# Patient Record
Sex: Male | Born: 1960
Health system: Southern US, Community
[De-identification: ages and names within clinical notes are randomized; demographics above are authoritative.]

## PROBLEM LIST (undated history)

## (undated) ENCOUNTER — Emergency Department (HOSPITAL_COMMUNITY): Admission: EM | Payer: Medicare Other | Source: Home / Self Care

## (undated) DIAGNOSIS — Z95810 Presence of automatic (implantable) cardiac defibrillator: Secondary | ICD-10-CM

## (undated) DIAGNOSIS — E785 Hyperlipidemia, unspecified: Secondary | ICD-10-CM

## (undated) DIAGNOSIS — I509 Heart failure, unspecified: Secondary | ICD-10-CM

## (undated) DIAGNOSIS — I213 ST elevation (STEMI) myocardial infarction of unspecified site: Secondary | ICD-10-CM

## (undated) DIAGNOSIS — J449 Chronic obstructive pulmonary disease, unspecified: Secondary | ICD-10-CM

## (undated) DIAGNOSIS — R51 Headache: Secondary | ICD-10-CM

## (undated) DIAGNOSIS — D649 Anemia, unspecified: Secondary | ICD-10-CM

## (undated) DIAGNOSIS — J189 Pneumonia, unspecified organism: Secondary | ICD-10-CM

## (undated) DIAGNOSIS — I255 Ischemic cardiomyopathy: Secondary | ICD-10-CM

## (undated) DIAGNOSIS — Z9981 Dependence on supplemental oxygen: Secondary | ICD-10-CM

## (undated) DIAGNOSIS — C3490 Malignant neoplasm of unspecified part of unspecified bronchus or lung: Secondary | ICD-10-CM

## (undated) DIAGNOSIS — R0602 Shortness of breath: Secondary | ICD-10-CM

## (undated) DIAGNOSIS — I219 Acute myocardial infarction, unspecified: Secondary | ICD-10-CM

## (undated) DIAGNOSIS — C61 Malignant neoplasm of prostate: Secondary | ICD-10-CM

## (undated) DIAGNOSIS — I209 Angina pectoris, unspecified: Secondary | ICD-10-CM

## (undated) DIAGNOSIS — I2510 Atherosclerotic heart disease of native coronary artery without angina pectoris: Secondary | ICD-10-CM

## (undated) DIAGNOSIS — R911 Solitary pulmonary nodule: Secondary | ICD-10-CM

## (undated) DIAGNOSIS — Z923 Personal history of irradiation: Secondary | ICD-10-CM

## (undated) DIAGNOSIS — A159 Respiratory tuberculosis unspecified: Secondary | ICD-10-CM

## (undated) HISTORY — DX: Personal history of irradiation: Z92.3

## (undated) HISTORY — DX: Hyperlipidemia, unspecified: E78.5

## (undated) HISTORY — PX: FRACTURE SURGERY: SHX138

## (undated) HISTORY — DX: Atherosclerotic heart disease of native coronary artery without angina pectoris: I25.10

## (undated) HISTORY — DX: Ischemic cardiomyopathy: I25.5

## (undated) HISTORY — DX: Chronic obstructive pulmonary disease, unspecified: J44.9

---

## 1989-01-28 DIAGNOSIS — J189 Pneumonia, unspecified organism: Secondary | ICD-10-CM

## 1989-01-28 HISTORY — DX: Pneumonia, unspecified organism: J18.9

## 1995-05-31 HISTORY — PX: CORONARY ARTERY BYPASS GRAFT: SHX141

## 2006-05-30 HISTORY — PX: FINGER FRACTURE SURGERY: SHX638

## 2008-05-30 DIAGNOSIS — I219 Acute myocardial infarction, unspecified: Secondary | ICD-10-CM

## 2008-05-30 HISTORY — DX: Acute myocardial infarction, unspecified: I21.9

## 2008-09-27 HISTORY — PX: CORONARY ANGIOPLASTY WITH STENT PLACEMENT: SHX49

## 2008-10-07 ENCOUNTER — Encounter: Payer: Self-pay | Admitting: Cardiology

## 2008-10-08 ENCOUNTER — Encounter: Payer: Self-pay | Admitting: Cardiology

## 2008-10-09 ENCOUNTER — Encounter: Payer: Self-pay | Admitting: Cardiology

## 2010-02-27 DIAGNOSIS — I213 ST elevation (STEMI) myocardial infarction of unspecified site: Secondary | ICD-10-CM

## 2010-02-27 HISTORY — DX: ST elevation (STEMI) myocardial infarction of unspecified site: I21.3

## 2010-02-27 HISTORY — PX: CORONARY ANGIOPLASTY WITH STENT PLACEMENT: SHX49

## 2010-03-13 ENCOUNTER — Encounter: Payer: Self-pay | Admitting: Cardiology

## 2010-03-14 ENCOUNTER — Encounter: Payer: Self-pay | Admitting: Cardiology

## 2010-03-15 ENCOUNTER — Encounter: Payer: Self-pay | Admitting: Cardiology

## 2010-03-16 ENCOUNTER — Encounter: Payer: Self-pay | Admitting: Cardiology

## 2010-03-17 ENCOUNTER — Encounter: Payer: Self-pay | Admitting: Cardiology

## 2011-08-12 ENCOUNTER — Ambulatory Visit (INDEPENDENT_AMBULATORY_CARE_PROVIDER_SITE_OTHER): Payer: Self-pay | Admitting: Cardiology

## 2011-08-12 ENCOUNTER — Encounter: Payer: Self-pay | Admitting: Cardiology

## 2011-08-12 DIAGNOSIS — E7800 Pure hypercholesterolemia, unspecified: Secondary | ICD-10-CM

## 2011-08-12 DIAGNOSIS — I2510 Atherosclerotic heart disease of native coronary artery without angina pectoris: Secondary | ICD-10-CM | POA: Insufficient documentation

## 2011-08-12 DIAGNOSIS — E785 Hyperlipidemia, unspecified: Secondary | ICD-10-CM | POA: Insufficient documentation

## 2011-08-12 DIAGNOSIS — J449 Chronic obstructive pulmonary disease, unspecified: Secondary | ICD-10-CM | POA: Insufficient documentation

## 2011-08-12 DIAGNOSIS — R042 Hemoptysis: Secondary | ICD-10-CM | POA: Insufficient documentation

## 2011-08-12 DIAGNOSIS — K920 Hematemesis: Secondary | ICD-10-CM

## 2011-08-12 DIAGNOSIS — Z720 Tobacco use: Secondary | ICD-10-CM | POA: Insufficient documentation

## 2011-08-12 DIAGNOSIS — I255 Ischemic cardiomyopathy: Secondary | ICD-10-CM | POA: Insufficient documentation

## 2011-08-12 DIAGNOSIS — I428 Other cardiomyopathies: Secondary | ICD-10-CM

## 2011-08-12 DIAGNOSIS — R079 Chest pain, unspecified: Secondary | ICD-10-CM | POA: Insufficient documentation

## 2011-08-12 MED ORDER — CARVEDILOL 3.125 MG PO TABS: 3.1250 mg | ORAL_TABLET | Freq: Two times a day (BID) | ORAL | Status: DC

## 2011-08-12 MED ORDER — LISINOPRIL 2.5 MG PO TABS: 2.5000 mg | ORAL_TABLET | Freq: Every day | ORAL | Status: DC

## 2011-08-12 MED ORDER — PRAVASTATIN SODIUM 40 MG PO TABS: 40.0000 mg | ORAL_TABLET | Freq: Every evening | ORAL | Status: DC

## 2011-08-12 NOTE — Assessment & Plan Note (Signed)
Plan add carvedilol 3.125 mg by mouth twice a day and lisinopril 2.5 mg daily. Check potassium and renal function in one week. Titrate medications as an outpatient. Once fully titrated he will need repeat echocardiogram to reassess LV function. If ejection fraction less than or equal to 35% he would benefit from ICD.

## 2011-08-12 NOTE — Assessment & Plan Note (Signed)
Recent problems with hemoptysis in the morning. Also with COPD and long history of tobacco use. I will arrange for a pulmonary evaluation. Check CBC. Patient will also be referred to a primary care physician.

## 2011-08-12 NOTE — Assessment & Plan Note (Signed)
Add Pravachol and check lipids and liver in 6 weeks.

## 2011-08-12 NOTE — Assessment & Plan Note (Signed)
Patient counseled on discontinuing. 

## 2011-08-12 NOTE — Assessment & Plan Note (Signed)
Continue aspirin and Plavix. Add statin.
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
## 2011-08-12 NOTE — Assessment & Plan Note (Signed)
Will ask pulmonary to evaluate.

## 2011-08-12 NOTE — Assessment & Plan Note (Signed)
Symptoms atypical. Schedule Myoview. 

## 2011-08-12 NOTE — Patient Instructions (Signed)
Your physician recommends that you schedule a follow-up appointment in: 8 WEEKS  Your physician has requested that you have a lexiscan myoview. For further information please visit [email or web address] Please follow instruction sheet, as given.SCHEDULE AT THE HOSP  START CARVEDILOL 3.125 MG ONE TABLET TWICE DAILY  START LISINOPRIL 2.5 MG ONCE DAILY  START PRAVASTATIN 40 MG ONCE DAILY AT BEDTIME  Your physician recommends that you return for lab work in: CBC/BMP IN ONE WEEK  Your physician recommends that you return for lab work in: LIPID/LIVER IN 6 WEEKS  REFERRAL TO PULMONARY FOR COPD AND HEMOPTYSIS  REFERRAL TO PRIMARY CARE TO ESTABLISH

## 2011-08-18 ENCOUNTER — Ambulatory Visit (HOSPITAL_COMMUNITY)
Admission: RE | Admit: 2011-08-18 | Discharge: 2011-08-18 | Disposition: A | Discharge status: Home / Self Care | Payer: Self-pay | Source: Ambulatory Visit | Attending: Cardiology | Admitting: Cardiology

## 2011-08-18 ENCOUNTER — Encounter (HOSPITAL_COMMUNITY)
Admission: RE | Admit: 2011-08-18 | Discharge: 2011-08-18 | Disposition: A | Discharge status: Home / Self Care | Payer: Self-pay | Source: Ambulatory Visit | Attending: Cardiology | Admitting: Cardiology

## 2011-08-18 DIAGNOSIS — I252 Old myocardial infarction: Secondary | ICD-10-CM | POA: Insufficient documentation

## 2011-08-18 DIAGNOSIS — J449 Chronic obstructive pulmonary disease, unspecified: Secondary | ICD-10-CM | POA: Insufficient documentation

## 2011-08-18 DIAGNOSIS — I2510 Atherosclerotic heart disease of native coronary artery without angina pectoris: Secondary | ICD-10-CM | POA: Insufficient documentation

## 2011-08-18 DIAGNOSIS — R0989 Other specified symptoms and signs involving the circulatory and respiratory systems: Secondary | ICD-10-CM | POA: Insufficient documentation

## 2011-08-18 DIAGNOSIS — E785 Hyperlipidemia, unspecified: Secondary | ICD-10-CM | POA: Insufficient documentation

## 2011-08-18 DIAGNOSIS — R079 Chest pain, unspecified: Secondary | ICD-10-CM | POA: Insufficient documentation

## 2011-08-18 DIAGNOSIS — Z951 Presence of aortocoronary bypass graft: Secondary | ICD-10-CM | POA: Insufficient documentation

## 2011-08-18 DIAGNOSIS — Z8249 Family history of ischemic heart disease and other diseases of the circulatory system: Secondary | ICD-10-CM | POA: Insufficient documentation

## 2011-08-18 DIAGNOSIS — R0609 Other forms of dyspnea: Secondary | ICD-10-CM | POA: Insufficient documentation

## 2011-08-18 DIAGNOSIS — F17200 Nicotine dependence, unspecified, uncomplicated: Secondary | ICD-10-CM | POA: Insufficient documentation

## 2011-08-18 DIAGNOSIS — R0602 Shortness of breath: Secondary | ICD-10-CM | POA: Insufficient documentation

## 2011-08-18 DIAGNOSIS — Z87891 Personal history of nicotine dependence: Secondary | ICD-10-CM | POA: Insufficient documentation

## 2011-08-18 DIAGNOSIS — J4489 Other specified chronic obstructive pulmonary disease: Secondary | ICD-10-CM | POA: Insufficient documentation

## 2011-08-18 DIAGNOSIS — I2589 Other forms of chronic ischemic heart disease: Secondary | ICD-10-CM | POA: Insufficient documentation

## 2011-08-18 LAB — BASIC METABOLIC PANEL
BUN: 8 mg/dL (ref 6–23)
CO2: 28 mEq/L (ref 19–32)
Chloride: 100 mEq/L (ref 96–112)
Glucose, Bld: 155 mg/dL — ABNORMAL HIGH (ref 70–99)
Potassium: 3.6 mEq/L (ref 3.5–5.1)

## 2011-08-18 LAB — CBC
HCT: 29.7 % — ABNORMAL LOW (ref 39.0–52.0)
Hemoglobin: 10.1 g/dL — ABNORMAL LOW (ref 13.0–17.0)
MCH: 22.8 pg — ABNORMAL LOW (ref 26.0–34.0)
MCHC: 34 g/dL (ref 30.0–36.0)
RDW: 17.6 % — ABNORMAL HIGH (ref 11.5–15.5)

## 2011-08-18 LAB — DIFFERENTIAL
Basophils Absolute: 0 10*3/uL (ref 0.0–0.1)
Basophils Relative: 0 % (ref 0–1)
Eosinophils Absolute: 0.4 10*3/uL (ref 0.0–0.7)
Eosinophils Relative: 3 % (ref 0–5)
Monocytes Absolute: 0.7 10*3/uL (ref 0.1–1.0)
Monocytes Relative: 5 % (ref 3–12)

## 2011-08-18 MED ORDER — TECHNETIUM TC 99M TETROFOSMIN IV KIT
30.0000 | PACK | Freq: Once | INTRAVENOUS | Status: AC | PRN
  Administered 2011-08-18: 30 via INTRAVENOUS

## 2011-08-18 MED ORDER — TECHNETIUM TC 99M TETROFOSMIN IV KIT
10.0000 | PACK | Freq: Once | INTRAVENOUS | Status: AC | PRN
  Administered 2011-08-18: 10 via INTRAVENOUS

## 2011-08-18 MED ORDER — REGADENOSON 0.4 MG/5ML IV SOLN
INTRAVENOUS | Status: AC
  Filled 2011-08-18: qty 5

## 2011-08-18 MED ORDER — REGADENOSON 0.4 MG/5ML IV SOLN
0.4000 mg | Freq: Once | INTRAVENOUS | Status: AC
  Administered 2011-08-18: 0.4 mg via INTRAVENOUS

## 2011-10-03 ENCOUNTER — Encounter: Payer: Self-pay | Admitting: Cardiology
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
## 2011-10-06 ENCOUNTER — Encounter: Payer: Self-pay | Admitting: Cardiology

## 2011-10-27 ENCOUNTER — Other Ambulatory Visit: Payer: Self-pay | Admitting: Cardiothoracic Surgery

## 2011-10-27 ENCOUNTER — Ambulatory Visit
Admission: RE | Admit: 2011-10-27 | Discharge: 2011-10-27 | Disposition: A | Discharge status: Home / Self Care | Payer: No Typology Code available for payment source | Source: Ambulatory Visit | Attending: Cardiology | Admitting: Cardiology

## 2011-10-27 ENCOUNTER — Ambulatory Visit (INDEPENDENT_AMBULATORY_CARE_PROVIDER_SITE_OTHER): Payer: No Typology Code available for payment source | Admitting: Cardiology

## 2011-10-27 ENCOUNTER — Other Ambulatory Visit: Payer: Self-pay | Admitting: *Deleted

## 2011-10-27 ENCOUNTER — Ambulatory Visit (INDEPENDENT_AMBULATORY_CARE_PROVIDER_SITE_OTHER): Payer: Self-pay | Admitting: Cardiothoracic Surgery

## 2011-10-27 ENCOUNTER — Encounter: Payer: Self-pay | Admitting: Cardiology

## 2011-10-27 ENCOUNTER — Inpatient Hospital Stay (HOSPITAL_COMMUNITY)
Admission: AD | Admit: 2011-10-27 | Discharge: 2011-11-03 | DRG: 167 | Disposition: A | Discharge status: Home / Self Care | Payer: MEDICAID | Source: Ambulatory Visit | Attending: Critical Care Medicine | Admitting: Critical Care Medicine

## 2011-10-27 ENCOUNTER — Inpatient Hospital Stay (HOSPITAL_COMMUNITY): Payer: Self-pay

## 2011-10-27 ENCOUNTER — Encounter (HOSPITAL_COMMUNITY): Payer: Self-pay | Admitting: General Practice

## 2011-10-27 VITALS — BP 88/63 | HR 100 | Resp 18 | Ht 67.0 in | Wt 120.0 lb

## 2011-10-27 VITALS — BP 90/62 | HR 117 | Temp 98.8°F | Ht 67.0 in | Wt 120.0 lb

## 2011-10-27 DIAGNOSIS — J449 Chronic obstructive pulmonary disease, unspecified: Secondary | ICD-10-CM | POA: Diagnosis present

## 2011-10-27 DIAGNOSIS — L089 Local infection of the skin and subcutaneous tissue, unspecified: Secondary | ICD-10-CM | POA: Insufficient documentation

## 2011-10-27 DIAGNOSIS — J850 Gangrene and necrosis of lung: Secondary | ICD-10-CM

## 2011-10-27 DIAGNOSIS — S298XXA Other specified injuries of thorax, initial encounter: Secondary | ICD-10-CM

## 2011-10-27 DIAGNOSIS — I2589 Other forms of chronic ischemic heart disease: Secondary | ICD-10-CM | POA: Diagnosis present

## 2011-10-27 DIAGNOSIS — I2510 Atherosclerotic heart disease of native coronary artery without angina pectoris: Secondary | ICD-10-CM

## 2011-10-27 DIAGNOSIS — I255 Ischemic cardiomyopathy: Secondary | ICD-10-CM | POA: Diagnosis present

## 2011-10-27 DIAGNOSIS — IMO0002 Reserved for concepts with insufficient information to code with codable children: Secondary | ICD-10-CM

## 2011-10-27 DIAGNOSIS — J439 Emphysema, unspecified: Secondary | ICD-10-CM | POA: Diagnosis present

## 2011-10-27 DIAGNOSIS — L02219 Cutaneous abscess of trunk, unspecified: Secondary | ICD-10-CM | POA: Diagnosis present

## 2011-10-27 DIAGNOSIS — L03319 Cellulitis of trunk, unspecified: Secondary | ICD-10-CM

## 2011-10-27 DIAGNOSIS — S21101A Unspecified open wound of right front wall of thorax without penetration into thoracic cavity, initial encounter: Secondary | ICD-10-CM

## 2011-10-27 DIAGNOSIS — J852 Abscess of lung without pneumonia: Principal | ICD-10-CM | POA: Diagnosis present

## 2011-10-27 DIAGNOSIS — D539 Nutritional anemia, unspecified: Secondary | ICD-10-CM

## 2011-10-27 DIAGNOSIS — L0390 Cellulitis, unspecified: Secondary | ICD-10-CM | POA: Diagnosis present

## 2011-10-27 DIAGNOSIS — T07XXXA Unspecified multiple injuries, initial encounter: Secondary | ICD-10-CM

## 2011-10-27 DIAGNOSIS — I428 Other cardiomyopathies: Secondary | ICD-10-CM | POA: Diagnosis present

## 2011-10-27 DIAGNOSIS — Z951 Presence of aortocoronary bypass graft: Secondary | ICD-10-CM

## 2011-10-27 DIAGNOSIS — J4489 Other specified chronic obstructive pulmonary disease: Secondary | ICD-10-CM | POA: Diagnosis present

## 2011-10-27 DIAGNOSIS — R918 Other nonspecific abnormal finding of lung field: Secondary | ICD-10-CM | POA: Diagnosis present

## 2011-10-27 DIAGNOSIS — F17200 Nicotine dependence, unspecified, uncomplicated: Secondary | ICD-10-CM | POA: Diagnosis present

## 2011-10-27 DIAGNOSIS — Z8611 Personal history of tuberculosis: Secondary | ICD-10-CM | POA: Diagnosis present

## 2011-10-27 DIAGNOSIS — D509 Iron deficiency anemia, unspecified: Secondary | ICD-10-CM | POA: Insufficient documentation

## 2011-10-27 DIAGNOSIS — L98499 Non-pressure chronic ulcer of skin of other sites with unspecified severity: Secondary | ICD-10-CM | POA: Diagnosis present

## 2011-10-27 DIAGNOSIS — Z720 Tobacco use: Secondary | ICD-10-CM | POA: Diagnosis present

## 2011-10-27 DIAGNOSIS — R590 Localized enlarged lymph nodes: Secondary | ICD-10-CM | POA: Diagnosis present

## 2011-10-27 DIAGNOSIS — I429 Cardiomyopathy, unspecified: Secondary | ICD-10-CM

## 2011-10-27 DIAGNOSIS — R7611 Nonspecific reaction to tuberculin skin test without active tuberculosis: Secondary | ICD-10-CM

## 2011-10-27 HISTORY — DX: Acute myocardial infarction, unspecified: I21.9

## 2011-10-27 HISTORY — DX: Anemia, unspecified: D64.9

## 2011-10-27 HISTORY — DX: Respiratory tuberculosis unspecified: A15.9

## 2011-10-27 HISTORY — DX: Pneumonia, unspecified organism: J18.9

## 2011-10-27 HISTORY — DX: ST elevation (STEMI) myocardial infarction of unspecified site: I21.3

## 2011-10-27 HISTORY — DX: Angina pectoris, unspecified: I20.9

## 2011-10-27 LAB — BASIC METABOLIC PANEL
Calcium: 8.8 mg/dL (ref 8.4–10.5)
GFR calc Af Amer: >90 mL/min (ref >90)
GFR calc non Af Amer: >90 mL/min (ref >90)
Glucose, Bld: 148 mg/dL — ABNORMAL HIGH (ref 70–99)
Sodium: 135 mEq/L (ref 135–145)

## 2011-10-27 LAB — CBC
Hemoglobin: 8.4 g/dL — ABNORMAL LOW (ref 13.0–17.0)
MCH: 21.7 pg — ABNORMAL LOW (ref 26.0–34.0)
MCHC: 33.9 g/dL (ref 30.0–36.0)
Platelets: 660 10*3/uL — ABNORMAL HIGH (ref 150–400)
RDW: 18.5 % — ABNORMAL HIGH (ref 11.5–15.5)

## 2011-10-27 MED ORDER — CEFEPIME HCL 1 G IJ SOLR
1.0000 g | Freq: Two times a day (BID) | INTRAMUSCULAR | Status: DC
  Administered 2011-10-27 – 2011-10-30 (×7): 1 g via INTRAVENOUS
  Filled 2011-10-27 (×10): qty 1

## 2011-10-27 MED ORDER — PANTOPRAZOLE SODIUM 40 MG PO TBEC
40.0000 mg | DELAYED_RELEASE_TABLET | Freq: Every day | ORAL | Status: DC
  Administered 2011-10-27 – 2011-11-03 (×7): 40 mg via ORAL
  Filled 2011-10-27 (×7): qty 1

## 2011-10-27 MED ORDER — VANCOMYCIN HCL 500 MG IV SOLR
500.0000 mg | Freq: Three times a day (TID) | INTRAVENOUS | Status: DC
  Administered 2011-10-28 – 2011-10-29 (×4): 500 mg via INTRAVENOUS
  Filled 2011-10-27 (×7): qty 500

## 2011-10-27 MED ORDER — VANCOMYCIN HCL IN DEXTROSE 1-5 GM/200ML-% IV SOLN
1000.0000 mg | Freq: Once | INTRAVENOUS | Status: AC
  Administered 2011-10-27: 1000 mg via INTRAVENOUS
  Filled 2011-10-27: qty 200

## 2011-10-27 MED ORDER — CARVEDILOL 3.125 MG PO TABS: 3.1250 mg | ORAL_TABLET | Freq: Two times a day (BID) | ORAL | Status: DC

## 2011-10-27 MED ORDER — TRAMADOL HCL 50 MG PO TABS
50.0000 mg | ORAL_TABLET | Freq: Four times a day (QID) | ORAL | Status: DC | PRN
  Filled 2011-10-27: qty 1

## 2011-10-27 MED ORDER — SIMVASTATIN 40 MG PO TABS: 40.0000 mg | ORAL_TABLET | Freq: Every day | ORAL | Status: DC

## 2011-10-27 MED ORDER — HEPARIN SODIUM (PORCINE) 5000 UNIT/ML IJ SOLN
5000.0000 [IU] | Freq: Three times a day (TID) | INTRAMUSCULAR | Status: DC
  Administered 2011-10-27 – 2011-10-31 (×11): 5000 [IU] via SUBCUTANEOUS
  Filled 2011-10-27 (×15): qty 1

## 2011-10-27 MED ORDER — TUBERCULIN PPD 5 UNIT/0.1ML ID SOLN
5.0000 [IU] | Freq: Once | INTRADERMAL | Status: AC
  Administered 2011-10-27: 5 [IU] via INTRADERMAL
  Filled 2011-10-27: qty 0.1

## 2011-10-27 MED ORDER — POTASSIUM CHLORIDE CRYS ER 20 MEQ PO TBCR
20.0000 meq | EXTENDED_RELEASE_TABLET | Freq: Once | ORAL | Status: AC
  Administered 2011-10-27: 20 meq via ORAL
  Filled 2011-10-27: qty 1

## 2011-10-27 MED ORDER — NICOTINE 7 MG/24HR TD PT24
7.0000 mg | MEDICATED_PATCH | Freq: Every day | TRANSDERMAL | Status: DC
  Administered 2011-10-27 – 2011-11-02 (×6): 7 mg via TRANSDERMAL
  Filled 2011-10-27 (×7): qty 1

## 2011-10-27 MED ORDER — ACETAMINOPHEN 325 MG PO TABS: 650.0000 mg | ORAL_TABLET | Freq: Four times a day (QID) | ORAL | Status: DC | PRN

## 2011-10-27 MED ORDER — IOHEXOL 300 MG/ML  SOLN
80.0000 mL | Freq: Once | INTRAMUSCULAR | Status: AC | PRN
  Administered 2011-10-27: 80 mL via INTRAVENOUS

## 2011-10-27 NOTE — Patient Instructions (Addendum)
Your physician recommends that you schedule a follow-up appointment in: 4 WEEKS WITH DR CRENSHAW  A chest x-ray takes a picture of the organs and structures inside the chest, including the heart, lungs, and blood vessels. This test can show several things, including, whether the heart is enlarges; whether fluid is building up in the lungs; and whether pacemaker / defibrillator leads are still in place. [place] IMAGING=1ST FLOOR  Your physician recommends that you HAVE LAB WORK TODAY  REFERRAL TO GI FOR MACROCYTIC ANEMIA  RESTART SIMVASTATIN 40 MG ONCE DAILY  RESTART CARVEDILOL 3.125 MG TWICE DAILY  REFERRAL TO [facility] TO [facility]LISH  GO OVER TO [facility] TO SEE DR ZOXWRUEA AFTER HAVING CXR=4TH FLOOR   REFERRAL TO PULMONARY FOR HEMOPYSIS

## 2011-10-27 NOTE — Assessment & Plan Note (Signed)
Patient counseled on discontinuing. 

## 2011-10-27 NOTE — Assessment & Plan Note (Signed)
Patient was noted previously to have a microcytic anemia. He has not followed up with a primary care physician. I will arrange a GI evaluation. I will try and arrange a new patient evaluation with [facility] for [facility] primary care needs.
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
## 2011-10-27 NOTE — Assessment & Plan Note (Signed)
Patient appears to have a small abscess lateral to his sternotomy as well as drainage from the superior aspect of the sternotomy. He denies fevers or chills and is not febrile in the office. Check CBC. Discussed with Dr. Tyrone Sage who will see today. Will check chest x-ray for stability of sternotomy.

## 2011-10-27 NOTE — Assessment & Plan Note (Signed)
Patient occasionally has blood streaked sputum. Will arrange evaluation with pulmonary.
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
## 2011-10-27 NOTE — Assessment & Plan Note (Signed)
We will add low-dose Coreg. We'll try and add low-dose ACE inhibitor in the future if his blood pressure allows. He may need an ICD in the future depending on followup EF. We would need to obviously treat his infection first.

## 2011-10-27 NOTE — Assessment & Plan Note (Signed)
Add Zocor 40 mg daily. Lipids and liver in 6 weeks.

## 2011-10-27 NOTE — Assessment & Plan Note (Signed)
Patient denies chest pain. Recent Myoview showed infarct with mild peri-infarct ischemia. Plan medical therapy. Continue aspirin. He is not taking previous medications. Will add Zocor 40 mg daily. Add Coreg 3.125 mg by mouth twice a day. We may be limited in terms of medical therapy as his blood pressure is borderline. We will add an ACE inhibitor in the future if his blood pressure allows.
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
## 2011-10-28 DIAGNOSIS — I2589 Other forms of chronic ischemic heart disease: Secondary | ICD-10-CM

## 2011-10-28 DIAGNOSIS — I2510 Atherosclerotic heart disease of native coronary artery without angina pectoris: Secondary | ICD-10-CM

## 2011-10-28 DIAGNOSIS — R599 Enlarged lymph nodes, unspecified: Secondary | ICD-10-CM

## 2011-10-28 DIAGNOSIS — R590 Localized enlarged lymph nodes: Secondary | ICD-10-CM | POA: Diagnosis present

## 2011-10-28 DIAGNOSIS — R042 Hemoptysis: Secondary | ICD-10-CM

## 2011-10-28 DIAGNOSIS — R918 Other nonspecific abnormal finding of lung field: Secondary | ICD-10-CM | POA: Diagnosis present

## 2011-10-28 DIAGNOSIS — R222 Localized swelling, mass and lump, trunk: Secondary | ICD-10-CM

## 2011-10-28 DIAGNOSIS — D509 Iron deficiency anemia, unspecified: Secondary | ICD-10-CM

## 2011-10-28 DIAGNOSIS — F17200 Nicotine dependence, unspecified, uncomplicated: Secondary | ICD-10-CM | POA: Diagnosis present

## 2011-10-28 DIAGNOSIS — L0291 Cutaneous abscess, unspecified: Secondary | ICD-10-CM

## 2011-10-28 LAB — FOLATE: Folate: 5.4 ng/mL

## 2011-10-28 LAB — HIV ANTIBODY (ROUTINE TESTING W REFLEX): HIV: NONREACTIVE

## 2011-10-28 LAB — STREP PNEUMONIAE URINARY ANTIGEN: Strep Pneumo Urinary Antigen: NEGATIVE

## 2011-10-28 LAB — IRON AND TIBC: UIBC: 125 ug/dL (ref 125–400)

## 2011-10-28 LAB — RETICULOCYTES
RBC.: 3.9 MIL/uL — ABNORMAL LOW (ref 4.22–5.81)
Retic Count, Absolute: 58.5 10*3/uL (ref 19.0–186.0)

## 2011-10-28 LAB — EXPECTORATED SPUTUM ASSESSMENT W GRAM STAIN, RFLX TO RESP C

## 2011-10-28 LAB — FERRITIN: Ferritin: 189 ng/mL (ref 22–322)

## 2011-10-28 MED ORDER — CARVEDILOL 3.125 MG PO TABS
3.1250 mg | ORAL_TABLET | Freq: Two times a day (BID) | ORAL | Status: DC
  Administered 2011-10-28 – 2011-10-31 (×8): 3.125 mg via ORAL
  Filled 2011-10-28 (×10): qty 1

## 2011-10-28 MED ORDER — SIMVASTATIN 40 MG PO TABS
40.0000 mg | ORAL_TABLET | Freq: Every day | ORAL | Status: DC
  Administered 2011-10-28 – 2011-11-02 (×6): 40 mg via ORAL
  Filled 2011-10-28 (×7): qty 1

## 2011-10-28 MED ORDER — ASPIRIN 81 MG PO CHEW
81.0000 mg | CHEWABLE_TABLET | Freq: Every day | ORAL | Status: DC
  Administered 2011-10-28 – 2011-11-03 (×6): 81 mg via ORAL
  Filled 2011-10-28 (×6): qty 1
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
## 2011-10-28 NOTE — Progress Notes (Signed)
[address]            [place] [address]          [phone]       [name] [facility] Medical Record #[id number] Date of Birth: [birth year]  Garrett Bunting, MD No primary provider on file.  Chief Complaint:   PostOp Follow Up Visit   History of Present Illness:     S/p cabg seen at request of Dr Jens Som for trauma to old sternotomy site. Patient has no way to purchase meds , so was admitted for iv antibiotic is and ct scan         History  Smoking status  . Current Everyday Smoker -- 0.1 packs/day for 34 years  . Types: Cigarettes  Smokeless tobacco  . Never Used       No Known Allergies  No current facility-administered medications for this visit.   No current outpatient prescriptions on file.   Facility-Administered Medications Ordered in Other Visits  Medication Dose Route Frequency Provider Last Rate Last Dose  . acetaminophen (TYLENOL) tablet 650 mg  650 mg Oral Q6H PRN Erin R Barrett, PA      . aspirin chewable tablet 81 mg  81 mg Oral Daily Garrett Bunting, MD   81 mg at 10/28/11 1048  . carvedilol (COREG) tablet 3.125 mg  3.125 mg Oral BID WC Garrett Bunting, MD   3.125 mg at 10/28/11 0839  . ceFEPIme (MAXIPIME) 1 g in dextrose 5 % 50 mL IVPB  1 g Intravenous Q12H [name], PHARMD   1 g at 10/28/11 1048  . heparin injection 5,000 Units  5,000 Units Subcutaneous Q8H [name], MD   5,000 Units at 10/28/11 1426  . iohexol (OMNIPAQUE) 300 MG/ML solution 80 mL  80 mL Intravenous Once PRN Medication Radiologist, MD   80 mL at 10/27/11 2040  . nicotine (NICODERM CQ - dosed in mg/24 hr) patch 7 mg  7 mg Transdermal Daily Erin R Barrett, PA   7 mg at 10/28/11 1049  . pantoprazole (PROTONIX) EC tablet 40 mg  40 mg Oral Q1200 [name], MD   40 mg at 10/28/11 1123  . potassium chloride SA (K-DUR,KLOR-CON) CR tablet 20 mEq  20 mEq Oral Once [name], MD   20 mEq at 10/27/11  2059  . simvastatin (ZOCOR) tablet 40 mg  40 mg Oral q1800 Garrett Bunting, MD      . traMADol [name]) tablet 50 mg  50 mg Oral Q6H PRN Erin R Barrett, PA      . tuberculin injection 5 Units  5 Units Intradermal Once [name], MD   5 Units at 10/27/11 2109  . vancomycin (VANCOCIN) 500 mg in sodium chloride 0.9 % 100 mL IVPB  500 mg Intravenous Q8H [name], PHARMD   500 mg at 10/28/11 1244  . vancomycin (VANCOCIN) IVPB 1000 mg/200 mL premix  1,000 mg Intravenous Once [employer], PHARMD   1,000 mg at 10/27/11 2132       Physical Exam: BP 88/63  Pulse 100  Resp 18  Ht 5\' 7"  (1.702 m)  Wt 120 lb (54.432 kg)  BMI 18.79 kg/m2  SpO2 98%  General appearance: cachectic, distracted and fatigued Neurologic: intact Heart: regular rate and rhythm, S1, S2 normal, no murmur,  click, rub or gallop Lungs: diminished breath sounds bilaterally Abdomen: soft, non-tender; bowel sounds normal; no masses,  no organomegaly Extremities: extremities normal, atraumatic, no cyanosis or edema Wounds:area  Left upper chest tender swollen with superficial break down of upper sternal wound  Diagnostic Studies & Laboratory data:         Recent Radiology Findings: Dg Chest 2 View  10/27/2011  *RADIOLOGY REPORT*  Clinical Data: Chest infection  CHEST - 2 VIEW  Comparison: None.  Findings:  The left heart border is obscured secondary to left upper and mid lung heterogeneous / consolidative opacities. Post coronary artery stenting.  There is left-sided volume loss with elevation of the left hemidiaphragm.  Left apical pleuroparenchymal thickening.  Ill- defined heterogeneous opacities are also seen within the right upper lung.  No definite pleural effusion or pneumothorax. Mild scoliotic curvature of the thoracolumbar spine, possibly positional.  IMPRESSION: Extensive left mid and upper lung predominant heterogeneous / consolidative opacities and volume loss with  additional opacities within the right upper lung.  In the absence of priors, the stability of these findings is indeterminate and an acute infectious process is not excluded.  Chronic etiologies such as sarcoidosis may have a similar appearance, though an underlying mass is not excluded.  Comparison to prior examinations is recommended.  If no comparisons exist, further evaluation with chest CT may be performed as clinically indicated.  Original Report Authenticated By: Waynard Reeds, M.D.   Ct Chest W Contrast  10/27/2011  *RADIOLOGY REPORT*  Clinical Data: Opening of previous sternotomy after being hit with baseball 3 weeks ago.  Soreness over the sternal area.  History of TB as a child.  CT CHEST WITH CONTRAST  Technique:  Multidetector CT imaging of the chest was performed following the standard protocol during bolus administration of intravenous contrast.  Contrast: 80mL OMNIPAQUE IOHEXOL 300 MG/ML  SOLN  Comparison: Chest radiograph 10/27/2011  Findings: Normal heart size.  Normal caliber thoracic aorta with mild calcification.  There are postoperative changes of median sternotomy.  There is increased density along what appears to be a left internal mammary graft which might represent calcification of the graft or stent.  There is calcification focally along the left heart border which might represent pericardial calcification or pleural calcification.  There is moderate lymphadenopathy in the anterior mediastinum and left aortopulmonic window, with lymph nodes measuring up to 15 mm short axis dimension. Mild left axillary lymphadenopathy.  The esophagus is decompressed.  There is volume loss in the left lung with diffuse left pleural thickening and near complete collapse or consolidation of the left lung with multiple air bronchograms and cystic changes.  There is aeration of a portion of the lung base with scarring.  These changes might reflect sequelae of old TB but active inflammation is not excluded  and correlation with any old outside studies would be useful.  There is compensatory hyperinflation of the right lung with diffuse emphysema and bullous changes in the apices.  There is focal mass-like area of consolidation in the posterior aspect of the right upper lung measuring about 3.5 x 3.8 cm.  This could represent mass or inflammatory process.  No pleural effusions.  No pneumothorax.  The sternum appears intact without evidence of acute fracture or bone destruction.  No significant retrosternal fluid or thickening. Mild skin thickening superficial to the sternum which might represent a hematoma were edema.  IMPRESSION: Extensive consolidation and volume loss throughout the left lung with apparent associated pleural thickening.  Left mediastinal lymphadenopathy.  Focal area of mass-like infiltration in the right upper lung.  The emphysematous changes.  Changes may be related to old TB but acute inflammatory or neoplastic process should be excluded.  Correlation with any old outside studies would be useful.  Otherwise, reactivation TB should be excluded.  Follow up after resolution of acute symptoms would be useful to exclude underlying mass lesion.  No retrosternal abscess or sternal bone destruction.  Original Report Authenticated By: Marlon Pel, M.D.      Recent Labs: Lab Results  Component Value Date   WBC 13.3* 10/27/2011   HGB 8.4* 10/27/2011   HCT 24.8* 10/27/2011   PLT 660* 10/27/2011   GLUCOSE 148* 10/27/2011   NA 135 10/27/2011   K 3.4* 10/27/2011   CL 99 10/27/2011   CREATININE 0.54 10/27/2011   BUN 11 10/27/2011   CO2 26 10/27/2011      Assessment / Plan:     See h& P dictation       Delight Ovens MD 10/28/2011 3:44 PM

## 2011-10-28 NOTE — Progress Notes (Signed)
@   Subjective:  Denies CP or dyspnea; chest "sore" at drainage site.   Objective:  Filed Vitals:   10/27/11 2325 10/28/11 0153 10/28/11 0359 10/28/11 0624  BP: 122/82 94/62 90/56    Pulse: 122 107 97 87  Temp: 99.4 F (37.4 C) 100.4 F (38 C) 98.5 F (36.9 C)   TempSrc: Oral Oral Oral   Resp: 20 18 18 20   Height:      Weight:      SpO2: 96% 95% 94% 98%    Intake/Output from previous day:  Intake/Output Summary (Last 24 hours) at 10/28/11 0700 Last data filed at 10/28/11 0600  Gross per 24 hour  Intake   1310 ml  Output    875 ml  Net    435 ml    Physical Exam: Physical exam: Well-developed thin in no acute distress.  Skin is warm and dry.  HEENT is normal.  Neck is supple.  Chest is clear to auscultation with normal expansion. Sternum with cellulitis Cardiovascular exam is regular rate and rhythm.  Abdominal exam nontender or distended. No masses palpated. Extremities show no edema. neuro grossly intact    Lab Results: Basic Metabolic Panel:  Basename 10/27/11 1732  NA 135  K 3.4*  CL 99  CO2 26  GLUCOSE 148*  BUN 11  CREATININE 0.54  CALCIUM 8.8  MG --  PHOS --   CBC:  Basename 10/27/11 1732  WBC 13.3*  NEUTROABS --  HGB 8.4*  HCT 24.8*  MCV 64.1*  PLT 660*     Assessment/Plan:  1) ICM- add ASA, statin and low dose coreg; will add ACEI later if BP allows (could be done as outpt). Will consider ICD in the future once lung/ID issues addressed and treated. 2) Sternal cellulitis - continue antibiotics 3) Pulmonary infiltrates - etiology unclear; continue antibiotics; pulmonary following; may need ID consult. 4) Severe microcytic anemia - check Fe studies and stool guaiac; will most likely need GI eval. Continue protonix. Patient will need close FU with a primary care physician following DC  Olga Millers 10/28/2011, 7:00 AM

## 2011-10-28 NOTE — Progress Notes (Addendum)
  Subjective:  Mr. Petta has no new complaints this morning.  He was able to produce a sputum sample this morning, which was acceptable for culture.   Objective: Vital signs in last 24 hours: Temp:  [98.2 F (36.8 C)-100.4 F (38 C)] 98.5 F (36.9 C) (05/31 0359) Pulse Rate:  [87-122] 87  (05/31 0624) Cardiac Rhythm:  [-] Normal sinus rhythm (05/31 0736) Resp:  [18-20] 20  (05/31 0624) BP: (88-122)/(56-82) 90/56 mmHg (05/31 0359) SpO2:  [90 %-98 %] 98 % (05/31 0624) FiO2 (%):  [2 %] 2 % (05/31 0153) Weight:  [119 lb 14.9 oz (54.4 kg)-120 lb (54.432 kg)] 119 lb 14.9 oz (54.4 kg) (05/30 1618) Intake/Output from previous day: 05/30 0701 - 05/31 0700 In: 1310 [P.O.:960; IV Piggyback:350] Out: 1150 [Urine:1150]  General appearance: alert, cooperative and no distress Heart: regular rate and rhythm, S1, S2 normal, no murmur, click, rub or gallop Lungs: clear to auscultation bilaterally Abdomen: soft, non-tender; bowel sounds normal; no masses,  no organomegaly Wound: small 2cm opening mid sternotomy incision, swollen, drainage present  Lab Results:  Basename 10/27/11 1732  WBC 13.3*  HGB 8.4*  HCT 24.8*  PLT 660*   BMET:  Basename 10/27/11 1732  NA 135  K 3.4*  CL 99  CO2 26  GLUCOSE 148*  BUN 11  CREATININE 0.54  CALCIUM 8.8    PT/INR: No results found for this basename: LABPROT,INR in the last 72 hours ABG No results found for this basename: phart, pco2, po2, hco3, tco2, acidbasedef, o2sat   CBG (last 3)  No results found for this basename: GLUCAP:3 in the last 72 hours  Assessment/Plan:  1.Chest cellulitis- on Vanc and Cefepime 2. Volume Loss and Pulmonary Infiltrate per CT scan- no identifiable source- appreciate pulmonary recommendations 3. Microcytic Anemia- hgb 8.4 this morning, will monitor patient with hemoptysis, CCM ordered occult blood checks 4. Dispo- will continue workup per CCM.  Will discuss with Dr. Tyrone Sage about transferring patient to CCM  service   LOS: 1 day    Lowella Dandy 10/28/2011  Have contacted ID and GI to see today Ok for CCM to assume on there service I have seen and examined Dustin Flock and agree with the above assessment  and plan.  Delight Ovens MD Beeper [phone] Office [phone] 10/28/2011 9:33 AM

## 2011-10-28 NOTE — Care Management Note (Unsigned)
    Page 1 of 1   10/28/2011     3:04:45 PM   CARE MANAGEMENT NOTE 10/28/2011  Patient:  [name], [name]   Account Number:  [id number]  Date Initiated:  10/28/2011  Documentation initiated by:  Ryatt Corsino  Subjective/Objective Assessment:   PT ADM WITH STERNAL CELLULITIS, PULMONARY INFILTRATE ON DROPLET PRECAUTIONS (R/O TB).  PTA, PT INDEPENDENT, FROM HOME.     Action/Plan:   CM REFERRAL FOR FINANCIAL ISSUES/MEDICATION CONCERNS   Anticipated DC Date:  11/01/2011   Anticipated DC Plan:  HOME W HOME HEALTH SERVICES      DC Planning Services  CM consult  [facility] / [facility] (established/new)      Choice offered to / List presented to:             Status of service:   Medicare Important Message given?   (If response is "NO", the following Medicare IM given date fields will be blank) Date Medicare IM given:   Date Additional Medicare IM given:    Discharge Disposition:    Per UR Regulation:    If discussed at Long Length of Stay Meetings, dates discussed:    Comments:  10/28/11 Thalya Fouche,RN,BSN 1340 PT STATES HE HAS BEEN OUT OF WORK FOR ABOUT 3 OR 4 YEARS. HE HAS APPLIED FOR MEDICAID, BUT HAS BEEN TURNED DOWN.  HE HAS NOT REAPPLIED.  HE STATES HE DOES NOT HAVE A PCP, AND HAS TROUBLE GETTING HIS MEDS FILLED.    WILL CONTACT FINANCIAL COUNSELOR TO EVALUATE/ASSIST WITH DISABILITY APPLICATION.  LEFT MESSAGE FOR DOROTHY TIMMONS WITH [facility] COMMUNITY CASE MANAGEMENT TO SEE IF THIS AGENCY CAN ASSIST PT WITH PCP/OBTAINING ORANGE CARD FOR HELP WITH RX.  WILL FOLLOW.

## 2011-10-29 LAB — ANGIOTENSIN CONVERTING ENZYME: Angiotensin-Converting Enzyme: 42 U/L (ref 8–52)

## 2011-10-29 LAB — BASIC METABOLIC PANEL
CO2: 25 mEq/L (ref 19–32)
Calcium: 9.3 mg/dL (ref 8.4–10.5)
Creatinine, Ser: 0.61 mg/dL (ref 0.50–1.35)
GFR calc non Af Amer: >90 mL/min (ref >90)
Glucose, Bld: 112 mg/dL — ABNORMAL HIGH (ref 70–99)

## 2011-10-29 LAB — CBC
MCH: 20.5 pg — ABNORMAL LOW (ref 26.0–34.0)
MCHC: 31.6 g/dL (ref 30.0–36.0)
MCV: 64.8 fL — ABNORMAL LOW (ref 78.0–100.0)
Platelets: 690 10*3/uL — ABNORMAL HIGH (ref 150–400)
RBC: 3.95 MIL/uL — ABNORMAL LOW (ref 4.22–5.81)

## 2011-10-29 MED ORDER — VANCOMYCIN HCL 1000 MG IV SOLR
750.0000 mg | Freq: Three times a day (TID) | INTRAVENOUS | Status: DC
  Administered 2011-10-29 – 2011-11-02 (×11): 750 mg via INTRAVENOUS
  Filled 2011-10-29 (×15): qty 750

## 2011-10-29 NOTE — Progress Notes (Signed)
Procedure(s) (LRB): VIDEO BRONCHOSCOPY WITHOUT FLUORO (Bilateral) Subjective: No complaints  Objective: Vital signs in last 24 hours: Temp:  [97.6 F (36.4 C)-98.4 F (36.9 C)] 98.4 F (36.9 C) (06/01 0521) Pulse Rate:  [79-93] 87  (06/01 0521) Cardiac Rhythm:  [-] Normal sinus rhythm (06/01 0908) Resp:  [18-20] 18  (06/01 0521) BP: (91-103)/(59-69) 97/63 mmHg (06/01 0521) SpO2:  [95 %-98 %] 95 % (06/01 0521) Weight:  [53.2 kg (117 lb 4.6 oz)-53.4 kg (117 lb 11.6 oz)] 53.2 kg (117 lb 4.6 oz) (06/01 0521)  Hemodynamic parameters for last 24 hours:    Intake/Output from previous day: 05/31 0701 - 06/01 0700 In: 1500 [P.O.:1200; IV Piggyback:300] Out: 850 [Urine:850] Intake/Output this shift: Total I/O In: 240 [P.O.:240] Out: 300 [Urine:300]  Wound: chest wound looks stable, surrounding erythema, small amount of drainage on dressing  Lab Results:  [facility] 10/29/11 0614 10/27/11 1732  WBC 12.7* 13.3*  HGB 8.1* 8.4*  HCT 25.6* 24.8*  PLT 690* 660*   BMET:  Basename 10/29/11 0614 10/27/11 1732  NA 135 135  K 4.3 3.4*  CL 100 99  CO2 25 26  GLUCOSE 112* 148*  BUN 11 11  CREATININE 0.61 0.54  CALCIUM 9.3 8.8    PT/INR: No results found for this basename: LABPROT,INR in the last 72 hours ABG No results found for this basename: phart, pco2, po2, hco3, tco2, acidbasedef, o2sat   CBG (last 3)  No results found for this basename: GLUCAP:3 in the last 72 hours  Assessment/Plan:  LOS: 2 days  Continue wound dressings, antibiotics  Armari Fussell K 10/29/2011

## 2011-10-29 NOTE — Progress Notes (Addendum)
INFECTIOUS DISEASE PROGRESS NOTE  ID: [name] is a 51 y.o. male with   Active Problems:  Ischemic cardiomyopathy  Tobacco abuse  COPD (chronic obstructive pulmonary disease)  Sternal wound infection  Pulmonary infiltrate  Cellulitis  Lung mass  Mediastinal lymphadenopathy  Smoker  Subjective: Without complaints  Abtx:  Anti-infectives     Start     Dose/Rate Route Frequency Ordered Stop   10/29/11 2000   vancomycin (VANCOCIN) 750 mg in sodium chloride 0.9 % 150 mL IVPB        750 mg 150 mL/hr over 60 Minutes Intravenous Every 8 hours 10/29/11 1234     10/28/11 0400   vancomycin (VANCOCIN) 500 mg in sodium chloride 0.9 % 100 mL IVPB  Status:  Discontinued        500 mg 100 mL/hr over 60 Minutes Intravenous Every 8 hours 10/27/11 1909 10/29/11 1234   10/27/11 1915   ceFEPIme (MAXIPIME) 1 g in dextrose 5 % 50 mL IVPB        1 g 100 mL/hr over 30 Minutes Intravenous Every 12 hours 10/27/11 1909     10/27/11 1800   vancomycin (VANCOCIN) IVPB 1000 mg/200 mL premix        1,000 mg 200 mL/hr over 60 Minutes Intravenous  Once 10/27/11 1721 10/27/11 2232          Medications:  Scheduled:   . aspirin  81 mg Oral Daily  . carvedilol  3.125 mg Oral BID WC  . ceFEPime (MAXIPIME) IV  1 g Intravenous Q12H  . heparin subcutaneous  5,000 Units Subcutaneous Q8H  . nicotine  7 mg Transdermal Daily  . pantoprazole  40 mg Oral Q1200  . simvastatin  40 mg Oral q1800  . vancomycin  750 mg Intravenous Q8H  . DISCONTD: vancomycin  500 mg Intravenous Q8H    Objective: Vital signs in last 24 hours: Temp:  [97.6 F (36.4 C)-98.4 F (36.9 C)] 98.4 F (36.9 C) (06/01 0521) Pulse Rate:  [79-93] 87  (06/01 0521) Resp:  [18-20] 18  (06/01 0521) BP: (91-103)/(59-69) 97/63 mmHg (06/01 0521) SpO2:  [95 %-98 %] 95 % (06/01 0521) Weight:  [53.2 kg (117 lb 4.6 oz)-53.4 kg (117 lb 11.6 oz)] 53.2 kg (117 lb 4.6 oz) (06/01 0521)   General appearance: alert, cooperative and no  distress Resp: clear to auscultation bilaterally Chest wall: slight decrease in d/c in drainage from wound. no change in size. Cardio: regular rate and rhythm GI: normal findings: bowel sounds normal and soft, non-tender  Lab Results  [facility] 10/29/11 0614 10/27/11 1732  WBC 12.7* 13.3*  HGB 8.1* 8.4*  HCT 25.6* 24.8*  NA 135 135  K 4.3 3.4*  CL 100 99  CO2 25 26  BUN 11 11  CREATININE 0.61 0.54  GLU -- --   Liver Panel No results found for this basename: PROT:2,ALBUMIN:2,AST:2,ALT:2,ALKPHOS:2,BILITOT:2,BILIDIR:2,IBILI:2 in the last 72 hours Sedimentation Rate No results found for this basename: ESRSEDRATE in the last 72 hours C-Reactive Protein No results found for this basename: CRP:2 in the last 72 hours  Microbiology: Recent Results (from the past 240 hour(s))  CULTURE, ROUTINE-ABSCESS     Status: Normal (Preliminary result)   Collection Time   10/27/11  2:30 PM      Component Value Range Status Comment   GRAM STAIN Abundant   Preliminary    GRAM STAIN WBC present-predominately PMN   Preliminary    GRAM STAIN No Squamous Epithelial Cells Seen   Preliminary  GRAM STAIN Few GRAM POSITIVE COCCI IN PAIRS   Preliminary    GRAM STAIN Moderate Gram Negative Rods   Preliminary   CULTURE, EXPECTORATED SPUTUM-ASSESSMENT     Status: Normal   Collection Time   10/28/11  7:01 AM      Component Value Range Status Comment   Specimen Description SPUTUM   Final    Special Requests NONE   Final    Sputum evaluation     Final    Value: THIS SPECIMEN IS ACCEPTABLE. RESPIRATORY CULTURE REPORT TO FOLLOW.   Report Status 10/28/2011 FINAL   Final   CULTURE, RESPIRATORY     Status: Normal (Preliminary result)   Collection Time   10/28/11  7:01 AM      Component Value Range Status Comment   Specimen Description SPUTUM   Final    Special Requests NONE   Final    Gram Stain     Final    Value: NO WBC SEEN     RARE SQUAMOUS EPITHELIAL CELLS PRESENT     NO ORGANISMS SEEN   Culture  PENDING   Incomplete    Report Status PENDING   Incomplete   WOUND CULTURE     Status: Normal (Preliminary result)   Collection Time   10/28/11 12:22 PM      Component Value Range Status Comment   Specimen Description WOUND STERNUM   Final    Special Requests NONE   Final    Gram Stain     Final    Value: FEW WBC PRESENT,BOTH PMN AND MONONUCLEAR     RARE SQUAMOUS EPITHELIAL CELLS PRESENT     NO ORGANISMS SEEN   Culture NO GROWTH   Final    Report Status PENDING   Incomplete     Studies/Results: Dg Chest 2 View  10/27/2011  *RADIOLOGY REPORT*  Clinical Data: Chest infection  CHEST - 2 VIEW  Comparison: None.  Findings:  The left heart border is obscured secondary to left upper and mid lung heterogeneous / consolidative opacities. Post coronary artery stenting.  There is left-sided volume loss with elevation of the left hemidiaphragm.  Left apical pleuroparenchymal thickening.  Ill- defined heterogeneous opacities are also seen within the right upper lung.  No definite pleural effusion or pneumothorax. Mild scoliotic curvature of the thoracolumbar spine, possibly positional.  IMPRESSION: Extensive left mid and upper lung predominant heterogeneous / consolidative opacities and volume loss with additional opacities within the right upper lung.  In the absence of priors, the stability of these findings is indeterminate and an acute infectious process is not excluded.  Chronic etiologies such as sarcoidosis may have a similar appearance, though an underlying mass is not excluded.  Comparison to prior examinations is recommended.  If no comparisons exist, further evaluation with chest CT may be performed as clinically indicated.  Original Report Authenticated By: Waynard Reeds, M.D.   Ct Chest W Contrast  10/27/2011  *RADIOLOGY REPORT*  Clinical Data: Opening of previous sternotomy after being hit with baseball 3 weeks ago.  Soreness over the sternal area.  History of TB as a child.  CT CHEST WITH  CONTRAST  Technique:  Multidetector CT imaging of the chest was performed following the standard protocol during bolus administration of intravenous contrast.  Contrast: 80mL OMNIPAQUE IOHEXOL 300 MG/ML  SOLN  Comparison: Chest radiograph 10/27/2011  Findings: Normal heart size.  Normal caliber thoracic aorta with mild calcification.  There are postoperative changes of median sternotomy.  There  is increased density along what appears to be a left internal mammary graft which might represent calcification of the graft or stent.  There is calcification focally along the left heart border which might represent pericardial calcification or pleural calcification.  There is moderate lymphadenopathy in the anterior mediastinum and left aortopulmonic window, with lymph nodes measuring up to 15 mm short axis dimension. Mild left axillary lymphadenopathy.  The esophagus is decompressed.  There is volume loss in the left lung with diffuse left pleural thickening and near complete collapse or consolidation of the left lung with multiple air bronchograms and cystic changes.  There is aeration of a portion of the lung base with scarring.  These changes might reflect sequelae of old TB but active inflammation is not excluded and correlation with any old outside studies would be useful.  There is compensatory hyperinflation of the right lung with diffuse emphysema and bullous changes in the apices.  There is focal mass-like area of consolidation in the posterior aspect of the right upper lung measuring about 3.5 x 3.8 cm.  This could represent mass or inflammatory process.  No pleural effusions.  No pneumothorax.  The sternum appears intact without evidence of acute fracture or bone destruction.  No significant retrosternal fluid or thickening. Mild skin thickening superficial to the sternum which might represent a hematoma were edema.  IMPRESSION: Extensive consolidation and volume loss throughout the left lung with apparent  associated pleural thickening.  Left mediastinal lymphadenopathy.  Focal area of mass-like infiltration in the right upper lung.  The emphysematous changes.  Changes may be related to old TB but acute inflammatory or neoplastic process should be excluded.  Correlation with any old outside studies would be useful.  Otherwise, reactivation TB should be excluded.  Follow up after resolution of acute symptoms would be useful to exclude underlying mass lesion.  No retrosternal abscess or sternal bone destruction.  Original Report Authenticated By: Marlon Pel, M.D.     Assessment/Plan:  Skin Abscess  Hx of Tb, ? Treatment Day 3 anbx- vanco/cefepime 1st wound Cx GNR/GPC on g/s 2nd wound Cx- no org on g/s Await Cx's. Await AFBs. BAL on 6-3? Contacted health dept, given how long ago he was tx, need to know what county he was in (and all records are in paper storage).   Johny Sax Infectious Diseases [phone] 10/29/2011, 12:58 PM   LOS: 2 days
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
## 2011-10-29 NOTE — Progress Notes (Signed)
Pt had PPD placed left forearm on 5/30- area circled. Area is raised more than 1 cm and slightly reddened.  Positive PPD.

## 2011-10-30 LAB — CULTURE, RESPIRATORY W GRAM STAIN
Culture: NORMAL
Gram Stain: NONE SEEN

## 2011-10-30 MED ORDER — PHENYLEPHRINE HCL 0.25 % NA SOLN
1.0000 | Freq: Four times a day (QID) | NASAL | Status: DC | PRN
  Filled 2011-10-30: qty 15

## 2011-10-30 MED ORDER — ZOLPIDEM TARTRATE 5 MG PO TABS
10.0000 mg | ORAL_TABLET | Freq: Every evening | ORAL | Status: DC | PRN
  Administered 2011-10-30: 10 mg via ORAL
  Filled 2011-10-30: qty 2
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
## 2011-10-31 ENCOUNTER — Inpatient Hospital Stay (HOSPITAL_COMMUNITY): Payer: Self-pay

## 2011-10-31 ENCOUNTER — Encounter (HOSPITAL_COMMUNITY): Payer: Self-pay | Admitting: Internal Medicine

## 2011-10-31 ENCOUNTER — Encounter (HOSPITAL_COMMUNITY)
Admission: AD | Disposition: A | Discharge status: Home / Self Care | Payer: Self-pay | Source: Ambulatory Visit | Attending: Critical Care Medicine

## 2011-10-31 DIAGNOSIS — J189 Pneumonia, unspecified organism: Secondary | ICD-10-CM

## 2011-10-31 DIAGNOSIS — Z8611 Personal history of tuberculosis: Secondary | ICD-10-CM

## 2011-10-31 HISTORY — PX: VIDEO BRONCHOSCOPY: SHX5072

## 2011-10-31 LAB — CULTURE, ROUTINE-ABSCESS
Gram Stain: NONE SEEN
Organism ID, Bacteria: NO GROWTH

## 2011-10-31 LAB — HEPATITIS PANEL, ACUTE
Hep A IgM: NEGATIVE
Hep B C IgM: NEGATIVE

## 2011-10-31 LAB — WOUND CULTURE

## 2011-10-31 SURGERY — VIDEO BRONCHOSCOPY WITHOUT FLUORO
Anesthesia: Moderate Sedation | Laterality: Bilateral

## 2011-10-31 MED ORDER — ASPIRIN 81 MG PO TABS: 81.0000 mg | ORAL_TABLET | Freq: Every day | ORAL | Status: DC

## 2011-10-31 MED ORDER — PHENYLEPHRINE HCL 0.25 % NA SOLN
NASAL | Status: DC | PRN
  Administered 2011-10-31: 1 via NASAL

## 2011-10-31 MED ORDER — SIMVASTATIN 40 MG PO TABS: 40.0000 mg | ORAL_TABLET | Freq: Every day | ORAL | Status: DC

## 2011-10-31 MED ORDER — LIDOCAINE HCL (PF) 1 % IJ SOLN: INTRAMUSCULAR | Status: DC | PRN

## 2011-10-31 MED ORDER — CARVEDILOL 3.125 MG PO TABS
3.1250 mg | ORAL_TABLET | Freq: Two times a day (BID) | ORAL | Status: DC
  Administered 2011-11-01 – 2011-11-03 (×5): 3.125 mg via ORAL
  Filled 2011-10-31 (×7): qty 1

## 2011-10-31 MED ORDER — FENTANYL CITRATE 0.05 MG/ML IJ SOLN
INTRAMUSCULAR | Status: AC
  Filled 2011-10-31: qty 4

## 2011-10-31 MED ORDER — MIDAZOLAM HCL 10 MG/2ML IJ SOLN
INTRAMUSCULAR | Status: DC | PRN
  Administered 2011-10-31 (×2): 2 mg via INTRAVENOUS

## 2011-10-31 MED ORDER — EZETIMIBE 10 MG PO TABS
10.0000 mg | ORAL_TABLET | Freq: Every day | ORAL | Status: DC
  Administered 2011-11-01 – 2011-11-03 (×3): 10 mg via ORAL
  Filled 2011-10-31 (×3): qty 1

## 2011-10-31 MED ORDER — FENTANYL CITRATE 0.05 MG/ML IJ SOLN
INTRAMUSCULAR | Status: DC | PRN
  Administered 2011-10-31: 50 ug via INTRAVENOUS
  Administered 2011-10-31: 25 ug via INTRAVENOUS
  Administered 2011-10-31: 50 ug via INTRAVENOUS

## 2011-10-31 MED ORDER — LIDOCAINE HCL 1 % IJ SOLN
INTRAMUSCULAR | Status: DC | PRN
  Administered 2011-10-31 (×2): 6 mL via RESPIRATORY_TRACT

## 2011-10-31 MED ORDER — MIDAZOLAM HCL 10 MG/2ML IJ SOLN
INTRAMUSCULAR | Status: AC
  Filled 2011-10-31: qty 4

## 2011-10-31 MED ORDER — BUTAMBEN-TETRACAINE-BENZOCAINE 2-2-14 % EX AERO
1.0000 | INHALATION_SPRAY | Freq: Once | CUTANEOUS | Status: DC
  Filled 2011-10-31: qty 56

## 2011-10-31 MED ORDER — SODIUM CHLORIDE 0.9 % IV SOLN
INTRAVENOUS | Status: DC
  Administered 2011-10-31: 08:00:00 via INTRAVENOUS

## 2011-10-31 MED ORDER — LIDOCAINE HCL 2 % EX GEL
1.0000 "application " | Freq: Once | CUTANEOUS | Status: DC
  Filled 2011-10-31: qty 5

## 2011-10-31 MED ORDER — LIDOCAINE HCL 2 % EX GEL
CUTANEOUS | Status: DC | PRN
  Administered 2011-10-31: 1

## 2011-10-31 MED ORDER — METOPROLOL SUCCINATE ER 25 MG PO TB24: 25.0000 mg | ORAL_TABLET | Freq: Every day | ORAL | Status: DC

## 2011-10-31 MED ORDER — PHENYLEPHRINE HCL 0.5 % NA SOLN: NASAL | Status: DC | PRN

## 2011-10-31 NOTE — Progress Notes (Signed)
Chart and bronch reviewed.  Awaiting results of bronch and biopsies Case reviewed and d/w Dr Rhea Belton  Please contact GI to work up microcytic anemia, though hemoptysis may be the culprit for this, when pulmonary status sorted out and he is fit enough for procedures.   Thank you,  Jennye Moccasin PA-C

## 2011-10-31 NOTE — Op Note (Signed)
Name:  [name] MRN:  [id number] DOB:  November 27, 1960  PROCEDURE NOTE  Procedure(s): Flexible bronchoscopy [phone]) Brushing (84696) of the RIGHT UPPER LOBE NODULAR LESION Bronchial alveolar lavage (29528) of the RIGHT UPPER LOBE POSTERIOR SEGMENT AND LEFT UPPER LOBE Transbronchial lung biopsy, single lobe (41324) of the RIGHT UPPER LOBE POSTERIOR SEGMENT LESION   Indications:   1. Smoker 2. Old pulmonary tuberculosis 3. Mediastinal node 4. Hemoptysis 5. Right upper lobe nodular lesion/mass 6. Left upper lobe cavitary lesion, bronchiectasis and fibrosis  Consent:  Procedure, benefits, risks and alternatives discussed.  Questions answered.  Consent obtained.  Anesthesia:   Moderate sedation  Procedure summary:  Appropriate equipment was assembled.  The patient was brought to the endoscopy suite and identified as [name].  Safety timeout was performed. The patient was placed supine on the operating table, airway established and moderate sedation administered by Anesthesia team.   After the appropriate level of sedation was assured, flexible video bronchoscope was lubricated and inserted through the oral route using bite block.  Total of 10 mL of 1% Lidocaine were administered through the bronchoscope initially  to augment sedation.  Airway examination was performed bilaterally to subsegmental level.  Minimal clear secretions were noted, mucosa appeared normal and no endobronchial lesions were identified.  THE ONLY ABNORMALITY WAS ON THE LEFT SIDE THE AIRWAY SHOWED SIGNS OF EXTRINSIC COMPRESSION FROM THE FIBROTIC PROCESS OF THE LUNG  Bronchial alveolar lavage of the LEFT UPPER LOBE USING 30CC X 3 SALINE PERFORMED WITH RETURNS THAT WERE CLOUDY PINK TOTAL 40CC. FOLLOWING THIS RIGHT MIDDLE LOBE BAL WAS PERFORMED 30CC X 2  performed with 100 mL of normal saline and return of 40 mL of fluid,   NEXT, UNDER FLUOROSCOPY GUIDANCE TRANSBRONCHIAL BRUSH FOR CYTOLOGY WAS DONE OF THE RIGHT UPPER  LOBE POSTERIOR SEGMENT. TOTAL 3 PASSES MADE  NEXT, UNDER FLUOROSCOPY TRANSBRONCHIAL BIOPSY OF THE RIGHT UPPER LOBE POSTERIOR SEGMENT WAS PERFORMED. TOTAL 4 BIOPSY ATTEMPTS DONE WITH RETURN OF MATERIAL. IT MUST BE NOTED THAT THE RUL NODULE WAS HARD TO VISUALIZE ON FLUORO. MINIMAL BLEEDING NOTED BUT PROPHYLACTIC EPINEPHRINE (DILUTED 1:50 OF 1:10,000, AND 10CC USED)  After hemostasis was assure, the bronchoscope was withdrawn.   Post-procedure chest x-ray was ordered.  Specimens sent: Bronchial alveolar lavage specimen of the  LEFT AND RIGHT UPPER LOBE BRUSH FOR CYTOLOGY AND AFB OF THE RIGHT UPPER LOBE TRANSBRONCHIAL BIOPSIES OF THE RIGHT UPPER LOBE   Complications:  No immediate complications were noted.  Hemodynamic parameters and oxygenation remained stable throughout the procedure.  Estimated blood loss:  Less then 2 mL.  POST PROCEDURE PLAN  - AWAIT BIOPSY RESULTS  - CHECK CXR  - RESUME HEPARIN ONLY ON 11/01/11  Dr. Kalman Shan, M.D., [facility].C.P Pulmonary and Critical Care Medicine Staff Physician [facility] Pager: [phone], If no answer or between  15:00h - 7:00h: call [phone]  10/31/2011 9:44 AM

## 2011-10-31 NOTE — Progress Notes (Signed)
UR Completed. Calie Buttrey, RN, Nurse Case Manager [phone]     

## 2011-10-31 NOTE — Progress Notes (Signed)
INFECTIOUS DISEASE PROGRESS NOTE  ID: [name] is a 51 y.o. male with   Active Problems:  Ischemic cardiomyopathy  Tobacco abuse  COPD (chronic obstructive pulmonary disease)  Sternal wound infection  Pulmonary infiltrate  Cellulitis  Lung mass  Mediastinal lymphadenopathy  Smoker  Subjective: Cont dry cough.  Abtx:  Anti-infectives     Start     Dose/Rate Route Frequency Ordered Stop   10/29/11 2000   vancomycin (VANCOCIN) 750 mg in sodium chloride 0.9 % 150 mL IVPB        750 mg 150 mL/hr over 60 Minutes Intravenous Every 8 hours 10/29/11 1234     10/28/11 0400   vancomycin (VANCOCIN) 500 mg in sodium chloride 0.9 % 100 mL IVPB  Status:  Discontinued        500 mg 100 mL/hr over 60 Minutes Intravenous Every 8 hours 10/27/11 1909 10/29/11 1234   10/27/11 1915   ceFEPIme (MAXIPIME) 1 g in dextrose 5 % 50 mL IVPB        1 g 100 mL/hr over 30 Minutes Intravenous Every 12 hours 10/27/11 1909     10/27/11 1800   vancomycin (VANCOCIN) IVPB 1000 mg/200 mL premix        1,000 mg 200 mL/hr over 60 Minutes Intravenous  Once 10/27/11 1721 10/27/11 2232          Medications:  Scheduled:   . aspirin  81 mg Oral Daily  . butamben-tetracaine-benzocaine  1 spray Topical Once  . carvedilol  3.125 mg Oral BID WC  . ceFEPime (MAXIPIME) IV  1 g Intravenous Q12H  . lidocaine  1 application Topical Once  . nicotine  7 mg Transdermal Daily  . pantoprazole  40 mg Oral Q1200  . simvastatin  40 mg Oral q1800  . vancomycin  750 mg Intravenous Q8H  . DISCONTD: heparin subcutaneous  5,000 Units Subcutaneous Q8H    Objective: Vital signs in last 24 hours: Temp:  [98.1 F (36.7 C)-98.6 F (37 C)] 98.6 F (37 C) (06/03 1500) Pulse Rate:  [83-96] 87  (06/03 1500) Resp:  [9-35] 22  (06/03 1500) BP: (92-129)/(54-104) 92/54 mmHg (06/03 1500) SpO2:  [86 %-100 %] 96 % (06/03 1500) Weight:  [53.4 kg (117 lb 11.6 oz)] 53.4 kg (117 lb 11.6 oz) (06/03 0527)   General appearance:  alert, cooperative and no distress Resp: diminished breath sounds anterior - right Chest wall: wound with continued d/c. decreased from previous. Cardio: regular rate and rhythm GI: normal findings: bowel sounds normal and soft, non-tender  Lab Results  Basename 10/29/11 0614  WBC 12.7*  HGB 8.1*  HCT 25.6*  NA 135  K 4.3  CL 100  CO2 25  BUN 11  CREATININE 0.61  GLU --   Liver Panel No results found for this basename: PROT:2,ALBUMIN:2,AST:2,ALT:2,ALKPHOS:2,BILITOT:2,BILIDIR:2,IBILI:2 in the last 72 hours Sedimentation Rate No results found for this basename: ESRSEDRATE in the last 72 hours C-Reactive Protein No results found for this basename: CRP:2 in the last 72 hours  Microbiology: Recent Results (from the past 240 hour(s))  CULTURE, ROUTINE-ABSCESS     Status: Normal   Collection Time   10/27/11  2:30 PM      Component Value Range Status Comment   GRAM STAIN Abundant   Final    GRAM STAIN WBC present-predominately PMN   Final    GRAM STAIN No Squamous Epithelial Cells Seen   Final    GRAM STAIN Few GRAM POSITIVE COCCI IN PAIRS  Final    GRAM STAIN Moderate Gram Negative Rods   Final    Organism ID, Bacteria NO GROWTH 3 DAYS   Final   AFB CULTURE WITH SMEAR     Status: Normal (Preliminary result)   Collection Time   10/28/11  7:01 AM      Component Value Range Status Comment   Specimen Description SPUTUM   Final    Special Requests NONE   Final    ACID FAST SMEAR NO ACID FAST BACILLI SEEN   Final    Culture     Final    Value: CULTURE WILL BE EXAMINED FOR 6 WEEKS BEFORE ISSUING A FINAL REPORT   Report Status PENDING   Incomplete   CULTURE, EXPECTORATED SPUTUM-ASSESSMENT     Status: Normal   Collection Time   10/28/11  7:01 AM      Component Value Range Status Comment   Specimen Description SPUTUM   Final    Special Requests NONE   Final    Sputum evaluation     Final    Value: THIS SPECIMEN IS ACCEPTABLE. RESPIRATORY CULTURE REPORT TO FOLLOW.   Report Status  10/28/2011 FINAL   Final   CULTURE, RESPIRATORY     Status: Normal   Collection Time   10/28/11  7:01 AM      Component Value Range Status Comment   Specimen Description SPUTUM   Final    Special Requests NONE   Final    Gram Stain     Final    Value: NO WBC SEEN     RARE SQUAMOUS EPITHELIAL CELLS PRESENT     NO ORGANISMS SEEN   Culture NORMAL OROPHARYNGEAL FLORA   Final    Report Status 10/30/2011 FINAL   Final   WOUND CULTURE     Status: Normal   Collection Time   10/28/11 12:22 PM      Component Value Range Status Comment   Specimen Description WOUND STERNUM   Final    Special Requests NONE   Final    Gram Stain     Final    Value: FEW WBC PRESENT,BOTH PMN AND MONONUCLEAR     RARE SQUAMOUS EPITHELIAL CELLS PRESENT     NO ORGANISMS SEEN   Culture NO GROWTH 2 DAYS   Final    Report Status 10/31/2011 FINAL   Final   AFB CULTURE WITH SMEAR     Status: Normal (Preliminary result)   Collection Time   10/28/11 12:22 PM      Component Value Range Status Comment   Specimen Description WOUND STERNUM   Final    Special Requests NONE   Final    ACID FAST SMEAR NO ACID FAST BACILLI SEEN   Final    Culture     Final    Value: CULTURE WILL BE EXAMINED FOR 6 WEEKS BEFORE ISSUING A FINAL REPORT   Report Status PENDING   Incomplete     Studies/Results: Dg Chest Port 1 View  10/31/2011  *RADIOLOGY REPORT*  Clinical Data: Post bronchoscopy.  Rule out pneumothorax.  PORTABLE CHEST - 1 VIEW  Comparison: Plain film and chest CT of 10/27/2011.  Findings: Prior median sternotomy.  Midline trachea.  Volume loss left hemithorax with elevation of the left hemidiaphragm. No pleural effusion or pneumothorax.  Progression of relatively diffuse left sided airspace disease.  Patchy right upper lobe airspace disease is also slightly increased.  IMPRESSION:  1. No pneumothorax. 2.  Worsened left greater than  right airspace opacities.  This remains suspicious for infection, possibly mycobacterial.  Plain film or  CT follow-up suggested to confirm resolution.  Original Report Authenticated By: Consuello Bossier, M.D.     Assessment/Plan: Skin Abscess  Hx of Tb, ? Treatment  Day 6 anbx- vanco/cefepime  1st wound Cx GNR/GPC on g/s  2nd wound Cx- no org on g/s    Both final (-). Await Cx's. Await AFBs from BAL on 6-3  Contacted health dept, given how long ago he was tx, need to know [place] (and all records are in paper storage). Not sure we will get answer from this. Will have to rely on BAL for answers.... Will d/c cefpime. If cont to be afebrile, stop vanco and change to po.  Johny Sax Infectious Diseases [phone] 10/31/2011, 4:22 PM   LOS: 4 days

## 2011-10-31 NOTE — Progress Notes (Signed)
Chart reviewed.  Ongoing eval of bilateral lung cavitary lesion/mass.  Bronchoscopy performed today with all results pending. We were consulted initially for iron def anemia, which may relate to pulmonary issues, hemoptysis, etc.   However, GI evaluation is reasonable, but would wait for acute pulmonary/infectious issues to be sorted out 1st Perhaps GI eval could proceed as outpt. We can discuss this again at that time. Please contact GI consult team when fit for EGD/colonoscopy.

## 2011-10-31 NOTE — Progress Notes (Addendum)
Video bronchoscopy procedure performed. BAL intervention performed, Biopsy intervention performed, BAL intervention performed, brushings intervention performed.

## 2011-10-31 NOTE — Clinical Social Work Psychosocial (Signed)
     Clinical Social Work Department BRIEF PSYCHOSOCIAL ASSESSMENT 10/31/2011  Patient:  [name], [name]     Account Number:  [id number]     Admit date:  10/27/2011  Clinical Social Worker:  Hulan Fray  Date/Time:  10/31/2011 04:14 PM  Referred by:  Physician  Date Referred:  10/28/2011 Referred for  Other - See comment   Other Referral:   "no money"   Interview type:  Patient Other interview type:    PSYCHOSOCIAL DATA Living Status:  FAMILY Admitted from facility:   Level of care:   Primary support name:  Nannie and Metha Primary support relationship to patient:  FAMILY Degree of support available:   adequate    CURRENT CONCERNS Current Concerns  Other - See comment   Other Concerns:   "I need the number for IRS"    SOCIAL WORK ASSESSMENT / PLAN Clinical Social Worker saw referral for patient that was put in 10/28/11. CSW spoke with patient to verify if his concern of not having money was addressed. Patient stated that "a lady came to see me when I was on the other unit." CSW reviewd chart and saw that CM, Raynelle Fanning addressed patient's financial and medication concerns. Patient stated that he lives with cousins. CSW provided patient with "[employer]. CSW asked if there were anything else of concern for him and he requested the number for IRS. CSW will provide number for patient. CSW will sign off as social work intervention is no longer needed.   Assessment/plan status:  No Further Intervention Required Other assessment/ plan:   Information/referral to community resources:   [employer] in [employer]    PATIENTS/FAMILYS RESPONSE TO PLAN OF CARE: Patient was appreciative of CSW assistance and the resource provided.
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
## 2011-10-31 NOTE — Progress Notes (Signed)
S/p Bronchoscopy today  Says he feels about the same  BP 92/54  Pulse 87  Temp(Src) 98.6 F (37 C) (Oral)  Resp 22  Ht 5\' 7"  (1.702 m)  Wt 117 lb 11.6 oz (53.4 kg)  BMI 18.44 kg/m2  SpO2 96%  Wound right chest changed, decreased surrounding cellulitis  CXR shows some progression on left  Await results of bronch

## 2011-10-31 NOTE — Interval H&P Note (Signed)
Since my last evaluation 3 days ago - case was reviewed and also d/w ID: goal is to rule out TB in LUL and RUL lesion and also do brush and TBBX of the RUL lesion in case it is cancer. No change in patient status. H&P < 30 days . Noted that heparin proph dose was administered this morning. So, we will be careful with biopsy and stop at any sign of bleeding. Have cold saline and topical epi handy

## 2011-11-01 DIAGNOSIS — J850 Gangrene and necrosis of lung: Secondary | ICD-10-CM | POA: Diagnosis present

## 2011-11-01 DIAGNOSIS — J852 Abscess of lung without pneumonia: Principal | ICD-10-CM

## 2011-11-01 DIAGNOSIS — J439 Emphysema, unspecified: Secondary | ICD-10-CM | POA: Diagnosis present

## 2011-11-01 DIAGNOSIS — R7611 Nonspecific reaction to tuberculin skin test without active tuberculosis: Secondary | ICD-10-CM

## 2011-11-01 DIAGNOSIS — I379 Nonrheumatic pulmonary valve disorder, unspecified: Secondary | ICD-10-CM

## 2011-11-01 DIAGNOSIS — Z8611 Personal history of tuberculosis: Secondary | ICD-10-CM | POA: Diagnosis present

## 2011-11-01 DIAGNOSIS — J438 Other emphysema: Secondary | ICD-10-CM

## 2011-11-01 DIAGNOSIS — L98499 Non-pressure chronic ulcer of skin of other sites with unspecified severity: Secondary | ICD-10-CM | POA: Diagnosis present

## 2011-11-01 LAB — GRAM STAIN

## 2011-11-01 LAB — AFB STAIN
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
## 2011-11-01 NOTE — Progress Notes (Signed)
INFECTIOUS DISEASE PROGRESS NOTE  ID: Tray Klayman is a 51 y.o. male with   Principal Problem:  *Necrotizing pneumonia Active Problems:  Coronary artery disease  Microcytic anemia  Smoker  Emphysema  Hemoptysis  Skin ulcer  PPD positive  Subjective: Without complaint, no cough, no cp.   Abtx:  Anti-infectives     Start     Dose/Rate Route Frequency Ordered Stop   10/29/11 2000   vancomycin (VANCOCIN) 750 mg in sodium chloride 0.9 % 150 mL IVPB        750 mg 150 mL/hr over 60 Minutes Intravenous Every 8 hours 10/29/11 1234     10/28/11 0400   vancomycin (VANCOCIN) 500 mg in sodium chloride 0.9 % 100 mL IVPB  Status:  Discontinued        500 mg 100 mL/hr over 60 Minutes Intravenous Every 8 hours 10/27/11 1909 10/29/11 1234   10/27/11 1915   ceFEPIme (MAXIPIME) 1 g in dextrose 5 % 50 mL IVPB  Status:  Discontinued        1 g 100 mL/hr over 30 Minutes Intravenous Every 12 hours 10/27/11 1909 10/31/11 1627   10/27/11 1800   vancomycin (VANCOCIN) IVPB 1000 mg/200 mL premix        1,000 mg 200 mL/hr over 60 Minutes Intravenous  Once 10/27/11 1721 10/27/11 2232          Medications:  Scheduled:   . aspirin  81 mg Oral Daily  . carvedilol  3.125 mg Oral BID WC  . ezetimibe  10 mg Oral Daily  . nicotine  7 mg Transdermal Daily  . pantoprazole  40 mg Oral Q1200  . simvastatin  40 mg Oral q1800  . vancomycin  750 mg Intravenous Q8H  . DISCONTD: aspirin  81 mg Oral Daily  . DISCONTD: butamben-tetracaine-benzocaine  1 spray Topical Once  . DISCONTD: carvedilol  3.125 mg Oral BID WC  . DISCONTD: ceFEPime (MAXIPIME) IV  1 g Intravenous Q12H  . DISCONTD: lidocaine  1 application Topical Once  . DISCONTD: metoprolol succinate  25 mg Oral Daily  . DISCONTD: simvastatin  40 mg Oral QHS  . DISCONTD: simvastatin  40 mg Oral QHS    Objective: Vital signs in last 24 hours: Temp:  [98.2 F (36.8 C)-98.6 F (37 C)] 98.4 F (36.9 C) (06/04 0341) Pulse Rate:  [83-88] 83   (06/04 0341) Resp:  [18-22] 18  (06/04 0341) BP: (92-101)/(54-65) 101/65 mmHg (06/04 0341) SpO2:  [94 %-96 %] 96 % (06/04 0341) Weight:  [53.5 kg (117 lb 15.1 oz)] 53.5 kg (117 lb 15.1 oz) (06/04 0341)   General appearance: alert, cooperative and no distress Resp: clear to auscultation bilaterally Chest wall: wound has no d/c. has some tenderness lateral to sternum. no gross fluctuance.  Cardio: regular rate and rhythm GI: normal findings: bowel sounds normal and soft, non-tender Extremities: edema none  Lab Results No results found for this basename: WBC:2,HGB:2,HCT:2,PLATELETS:2,NA:2,K:2,CL:2,CO2:2,BUN:2,CREATININE:2,GLU:2 in the last 72 hours Liver Panel No results found for this basename: PROT:2,ALBUMIN:2,AST:2,ALT:2,ALKPHOS:2,BILITOT:2,BILIDIR:2,IBILI:2 in the last 72 hours Sedimentation Rate No results found for this basename: ESRSEDRATE in the last 72 hours C-Reactive Protein No results found for this basename: CRP:2 in the last 72 hours  Microbiology: Recent Results (from the past 240 hour(s))  CULTURE, ROUTINE-ABSCESS     Status: Normal   Collection Time   10/27/11  2:30 PM      Component Value Range Status Comment   GRAM STAIN Abundant   Final  GRAM STAIN WBC present-predominately PMN   Final    GRAM STAIN No Squamous Epithelial Cells Seen   Final    GRAM STAIN Few GRAM POSITIVE COCCI IN PAIRS   Final    GRAM STAIN Moderate Gram Negative Rods   Final    Organism ID, Bacteria NO GROWTH 3 DAYS   Final   AFB CULTURE WITH SMEAR     Status: Normal (Preliminary result)   Collection Time   10/28/11  7:01 AM      Component Value Range Status Comment   Specimen Description SPUTUM   Final    Special Requests NONE   Final    ACID FAST SMEAR NO ACID FAST BACILLI SEEN   Final    Culture     Final    Value: CULTURE WILL BE EXAMINED FOR 6 WEEKS BEFORE ISSUING A FINAL REPORT   Report Status PENDING   Incomplete   CULTURE, EXPECTORATED SPUTUM-ASSESSMENT     Status: Normal    Collection Time   10/28/11  7:01 AM      Component Value Range Status Comment   Specimen Description SPUTUM   Final    Special Requests NONE   Final    Sputum evaluation     Final    Value: THIS SPECIMEN IS ACCEPTABLE. RESPIRATORY CULTURE REPORT TO FOLLOW.   Report Status 10/28/2011 FINAL   Final   CULTURE, RESPIRATORY     Status: Normal   Collection Time   10/28/11  7:01 AM      Component Value Range Status Comment   Specimen Description SPUTUM   Final    Special Requests NONE   Final    Gram Stain     Final    Value: NO WBC SEEN     RARE SQUAMOUS EPITHELIAL CELLS PRESENT     NO ORGANISMS SEEN   Culture NORMAL OROPHARYNGEAL FLORA   Final    Report Status 10/30/2011 FINAL   Final   WOUND CULTURE     Status: Normal   Collection Time   10/28/11 12:22 PM      Component Value Range Status Comment   Specimen Description WOUND STERNUM   Final    Special Requests NONE   Final    Gram Stain     Final    Value: FEW WBC PRESENT,BOTH PMN AND MONONUCLEAR     RARE SQUAMOUS EPITHELIAL CELLS PRESENT     NO ORGANISMS SEEN   Culture NO GROWTH 2 DAYS   Final    Report Status 10/31/2011 FINAL   Final   AFB CULTURE WITH SMEAR     Status: Normal (Preliminary result)   Collection Time   10/28/11 12:22 PM      Component Value Range Status Comment   Specimen Description WOUND STERNUM   Final    Special Requests NONE   Final    ACID FAST SMEAR NO ACID FAST BACILLI SEEN   Final    Culture     Final    Value: CULTURE WILL BE EXAMINED FOR 6 WEEKS BEFORE ISSUING A FINAL REPORT   Report Status PENDING   Incomplete   CULTURE, BAL-QUANTITATIVE     Status: Normal (Preliminary result)   Collection Time   10/31/11  9:33 AM      Component Value Range Status Comment   Specimen Description BRONCHIAL ALVEOLAR LAVAGE   Final    Special Requests LEFT UPPER LOBE   Final    Gram Stain  Final    Value: FEW WBC PRESENT,BOTH PMN AND MONONUCLEAR     NO SQUAMOUS EPITHELIAL CELLS SEEN     NO ORGANISMS SEEN   Colony  Count PENDING   Incomplete    Culture PENDING   Incomplete    Report Status PENDING   Incomplete   FUNGUS CULTURE W SMEAR     Status: Normal (Preliminary result)   Collection Time   10/31/11  9:33 AM      Component Value Range Status Comment   Specimen Description BRONCHIAL ALVEOLAR LAVAGE   Final    Special Requests LEFT UPPER LOBE   Final    Fungal Smear NO YEAST OR FUNGAL ELEMENTS SEEN   Final    Culture CULTURE IN PROGRESS FOR FOUR WEEKS   Final    Report Status PENDING   Incomplete   CULTURE, BAL-QUANTITATIVE     Status: Normal (Preliminary result)   Collection Time   10/31/11  9:35 AM      Component Value Range Status Comment   Specimen Description BRONCHIAL ALVEOLAR LAVAGE   Final    Special Requests RUL   Final    Gram Stain     Final    Value: FEW WBC PRESENT,BOTH PMN AND MONONUCLEAR     FEW SQUAMOUS EPITHELIAL CELLS PRESENT     RARE GRAM POSITIVE COCCI IN PAIRS     RARE GRAM NEGATIVE RODS   Colony Count PENDING   Incomplete    Culture PENDING   Incomplete    Report Status PENDING   Incomplete   FUNGUS CULTURE W SMEAR     Status: Normal (Preliminary result)   Collection Time   10/31/11  9:35 AM      Component Value Range Status Comment   Specimen Description BRONCHIAL ALVEOLAR LAVAGE   Final    Special Requests RUL   Final    Fungal Smear NO YEAST OR FUNGAL ELEMENTS SEEN   Final    Culture CULTURE IN PROGRESS FOR FOUR WEEKS   Final    Report Status PENDING   Incomplete   GRAM STAIN     Status: Normal   Collection Time   10/31/11  9:35 AM      Component Value Range Status Comment   Specimen Description BRONCHIAL BRUSHING   Final    Special Requests RUL 1 SLIDE FOR GRAM   Final    Gram Stain     Final    Value: ABUNDANT WBC PRESENT,BOTH PMN AND MONONUCLEAR     NO SQUAMOUS EPITHELIAL CELLS SEEN     FEW GRAM POSITIVE COCCI IN PAIRS   Report Status 11/01/2011 FINAL   Final     Studies/Results: Dg Chest Port 1 View  10/31/2011  *RADIOLOGY REPORT*  Clinical Data: Post  bronchoscopy.  Rule out pneumothorax.  PORTABLE CHEST - 1 VIEW  Comparison: Plain film and chest CT of 10/27/2011.  Findings: Prior median sternotomy.  Midline trachea.  Volume loss left hemithorax with elevation of the left hemidiaphragm. No pleural effusion or pneumothorax.  Progression of relatively diffuse left sided airspace disease.  Patchy right upper lobe airspace disease is also slightly increased.  IMPRESSION:  1. No pneumothorax. 2.  Worsened left greater than right airspace opacities.  This remains suspicious for infection, possibly mycobacterial.  Plain film or CT follow-up suggested to confirm resolution.  Original Report Authenticated By: Consuello Bossier, M.D.   Dg C-arm Bronchoscopy  10/31/2011  CLINICAL DATA: bronchoscopy   C-ARM BRONCHOSCOPY  Fluoroscopy was  utilized by the requesting physician.  No radiographic  interpretation.       Assessment/Plan: Skin Abscess  Hx of Tb, ? Treatment  Day 7 anbx- vanco  1st wound Cx GNR/GPC on g/s  2nd wound Cx- no org on g/s  Both final (-).  Await Cx's. Await AFBs from BAL on 6-3  Contacted health dept, given how long ago he was tx, [place] to know [place] (and all records are in paper storage). Not sure we will get answer from this. Will have to rely on BAL for answers....   I contacted micro/afb lab and have asked them to look for actinomyces from BAL. They assured me this would trigger the routine alarm from broth cx's.  Will stop vanco, change to bactrim. If afb from BAL (-) would consider d/c home with health dept or ID f/u   Johny Sax Infectious Diseases [phone] 11/01/2011, 1:29 PM   LOS: 5 days

## 2011-11-01 NOTE — Progress Notes (Signed)
  Echocardiogram 2D Echocardiogram has been performed.  [name] 11/01/2011, 4:01 PM

## 2011-11-01 NOTE — Progress Notes (Signed)
Chart reviewed.  Appears stable from cardiac standpoint. Needs echo to re-evaluate LVEF.   Will follow at a distance.   Cordelle Dahmen,MD 12:25 PM

## 2011-11-01 NOTE — Progress Notes (Signed)
   CARE MANAGEMENT NOTE 11/01/2011  Patient:  [name], [name]   Account Number:  [id number]  Date Initiated:  10/28/2011  Documentation initiated by:  AMERSON,JULIE  Subjective/Objective Assessment:   PT ADM WITH STERNAL CELLULITIS, PULMONARY INFILTRATE ON DROPLET PRECAUTIONS (R/O TB).  PTA, PT INDEPENDENT, FROM HOME.     Action/Plan:   CM REFERRAL FOR FINANCIAL ISSUES/MEDICATION CONCERNS   Anticipated DC Date:  11/01/2011   Anticipated DC Plan:  HOME W HOME HEALTH SERVICES      DC Planning Services  CM consult  [facility] / [facility] (established/new)  Medication Assistance      Choice offered to / List presented to:             Status of service:  In process, will continue to follow Medicare Important Message given?  NO (If response is "NO", the following Medicare IM given date fields will be blank) Date Medicare IM given:   Date Additional Medicare IM given:    Discharge Disposition:    Per UR Regulation:  Reviewed for med. necessity/level of care/duration of stay  If discussed at Long Length of Stay Meetings, dates discussed:    Comments:  11/01/11 1000 Spoke with pt. about medication assistance.  TC to pharmacy and pt. is eligilbe for the [employer].  If pt. needs antibiotic RX at discharge, the fund will provide the entire RX.  Pt. awaiting results of Bronch. Tera Mater, RN, BSN NCM [phone]   10/28/11 JULIE AMERSON,RN,BSN [id number] PT STATES HE HAS BEEN OUT OF WORK FOR ABOUT 3 OR 4 YEARS. HE HAS APPLIED FOR MEDICAID, BUT HAS BEEN TURNED DOWN.  HE HAS NOT REAPPLIED.  HE STATES HE DOES NOT HAVE A PCP, AND HAS TROUBLE GETTING HIS MEDS FILLED.    WILL CONTACT FINANCIAL COUNSELOR TO EVALUATE/ASSIST WITH DISABILITY APPLICATION.  LEFT MESSAGE FOR DOROTHY TIMMONS WITH [facility] COMMUNITY CASE MANAGEMENT TO SEE IF THIS AGENCY CAN ASSIST PT WITH PCP/OBTAINING ORANGE CARD FOR HELP WITH RX.  WILL FOLLOW.

## 2011-11-01 NOTE — Progress Notes (Signed)
Name: [name] MRN: [id number] DOB: [birth year]    LOS: 5  Referring Provider:  Dr. Tyrone Sage Reason for Referral:  Abnormal chest x-ray  PULMONARY / CRITICAL CARE MEDICINE  Pt profile: 50 yobm smoker with history of + PPD and CAD admitted for evaluation severe necrotizing process of L lung - presumed infectious. Also ulcerated lesion overlying the superior aspect of his sternum with purulent drainage. Underwent FOB 6/3  SUBJ: no new complaints. No distress   Vital Signs: Temp:  [98.2 F (36.8 C)-98.6 F (37 C)] 98.4 F (36.9 C) (06/04 0341) Pulse Rate:  [83-88] 83  (06/04 0341) Resp:  [18-22] 18  (06/04 0341) BP: (92-101)/(54-65) 101/65 mmHg (06/04 0341) SpO2:  [94 %-96 %] 96 % (06/04 0341) Weight:  [53.5 kg (117 lb 15.1 oz)] 53.5 kg (117 lb 15.1 oz) (06/04 0341)  Physical Examination: General:  No acute distress. Looks chronically unwell Neuro:  Moves all fours HEENT:  Mucous membranes moist no lesions Neck:  Neck supple no thyromegaly no jugular venous distention Cardiovascular:  Regular rate and rhythm, normal S1-S2 no S3 or S4 Lungs:  Distant breath sounds on the left w/ rales, crackles; tender anterior chest wall and the upper lateral sternum with swollen draining lesion and surrounding erythema Abdomen:  Soft nontender bowel sounds active Musculoskeletal:  Full range of motion Skin:  Clear except for anterior chest wall ulceration   Patient Active Hospital Problem List: Necrotizing pneumonia (11/01/2011) with hemoptysis and hx of + PPD   Assessment: Likely infectious. Concern for TB vs actinomycosis, etc   Plan: Follow up FOB specimens. ID managing abx  Skin ulcer (11/01/2011)   Assessment: concern for infectious process. Wound GS and AFB smear negative   Plan: Cont current abx  Coronary artery disease (08/12/2011)   Assessment: No evidence of active ischemia.    Plan: Cont current Rx  Microcytic anemia (10/27/2011)   Assessment: Suspect due to chronic  hemoptysis   Plan: Monitor CBC intermittently  FeSO4  Consider GI eval at later date  Emphysema (11/01/2011), Smoker (10/28/2011)   Assessment: L lung is essentially totally nonfunctional and there are early emphysematous changes in R lung. Therefore, it is essential that he stop smoking now.    Plan: I have counseled to this effect. Smoking cessation consultation requested   Discussed with Dr Dolores Lory, MD;  PCCM service; Mobile [phone]

## 2011-11-02 ENCOUNTER — Inpatient Hospital Stay (HOSPITAL_COMMUNITY): Payer: Self-pay

## 2011-11-02 DIAGNOSIS — I255 Ischemic cardiomyopathy: Secondary | ICD-10-CM | POA: Diagnosis present

## 2011-11-02 LAB — CULTURE, BAL-QUANTITATIVE W GRAM STAIN: Colony Count: 35000

## 2011-11-02 MED ORDER — SULFAMETHOXAZOLE-TMP DS 800-160 MG PO TABS
1.0000 | ORAL_TABLET | Freq: Two times a day (BID) | ORAL | Status: DC
  Administered 2011-11-02 – 2011-11-03 (×2): 1 via ORAL
  Filled 2011-11-02 (×3): qty 1

## 2011-11-02 MED ORDER — FERROUS SULFATE 325 (65 FE) MG PO TABS
325.0000 mg | ORAL_TABLET | Freq: Two times a day (BID) | ORAL | Status: DC
  Administered 2011-11-02 – 2011-11-03 (×2): 325 mg via ORAL
  Filled 2011-11-02 (×4): qty 1

## 2011-11-02 NOTE — Progress Notes (Signed)
Dr. Kalman Shan, M.D., [facility]P Pulmonary and Critical Care Medicine Staff Physician  System [place] [facility] Pager: [phone], If no answer or between  15:00h - 7:00h: call [phone]  11/02/2011 10:57 AM

## 2011-11-02 NOTE — Progress Notes (Addendum)
Name: [name] MRN: [id number] DOB: 03/27/1961    LOS: 6  Referring Provider:  Dr. Tyrone Sage Reason for Referral:  Abnormal chest x-ray  PULMONARY / CRITICAL CARE MEDICINE  Pt profile: 50 yobm smoker with history of + PPD and CAD admitted for evaluation severe necrotizing process of L lung - presumed infectious. Also ulcerated lesion overlying the superior aspect of his sternum with purulent drainage.   FOB 6/3 >> 35k cfu nonpathogenic organisms  AFB smear  >> NEG on both BAL, brushings  Fungal smear >> NEG  AFB cx >>   Fungal cx >>   Echo 6/4: The cavity size was severely dilated. Wall thickness was increased in a pattern of mild LVH. Systolic function was severely reduced. The estimated ejection fraction was in the range of 25% to 30%. Diffuse hypokinesis.   SUBJ: no new complaints. No distress  Vital Signs: Temp:  [98.4 F (36.9 C)-98.9 F (37.2 C)] 98.9 F (37.2 C) (06/05 4098) Pulse Rate:  [81-92] 81  (06/05 0638) Resp:  [18] 18  (06/04 2100) BP: (100-104)/(64-65) 100/65 mmHg (06/05 0638) SpO2:  [96 %-99 %] 96 % (06/05 0638) Weight:  [52.753 kg (116 lb 4.8 oz)] 52.753 kg (116 lb 4.8 oz) (06/05 1191)  Physical Examination: General:  No acute distress. Looks chronically unwell Neuro:  Moves all fours HEENT:  Mucous membranes moist no lesions Neck:  Neck supple no thyromegaly no jugular venous distention Cardiovascular:  Regular rate and rhythm, normal S1-S2 no S3 or S4 Lungs:  Distant breath sounds on the left w/ rales, crackles; tender anterior chest wall and the upper lateral sternum with swollen draining lesion and surrounding erythema Abdomen:  Soft nontender bowel sounds active Musculoskeletal:  Full range of motion Skin:  Clear except for anterior chest wall ulceration   Patient Active Hospital Problem List: Necrotizing pneumonia (11/01/2011) with hemoptysis and hx of + PPD   Assessment: Likely infectious. Concern for TB vs actinomyces, etc   Plan: Follow  up FOB specimens. ID managing abx  D/C resp isolation as all AFB smears are negative  Recheck PA/lat CXR  Skin ulcer (11/01/2011)   Assessment: concern for infectious process. Wound GS and AFB smear negative   Plan: Cont current abx  Coronary artery disease (08/12/2011) Severe dilated CM with EF 25-30%   Assessment: Compensated. No evidence of active ischemia.    Plan: Cont current Rx. Cards following  Microcytic anemia (10/27/2011)   Assessment: Suspect due to chronic hemoptysis   Plan: Monitor CBC intermittently  FeSO4  CBC AM 6/6  Consider GI eval at later date  Emphysema (11/01/2011), Smoker (10/28/2011)   Assessment: L lung is essentially totally nonfunctional and there are early emphysematous changes in R lung.    Plan: I have counseled. Smoking cessation consultation requested      Billy Fischer, MD;  PCCM service; Mobile [phone]  Possible DC home 6/6 if OK with ID. Will need F/U with Dr Marchelle Gearing after discharge

## 2011-11-02 NOTE — Progress Notes (Signed)
INFECTIOUS DISEASE PROGRESS NOTE  ID: Osborne Serio is a 51 y.o. male with   Principal Problem:  *Necrotizing pneumonia Active Problems:  Coronary artery disease  Microcytic anemia  Smoker  Emphysema  Hemoptysis  Skin ulcer  PPD positive  Cardiomyopathy  Subjective: Without complaints  Abtx:  Anti-infectives     Start     Dose/Rate Route Frequency Ordered Stop   10/29/11 2000   vancomycin (VANCOCIN) 750 mg in sodium chloride 0.9 % 150 mL IVPB        750 mg 150 mL/hr over 60 Minutes Intravenous Every 8 hours 10/29/11 1234     10/28/11 0400   vancomycin (VANCOCIN) 500 mg in sodium chloride 0.9 % 100 mL IVPB  Status:  Discontinued        500 mg 100 mL/hr over 60 Minutes Intravenous Every 8 hours 10/27/11 1909 10/29/11 1234   10/27/11 1915   ceFEPIme (MAXIPIME) 1 g in dextrose 5 % 50 mL IVPB  Status:  Discontinued        1 g 100 mL/hr over 30 Minutes Intravenous Every 12 hours 10/27/11 1909 10/31/11 1627   10/27/11 1800   vancomycin (VANCOCIN) IVPB 1000 mg/200 mL premix        1,000 mg 200 mL/hr over 60 Minutes Intravenous  Once 10/27/11 1721 10/27/11 2232          Medications:  Scheduled:   . aspirin  81 mg Oral Daily  . carvedilol  3.125 mg Oral BID WC  . ezetimibe  10 mg Oral Daily  . ferrous sulfate  325 mg Oral BID WC  . pantoprazole  40 mg Oral Q1200  . simvastatin  40 mg Oral q1800  . vancomycin  750 mg Intravenous Q8H  . DISCONTD: nicotine  7 mg Transdermal Daily    Objective: Vital signs in last 24 hours: Temp:  [98.5 F (36.9 C)-98.9 F (37.2 C)] 98.5 F (36.9 C) (06/05 1400) Pulse Rate:  [81-92] 89  (06/05 1400) Resp:  [18-20] 20  (06/05 1400) BP: (96-104)/(62-65) 96/62 mmHg (06/05 1400) SpO2:  [96 %-99 %] 99 % (06/05 1400) Weight:  [52.753 kg (116 lb 4.8 oz)] 52.753 kg (116 lb 4.8 oz) (06/05 4782)   Resp: clear to auscultation bilaterally Chest wall: wound is ~ 1 cm. no d/c expressable. tender.  Cardio: regular rate and rhythm GI: normal  findings: bowel sounds normal and soft, non-tender  Lab Results No results found for this basename: WBC:2,HGB:2,HCT:2,PLATELETS:2,NA:2,K:2,CL:2,CO2:2,BUN:2,CREATININE:2,GLU:2 in the last 72 hours Liver Panel No results found for this basename: PROT:2,ALBUMIN:2,AST:2,ALT:2,ALKPHOS:2,BILITOT:2,BILIDIR:2,IBILI:2 in the last 72 hours Sedimentation Rate No results found for this basename: ESRSEDRATE in the last 72 hours C-Reactive Protein No results found for this basename: CRP:2 in the last 72 hours  Microbiology: Recent Results (from the past 240 hour(s))  CULTURE, ROUTINE-ABSCESS     Status: Normal   Collection Time   10/27/11  2:30 PM      Component Value Range Status Comment   GRAM STAIN Abundant   Final    GRAM STAIN WBC present-predominately PMN   Final    GRAM STAIN No Squamous Epithelial Cells Seen   Final    GRAM STAIN Few GRAM POSITIVE COCCI IN PAIRS   Final    GRAM STAIN Moderate Gram Negative Rods   Final    Organism ID, Bacteria NO GROWTH 3 DAYS   Final   AFB CULTURE WITH SMEAR     Status: Normal (Preliminary result)   Collection Time   10/28/11  7:01 AM      Component Value Range Status Comment   Specimen Description SPUTUM   Final    Special Requests NONE   Final    ACID FAST SMEAR NO ACID FAST BACILLI SEEN   Final    Culture     Final    Value: CULTURE WILL BE EXAMINED FOR 6 WEEKS BEFORE ISSUING A FINAL REPORT   Report Status PENDING   Incomplete   CULTURE, EXPECTORATED SPUTUM-ASSESSMENT     Status: Normal   Collection Time   10/28/11  7:01 AM      Component Value Range Status Comment   Specimen Description SPUTUM   Final    Special Requests NONE   Final    Sputum evaluation     Final    Value: THIS SPECIMEN IS ACCEPTABLE. RESPIRATORY CULTURE REPORT TO FOLLOW.   Report Status 10/28/2011 FINAL   Final   CULTURE, RESPIRATORY     Status: Normal   Collection Time   10/28/11  7:01 AM      Component Value Range Status Comment   Specimen Description SPUTUM   Final     Special Requests NONE   Final    Gram Stain     Final    Value: NO WBC SEEN     RARE SQUAMOUS EPITHELIAL CELLS PRESENT     NO ORGANISMS SEEN   Culture NORMAL OROPHARYNGEAL FLORA   Final    Report Status 10/30/2011 FINAL   Final   WOUND CULTURE     Status: Normal   Collection Time   10/28/11 12:22 PM      Component Value Range Status Comment   Specimen Description WOUND STERNUM   Final    Special Requests NONE   Final    Gram Stain     Final    Value: FEW WBC PRESENT,BOTH PMN AND MONONUCLEAR     RARE SQUAMOUS EPITHELIAL CELLS PRESENT     NO ORGANISMS SEEN   Culture NO GROWTH 2 DAYS   Final    Report Status 10/31/2011 FINAL   Final   AFB CULTURE WITH SMEAR     Status: Normal (Preliminary result)   Collection Time   10/28/11 12:22 PM      Component Value Range Status Comment   Specimen Description WOUND STERNUM   Final    Special Requests NONE   Final    ACID FAST SMEAR NO ACID FAST BACILLI SEEN   Final    Culture     Final    Value: CULTURE WILL BE EXAMINED FOR 6 WEEKS BEFORE ISSUING A FINAL REPORT   Report Status PENDING   Incomplete   AFB CULTURE WITH SMEAR     Status: Normal (Preliminary result)   Collection Time   10/31/11  9:33 AM      Component Value Range Status Comment   Specimen Description BRONCHIAL ALVEOLAR LAVAGE   Final    Special Requests LEFT UPPER LOBE   Final    ACID FAST SMEAR NO ACID FAST BACILLI SEEN   Final    Culture     Final    Value: CULTURE WILL BE EXAMINED FOR 6 WEEKS BEFORE ISSUING A FINAL REPORT   Report Status PENDING   Incomplete   CULTURE, BAL-QUANTITATIVE     Status: Normal   Collection Time   10/31/11  9:33 AM      Component Value Range Status Comment   Specimen Description BRONCHIAL ALVEOLAR LAVAGE   Final  Special Requests LEFT UPPER LOBE   Final    Gram Stain     Final    Value: FEW WBC PRESENT,BOTH PMN AND MONONUCLEAR     NO SQUAMOUS EPITHELIAL CELLS SEEN     NO ORGANISMS SEEN   Colony Count 15,000 COLONIES/ML   Final    Culture  Non-Pathogenic Oropharyngeal-type Flora Isolated.   Final    Report Status 11/02/2011 FINAL   Final   FUNGUS CULTURE W SMEAR     Status: Normal (Preliminary result)   Collection Time   10/31/11  9:33 AM      Component Value Range Status Comment   Specimen Description BRONCHIAL ALVEOLAR LAVAGE   Final    Special Requests LEFT UPPER LOBE   Final    Fungal Smear NO YEAST OR FUNGAL ELEMENTS SEEN   Final    Culture CULTURE IN PROGRESS FOR FOUR WEEKS   Final    Report Status PENDING   Incomplete   AFB CULTURE WITH SMEAR     Status: Normal (Preliminary result)   Collection Time   10/31/11  9:35 AM      Component Value Range Status Comment   Specimen Description BRONCHIAL ALVEOLAR LAVAGE   Final    Special Requests RUL   Final    ACID FAST SMEAR NO ACID FAST BACILLI SEEN   Final    Culture     Final    Value: CULTURE WILL BE EXAMINED FOR 6 WEEKS BEFORE ISSUING A FINAL REPORT   Report Status PENDING   Incomplete   CULTURE, BAL-QUANTITATIVE     Status: Normal   Collection Time   10/31/11  9:35 AM      Component Value Range Status Comment   Specimen Description BRONCHIAL ALVEOLAR LAVAGE   Final    Special Requests RUL   Final    Gram Stain     Final    Value: FEW WBC PRESENT,BOTH PMN AND MONONUCLEAR     FEW SQUAMOUS EPITHELIAL CELLS PRESENT     RARE GRAM POSITIVE COCCI IN PAIRS     RARE GRAM NEGATIVE RODS   Colony Count 35,000 COLONIES/ML   Final    Culture Non-Pathogenic Oropharyngeal-type Flora Isolated.   Final    Report Status 11/02/2011 FINAL   Final   FUNGUS CULTURE W SMEAR     Status: Normal (Preliminary result)   Collection Time   10/31/11  9:35 AM      Component Value Range Status Comment   Specimen Description BRONCHIAL ALVEOLAR LAVAGE   Final    Special Requests RUL   Final    Fungal Smear NO YEAST OR FUNGAL ELEMENTS SEEN   Final    Culture CULTURE IN PROGRESS FOR FOUR WEEKS   Final    Report Status PENDING   Incomplete   GRAM STAIN     Status: Normal   Collection Time   10/31/11   9:35 AM      Component Value Range Status Comment   Specimen Description BRONCHIAL BRUSHING   Final    Special Requests RUL 1 SLIDE FOR GRAM   Final    Gram Stain     Final    Value: ABUNDANT WBC PRESENT,BOTH PMN AND MONONUCLEAR     NO SQUAMOUS EPITHELIAL CELLS SEEN     FEW GRAM POSITIVE COCCI IN PAIRS   Report Status 11/01/2011 FINAL   Final   AFB STAIN     Status: Normal   Collection Time   10/31/11  9:37 AM      Component Value Range Status Comment   Specimen Description BRONCHIAL BRUSHING   Final    Special Requests RUL 2 SLIDES   Final    ACID FAST SMEAR NO ACID FAST BACILLI SEEN   Final    Report Status 11/01/2011 FINAL   Final     Studies/Results: Dg Chest 2 View  11/02/2011  *RADIOLOGY REPORT*  Clinical Data: Shortness of breath.  Generalized weakness.  CHEST - 2 VIEW  Comparison: Portable chest x-ray 10/31/2011.  Two-view chest x-ray and CT chest 10/27/2011.  Findings: Interval slight improvement in the airspace consolidation in the right upper lobe.  Interval improved aeration throughout the left lung, though extensive consolidation, pleural thickening, and volume loss persist.  Hyperinflation involving the right lung and tenting of the right hemidiaphragm, unchanged.  No new pulmonary parenchymal abnormalities.  Prior sternotomy.  Cardiac silhouette normal in size, unchanged.  IMPRESSION: Slight improvement in the right upper lobe pneumonia and the in the consolidative process throughout the left lung since the examinations 10/27/2011.  Extensive consolidation associated with volume loss and pleural thickening persists on the left and focal airspace opacities remain in the right upper lobe.  No new abnormalities.  Original Report Authenticated By: Arnell Sieving, M.D.     Assessment/Plan: Skin Abscess  Hx of Tb, ? Treatment  Day  anbx- vanco  1st wound Cx GNR/GPC on g/s  2nd wound Cx- no org on g/s  Both final (-).  Await Cx's. AFBs from BAL on 6-3 all (-)   Contacted  health dept, his records from previous treatment are not available. Will have to rely on BAL for answers....  I contacted micro/afb lab and have asked them to look for actinomyces from BAL. They assured me this would trigger the routine alarm from broth cx's.  Will stop vanco, change to bactrim. If afb from BAL (-) would consider d/c home with health dept or ID f/u   Johny Sax Infectious Diseases [phone] 11/02/2011, 3:50 PM   LOS: 6 days

## 2011-11-02 NOTE — Progress Notes (Signed)
Clinical Social Worker spoke with [name] in [employer] [facility] regarding the consult received for disability. [name] was already aware of this patient and will follow up with patient this afternoon.   Rozetta Nunnery MSW, [employer] [phone]

## 2011-11-03 LAB — BASIC METABOLIC PANEL
BUN: 12 mg/dL (ref 6–23)
CO2: 27 mEq/L (ref 19–32)
Chloride: 99 mEq/L (ref 96–112)
GFR calc non Af Amer: >90 mL/min (ref >90)
Glucose, Bld: 130 mg/dL — ABNORMAL HIGH (ref 70–99)
Potassium: 4.3 mEq/L (ref 3.5–5.1)
Sodium: 135 mEq/L (ref 135–145)

## 2011-11-03 LAB — CBC
HCT: 26.7 % — ABNORMAL LOW (ref 39.0–52.0)
Hemoglobin: 8.7 g/dL — ABNORMAL LOW (ref 13.0–17.0)
MCHC: 32.6 g/dL (ref 30.0–36.0)
RBC: 4.01 MIL/uL — ABNORMAL LOW (ref 4.22–5.81)

## 2011-11-03 MED ORDER — FERROUS SULFATE 325 (65 FE) MG PO TABS: 325.0000 mg | ORAL_TABLET | Freq: Two times a day (BID) | ORAL | Status: DC

## 2011-11-03 MED ORDER — TRAMADOL HCL 50 MG PO TABS: 50.0000 mg | ORAL_TABLET | Freq: Four times a day (QID) | ORAL | Status: AC | PRN

## 2011-11-03 MED ORDER — SULFAMETHOXAZOLE-TMP DS 800-160 MG PO TABS: 1.0000 | ORAL_TABLET | Freq: Two times a day (BID) | ORAL | Status: AC

## 2011-11-03 MED ORDER — ALBUTEROL SULFATE HFA 108 (90 BASE) MCG/ACT IN AERS: 2.0000 | INHALATION_SPRAY | RESPIRATORY_TRACT | Status: DC | PRN

## 2011-11-03 NOTE — Discharge Instructions (Signed)
Continue Bactrim (antibiotic) every 12 hours until seen by Dr. Orvan Falconer in Infectious disease clinic for further instructions.

## 2011-11-03 NOTE — Progress Notes (Signed)
No active cardiac problems. Will sign off. Call for questions.  Vesta Mixer, Montez Hageman., MD, [name] 11/03/2011, 7:42 AM Office - [phone] Pager [phone]

## 2011-11-03 NOTE — Progress Notes (Signed)
Name: [name] MRN: [id number] DOB: [birth year]    LOS: 7  Referring Provider:  Dr. Tyrone Sage Reason for Referral:  Abnormal chest x-ray  PULMONARY / CRITICAL CARE MEDICINE  Pt profile: 50 yobm smoker with history of + PPD and CAD admitted for evaluation severe necrotizing process of L lung - presumed infectious. Also ulcerated lesion overlying the superior aspect of his sternum with purulent drainage.   FOB 6/3 >> 35k cfu nonpathogenic organisms  AFB smear  >> NEG on both BAL, brushings  Fungal smear >> NEG  AFB cx >>   Fungal cx >>   Echo 6/4: The cavity size was severely dilated. Wall thickness was increased in a pattern of mild LVH. Systolic function was severely reduced. The estimated ejection fraction was in the range of 25% to 30%. Diffuse hypokinesis.   SUBJ: No distress.  C/o gen malaise.  Denies SOB, chest pain.   Vital Signs: Temp:  [98.1 F (36.7 C)-98.6 F (37 C)] 98.1 F (36.7 C) (06/06 0545) Pulse Rate:  [85-90] 85  (06/06 0545) Resp:  [18-20] 18  (06/06 0545) BP: (92-96)/(56-62) 96/56 mmHg (06/06 0545) SpO2:  [99 %-100 %] 100 % (06/06 0545) Weight:  [115 lb (52.164 kg)] 115 lb (52.164 kg) (06/06 0545)  Physical Examination: General:  No acute distress. Looks chronically unwell Neuro:  Awake, alert, appropriate, MAE  HEENT:  Mucous membranes moist no lesions Neck:  Neck supple no thyromegaly no JVD Cardiovascular:  Regular rate and rhythm, normal S1-S2 no S3 or S4 Lungs:  Distant breath sounds on the left w/ rales, crackles; tender anterior chest wall and the upper lateral sternum with swollen draining lesion and surrounding erythema Abdomen:  Soft nontender bowel sounds active Musculoskeletal:  Full range of motion Skin:  Clear except for anterior chest wall ulceration  CXR -  Dg Chest 2 View  11/02/2011  *RADIOLOGY REPORT*  Clinical Data: Shortness of breath.  Generalized weakness.  CHEST - 2 VIEW  Comparison: Portable chest x-ray 10/31/2011.   Two-view chest x-ray and CT chest 10/27/2011.  Findings: Interval slight improvement in the airspace consolidation in the right upper lobe.  Interval improved aeration throughout the left lung, though extensive consolidation, pleural thickening, and volume loss persist.  Hyperinflation involving the right lung and tenting of the right hemidiaphragm, unchanged.  No new pulmonary parenchymal abnormalities.  Prior sternotomy.  Cardiac silhouette normal in size, unchanged.  IMPRESSION: Slight improvement in the right upper lobe pneumonia and the in the consolidative process throughout the left lung since the examinations 10/27/2011.  Extensive consolidation associated with volume loss and pleural thickening persists on the left and focal airspace opacities remain in the right upper lobe.  No new abnormalities.  Original Report Authenticated By: Arnell Sieving, M.D.    Patient Active Hospital Problem List:  Necrotizing pneumonia-- with hemoptysis and hx of + PPD   Assessment: Likely infectious. Concern for TB vs actinomyces, etc   Plan:  Follow up FOB specimens. ID managing abx -- will d/c on bactrim per recs with ID f/u as oupt  D/C resp isolation as all AFB smears are negative outpt f/u with CXR   Skin ulcer - chest wall     Assessment: concern for infectious process. Wound GS and AFB smear negative   Plan: Cont current abx -- bactrim per ID - cont until seen in ID clinic   Coronary artery disease  Severe dilated CM with EF 25-30%   Assessment: Compensated. No evidence of active ischemia.  Plan: Cont current Rx. Cards outpt f/u   Microcytic anemia   Lab 11/03/11 0318 10/29/11 0614 10/27/11 1732  HGB 8.7* 8.1* 8.4*     Assessment: Suspect due to chronic hemoptysis.  No active bleeding.    Plan:  Monitor CBC intermittently FeSO4 Consider GI eval at later date  Emphysema (11/01/2011), Smoker (10/28/2011)   Assessment: L lung is essentially totally nonfunctional and there are early  emphysematous changes in R lung.    Plan: I have counseled. Smoking cessation consultation requested  Will double check with ID and likely d/c home today with ID, pulmonary and cardiology f/u as outpt.   Danford Bad, NP 11/03/2011  10:36 AM Pager: [phone]   Billy Fischer, MD;  PCCM service; Mobile [phone]

## 2011-11-03 NOTE — Discharge Summary (Signed)
Physician Discharge Summary  Patient ID: [name] MRN: [id number] DOB/AGE: Feb 26, 1961 51 y.o.  Admit date: 10/27/2011 Discharge date: 11/03/2011    Discharge Diagnoses:  Principal Problem:  *Necrotizing pneumonia Active Problems:  Coronary artery disease  Microcytic anemia  Smoker  Emphysema  Hemoptysis  Skin ulcer  PPD positive  Cardiomyopathy    Brief Summary: [name] is a 77 yobm smoker with history of + PPD, CAD and medical noncompliance admitted 5/30 for evaluation severe necrotizing process of L lung - presumed infectious. He was sent from cariology office where he was seen 5/30 for routine f/u.  During outpt evaluation he c/o chest soreness after being hit with baseball on previous sternotomy site.  The wound was draining purulent fluid.  CXR revealed extensive L>R  mid and upper lobe predominant consolidation and was admitted for further CVTS and PCCM eval.      FOB 6/3 : 35k cfu nonpathogenic organisms  AFB smear >> NEG on both BAL, brushings  Fungal smear >> NEG  AFB cx >>  Fungal cx >>   Echo 6/4: The cavity size was severely dilated. Wall thickness was increased in a pattern of mild LVH. Systolic function was severely reduced. The estimated ejection fraction was in the range of 25% to 30%. Diffuse hypokinesis.  Consults: Cardiology ID  Lines/tubes: none  Microbiology/Sepsis markers: Sputum 5/31>>> normal flora  AFB smear 6/3 >> neg on both BAL and brushings Fungal smear 6/3 >>> neg  AFB culture 6/3 >> Fungal culture 6/3 >>>    Necrotizing pneumonia-- with hemoptysis and hx of + PPD Assessment: Likely infectious. Concern for TB vs actinomyces, etc.  All AFB smears neg. Followed closely by ID Plan:  Follow up FOB specimens. ID managing abx -- will d/c on bactrim per recs with ID f/u as oupt  D/C resp isolation as all AFB smears are negative  outpt pulmonary f/u with CXR   Skin ulcer - chest wall.   Assessment: concern for infectious  process. Wound GS and AFB smear negative Plan: Cont current abx -- bactrim per ID - cont until seen in ID clinic   Coronary artery disease  Severe dilated CM with EF 25-30%.  Followed closely by cards as inpt. See Echo report above.  Assessment: Compensated. No evidence of active ischemia.  Plan: Cont current Rx. Cards outpt f/u   Microcytic anemia   Lab  11/03/11 0318  10/29/11 0614  10/27/11 1732   HGB  8.7*  8.1*  8.4*    Assessment: Suspect due to chronic hemoptysis. No active bleeding.  Plan:  Monitor CBC intermittently  FeSO4  Consider GI eval at later date   Emphysema (11/01/2011), Smoker (10/28/2011) Assessment: L lung is essentially totally nonfunctional and there are early emphysematous changes in R lung.  Plan: Has had extensive smoking cessation counseling PRN albuterol    EXAM -  General: No acute distress. Looks chronically unwell  Neuro: Awake, alert, appropriate, MAE  HEENT: Mucous membranes moist no lesions  Neck: Neck supple no thyromegaly no JVD  Cardiovascular: Regular rate and rhythm, normal S1-S2 no S3 or S4  Lungs: Distant breath sounds on the left w/ rales, crackles; tender anterior chest wall and the upper lateral sternum with swollen draining lesion and surrounding erythema  Abdomen: Soft nontender bowel sounds active  Musculoskeletal: Full range of motion  Skin: Clear except for anterior chest wall ulceration   Discharge Labs  BMET  Lab 11/03/11 0318 10/29/11 0614 10/27/11 1732  NA 135 135 135  K 4.3 4.3 --  CL 99 100 99  CO2 27 25 26   GLUCOSE 130* 112* 148*  BUN 12 11 11   CREATININE 0.67 0.61 0.54  CALCIUM 9.4 9.3 8.8  MG -- -- --  PHOS -- -- --     CBC   Lab 11/03/11 0318 10/29/11 0614 10/27/11 1732  HGB 8.7* 8.1* 8.4*  HCT 26.7* 25.6* 24.8*  WBC 11.3* 12.7* 13.3*  PLT 726* 690* 660*     Follow-up Information    Follow up with [facility], [facility] on 11/17/2011. (2:45pm  with chest xray )    Contact information:   [address] [place] [address] [phone]       Follow up with Cliffton Asters, MD on 11/11/2011. (10:00am - Infectious Disease )    Contact information:   [address] [place] [address] [phone]       Follow up with Olga Millers, MD on 11/24/2011. (11:15am )    Contact information:   [address] [place], [address] [place] [address] [phone]           Medication List  As of 11/03/2011 11:09 AM   START taking these medications         albuterol 108 (90 BASE) MCG/ACT inhaler   Commonly known as: PROVENTIL HFA;VENTOLIN HFA   Inhale 2 puffs into the lungs every 4 (four) hours as needed for wheezing or shortness of breath.      ferrous sulfate 325 (65 FE) MG tablet   Take 1 tablet (325 mg total) by mouth 2 (two) times daily with a meal.      sulfamethoxazole-trimethoprim 800-160 MG per tablet   Commonly known as: BACTRIM DS   Take 1 tablet by mouth every 12 (twelve) hours.      traMADol 50 MG tablet   Commonly known as: ULTRAM   Take 1 tablet (50 mg total) by mouth every 6 (six) hours as needed.         CONTINUE taking these medications         aspirin 81 MG tablet      carvedilol 3.125 MG tablet   Commonly known as: COREG      ezetimibe 10 MG tablet   Commonly known as: ZETIA      simvastatin 40 MG tablet   Commonly known as: ZOCOR         STOP taking these medications         clopidogrel 75 MG tablet          Where to get your medications    These are the prescriptions that you need to pick up. We sent them to a specific pharmacy, so you will need to go there to get them.   [id number] - [place], [place] - [address] AT [facility] OF [address]    [address] [place] [address]    Phone: [phone]    Hours: 24-hours        albuterol 108 (90 BASE) MCG/ACT inhaler   ferrous sulfate 325 (65 FE) MG tablet    sulfamethoxazole-trimethoprim 800-160 MG per tablet   traMADol 50 MG tablet             Disposition: Home   Discharged Condition: [name] has met maximum benefit of inpatient care and is medically stable and cleared for discharge.  Patient is pending follow up  as above.      Time spent on disposition:  Greater than 35 minutes.   SignedDanford Bad, NP 11/03/2011  11:09 AM Pager: [phone]    Billy Fischer, MD;  PCCM service; Mobile [phone]

## 2011-11-04 ENCOUNTER — Encounter (HOSPITAL_COMMUNITY): Payer: Self-pay | Admitting: Internal Medicine

## 2011-11-09 ENCOUNTER — Telehealth: Payer: Self-pay | Admitting: *Deleted

## 2011-11-09 NOTE — Telephone Encounter (Signed)
Reminded of appt.  Gave address and directions to [facility].

## 2011-11-11 ENCOUNTER — Encounter: Payer: Self-pay | Admitting: Internal Medicine

## 2011-11-11 ENCOUNTER — Ambulatory Visit (INDEPENDENT_AMBULATORY_CARE_PROVIDER_SITE_OTHER): Payer: Self-pay | Admitting: Internal Medicine

## 2011-11-11 VITALS — BP 111/73 | HR 103 | Temp 98.7°F | Ht 67.0 in | Wt 119.2 lb

## 2011-11-11 DIAGNOSIS — Z8611 Personal history of tuberculosis: Secondary | ICD-10-CM
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
## 2011-11-17 ENCOUNTER — Ambulatory Visit (INDEPENDENT_AMBULATORY_CARE_PROVIDER_SITE_OTHER)
Admission: RE | Admit: 2011-11-17 | Discharge: 2011-11-17 | Disposition: A | Discharge status: Home / Self Care | Payer: Self-pay | Source: Ambulatory Visit | Attending: Internal Medicine | Admitting: Internal Medicine

## 2011-11-17 ENCOUNTER — Ambulatory Visit (INDEPENDENT_AMBULATORY_CARE_PROVIDER_SITE_OTHER): Payer: Self-pay | Admitting: Internal Medicine

## 2011-11-17 ENCOUNTER — Encounter: Payer: Self-pay | Admitting: Internal Medicine

## 2011-11-17 VITALS — BP 108/72 | HR 96 | Temp 98.0°F | Ht 67.0 in | Wt 124.6 lb

## 2011-11-17 DIAGNOSIS — I429 Cardiomyopathy, unspecified: Secondary | ICD-10-CM

## 2011-11-17 DIAGNOSIS — J438 Other emphysema: Secondary | ICD-10-CM

## 2011-11-17 DIAGNOSIS — I428 Other cardiomyopathies: Secondary | ICD-10-CM

## 2011-11-17 DIAGNOSIS — R911 Solitary pulmonary nodule: Secondary | ICD-10-CM

## 2011-11-17 DIAGNOSIS — F17200 Nicotine dependence, unspecified, uncomplicated: Secondary | ICD-10-CM

## 2011-11-17 DIAGNOSIS — J8410 Pulmonary fibrosis, unspecified: Secondary | ICD-10-CM

## 2011-11-17 DIAGNOSIS — J439 Emphysema, unspecified: Secondary | ICD-10-CM

## 2011-11-17 DIAGNOSIS — D509 Iron deficiency anemia, unspecified: Secondary | ICD-10-CM

## 2011-11-17 NOTE — Patient Instructions (Addendum)
#  Smoking  - please work on quitting smoking  #Lung fibrosis Left Lung  -  This is due to old TB    #Right lung copd    I think on the right lung you might have some damage from smoking; this might be a disease called COPD   Start dulera inhaler (samples) - take a few and use it one puff twice daily   do PFT end august 2013  at followup will discuss with cardiology about stopping coreg in presence of COPD (has systolic heart failure)  #Right lung spot  - there is a spot in the right lung called "nodule" on the upper part seen on CT scan 10/27/11  - please do another CT chest end of august 2013  #Anemia  - check cbc with diff at [place] end august 2013  #Followup  - end august 2013 after CT chest and with breathing test called PFT
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
## 2011-11-18 DIAGNOSIS — C3490 Malignant neoplasm of unspecified part of unspecified bronchus or lung: Secondary | ICD-10-CM | POA: Insufficient documentation

## 2011-11-18 DIAGNOSIS — J8410 Pulmonary fibrosis, unspecified: Secondary | ICD-10-CM | POA: Insufficient documentation

## 2011-11-18 NOTE — Assessment & Plan Note (Signed)
Check cbc at followup end aguust 2013 If no improvement, cnsider GI referral

## 2011-11-18 NOTE — Assessment & Plan Note (Signed)
#  Right lung spot  - there is a spot in the right lung called "nodule" on the upper part seen on CT scan 10/27/11.  - It is likely better on CXR 11/17/11  - please do another CT chest end of august 2013 to ensure resolution  #Followup  - end august 2013 after CT chest and with breathing test called PFT

## 2011-11-18 NOTE — Assessment & Plan Note (Signed)
#  Lung fibrosis Left Lung with bronchiectasis  -  This is due to old TB  this will be prone to recurrent infection and hemoptysis  On cxr 11/17/11  - infilrates improved

## 2011-11-18 NOTE — Assessment & Plan Note (Signed)
Per cards At some point he needs to come off coreg due to copd in right lung

## 2011-11-18 NOTE — Assessment & Plan Note (Signed)
#  Smoking   - please work on quitting smoking   

## 2011-11-18 NOTE — Assessment & Plan Note (Signed)
#  Right lung copd    I think on the right lung you might have some damage from smoking; this might be a disease called COPD   Start dulera inhaler (samples) - take a few and use it one puff twice daily   do PFT end august 2013  at followup will discuss with cardiology about stopping coreg in presence of COPD (has systolic heart failure)

## 2011-11-24 ENCOUNTER — Ambulatory Visit (INDEPENDENT_AMBULATORY_CARE_PROVIDER_SITE_OTHER): Payer: Self-pay | Admitting: Cardiology

## 2011-11-24 ENCOUNTER — Encounter: Payer: Self-pay | Admitting: Cardiology

## 2011-11-24 ENCOUNTER — Encounter: Payer: Self-pay | Admitting: Internal Medicine

## 2011-11-24 VITALS — BP 100/70 | HR 90 | Ht 67.0 in | Wt 120.0 lb

## 2011-11-24 DIAGNOSIS — I2510 Atherosclerotic heart disease of native coronary artery without angina pectoris: Secondary | ICD-10-CM

## 2011-11-24 DIAGNOSIS — F17200 Nicotine dependence, unspecified, uncomplicated: Secondary | ICD-10-CM

## 2011-11-24 MED ORDER — SIMVASTATIN 40 MG PO TABS: 40.0000 mg | ORAL_TABLET | Freq: Every day | ORAL | Status: DC

## 2011-11-24 MED ORDER — ALBUTEROL SULFATE HFA 108 (90 BASE) MCG/ACT IN AERS: 2.0000 | INHALATION_SPRAY | RESPIRATORY_TRACT | Status: DC | PRN

## 2011-11-24 MED ORDER — LISINOPRIL 2.5 MG PO TABS: 2.5000 mg | ORAL_TABLET | Freq: Every day | ORAL | Status: DC

## 2011-11-24 MED ORDER — CARVEDILOL 3.125 MG PO TABS: 3.1250 mg | ORAL_TABLET | Freq: Two times a day (BID) | ORAL | Status: DC

## 2011-11-24 MED ORDER — EZETIMIBE 10 MG PO TABS: 10.0000 mg | ORAL_TABLET | Freq: Every day | ORAL | Status: DC

## 2011-11-24 NOTE — Assessment & Plan Note (Signed)
Patient counseled on discontinuing. 
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
## 2011-11-24 NOTE — Assessment & Plan Note (Signed)
Arrange followup with gastroenterology. This may be related to history of lung disease and hemoptysis but most likely needs EGD and colonoscopy.

## 2011-11-24 NOTE — Assessment & Plan Note (Signed)
Continue aspirin and statin. 

## 2011-11-24 NOTE — Assessment & Plan Note (Signed)
Continue beta blocker. Add lisinopril 2.5 mg by mouth daily. Check potassium and renal function in 6 weeks. Plan repeat evaluation of LV function once medications titrated. If ejection fraction less than 35% he may require ICD.

## 2011-11-24 NOTE — Assessment & Plan Note (Signed)
Evaluation has been performed and he will followup with pulmonary and infectious disease.

## 2011-11-24 NOTE — Patient Instructions (Addendum)
Your physician recommends that you schedule a follow-up appointment in: 6-8 WEEKS WITH DR CRENSHAW  START LISINOPRIL 2.5 MG ONCE DAILY  Your physician recommends that you return for lab work in: 6 WEEKS = FASTING   NEEDS FOLLOW UP APPT WITH DR PYRTLE=[facility] PT IN THE HOSP

## 2011-11-24 NOTE — Assessment & Plan Note (Signed)
Continue statin. Check lipids and liver in 6 weeks. 

## 2011-11-25 LAB — FUNGUS CULTURE W SMEAR: Fungal Smear: NONE SEEN

## 2011-12-05 LAB — FUNGUS CULTURE W SMEAR

## 2011-12-10 LAB — AFB CULTURE WITH SMEAR (NOT AT ARMC)
Acid Fast Smear: NONE SEEN
Acid Fast Smear: NONE SEEN

## 2011-12-13 ENCOUNTER — Ambulatory Visit: Payer: Self-pay | Admitting: Internal Medicine

## 2011-12-13 LAB — AFB CULTURE WITH SMEAR (NOT AT ARMC): Acid Fast Smear: NONE SEEN

## 2011-12-15 ENCOUNTER — Encounter: Payer: Self-pay | Admitting: Internal Medicine

## 2011-12-21 ENCOUNTER — Ambulatory Visit: Payer: Self-pay | Admitting: Internal Medicine

## 2012-01-18 ENCOUNTER — Ambulatory Visit: Payer: Self-pay | Admitting: Internal Medicine

## 2012-01-19 ENCOUNTER — Ambulatory Visit: Payer: Self-pay | Admitting: Cardiology

## 2012-01-19 ENCOUNTER — Other Ambulatory Visit: Payer: Self-pay

## 2012-01-23 ENCOUNTER — Encounter: Payer: Self-pay | Admitting: Internal Medicine

## 2012-01-24 ENCOUNTER — Inpatient Hospital Stay: Admission: RE | Admit: 2012-01-24 | Payer: Self-pay | Source: Ambulatory Visit

## 2012-01-24 ENCOUNTER — Ambulatory Visit: Payer: Self-pay | Admitting: Internal Medicine

## 2012-06-15 ENCOUNTER — Encounter: Payer: Self-pay | Admitting: Cardiology

## 2012-06-15 ENCOUNTER — Ambulatory Visit (INDEPENDENT_AMBULATORY_CARE_PROVIDER_SITE_OTHER): Payer: Self-pay | Admitting: Cardiology

## 2012-06-15 ENCOUNTER — Encounter: Payer: Self-pay | Admitting: Internal Medicine

## 2012-06-15 VITALS — BP 92/50 | HR 71 | Resp 18 | Ht 67.0 in | Wt 138.0 lb

## 2012-06-15 DIAGNOSIS — I429 Cardiomyopathy, unspecified: Secondary | ICD-10-CM

## 2012-06-15 DIAGNOSIS — F17200 Nicotine dependence, unspecified, uncomplicated: Secondary | ICD-10-CM

## 2012-06-15 DIAGNOSIS — D509 Iron deficiency anemia, unspecified: Secondary | ICD-10-CM

## 2012-06-15 DIAGNOSIS — J850 Gangrene and necrosis of lung: Secondary | ICD-10-CM

## 2012-06-15 DIAGNOSIS — J852 Abscess of lung without pneumonia: Secondary | ICD-10-CM

## 2012-06-15 DIAGNOSIS — I428 Other cardiomyopathies: Secondary | ICD-10-CM

## 2012-06-15 DIAGNOSIS — I2510 Atherosclerotic heart disease of native coronary artery without angina pectoris: Secondary | ICD-10-CM

## 2012-06-15 MED ORDER — LISINOPRIL 2.5 MG PO TABS: 2.5000 mg | ORAL_TABLET | Freq: Every day | ORAL | Status: DC
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
## 2012-06-15 NOTE — Assessment & Plan Note (Signed)
Patient counseled on discontinuing. 

## 2012-06-15 NOTE — Assessment & Plan Note (Signed)
Previous necrotizing pneumonia. He was to followup with pulmonary but was unable to because he was incarcerated. We will reschedule as it appears they wanted repeat imaging.

## 2012-06-15 NOTE — Assessment & Plan Note (Signed)
Continue statin. 

## 2012-06-15 NOTE — Assessment & Plan Note (Signed)
Continue beta blocker. Add lisinopril 2.5 mg daily. Check potassium and renal function in one week. Repeat echocardiogram in 3 months on medications.  If ejection fraction less than or equal to 35% consider ICD.

## 2012-06-15 NOTE — Patient Instructions (Addendum)
Your physician recommends that you schedule a follow-up appointment in: 3 MONTHS WITH DR CRENSHAW  APPT FOR FOLLOW UP WITH DR RAMASWAMY  APPT FOR FOLLOW UP WITH DR PYRTLE  RESTART LISINOPRIL 2.5 MG ONCE DAILY  STOP PLAVIX  Your physician recommends that you return for lab work in: ONE WEEK  Your physician has requested that you have an echocardiogram. Echocardiography is a painless test that uses sound waves to create images of your heart. It provides your doctor with information about the size and shape of your heart and how well your heart's chambers and valves are working. This procedure takes approximately one hour. There are no restrictions for this procedure.SCHEDULE PRIOR TO APPT IN 3 MONTHS

## 2012-06-15 NOTE — Assessment & Plan Note (Signed)
Patient should followup with gastroenterology as he may require endoscopy.

## 2012-06-15 NOTE — Assessment & Plan Note (Signed)
Continue aspirin but discontinue Plavix. Continue statin. 

## 2012-06-22 ENCOUNTER — Other Ambulatory Visit (INDEPENDENT_AMBULATORY_CARE_PROVIDER_SITE_OTHER): Payer: Self-pay

## 2012-06-22 DIAGNOSIS — I2510 Atherosclerotic heart disease of native coronary artery without angina pectoris: Secondary | ICD-10-CM

## 2012-06-22 DIAGNOSIS — I428 Other cardiomyopathies: Secondary | ICD-10-CM

## 2012-06-22 DIAGNOSIS — I429 Cardiomyopathy, unspecified: Secondary | ICD-10-CM

## 2012-06-22 LAB — BASIC METABOLIC PANEL
BUN: 18 mg/dL (ref 6–23)
CO2: 25 mEq/L (ref 19–32)
Chloride: 106 mEq/L (ref 96–112)
Creatinine, Ser: 1 mg/dL (ref 0.4–1.5)
Glucose, Bld: 136 mg/dL — ABNORMAL HIGH (ref 70–99)
Potassium: 3.8 mEq/L (ref 3.5–5.1)

## 2012-07-03 ENCOUNTER — Ambulatory Visit: Payer: Self-pay | Admitting: Internal Medicine

## 2012-07-05 ENCOUNTER — Encounter: Payer: Self-pay | Admitting: Internal Medicine

## 2012-07-10 ENCOUNTER — Ambulatory Visit: Payer: Self-pay | Admitting: Internal Medicine

## 2012-07-19 ENCOUNTER — Ambulatory Visit (INDEPENDENT_AMBULATORY_CARE_PROVIDER_SITE_OTHER): Payer: Self-pay | Admitting: Internal Medicine

## 2012-07-19 ENCOUNTER — Encounter: Payer: Self-pay | Admitting: Internal Medicine

## 2012-07-19 VITALS — BP 128/82 | HR 76 | Temp 98.1°F | Ht 68.0 in | Wt 140.2 lb

## 2012-07-19 DIAGNOSIS — R911 Solitary pulmonary nodule: Secondary | ICD-10-CM

## 2012-07-19 DIAGNOSIS — F17200 Nicotine dependence, unspecified, uncomplicated: Secondary | ICD-10-CM

## 2012-07-19 DIAGNOSIS — A150 Tuberculosis of lung: Secondary | ICD-10-CM

## 2012-07-19 DIAGNOSIS — D509 Iron deficiency anemia, unspecified: Secondary | ICD-10-CM

## 2012-07-19 DIAGNOSIS — D649 Anemia, unspecified: Secondary | ICD-10-CM

## 2012-07-19 DIAGNOSIS — J438 Other emphysema: Secondary | ICD-10-CM

## 2012-07-19 DIAGNOSIS — J439 Emphysema, unspecified: Secondary | ICD-10-CM

## 2012-07-19 NOTE — Assessment & Plan Note (Signed)
Get pulmonary function test

## 2012-07-19 NOTE — Assessment & Plan Note (Signed)
His been advised to quit. I will build into this in greater detail at future visits
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
## 2012-07-19 NOTE — Assessment & Plan Note (Signed)
Get CT chest.

## 2012-07-19 NOTE — Assessment & Plan Note (Signed)
Repeat CBC

## 2012-07-19 NOTE — Patient Instructions (Addendum)
Please her breathing test called full pulmonary function  please have CT scan of the chest Please have cbc blood test for anemia Return to see me after above

## 2012-07-24 ENCOUNTER — Ambulatory Visit (INDEPENDENT_AMBULATORY_CARE_PROVIDER_SITE_OTHER)
Admission: RE | Admit: 2012-07-24 | Discharge: 2012-07-24 | Disposition: A | Discharge status: Home / Self Care | Payer: Self-pay | Source: Ambulatory Visit | Attending: Internal Medicine | Admitting: Internal Medicine

## 2012-07-24 DIAGNOSIS — R911 Solitary pulmonary nodule: Secondary | ICD-10-CM

## 2012-07-24 DIAGNOSIS — F17200 Nicotine dependence, unspecified, uncomplicated: Secondary | ICD-10-CM

## 2012-07-24 DIAGNOSIS — A150 Tuberculosis of lung: Secondary | ICD-10-CM

## 2012-08-10 ENCOUNTER — Other Ambulatory Visit (HOSPITAL_COMMUNITY): Payer: Self-pay

## 2012-08-10 DIAGNOSIS — J441 Chronic obstructive pulmonary disease with (acute) exacerbation: Secondary | ICD-10-CM

## 2012-08-17 ENCOUNTER — Inpatient Hospital Stay (HOSPITAL_COMMUNITY): Admission: RE | Admit: 2012-08-17 | Payer: Self-pay | Source: Ambulatory Visit

## 2012-08-21 ENCOUNTER — Ambulatory Visit: Payer: Self-pay | Admitting: Internal Medicine

## 2012-08-23 ENCOUNTER — Ambulatory Visit (HOSPITAL_COMMUNITY)
Admission: RE | Admit: 2012-08-23 | Discharge: 2012-08-23 | Disposition: A | Discharge status: Home / Self Care | Payer: Self-pay | Source: Ambulatory Visit | Attending: Cardiovascular Disease | Admitting: Cardiovascular Disease

## 2012-08-23 DIAGNOSIS — J449 Chronic obstructive pulmonary disease, unspecified: Secondary | ICD-10-CM | POA: Insufficient documentation

## 2012-08-23 DIAGNOSIS — E785 Hyperlipidemia, unspecified: Secondary | ICD-10-CM | POA: Insufficient documentation

## 2012-08-23 DIAGNOSIS — I079 Rheumatic tricuspid valve disease, unspecified: Secondary | ICD-10-CM | POA: Insufficient documentation

## 2012-08-23 DIAGNOSIS — J4489 Other specified chronic obstructive pulmonary disease: Secondary | ICD-10-CM | POA: Insufficient documentation

## 2012-08-23 DIAGNOSIS — I255 Ischemic cardiomyopathy: Secondary | ICD-10-CM

## 2012-08-23 DIAGNOSIS — I519 Heart disease, unspecified: Secondary | ICD-10-CM | POA: Insufficient documentation

## 2012-08-23 DIAGNOSIS — R0609 Other forms of dyspnea: Secondary | ICD-10-CM | POA: Insufficient documentation

## 2012-08-23 DIAGNOSIS — R0989 Other specified symptoms and signs involving the circulatory and respiratory systems: Secondary | ICD-10-CM | POA: Insufficient documentation

## 2012-08-23 DIAGNOSIS — J441 Chronic obstructive pulmonary disease with (acute) exacerbation: Secondary | ICD-10-CM

## 2012-08-23 DIAGNOSIS — I252 Old myocardial infarction: Secondary | ICD-10-CM | POA: Insufficient documentation

## 2012-08-23 DIAGNOSIS — I2589 Other forms of chronic ischemic heart disease: Secondary | ICD-10-CM | POA: Insufficient documentation

## 2012-08-23 NOTE — Progress Notes (Signed)
2D Echo Performed 08/23/2012    Clearence Ped, RCS

## 2012-09-13 ENCOUNTER — Other Ambulatory Visit (HOSPITAL_COMMUNITY): Payer: Self-pay

## 2012-09-18 ENCOUNTER — Ambulatory Visit (INDEPENDENT_AMBULATORY_CARE_PROVIDER_SITE_OTHER): Payer: Self-pay | Admitting: Cardiology

## 2012-09-18 ENCOUNTER — Encounter: Payer: Self-pay | Admitting: Cardiology

## 2012-09-18 VITALS — BP 114/86 | HR 94 | Ht 67.0 in | Wt 132.0 lb

## 2012-09-18 DIAGNOSIS — R911 Solitary pulmonary nodule: Secondary | ICD-10-CM

## 2012-09-18 DIAGNOSIS — I428 Other cardiomyopathies: Secondary | ICD-10-CM

## 2012-09-18 DIAGNOSIS — E785 Hyperlipidemia, unspecified: Secondary | ICD-10-CM

## 2012-09-18 DIAGNOSIS — F17200 Nicotine dependence, unspecified, uncomplicated: Secondary | ICD-10-CM

## 2012-09-18 DIAGNOSIS — I2510 Atherosclerotic heart disease of native coronary artery without angina pectoris: Secondary | ICD-10-CM

## 2012-09-18 DIAGNOSIS — D509 Iron deficiency anemia, unspecified: Secondary | ICD-10-CM

## 2012-09-18 DIAGNOSIS — I429 Cardiomyopathy, unspecified: Secondary | ICD-10-CM

## 2012-09-18 MED ORDER — LISINOPRIL 2.5 MG PO TABS: 2.5000 mg | ORAL_TABLET | Freq: Every day | ORAL | Status: DC

## 2012-09-18 MED ORDER — CARVEDILOL 3.125 MG PO TABS: 3.1250 mg | ORAL_TABLET | Freq: Two times a day (BID) | ORAL | Status: DC

## 2012-09-18 MED ORDER — SIMVASTATIN 40 MG PO TABS: 40.0000 mg | ORAL_TABLET | Freq: Every day | ORAL | Status: DC
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
## 2012-09-18 NOTE — Patient Instructions (Addendum)
Your physician recommends that you schedule a follow-up appointment in: 4 MONTHS WITH DR CRENSHAW  RESTART ALL MEDS

## 2012-09-18 NOTE — Assessment & Plan Note (Signed)
Patient's LV function continues to be reduced. He is not taking his medications routinely because of financial constraints. We will attempt to have case management review to see if there is assistance available. We will resume previous dose of carvedilol and lisinopril increase as tolerated. He would be a candidate for ICD in the future. However he would need to demonstrate compliance with medications first. There is also a question of a lung nodule and further evaluation is indicated. He will see pulmonary back in approximately one month for further assessment. He has also been noted to have a microcytic anemia and GI evaluation recommended but this has not occurred yet. We will need to have all of these issues reviewed prior to considering ICD. This was discussed with the patient in detail.

## 2012-09-18 NOTE — Assessment & Plan Note (Signed)
Continue aspirin and statin. 

## 2012-09-18 NOTE — Assessment & Plan Note (Signed)
Again discussed need for GI evaluation.

## 2012-09-18 NOTE — Assessment & Plan Note (Signed)
Patient counseled on discontinuing. 

## 2012-09-18 NOTE — Assessment & Plan Note (Signed)
Resume Zocor 40 mg daily.

## 2012-09-18 NOTE — Assessment & Plan Note (Signed)
Patient is scheduled to see pulmonary back in may need a PET scan.

## 2012-09-24 ENCOUNTER — Ambulatory Visit: Payer: Self-pay | Admitting: Internal Medicine

## 2012-10-24 ENCOUNTER — Ambulatory Visit (INDEPENDENT_AMBULATORY_CARE_PROVIDER_SITE_OTHER): Payer: Self-pay | Admitting: Internal Medicine

## 2012-10-24 ENCOUNTER — Other Ambulatory Visit (INDEPENDENT_AMBULATORY_CARE_PROVIDER_SITE_OTHER): Payer: Self-pay

## 2012-10-24 ENCOUNTER — Encounter: Payer: Self-pay | Admitting: Internal Medicine

## 2012-10-24 VITALS — BP 108/60 | HR 79 | Temp 98.1°F | Ht 67.5 in | Wt 132.0 lb

## 2012-10-24 DIAGNOSIS — R911 Solitary pulmonary nodule: Secondary | ICD-10-CM

## 2012-10-24 DIAGNOSIS — J852 Abscess of lung without pneumonia: Secondary | ICD-10-CM

## 2012-10-24 DIAGNOSIS — D638 Anemia in other chronic diseases classified elsewhere: Secondary | ICD-10-CM

## 2012-10-24 DIAGNOSIS — J850 Gangrene and necrosis of lung: Secondary | ICD-10-CM

## 2012-10-24 DIAGNOSIS — D509 Iron deficiency anemia, unspecified: Secondary | ICD-10-CM

## 2012-10-24 DIAGNOSIS — J439 Emphysema, unspecified: Secondary | ICD-10-CM

## 2012-10-24 DIAGNOSIS — J438 Other emphysema: Secondary | ICD-10-CM

## 2012-10-24 DIAGNOSIS — A150 Tuberculosis of lung: Secondary | ICD-10-CM

## 2012-10-24 DIAGNOSIS — F17200 Nicotine dependence, unspecified, uncomplicated: Secondary | ICD-10-CM

## 2012-10-24 LAB — CBC WITH DIFFERENTIAL/PLATELET
Basophils Absolute: 0.1 10*3/uL (ref 0.0–0.1)
Eosinophils Relative: 7.9 % — ABNORMAL HIGH (ref 0.0–5.0)
HCT: 48.7 % (ref 39.0–52.0)
Hemoglobin: 16.7 g/dL (ref 13.0–17.0)
Lymphocytes Relative: 33.8 % (ref 12.0–46.0)
Lymphs Abs: 3.1 10*3/uL (ref 0.7–4.0)
Monocytes Relative: 4.5 % (ref 3.0–12.0)
Neutro Abs: 4.9 10*3/uL (ref 1.4–7.7)
RDW: 15.4 % — ABNORMAL HIGH (ref 11.5–14.6)
WBC: 9.2 10*3/uL (ref 4.5–10.5)

## 2012-10-24 LAB — PULMONARY FUNCTION TEST

## 2012-10-24 NOTE — Assessment & Plan Note (Addendum)
Repeat PET scan in July 2014 which would be 4-5 months since February 2014

## 2012-10-24 NOTE — Assessment & Plan Note (Signed)
Gold stage II COPD on PFTs May 2014. Will start Spiriva. We'll give him samples do to his lack of money

## 2012-10-24 NOTE — Assessment & Plan Note (Signed)
Repeat CBC. If he still anemic will refer to GI. He has not seen GI due to insurance issues or lack there of

## 2012-10-24 NOTE — Assessment & Plan Note (Signed)
Adverse strongly to quit.

## 2012-10-24 NOTE — Progress Notes (Signed)
PFT done today. 
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
## 2012-10-24 NOTE — Patient Instructions (Addendum)
#  Anemia  - do blood work now  #COPD  - start spiriva 1 puff daily; learn technique   #smoking tobacco and marijuana  - please quit ASAP  #Lung nodule  - do PET scan in 2 months  #Social  - get medicaid  #Followup  2 months with PET scan and CAT score

## 2012-10-24 NOTE — Assessment & Plan Note (Signed)
Resolve on CT scan of the chest February 2014

## 2012-10-31 ENCOUNTER — Telehealth: Payer: Self-pay | Admitting: Internal Medicine

## 2012-10-31 NOTE — Telephone Encounter (Signed)
I spoke with patient about results and he verbalized understanding and had no questions 

## 2012-12-24 ENCOUNTER — Encounter (HOSPITAL_COMMUNITY): Payer: Self-pay

## 2012-12-24 ENCOUNTER — Encounter (HOSPITAL_COMMUNITY)
Admission: RE | Admit: 2012-12-24 | Discharge: 2012-12-24 | Disposition: A | Discharge status: Home / Self Care | Payer: Self-pay | Source: Ambulatory Visit | Attending: Internal Medicine | Admitting: Internal Medicine

## 2012-12-24 DIAGNOSIS — R599 Enlarged lymph nodes, unspecified: Secondary | ICD-10-CM | POA: Insufficient documentation

## 2012-12-24 DIAGNOSIS — R911 Solitary pulmonary nodule: Secondary | ICD-10-CM

## 2012-12-24 DIAGNOSIS — Z8611 Personal history of tuberculosis: Secondary | ICD-10-CM | POA: Insufficient documentation

## 2012-12-24 DIAGNOSIS — J479 Bronchiectasis, uncomplicated: Secondary | ICD-10-CM | POA: Insufficient documentation

## 2012-12-24 DIAGNOSIS — D638 Anemia in other chronic diseases classified elsewhere: Secondary | ICD-10-CM

## 2012-12-24 DIAGNOSIS — F17200 Nicotine dependence, unspecified, uncomplicated: Secondary | ICD-10-CM | POA: Insufficient documentation

## 2012-12-24 HISTORY — DX: Solitary pulmonary nodule: R91.1

## 2012-12-24 LAB — GLUCOSE, CAPILLARY: Glucose-Capillary: 93 mg/dL (ref 70–99)

## 2012-12-24 MED ORDER — FLUDEOXYGLUCOSE F - 18 (FDG) INJECTION
20.1000 | Freq: Once | INTRAVENOUS | Status: AC | PRN
  Administered 2012-12-24: 20.1 via INTRAVENOUS

## 2012-12-27 ENCOUNTER — Ambulatory Visit (INDEPENDENT_AMBULATORY_CARE_PROVIDER_SITE_OTHER): Payer: Self-pay | Admitting: Internal Medicine

## 2012-12-27 ENCOUNTER — Encounter: Payer: Self-pay | Admitting: Internal Medicine

## 2012-12-27 VITALS — BP 138/90 | HR 50 | Temp 98.2°F | Ht 68.0 in | Wt 134.6 lb

## 2012-12-27 DIAGNOSIS — J439 Emphysema, unspecified: Secondary | ICD-10-CM

## 2012-12-27 DIAGNOSIS — R911 Solitary pulmonary nodule: Secondary | ICD-10-CM

## 2012-12-27 DIAGNOSIS — F17200 Nicotine dependence, unspecified, uncomplicated: Secondary | ICD-10-CM

## 2012-12-27 DIAGNOSIS — J438 Other emphysema: Secondary | ICD-10-CM
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
## 2012-12-27 NOTE — Patient Instructions (Addendum)
#  COPD - stable disease  - continue spiriva 1 puff daily   #smoking tobacco and marijuana  - congratulations on quitting  #Lung nodule  - PET scan suggests it is not cancer - I will have a company called [employer] contact you to do blood test on cancer probability  - Otherwise, nxt CT chest in a year  #Social  - get medicaid - no consturction work but ok for low exertion work that involves standing or sitting  - agree with disability application  #Followup  6 months with CAT score

## 2012-12-27 NOTE — Assessment & Plan Note (Signed)
congraulated on quitting. He is holding up with will power. REfused medication help

## 2012-12-27 NOTE — Assessment & Plan Note (Addendum)
#  Lung nodule  - PET scan suggests it is not cancer - I will have a company called [employer] contact you to do blood test on cancer probability; test determines negative predictive value - IF this test is normal,  nxt CT chest in a year   Emailed Lyndle Herrlich 12/27/2012 to get this organized

## 2012-12-27 NOTE — Assessment & Plan Note (Addendum)
STable copd.   Advised to continue spiriva  - get medicaid - no consturction work but ok for low exertion work that involves standing or sitting  - agree with disability application  #Followup  6 months with CAT score

## 2012-12-28 ENCOUNTER — Encounter: Payer: Self-pay | Admitting: *Deleted

## 2012-12-28 NOTE — Progress Notes (Signed)
Faxed testing request to [employer] per Dr. Marchelle Gearing

## 2013-01-24 ENCOUNTER — Ambulatory Visit: Payer: Self-pay | Admitting: Cardiology

## 2013-01-25 ENCOUNTER — Encounter: Payer: Self-pay | Admitting: Cardiology

## 2013-01-25 ENCOUNTER — Ambulatory Visit (INDEPENDENT_AMBULATORY_CARE_PROVIDER_SITE_OTHER): Payer: Self-pay | Admitting: Cardiology

## 2013-01-25 VITALS — BP 106/65 | HR 66 | Ht 68.0 in | Wt 131.8 lb

## 2013-01-25 DIAGNOSIS — I2510 Atherosclerotic heart disease of native coronary artery without angina pectoris: Secondary | ICD-10-CM

## 2013-01-25 DIAGNOSIS — I429 Cardiomyopathy, unspecified: Secondary | ICD-10-CM

## 2013-01-25 DIAGNOSIS — I428 Other cardiomyopathies: Secondary | ICD-10-CM

## 2013-01-25 DIAGNOSIS — F17200 Nicotine dependence, unspecified, uncomplicated: Secondary | ICD-10-CM

## 2013-01-25 DIAGNOSIS — R911 Solitary pulmonary nodule: Secondary | ICD-10-CM

## 2013-01-25 DIAGNOSIS — E785 Hyperlipidemia, unspecified: Secondary | ICD-10-CM

## 2013-01-25 DIAGNOSIS — D509 Iron deficiency anemia, unspecified: Secondary | ICD-10-CM

## 2013-01-25 MED ORDER — CARVEDILOL 6.25 MG PO TABS: 6.2500 mg | ORAL_TABLET | Freq: Two times a day (BID) | ORAL | Status: DC

## 2013-01-25 NOTE — Patient Instructions (Addendum)
Your physician wants you to follow-up in: 6 MONTHS WITH DR Jens Som You will receive a reminder letter in the mail two months in advance. If you don't receive a letter, please call our office to schedule the follow-up appointment.   INCREASE CARVEDILOL TO 6.25 MG TWICE DAILY  REFERRAL TO ELECTROPHYSIOLOGY TO DISCUSS ICD
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
## 2013-01-25 NOTE — Assessment & Plan Note (Signed)
Patient has now found financial assistance for medications. He is compliant. Continue ACE inhibitor and increase carvedilol to 6.25 mg by mouth twice a day. I will refer him to electrophysiology for consideration of ICD given reduced LV function. We previously delayed referral because of multiple medical issues. These appear to have improved including his previous necrotizing pneumonia. He does have a small nodule that has been evaluated by pulmonary. His PET scan suggested possible inflammatory or infectious etiology but low-grade malignancy cannot be excluded. However, Dr. Marchelle Gearing has ordered blood work that potentially predicts risk of cancer with results pending. It appears that this is most likely not malignant. He also had previous microcytic anemia which has now resolved. Most likely from previous hemoptysis related to his pneumonia.

## 2013-01-25 NOTE — Assessment & Plan Note (Signed)
Continue statin. 

## 2013-01-25 NOTE — Assessment & Plan Note (Signed)
Continue aspirin and statin. 

## 2013-01-25 NOTE — Assessment & Plan Note (Signed)
Patient counseled on discontinuing. 

## 2013-01-28 ENCOUNTER — Telehealth: Payer: Self-pay | Admitting: Internal Medicine

## 2013-01-28 DIAGNOSIS — R911 Solitary pulmonary nodule: Secondary | ICD-10-CM

## 2013-01-28 NOTE — Telephone Encounter (Signed)
xpresys lng test which is good for NPV for lung lesion/nodule is indterminate. TEst unhelpful.   SO, please set for CT chest wo contrast 6 months from [id number] and have him come for fu   Dr. Kalman Shan, M.D., [facility].C.P Pulmonary and Critical Care Medicine Staff Physician [facility]monary [facility] Pager: [phone], If no answer or between  15:00h - 7:00h: call [phone]  01/28/2013 2:55 PM

## 2013-01-30 NOTE — Telephone Encounter (Signed)
[id number] x 1 

## 2013-02-01 NOTE — Telephone Encounter (Signed)
[id number]. On home number, tried cell number, NA, no voicemail.  Carron Curie, CMA

## 2013-02-01 NOTE — Telephone Encounter (Signed)
Pt advised and order placed. [name] Castillo, CMA  

## 2013-02-04 ENCOUNTER — Encounter: Payer: Self-pay | Admitting: *Deleted

## 2013-02-04 ENCOUNTER — Encounter: Payer: Self-pay | Admitting: Internal Medicine

## 2013-02-04 ENCOUNTER — Ambulatory Visit (INDEPENDENT_AMBULATORY_CARE_PROVIDER_SITE_OTHER): Payer: Medicaid Other | Admitting: Internal Medicine

## 2013-02-04 VITALS — BP 107/77 | HR 77 | Ht 68.0 in | Wt 133.0 lb

## 2013-02-04 DIAGNOSIS — I2589 Other forms of chronic ischemic heart disease: Secondary | ICD-10-CM

## 2013-02-04 DIAGNOSIS — I255 Ischemic cardiomyopathy: Secondary | ICD-10-CM

## 2013-02-04 DIAGNOSIS — I5022 Chronic systolic (congestive) heart failure: Secondary | ICD-10-CM

## 2013-02-04 NOTE — Assessment & Plan Note (Addendum)
The patient has ischemic cardiomyopathy with persistent left ventricular dysfunction despite guidelines medical therapy up titration of which has been somewhat limited by hypotension. His ejection fraction remains and 25-30% range. He has class 2-3 heart failure.  Have reviewed the potential benefits and risks of ICD implantation including but not limited to death, perforation of heart or lung, lead dislodgement, infection,  device malfunction and inappropriate shocks.  The patient express understanding  and are willing to proceed.    He is an ongoing process; however, I don't think this is excluding given his young age and the fact that it seems indolent enough that his prognosis is not measured in months
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
## 2013-02-04 NOTE — Patient Instructions (Addendum)
Your physician has recommended that you have a defibrillator inserted. An implantable cardioverter defibrillator (ICD) is a small device that is placed in your chest or, in rare cases, your abdomen. This device uses electrical pulses or shocks to help control life-threatening, irregular heartbeats that could lead the heart to suddenly stop beating (sudden cardiac arrest). Leads are attached to the ICD that goes into your heart. This is done in the hospital and usually requires an overnight stay. Please see the instruction sheet given to you today for more information.  Your physician recommends that you return for lab work on 02/18/13: BMET & CBCD  Your physician recommends that you continue on your current medications as directed. Please refer to the Current Medication list given to you today.

## 2013-02-05 NOTE — Assessment & Plan Note (Signed)
euvolemic  Continue current meds 

## 2013-02-18 ENCOUNTER — Other Ambulatory Visit (INDEPENDENT_AMBULATORY_CARE_PROVIDER_SITE_OTHER): Payer: Medicaid Other

## 2013-02-18 ENCOUNTER — Encounter (HOSPITAL_COMMUNITY): Payer: Self-pay | Admitting: Pharmacy Technician

## 2013-02-18 DIAGNOSIS — I2589 Other forms of chronic ischemic heart disease: Secondary | ICD-10-CM

## 2013-02-18 DIAGNOSIS — I255 Ischemic cardiomyopathy: Secondary | ICD-10-CM

## 2013-02-18 LAB — CBC WITH DIFFERENTIAL/PLATELET
Basophils Absolute: 0 10*3/uL (ref 0.0–0.1)
Eosinophils Absolute: 0.6 10*3/uL (ref 0.0–0.7)
Eosinophils Relative: 6.1 % — ABNORMAL HIGH (ref 0.0–5.0)
HCT: 41.2 % (ref 39.0–52.0)
Lymphs Abs: 3 10*3/uL (ref 0.7–4.0)
MCHC: 34 g/dL (ref 30.0–36.0)
MCV: 86.1 fl (ref 78.0–100.0)
Monocytes Absolute: 0.5 10*3/uL (ref 0.1–1.0)
Platelets: 340 10*3/uL (ref 150.0–400.0)
RDW: 15.7 % — ABNORMAL HIGH (ref 11.5–14.6)

## 2013-02-18 LAB — BASIC METABOLIC PANEL
BUN: 9 mg/dL (ref 6–23)
CO2: 24 mEq/L (ref 19–32)
Chloride: 107 mEq/L (ref 96–112)
Creatinine, Ser: 0.9 mg/dL (ref 0.4–1.5)
Glucose, Bld: 76 mg/dL (ref 70–99)
Potassium: 4.2 mEq/L (ref 3.5–5.1)

## 2013-02-18 LAB — PROTIME-INR: Prothrombin Time: 11.5 s (ref 10.2–12.4)

## 2013-02-24 MED ORDER — SODIUM CHLORIDE 0.9 % IR SOLN
80.0000 mg | Status: DC
  Filled 2013-02-24: qty 2

## 2013-02-24 MED ORDER — CEFAZOLIN SODIUM-DEXTROSE 2-3 GM-% IV SOLR
2.0000 g | INTRAVENOUS | Status: DC
  Filled 2013-02-24 (×2): qty 50

## 2013-02-25 ENCOUNTER — Ambulatory Visit (HOSPITAL_COMMUNITY)
Admission: RE | Admit: 2013-02-25 | Discharge: 2013-02-26 | Disposition: A | Discharge status: Home / Self Care | Payer: Medicaid Other | Source: Ambulatory Visit | Attending: Internal Medicine | Admitting: Internal Medicine

## 2013-02-25 ENCOUNTER — Encounter (HOSPITAL_COMMUNITY): Payer: Self-pay | Admitting: General Practice

## 2013-02-25 ENCOUNTER — Encounter (HOSPITAL_COMMUNITY)
Admission: RE | Disposition: A | Discharge status: Home / Self Care | Payer: Medicaid Other | Source: Ambulatory Visit | Attending: Internal Medicine

## 2013-02-25 DIAGNOSIS — I255 Ischemic cardiomyopathy: Secondary | ICD-10-CM

## 2013-02-25 DIAGNOSIS — I252 Old myocardial infarction: Secondary | ICD-10-CM | POA: Insufficient documentation

## 2013-02-25 DIAGNOSIS — I2510 Atherosclerotic heart disease of native coronary artery without angina pectoris: Secondary | ICD-10-CM | POA: Insufficient documentation

## 2013-02-25 DIAGNOSIS — I2589 Other forms of chronic ischemic heart disease: Secondary | ICD-10-CM | POA: Insufficient documentation

## 2013-02-25 DIAGNOSIS — I509 Heart failure, unspecified: Secondary | ICD-10-CM | POA: Insufficient documentation

## 2013-02-25 DIAGNOSIS — J4489 Other specified chronic obstructive pulmonary disease: Secondary | ICD-10-CM | POA: Insufficient documentation

## 2013-02-25 DIAGNOSIS — J449 Chronic obstructive pulmonary disease, unspecified: Secondary | ICD-10-CM | POA: Insufficient documentation

## 2013-02-25 DIAGNOSIS — I428 Other cardiomyopathies: Secondary | ICD-10-CM | POA: Insufficient documentation

## 2013-02-25 HISTORY — DX: Presence of automatic (implantable) cardiac defibrillator: Z95.810

## 2013-02-25 HISTORY — PX: IMPLANTABLE CARDIOVERTER DEFIBRILLATOR IMPLANT: SHX5473

## 2013-02-25 HISTORY — DX: Shortness of breath: R06.02

## 2013-02-25 LAB — SURGICAL PCR SCREEN: MRSA, PCR: NEGATIVE

## 2013-02-25 SURGERY — IMPLANTABLE CARDIOVERTER DEFIBRILLATOR IMPLANT
Anesthesia: LOCAL

## 2013-02-25 MED ORDER — ATROPINE SULFATE 0.1 MG/ML IJ SOLN
INTRAMUSCULAR | Status: AC
  Filled 2013-02-25: qty 10

## 2013-02-25 MED ORDER — BUPROPION HCL ER (XL) 150 MG PO TB24
150.0000 mg | ORAL_TABLET | Freq: Every day | ORAL | Status: DC
  Administered 2013-02-25: 150 mg via ORAL
  Filled 2013-02-25 (×2): qty 1

## 2013-02-25 MED ORDER — LIDOCAINE HCL (PF) 1 % IJ SOLN
INTRAMUSCULAR | Status: AC
  Filled 2013-02-25: qty 60

## 2013-02-25 MED ORDER — EZETIMIBE 10 MG PO TABS
10.0000 mg | ORAL_TABLET | Freq: Every day | ORAL | Status: DC
  Administered 2013-02-25: 10 mg via ORAL
  Filled 2013-02-25 (×2): qty 1

## 2013-02-25 MED ORDER — SODIUM CHLORIDE 0.9 % IV SOLN
INTRAVENOUS | Status: DC
  Administered 2013-02-25: 1000 mL via INTRAVENOUS

## 2013-02-25 MED ORDER — FENTANYL CITRATE 0.05 MG/ML IJ SOLN
INTRAMUSCULAR | Status: AC
  Filled 2013-02-25: qty 2

## 2013-02-25 MED ORDER — CARVEDILOL 6.25 MG PO TABS
6.2500 mg | ORAL_TABLET | Freq: Two times a day (BID) | ORAL | Status: DC
  Administered 2013-02-26: 6.25 mg via ORAL
  Filled 2013-02-25 (×4): qty 1

## 2013-02-25 MED ORDER — ACETAMINOPHEN 325 MG PO TABS
325.0000 mg | ORAL_TABLET | ORAL | Status: DC | PRN
  Administered 2013-02-25: 650 mg via ORAL
  Filled 2013-02-25: qty 2

## 2013-02-25 MED ORDER — MUPIROCIN 2 % EX OINT
TOPICAL_OINTMENT | Freq: Two times a day (BID) | CUTANEOUS | Status: DC
  Administered 2013-02-25: 07:00:00 via NASAL
  Filled 2013-02-25 (×2): qty 22

## 2013-02-25 MED ORDER — ASPIRIN EC 81 MG PO TBEC
81.0000 mg | DELAYED_RELEASE_TABLET | Freq: Every day | ORAL | Status: DC
  Filled 2013-02-25 (×2): qty 1

## 2013-02-25 MED ORDER — SODIUM CHLORIDE 0.9 % IV SOLN
INTRAVENOUS | Status: AC
  Administered 2013-02-25: 09:00:00 via INTRAVENOUS

## 2013-02-25 MED ORDER — HEPARIN (PORCINE) IN NACL 2-0.9 UNIT/ML-% IJ SOLN
INTRAMUSCULAR | Status: AC
  Filled 2013-02-25: qty 500

## 2013-02-25 MED ORDER — CEFAZOLIN SODIUM 1-5 GM-% IV SOLN
1.0000 g | Freq: Four times a day (QID) | INTRAVENOUS | Status: AC
  Administered 2013-02-25 – 2013-02-26 (×3): 1 g via INTRAVENOUS
  Filled 2013-02-25 (×3): qty 50

## 2013-02-25 MED ORDER — LISINOPRIL 2.5 MG PO TABS
2.5000 mg | ORAL_TABLET | Freq: Every day | ORAL | Status: DC
  Filled 2013-02-25 (×2): qty 1

## 2013-02-25 MED ORDER — ONDANSETRON HCL 4 MG/2ML IJ SOLN: 4.0000 mg | Freq: Four times a day (QID) | INTRAMUSCULAR | Status: DC | PRN

## 2013-02-25 MED ORDER — SIMVASTATIN 40 MG PO TABS
40.0000 mg | ORAL_TABLET | Freq: Every day | ORAL | Status: DC
  Administered 2013-02-25: 40 mg via ORAL
  Filled 2013-02-25 (×2): qty 1

## 2013-02-25 MED ORDER — MIDAZOLAM HCL 5 MG/5ML IJ SOLN
INTRAMUSCULAR | Status: AC
  Filled 2013-02-25: qty 5

## 2013-02-25 MED ORDER — HYDROCODONE-ACETAMINOPHEN 5-325 MG PO TABS
1.0000 | ORAL_TABLET | ORAL | Status: DC | PRN
  Administered 2013-02-25: 1 via ORAL
  Filled 2013-02-25: qty 1

## 2013-02-25 MED ORDER — ASPIRIN 81 MG PO TABS: 81.0000 mg | ORAL_TABLET | Freq: Every day | ORAL | Status: DC
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
## 2013-02-25 NOTE — CV Procedure (Signed)
[name] [id number]  [id number]  Preop Dx: ischemic and nonischemic cardiomyopathy Postop Dx same/   Procedure: single chamber ICD implantation with out intraoperative defibrillation threshold testing  Following the obtaining of informed consent the patient was brought to the electrophysiology laboratory in place of the fluoroscopic table in the supine position.  After routine prep and drape, lidocaine was infiltrated in the prepectoral subclavicular region and an incision was made and carried down to the layer of the prepectoral fascia using electrocautery and sharp dissection. A pocket was formed similarly.  Thereafter an attention was turned to gain access to the extrathoracic left subclavian vein which was accomplished  with difficulty and without the aspiration of air or puncture of the artery. A 9.5 French sheath was placed for which was then passed a  singlecoil  active fixation defibrillator lead, model Environmental manager   serial number [id number]. It was  passed under fluoroscopic guidance to the right ventricular apex. In its location the bipolar R wave was 6.6 millivolts, impedance was 853 ohms, the pacing threshold was 1.0 volts at 0.5 msec. Current at threshold was 1.2 mA.  There was no diaphragmatic pacing at 10 V. The current of injury was modest.  The lead was secured to the prepectoral fascia and then attached to a St Jude  ICD, serial number  [id number].  Through the device, the bipolar R wave was 11.3 millivolts, impedance was 730 ohms, the pacing threshold was 0.75 volts at 0.5 msec. High-voltage impedance of 68 ohms.  The pocket was copiously irrigated with antibiotic containing saline solution. Hemostasis was assured, and the device and the lead was placed in the pocket and secured to the prepectoral fascia.  The wound is closed in 3 layers in normal fashion. The wound is washed dried and a benzoin Steri-Strips dressing was then applied. Needle counts sponge counts and instrument  counts were correct at the end of the procedure according to the staff.       Cx: None      Sherryl Manges, MD 02/25/2013 9:03 AM

## 2013-02-25 NOTE — Progress Notes (Signed)
UR Completed Goldia Ligman Graves-Bigelow, RN,BSN [phone]  

## 2013-02-25 NOTE — Progress Notes (Signed)
CTSP for hypotension 65-70, diaphoresis and found to be bradycardic  He denies chest pain, neck veins are flat Given IV fluids and 1 mg atropine BP>>85-95  HR 50>>85  Denies CP SOB c/o being hungry Still sleepy Echo pending

## 2013-02-25 NOTE — Interval H&P Note (Signed)
ICD Criteria  Current LVEF (within 6 months):25%  NYHA Functional Classification: Class III  Heart Failure History:  Yes, Duration of heart failure since onset is > 9 months  Non-Ischemic Dilated Cardiomyopathy History:  No.  Atrial Fibrillation/Atrial Flutter:  No.  Ventricular Tachycardia History:  No.  Cardiac Arrest History:  No  History of Syndromes with Risk of Sudden Death:  No.  Previous ICD:  No.  Electrophysiology Study: No.  Anticoagulation Therapy:  Patient is NOT on anticoagulation therapy.   Beta Blocker Therapy:  Yes.   Ace Inhibitor/ARB Therapy:  Yes.History and Physical Interval Note:  02/25/2013 7:23 AM  [name]  has presented today for surgery, with the diagnosis of Heart faliure  The various methods of treatment have been discussed with the patient and family. After consideration of risks, benefits and other options for treatment, the patient has consented to  Procedure(s): IMPLANTABLE CARDIOVERTER DEFIBRILLATOR IMPLANT (N/A) as a surgical intervention .  The patient's history has been reviewed, patient examined, no change in status, stable for surgery.  I have reviewed the patient's chart and labs.  Questions were answered to the patient's satisfaction.     Sherryl Manges

## 2013-02-26 ENCOUNTER — Ambulatory Visit (HOSPITAL_COMMUNITY): Payer: Medicaid Other

## 2013-02-26 DIAGNOSIS — I2589 Other forms of chronic ischemic heart disease: Secondary | ICD-10-CM

## 2013-02-26 NOTE — Discharge Summary (Signed)
ELECTROPHYSIOLOGY PROCEDURE DISCHARGE SUMMARY    Patient ID: [name],  MRN: [id number], DOB/AGE: 07/18/1960 52 y.o.  Admit date: 02/25/2013 Discharge date: 02/27/2013  Primary Cardiologist: Olga Millers, MD  Primary Discharge Diagnosis:  Ischemic cardiomyopathy status post ICD implantation this admission  Secondary Discharge Diagnosis:  1.  Coronary artery disease s/p CABG in 1997; PCI in 2010 2.   Hyperlipidemia 3.  COPD 4.  Lung nodule - pending repeat evaluation January 2015  Procedures This Admission:  1.  Implantation of a single chamber defibrillator on 02-25-2013 by Dr Graciela Husbands.  The patient received a STJ ICD with model number 0181 right ventricular lead.  There were no early apparent complications.  DFT testing was deferred.  2.  CXR on 02-26-2013 demonstrated no pneumothorax status post device implant  Brief HPI: [name] is a 52 year old male with a history of ischemic cardiomyopathy despite optimal medical therapy.  He was referred for evaluation of ICD implantation.  Risks, benefits, and alternatives were reviewed with the patient who wished to proceed.   Hospital Course:  The patient was admitted and underwent implantation of a STJ single chamber ICD with details as outlined above.   He was monitored on telemetry overnight which demonstrated sinus rhythm with occasional PVC's.  Left chest was without hematoma or ecchymosis.  The device was interrogated and found to be functioning normally.  CXR was obtained and demonstrated no pneumothorax status post device implantation.  Wound care, arm mobility, and restrictions were reviewed with the patient.  Dr Graciela Husbands examined the patient and considered them stable for discharge to home.    Discharge Vitals: Blood pressure 106/55, pulse 73, temperature 97.8 F (36.6 C), temperature source Oral, resp. rate 18, height 5\' 8"  (1.727 m), weight 135 lb (61.236 kg), SpO2 96.00%.  Well developed and nourished in no acute  distress HENT normal Neck supple with JVP-flat Clear pocklet without hematoma Regular rate and rhythm, no murmurs or gallops Abd-soft with active BS No Clubbing cyanosis edema Skin-warm and dry A & Oriented  Grossly normal sensory and motor function   Labs:   Lab Results  Component Value Date   WBC 9.3 02/18/2013   HGB 14.0 02/18/2013   HCT 41.2 02/18/2013   MCV 86.1 02/18/2013   PLT 340.0 02/18/2013     Discharge Medications:    Medication List    ASK your doctor about these medications       aspirin 81 MG tablet  Take 81 mg by mouth daily.     buPROPion 150 MG 24 hr tablet  Commonly known as:  WELLBUTRIN XL  Take 150 mg by mouth daily.     carvedilol 6.25 MG tablet  Commonly known as:  COREG  Take 6.25 mg by mouth 2 (two) times daily with a meal.     ezetimibe 10 MG tablet  Commonly known as:  ZETIA  Take 10 mg by mouth daily.     lisinopril 2.5 MG tablet  Commonly known as:  PRINIVIL,ZESTRIL  Take 2.5 mg by mouth daily.     simvastatin 40 MG tablet  Commonly known as:  ZOCOR  Take 40 mg by mouth at bedtime.        Disposition:   Future Appointments Provider Department Dept Phone   03/11/2013 4:00 PM [facility] Office [phone]     Follow-up Information   Follow up with [employer] On 03/11/2013. (At 4:00 PM for wound check)  Specialty:  Electrophysiology   Contact information:   [address] [place] [address] [phone]      Follow up with Sherryl Manges, MD In 3 months. (Our office will call you with your appointment date and time)    Specialty:  Cardiology   Contact information:   [address] [place] [address] [phone]       Duration of Discharge Encounter: Greater than 30 minutes including physician time.  Signed,

## 2013-02-26 NOTE — Care Management Note (Signed)
    Page 1 of 1   02/26/2013     10:47:51 AM   CARE MANAGEMENT NOTE 02/26/2013  Patient:  [name], [name]   Account Number:  [id number]  Date Initiated:  02/26/2013  Documentation initiated by:  Donn Pierini  Subjective/Objective Assessment:   Pt admitted s/p single chamber ICD implantation     Action/Plan:   PTA pt lived at home   Anticipated DC Date:  02/26/2013   Anticipated DC Plan:  HOME/SELF CARE      DC Planning Services  CM consult      Choice offered to / List presented to:             Status of service:  Completed, signed off Medicare Important Message given?   (If response is "NO", the following Medicare IM given date fields will be blank) Date Medicare IM given:   Date Additional Medicare IM given:    Discharge Disposition:  HOME/SELF CARE  Per UR Regulation:  Reviewed for med. necessity/level of care/duration of stay  If discussed at Long Length of Stay Meetings, dates discussed:    Comments:  02/26/13- 0900- Donn Pierini RN, BSN [phone] Referral received for medication needs- pt was discharged home prior to this CM getting to unit- per unit RN- Belenda Cruise pt did not voice any needs with medications and was discharged home on meds that he was on prior to admission with no new meds added. No further CM f/u needed.

## 2013-03-11 ENCOUNTER — Ambulatory Visit (INDEPENDENT_AMBULATORY_CARE_PROVIDER_SITE_OTHER): Payer: Medicaid Other | Admitting: *Deleted

## 2013-03-11 DIAGNOSIS — I5022 Chronic systolic (congestive) heart failure: Secondary | ICD-10-CM

## 2013-03-11 DIAGNOSIS — I428 Other cardiomyopathies: Secondary | ICD-10-CM

## 2013-03-11 LAB — ICD DEVICE OBSERVATION
DEV-0020ICD: NEGATIVE
DEVICE MODEL ICD: 7132440
FVT: 0
PACEART VT: 0
RV LEAD AMPLITUDE: 11.3 mv
RV LEAD THRESHOLD: 0.5 V
TOT-0009: 0
TZAT-0001SLOWVT: 1
TZAT-0004SLOWVT: 8
TZAT-0019SLOWVT: 7.5 V
TZAT-0020SLOWVT: 1 ms
TZON-0004SLOWVT: 30
TZON-0010SLOWVT: 40 ms
TZST-0001SLOWVT: 2
TZST-0001SLOWVT: 3
TZST-0001SLOWVT: 4
TZST-0003SLOWVT: 800 V
TZST-0003SLOWVT: 875 V

## 2013-03-11 NOTE — Progress Notes (Signed)

## 2013-03-26 ENCOUNTER — Encounter: Payer: Self-pay | Admitting: Internal Medicine

## 2013-06-06 ENCOUNTER — Ambulatory Visit (INDEPENDENT_AMBULATORY_CARE_PROVIDER_SITE_OTHER)
Admission: RE | Admit: 2013-06-06 | Discharge: 2013-06-06 | Disposition: A | Discharge status: Home / Self Care | Payer: Medicaid Other | Source: Ambulatory Visit | Attending: Internal Medicine | Admitting: Internal Medicine

## 2013-06-06 DIAGNOSIS — R911 Solitary pulmonary nodule: Secondary | ICD-10-CM

## 2013-06-10 ENCOUNTER — Encounter: Payer: Self-pay | Admitting: Internal Medicine

## 2013-06-11 ENCOUNTER — Other Ambulatory Visit: Payer: Self-pay | Admitting: Internal Medicine

## 2013-06-11 DIAGNOSIS — R911 Solitary pulmonary nodule: Secondary | ICD-10-CM

## 2013-06-19 ENCOUNTER — Ambulatory Visit (HOSPITAL_COMMUNITY)
Admission: RE | Admit: 2013-06-19 | Discharge: 2013-06-19 | Disposition: A | Discharge status: Home / Self Care | Payer: Medicaid Other | Source: Ambulatory Visit | Attending: Internal Medicine | Admitting: Internal Medicine

## 2013-06-19 ENCOUNTER — Encounter (HOSPITAL_COMMUNITY): Payer: Self-pay

## 2013-06-19 DIAGNOSIS — N289 Disorder of kidney and ureter, unspecified: Secondary | ICD-10-CM | POA: Insufficient documentation

## 2013-06-19 DIAGNOSIS — I7090 Unspecified atherosclerosis: Secondary | ICD-10-CM | POA: Insufficient documentation

## 2013-06-19 DIAGNOSIS — J984 Other disorders of lung: Secondary | ICD-10-CM | POA: Insufficient documentation

## 2013-06-19 DIAGNOSIS — R911 Solitary pulmonary nodule: Secondary | ICD-10-CM

## 2013-06-19 DIAGNOSIS — J438 Other emphysema: Secondary | ICD-10-CM | POA: Insufficient documentation

## 2013-06-19 DIAGNOSIS — M5137 Other intervertebral disc degeneration, lumbosacral region: Secondary | ICD-10-CM | POA: Insufficient documentation

## 2013-06-19 DIAGNOSIS — M4310 Spondylolisthesis, site unspecified: Secondary | ICD-10-CM | POA: Insufficient documentation

## 2013-06-19 DIAGNOSIS — M51379 Other intervertebral disc degeneration, lumbosacral region without mention of lumbar back pain or lower extremity pain: Secondary | ICD-10-CM | POA: Insufficient documentation

## 2013-06-19 DIAGNOSIS — R599 Enlarged lymph nodes, unspecified: Secondary | ICD-10-CM | POA: Insufficient documentation

## 2013-06-19 DIAGNOSIS — N3289 Other specified disorders of bladder: Secondary | ICD-10-CM | POA: Insufficient documentation

## 2013-06-19 DIAGNOSIS — N400 Enlarged prostate without lower urinary tract symptoms: Secondary | ICD-10-CM | POA: Insufficient documentation

## 2013-06-19 LAB — GLUCOSE, CAPILLARY: Glucose-Capillary: 97 mg/dL (ref 70–99)

## 2013-06-19 MED ORDER — FLUDEOXYGLUCOSE F - 18 (FDG) INJECTION
18.4000 | Freq: Once | INTRAVENOUS | Status: AC | PRN
  Administered 2013-06-19: 18.4 via INTRAVENOUS

## 2013-06-25 ENCOUNTER — Telehealth: Payer: Self-pay | Admitting: Cardiology

## 2013-06-25 NOTE — Telephone Encounter (Signed)
New problem   Pt need letter stating how important it is for him to take his medicine so he can get a quicker hearing for his disability. Please call pt concerning this matter.

## 2013-06-25 NOTE — Telephone Encounter (Signed)
Spoke with pt, he has medicaid and has a co-pay for his medicine. He is trying to get disability and would like a letter stating how important it is for him to take his medicine so he can get a sooner appointment for the disability because he has no income to afford his meds. He would like this letter mailed to [facility] [employer], [address] [id number]. Will forward for dr Jacalyn Lefevre approval to write.

## 2013-06-26 NOTE — Telephone Encounter (Signed)
Patient needs to take meds; ok for letter Kirk Ruths

## 2013-07-02 ENCOUNTER — Telehealth: Payer: Self-pay | Admitting: Cardiology

## 2013-07-02 NOTE — Telephone Encounter (Signed)
New message         Pt returning nurses call

## 2013-07-02 NOTE — Telephone Encounter (Signed)
Pt requests a call back with the status of paperwork that is to go to his lawyer. Pt aware dr/nurse are out today and will be in tomorrow.

## 2013-07-03 ENCOUNTER — Encounter: Payer: Self-pay | Admitting: *Deleted

## 2013-07-03 NOTE — Telephone Encounter (Signed)
Letter complete. Left message for pt to call, questions if fax number I can send letter to.

## 2013-07-03 NOTE — Telephone Encounter (Signed)
See other telephone note.  

## 2013-07-03 NOTE — Telephone Encounter (Signed)
Spoke with pt, letter faxed to [phone]. Attn jackie wagoner.

## 2013-07-09 ENCOUNTER — Ambulatory Visit (INDEPENDENT_AMBULATORY_CARE_PROVIDER_SITE_OTHER): Payer: Medicaid Other | Admitting: Internal Medicine

## 2013-07-09 ENCOUNTER — Encounter: Payer: Self-pay | Admitting: Internal Medicine

## 2013-07-09 VITALS — BP 140/80 | HR 94 | Ht 68.0 in | Wt 133.6 lb

## 2013-07-09 DIAGNOSIS — R911 Solitary pulmonary nodule: Secondary | ICD-10-CM
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
## 2013-07-09 NOTE — Patient Instructions (Signed)
#  Smoking  - try to quit asap  #Chest pain  - d/w Cardiology  #COPD - continue spiriva as before  #lung Nodule Right Lower Lobe Paraspinal  - Arrange for IR guided biopsy by Dr [name] asap  #Followup  - after biopsy

## 2013-07-11 ENCOUNTER — Encounter: Payer: Self-pay | Admitting: Internal Medicine

## 2013-07-11 ENCOUNTER — Ambulatory Visit (INDEPENDENT_AMBULATORY_CARE_PROVIDER_SITE_OTHER): Payer: Medicaid Other | Admitting: Internal Medicine

## 2013-07-11 ENCOUNTER — Other Ambulatory Visit: Payer: Self-pay | Admitting: Radiology

## 2013-07-11 VITALS — BP 101/70 | HR 91 | Ht 67.0 in | Wt 133.0 lb

## 2013-07-11 DIAGNOSIS — Z95810 Presence of automatic (implantable) cardiac defibrillator: Secondary | ICD-10-CM

## 2013-07-11 DIAGNOSIS — I255 Ischemic cardiomyopathy: Secondary | ICD-10-CM

## 2013-07-11 DIAGNOSIS — I2589 Other forms of chronic ischemic heart disease: Secondary | ICD-10-CM

## 2013-07-11 DIAGNOSIS — I5022 Chronic systolic (congestive) heart failure: Secondary | ICD-10-CM

## 2013-07-11 DIAGNOSIS — Z4502 Encounter for adjustment and management of automatic implantable cardiac defibrillator: Secondary | ICD-10-CM | POA: Insufficient documentation

## 2013-07-11 LAB — MDC_IDC_ENUM_SESS_TYPE_INCLINIC
Battery Remaining Longevity: 98.4 mo
Brady Statistic RV Percent Paced: 0 %
Date Time Interrogation Session: 20150212120205
HighPow Impedance: 60.75 Ohm
Lead Channel Impedance Value: 412.5 Ohm
Lead Channel Pacing Threshold Amplitude: 0.5 V
Lead Channel Sensing Intrinsic Amplitude: 11.3 mV
Lead Channel Setting Sensing Sensitivity: 0.5 mV
MDC IDC MSMT LEADCHNL RV PACING THRESHOLD PULSEWIDTH: 0.5 ms
MDC IDC PG SERIAL: 7132440
MDC IDC SET LEADCHNL RV PACING AMPLITUDE: 2.5 V
MDC IDC SET LEADCHNL RV PACING PULSEWIDTH: 0.5 ms
MDC IDC SET ZONE DETECTION INTERVAL: 300 ms
Zone Setting Detection Interval: 250 ms

## 2013-07-11 NOTE — Patient Instructions (Signed)
Your physician recommends that you continue on your current medications as directed. Please refer to the Current Medication list given to you today.  Remote monitoring is used to monitor your Pacemaker of ICD from home. This monitoring reduces the number of office visits required to check your device to one time per year. It allows [place] to keep an eye on the functioning of your device to ensure it is working properly. You are scheduled for a device check from home on 10/14/13. You may send your transmission at any time that day. If you have a wireless device, the transmission will be sent automatically. After your physician reviews your transmission, you will receive a postcard with your next transmission date.  Your physician wants you to follow-up in: September with Ileene Hutchinson, PA-C.  You will receive a reminder letter in the mail two months in advance. If you don't receive a letter, please call our office to schedule the follow-up appointment.

## 2013-07-11 NOTE — Assessment & Plan Note (Signed)
The patient's device was interrogated.  The information was reviewed. No changes were made in the programming.    

## 2013-07-11 NOTE — Assessment & Plan Note (Signed)
Stable continue current meds  Difficult to add bidil 2/2 low blood pressure
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
## 2013-07-11 NOTE — Assessment & Plan Note (Signed)
euvolemic 

## 2013-07-15 ENCOUNTER — Encounter (HOSPITAL_COMMUNITY): Payer: Self-pay | Admitting: Pharmacy Technician

## 2013-07-18 ENCOUNTER — Encounter (HOSPITAL_COMMUNITY): Payer: Self-pay

## 2013-07-18 ENCOUNTER — Ambulatory Visit (HOSPITAL_COMMUNITY)
Admission: RE | Admit: 2013-07-18 | Discharge: 2013-07-18 | Disposition: A | Discharge status: Home / Self Care | Payer: Medicaid Other | Source: Ambulatory Visit | Attending: Internal Medicine | Admitting: Internal Medicine

## 2013-07-18 DIAGNOSIS — F17200 Nicotine dependence, unspecified, uncomplicated: Secondary | ICD-10-CM | POA: Insufficient documentation

## 2013-07-18 DIAGNOSIS — J449 Chronic obstructive pulmonary disease, unspecified: Secondary | ICD-10-CM | POA: Insufficient documentation

## 2013-07-18 DIAGNOSIS — Z8611 Personal history of tuberculosis: Secondary | ICD-10-CM | POA: Insufficient documentation

## 2013-07-18 DIAGNOSIS — C3430 Malignant neoplasm of lower lobe, unspecified bronchus or lung: Secondary | ICD-10-CM | POA: Insufficient documentation

## 2013-07-18 DIAGNOSIS — R911 Solitary pulmonary nodule: Secondary | ICD-10-CM

## 2013-07-18 DIAGNOSIS — J4489 Other specified chronic obstructive pulmonary disease: Secondary | ICD-10-CM | POA: Insufficient documentation

## 2013-07-18 LAB — CBC
HCT: 45.2 % (ref 39.0–52.0)
Hemoglobin: 15.9 g/dL (ref 13.0–17.0)
MCH: 28.6 pg (ref 26.0–34.0)
MCHC: 35.2 g/dL (ref 30.0–36.0)
MCV: 81.3 fL (ref 78.0–100.0)
PLATELETS: 293 10*3/uL (ref 150–400)
RBC: 5.56 MIL/uL (ref 4.22–5.81)
RDW: 15.6 % — AB (ref 11.5–15.5)
WBC: 7.1 10*3/uL (ref 4.0–10.5)

## 2013-07-18 LAB — PROTIME-INR
INR: 1.03 (ref 0.00–1.49)
PROTHROMBIN TIME: 13.3 s (ref 11.6–15.2)

## 2013-07-18 LAB — BASIC METABOLIC PANEL
BUN: 11 mg/dL (ref 6–23)
CALCIUM: 9.5 mg/dL (ref 8.4–10.5)
CO2: 22 mEq/L (ref 19–32)
Chloride: 101 mEq/L (ref 96–112)
Creatinine, Ser: 0.84 mg/dL (ref 0.50–1.35)
GFR calc non Af Amer: >90 mL/min (ref >90)
Glucose, Bld: 99 mg/dL (ref 70–99)
POTASSIUM: 4 meq/L (ref 3.7–5.3)
Sodium: 137 mEq/L (ref 137–147)

## 2013-07-18 LAB — APTT: APTT: 42 s — AB (ref 24–37)

## 2013-07-18 MED ORDER — MIDAZOLAM HCL 2 MG/2ML IJ SOLN
INTRAMUSCULAR | Status: AC | PRN
  Administered 2013-07-18 (×2): 1 mg via INTRAVENOUS

## 2013-07-18 MED ORDER — MIDAZOLAM HCL 2 MG/2ML IJ SOLN
INTRAMUSCULAR | Status: AC
  Filled 2013-07-18: qty 6

## 2013-07-18 MED ORDER — SODIUM CHLORIDE 0.9 % IV BOLUS (SEPSIS)
INTRAVENOUS | Status: AC | PRN
  Administered 2013-07-18: 250 mL via INTRAVENOUS

## 2013-07-18 MED ORDER — FENTANYL CITRATE 0.05 MG/ML IJ SOLN
INTRAMUSCULAR | Status: AC
  Filled 2013-07-18: qty 4

## 2013-07-18 MED ORDER — SODIUM CHLORIDE 0.9 % IV SOLN
Freq: Once | INTRAVENOUS | Status: AC
  Administered 2013-07-18: 10:00:00 via INTRAVENOUS

## 2013-07-18 MED ORDER — FENTANYL CITRATE 0.05 MG/ML IJ SOLN
INTRAMUSCULAR | Status: AC | PRN
  Administered 2013-07-18: 50 ug via INTRAVENOUS
  Administered 2013-07-18: 25 ug via INTRAVENOUS
  Administered 2013-07-18: 50 ug via INTRAVENOUS

## 2013-07-18 MED ORDER — LIDOCAINE HCL 1 % IJ SOLN
INTRAMUSCULAR | Status: AC
  Filled 2013-07-18: qty 10

## 2013-07-18 NOTE — Discharge Instructions (Signed)
Needle Biopsy of Lung, Care After Refer to this sheet in the next few weeks. These instructions provide you with information on caring for yourself after your procedure. Your health care provider may also give you more specific instructions. Your treatment has been planned according to current medical practices, but problems sometimes occur. Call your health care provider if you have any problems or questions after your procedure. WHAT TO EXPECT AFTER THE PROCEDURE A bandage will be applied over the areas where the needle was inserted. You may be asked to apply pressure to the bandage for several minutes to ensure there is minimal bleeding. In most cases, you can leave when your needle biopsy procedure is completed. Do not drive yourself home. Someone else should take you home. If you received an IV sedative or general anesthetic, you will be taken to a comfortable place to relax while the medication wears off. If you have upcoming travel scheduled, talk to your doctor about when it is safe to travel by air after the procedure. HOME CARE INSTRUCTIONS Expect to take it easy for the rest of the day. Protect the area where you received the needle biopsy by keeping the bandage in place for as long as instructed. You may feel some mild pain or discomfort in the area, but this should stop in a day or two. Only take over-the-counter or prescription medicines for pain, discomfort, or fever as directed by your caregiver. SEEK MEDICAL CARE IF:   You have pain at the biopsy site that worsens or is not helped by medication.  You have swelling or drainage at the needle biopsy site.  You have a fever. SEEK IMMEDIATE MEDICAL CARE IF:   You have new or worsening shortness of breath.  You have chest pain.  You are coughing up blood.  You have bleeding that does not stop with pressure or a bandage.  You develop light-headedness or fainting. Document Released: 03/13/2007 Document Revised: 01/16/2013 Document  Reviewed: 10/08/2012 [facility] Patient Information 2014 [place].
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
## 2013-07-18 NOTE — Procedures (Signed)
Interventional Radiology Procedure Note  Procedure: CT guided biopsy of RLL pulmonary nodule Complications: No immediate Recommendations: - Bedrest until CXR cleared.  Minimize talking, coughing or otherwise straining.  - Follow up 2 hr CXR pending   Signed,  Criselda Peaches, MD [facility]

## 2013-07-19 ENCOUNTER — Telehealth: Payer: Self-pay | Admitting: Internal Medicine

## 2013-07-19 ENCOUNTER — Encounter: Payer: Self-pay | Admitting: Internal Medicine

## 2013-07-19 NOTE — Telephone Encounter (Signed)
Just got call from pathology, biopsy shows adneoca well diferentiated. Need to give info to him ASAP. Please get him next few days. I openeed up clinic in PM. Face to face only. Do not give dx over phone  Sending to both Triage and JC due to urgency of matter

## 2013-07-19 NOTE — Telephone Encounter (Signed)
I spoke with the pt and set appt for Monday at 1:30pm. [place], [place]

## 2013-07-20 ENCOUNTER — Encounter: Payer: Self-pay | Admitting: Internal Medicine

## 2013-07-22 ENCOUNTER — Encounter: Payer: Self-pay | Admitting: Internal Medicine

## 2013-07-22 ENCOUNTER — Ambulatory Visit (INDEPENDENT_AMBULATORY_CARE_PROVIDER_SITE_OTHER): Payer: Medicaid Other | Admitting: Internal Medicine

## 2013-07-22 VITALS — BP 130/82 | HR 76 | Ht 68.0 in | Wt 136.0 lb

## 2013-07-22 DIAGNOSIS — C3490 Malignant neoplasm of unspecified part of unspecified bronchus or lung: Secondary | ICD-10-CM

## 2013-07-22 NOTE — Patient Instructions (Signed)
Yopu seem to have early lung cancer REfer [facility] [facility] where surgeon and radiation oncologist can see you  - referred through [facility] lung cancer navigator  Followup  3 months
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
## 2013-07-23 ENCOUNTER — Telehealth: Payer: Self-pay | Admitting: Internal Medicine

## 2013-07-23 DIAGNOSIS — C7931 Secondary malignant neoplasm of brain: Secondary | ICD-10-CM

## 2013-07-23 DIAGNOSIS — C3490 Malignant neoplasm of unspecified part of unspecified bronchus or lung: Secondary | ICD-10-CM

## 2013-07-23 NOTE — Telephone Encounter (Signed)
[name]  Got word from [facility]. They want the following as opd ASAP  1. CT brain with contrast - rule out brain mets 2. CPST test per Samule Dry - No need for EIB challenge - for lung cancer resection eligibility 3. Full PFT asap  Dr. Brand Males, M.D., [facility].C.P Pulmonary and Critical Care Medicine Staff Physician [facility] Pager: [phone], If no answer or between  15:00h - 7:00h: call [phone]  07/23/2013 10:55 AM

## 2013-07-23 NOTE — Telephone Encounter (Signed)
Orders have been placed. Pt is aware that we will contact him about the times and places of the tests once they are scheduled.

## 2013-07-23 NOTE — Telephone Encounter (Signed)
Sure can wait till 07/24/13

## 2013-07-23 NOTE — Telephone Encounter (Signed)
None of the PCC's are here today. Can this wait until tomorrow?

## 2013-07-23 NOTE — Telephone Encounter (Signed)
Sending to triage because I do no think jennifer is in office

## 2013-07-23 NOTE — Assessment & Plan Note (Signed)
He has 64mm RLL paraspinal mass. S/p IR bx shows AdenoCA lung. I am not sure about resectability; he has good fev1 but poor DLCO and has contralateral upper lobe fibrosis on LUL due to prior TB. He might or might need CPST. Rx is either resection or local radiation. XRT might carry risks due to location.  Refer him to [facility] so they can make a multidisciplinary decision  Explained above to him  (> 50% of this 15 min visit spent in face to face counseling)

## 2013-07-29 ENCOUNTER — Ambulatory Visit (HOSPITAL_COMMUNITY): Payer: Medicaid Other

## 2013-07-29 ENCOUNTER — Telehealth: Payer: Self-pay | Admitting: *Deleted

## 2013-07-29 ENCOUNTER — Ambulatory Visit (INDEPENDENT_AMBULATORY_CARE_PROVIDER_SITE_OTHER): Payer: Medicaid Other | Admitting: Cardiology

## 2013-07-29 ENCOUNTER — Encounter: Payer: Self-pay | Admitting: Cardiology

## 2013-07-29 ENCOUNTER — Ambulatory Visit: Payer: Self-pay | Admitting: Cardiology

## 2013-07-29 VITALS — BP 122/70 | HR 49 | Ht 68.0 in | Wt 133.0 lb

## 2013-07-29 DIAGNOSIS — C3490 Malignant neoplasm of unspecified part of unspecified bronchus or lung: Secondary | ICD-10-CM

## 2013-07-29 DIAGNOSIS — Z01810 Encounter for preprocedural cardiovascular examination: Secondary | ICD-10-CM | POA: Insufficient documentation

## 2013-07-29 DIAGNOSIS — I2510 Atherosclerotic heart disease of native coronary artery without angina pectoris: Secondary | ICD-10-CM

## 2013-07-29 DIAGNOSIS — F17200 Nicotine dependence, unspecified, uncomplicated: Secondary | ICD-10-CM

## 2013-07-29 MED ORDER — LISINOPRIL 5 MG PO TABS: 5.0000 mg | ORAL_TABLET | Freq: Two times a day (BID) | ORAL | Status: DC

## 2013-07-29 NOTE — Assessment & Plan Note (Signed)
Patient may proceed with resection of his lung cancer. I do not think he requires further ischemia evaluation preoperatively. He can walk a quarter of a mile with no dyspnea or chest pain.

## 2013-07-29 NOTE — Assessment & Plan Note (Signed)
Continue Coreg. Increase lisinopril to 5 mg by mouth twice a day. Check potassium and renal function in one week.

## 2013-07-29 NOTE — Telephone Encounter (Signed)
Called left vm message to call with [place] appt time and date.

## 2013-07-29 NOTE — Assessment & Plan Note (Signed)
Patient counseled on discontinuing. 

## 2013-07-29 NOTE — Assessment & Plan Note (Signed)
Continue statin. 
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
## 2013-07-29 NOTE — Assessment & Plan Note (Signed)
Followed by electrophysiology. 

## 2013-07-29 NOTE — Patient Instructions (Signed)
Your physician wants you to follow-up in: [place] will receive a reminder letter in the mail two months in advance. If you don't receive a letter, please call our office to schedule the follow-up appointment.   INCREASE LISINOPRIL TO 5 MG TWICE DAILY  Your physician recommends that you return for lab work in: [place]

## 2013-07-29 NOTE — Assessment & Plan Note (Signed)
Continue aspirin and statin. 

## 2013-07-30 ENCOUNTER — Ambulatory Visit (HOSPITAL_COMMUNITY)
Admission: RE | Admit: 2013-07-30 | Discharge: 2013-07-30 | Disposition: A | Discharge status: Home / Self Care | Payer: Medicaid Other | Source: Ambulatory Visit | Attending: Internal Medicine | Admitting: Internal Medicine

## 2013-07-30 ENCOUNTER — Telehealth: Payer: Self-pay | Admitting: *Deleted

## 2013-07-30 ENCOUNTER — Encounter (HOSPITAL_COMMUNITY): Payer: Self-pay

## 2013-07-30 DIAGNOSIS — C7931 Secondary malignant neoplasm of brain: Secondary | ICD-10-CM

## 2013-07-30 DIAGNOSIS — C3490 Malignant neoplasm of unspecified part of unspecified bronchus or lung: Secondary | ICD-10-CM

## 2013-07-30 LAB — PULMONARY FUNCTION TEST
DL/VA % pred: 47 %
DL/VA: 2.13 ml/min/mmHg/L
DLCO UNC % PRED: 33 %
DLCO unc: 9.58 ml/min/mmHg
FEF 25-75 PRE: 1.26 L/s
FEF 25-75 Post: 1.28 L/sec
FEF2575-%Change-Post: 1 %
FEF2575-%Pred-Post: 41 %
FEF2575-%Pred-Pre: 41 %
FEV1-%CHANGE-POST: 0 %
FEV1-%Pred-Post: 78 %
FEV1-%Pred-Pre: 78 %
FEV1-PRE: 2.38 L
FEV1-Post: 2.39 L
FEV1FVC-%Change-Post: 2 %
FEV1FVC-%Pred-Pre: 80 %
FEV6-%Change-Post: -1 %
FEV6-%Pred-Post: 97 %
FEV6-%Pred-Pre: 99 %
FEV6-POST: 3.6 L
FEV6-PRE: 3.66 L
FEV6FVC-%CHANGE-POST: 0 %
FEV6FVC-%PRED-POST: 102 %
FEV6FVC-%PRED-PRE: 101 %
FVC-%Change-Post: -2 %
FVC-%PRED-PRE: 97 %
FVC-%Pred-Post: 95 %
FVC-PRE: 3.71 L
FVC-Post: 3.63 L
PRE FEV6/FVC RATIO: 99 %
Post FEV1/FVC ratio: 66 %
Post FEV6/FVC ratio: 99 %
Pre FEV1/FVC ratio: 64 %
RV % PRED: 86 %
RV: 1.65 L
TLC % pred: 81 %
TLC: 5.28 L

## 2013-07-30 MED ORDER — IOHEXOL 300 MG/ML  SOLN
100.0000 mL | Freq: Once | INTRAMUSCULAR | Status: AC | PRN
  Administered 2013-07-30: 100 mL via INTRAVENOUS

## 2013-07-30 MED ORDER — ALBUTEROL SULFATE (2.5 MG/3ML) 0.083% IN NEBU
2.5000 mg | INHALATION_SOLUTION | Freq: Once | RESPIRATORY_TRACT | Status: AC
  Administered 2013-07-30: 2.5 mg via RESPIRATORY_TRACT

## 2013-07-30 NOTE — Telephone Encounter (Signed)
Called left vm message regarding appt for [place] 08/01/13 at 3:00 arrival at 2:45.  I left my name and phone number to call with any questions

## 2013-08-01 ENCOUNTER — Ambulatory Visit: Payer: Medicaid Other | Attending: Radiation Oncology | Admitting: Physical Therapy

## 2013-08-01 ENCOUNTER — Encounter: Payer: Medicaid Other | Admitting: Thoracic Surgery (Cardiothoracic Vascular Surgery)

## 2013-08-01 ENCOUNTER — Ambulatory Visit
Admission: RE | Admit: 2013-08-01 | Discharge: 2013-08-01 | Disposition: A | Discharge status: Home / Self Care | Payer: Medicaid Other | Source: Ambulatory Visit | Attending: Radiation Oncology | Admitting: Radiation Oncology

## 2013-08-01 ENCOUNTER — Encounter: Payer: Self-pay | Admitting: *Deleted

## 2013-08-01 ENCOUNTER — Encounter: Payer: Self-pay | Admitting: Radiation Oncology

## 2013-08-01 ENCOUNTER — Telehealth: Payer: Self-pay | Admitting: *Deleted

## 2013-08-01 DIAGNOSIS — IMO0001 Reserved for inherently not codable concepts without codable children: Secondary | ICD-10-CM | POA: Insufficient documentation

## 2013-08-01 DIAGNOSIS — Z01811 Encounter for preprocedural respiratory examination: Secondary | ICD-10-CM

## 2013-08-01 DIAGNOSIS — R293 Abnormal posture: Secondary | ICD-10-CM | POA: Insufficient documentation

## 2013-08-01 DIAGNOSIS — C3490 Malignant neoplasm of unspecified part of unspecified bronchus or lung: Secondary | ICD-10-CM

## 2013-08-01 NOTE — Telephone Encounter (Signed)
Called pt.  Scheduled for Rad Onc at 3:00 and Thoracic Surgery at 4:00.  He verbalized understanding of appt time and place.

## 2013-08-01 NOTE — [facility] Oncology Navigator Note (Unsigned)
Spoke with patient at [facility] today.  We discussed concerns about lung cancer diagnosis.  I listened as he stated this is serious. I gave him NCI booklet on lung cancer.  We also spoke about smoking cessation.  He stated he was going to quit.  I told him I am here to help if he has difficulty with it.
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
## 2013-08-02 ENCOUNTER — Encounter: Payer: Self-pay | Admitting: Thoracic Surgery (Cardiothoracic Vascular Surgery)

## 2013-08-02 ENCOUNTER — Institutional Professional Consult (permissible substitution) (INDEPENDENT_AMBULATORY_CARE_PROVIDER_SITE_OTHER): Payer: Medicaid Other | Admitting: Thoracic Surgery (Cardiothoracic Vascular Surgery)

## 2013-08-02 ENCOUNTER — Other Ambulatory Visit: Payer: Self-pay | Admitting: *Deleted

## 2013-08-02 ENCOUNTER — Encounter: Payer: Self-pay | Admitting: *Deleted

## 2013-08-02 VITALS — BP 117/72 | HR 61 | Resp 16 | Ht 68.0 in | Wt 135.0 lb

## 2013-08-02 DIAGNOSIS — C3410 Malignant neoplasm of upper lobe, unspecified bronchus or lung: Secondary | ICD-10-CM

## 2013-08-02 DIAGNOSIS — J449 Chronic obstructive pulmonary disease, unspecified: Secondary | ICD-10-CM

## 2013-08-02 DIAGNOSIS — I2510 Atherosclerotic heart disease of native coronary artery without angina pectoris: Secondary | ICD-10-CM

## 2013-08-02 DIAGNOSIS — C801 Malignant (primary) neoplasm, unspecified: Secondary | ICD-10-CM
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
## 2013-08-05 ENCOUNTER — Encounter (HOSPITAL_COMMUNITY): Payer: Self-pay | Admitting: Pharmacy Technician

## 2013-08-06 ENCOUNTER — Ambulatory Visit (HOSPITAL_COMMUNITY)
Admission: RE | Admit: 2013-08-06 | Discharge: 2013-08-06 | Disposition: A | Discharge status: Home / Self Care | Payer: Medicaid Other | Source: Ambulatory Visit | Attending: Thoracic Surgery (Cardiothoracic Vascular Surgery) | Admitting: Thoracic Surgery (Cardiothoracic Vascular Surgery)

## 2013-08-06 ENCOUNTER — Encounter (HOSPITAL_COMMUNITY)
Admission: RE | Admit: 2013-08-06 | Discharge: 2013-08-06 | Disposition: A | Discharge status: Home / Self Care | Payer: Medicaid Other | Source: Ambulatory Visit | Attending: Thoracic Surgery (Cardiothoracic Vascular Surgery) | Admitting: Thoracic Surgery (Cardiothoracic Vascular Surgery)

## 2013-08-06 ENCOUNTER — Encounter (HOSPITAL_COMMUNITY): Payer: Self-pay

## 2013-08-06 ENCOUNTER — Other Ambulatory Visit (HOSPITAL_COMMUNITY): Payer: Self-pay | Admitting: *Deleted

## 2013-08-06 VITALS — BP 105/72 | HR 75 | Temp 97.6°F | Resp 18 | Ht 67.0 in | Wt 130.6 lb

## 2013-08-06 DIAGNOSIS — J9819 Other pulmonary collapse: Secondary | ICD-10-CM

## 2013-08-06 DIAGNOSIS — Z01818 Encounter for other preprocedural examination: Secondary | ICD-10-CM

## 2013-08-06 DIAGNOSIS — R911 Solitary pulmonary nodule: Secondary | ICD-10-CM | POA: Insufficient documentation

## 2013-08-06 DIAGNOSIS — Z87891 Personal history of nicotine dependence: Secondary | ICD-10-CM

## 2013-08-06 DIAGNOSIS — J449 Chronic obstructive pulmonary disease, unspecified: Secondary | ICD-10-CM | POA: Insufficient documentation

## 2013-08-06 DIAGNOSIS — J4489 Other specified chronic obstructive pulmonary disease: Secondary | ICD-10-CM | POA: Insufficient documentation

## 2013-08-06 DIAGNOSIS — C801 Malignant (primary) neoplasm, unspecified: Secondary | ICD-10-CM

## 2013-08-06 DIAGNOSIS — Z01812 Encounter for preprocedural laboratory examination: Secondary | ICD-10-CM | POA: Insufficient documentation

## 2013-08-06 HISTORY — DX: Headache: R51

## 2013-08-06 LAB — PROTIME-INR
INR: 1.04 (ref 0.00–1.49)
PROTHROMBIN TIME: 13.4 s (ref 11.6–15.2)

## 2013-08-06 LAB — TYPE AND SCREEN
ABO/RH(D): O POS
ANTIBODY SCREEN: NEGATIVE

## 2013-08-06 LAB — SURGICAL PCR SCREEN
MRSA, PCR: NEGATIVE
Staphylococcus aureus: NEGATIVE

## 2013-08-06 LAB — BLOOD GAS, ARTERIAL
Acid-Base Excess: 0.9 mmol/L (ref 0.0–2.0)
BICARBONATE: 24.5 meq/L — AB (ref 20.0–24.0)
FIO2: 0.21 %
O2 SAT: 92.6 %
Patient temperature: 98.6
TCO2: 25.6 mmol/L (ref 0–100)
pCO2 arterial: 36.2 mmHg (ref 35.0–45.0)
pH, Arterial: 7.446 (ref 7.350–7.450)
pO2, Arterial: 65.1 mmHg — ABNORMAL LOW (ref 80.0–100.0)

## 2013-08-06 LAB — URINALYSIS, ROUTINE W REFLEX MICROSCOPIC
BILIRUBIN URINE: NEGATIVE
GLUCOSE, UA: NEGATIVE mg/dL
HGB URINE DIPSTICK: NEGATIVE
Ketones, ur: NEGATIVE mg/dL
Leukocytes, UA: NEGATIVE
Nitrite: NEGATIVE
Protein, ur: NEGATIVE mg/dL
Specific Gravity, Urine: 1.014 (ref 1.005–1.030)
UROBILINOGEN UA: 0.2 mg/dL (ref 0.0–1.0)
pH: 7 (ref 5.0–8.0)

## 2013-08-06 LAB — ABO/RH: ABO/RH(D): O POS

## 2013-08-06 LAB — COMPREHENSIVE METABOLIC PANEL
ALT: 33 U/L (ref 0–53)
AST: 26 U/L (ref 0–37)
Albumin: 4 g/dL (ref 3.5–5.2)
Alkaline Phosphatase: 73 U/L (ref 39–117)
BUN: 12 mg/dL (ref 6–23)
CALCIUM: 9.6 mg/dL (ref 8.4–10.5)
CO2: 22 meq/L (ref 19–32)
Chloride: 103 mEq/L (ref 96–112)
Creatinine, Ser: 0.88 mg/dL (ref 0.50–1.35)
GLUCOSE: 102 mg/dL — AB (ref 70–99)
Potassium: 5.5 mEq/L — ABNORMAL HIGH (ref 3.7–5.3)
Sodium: 137 mEq/L (ref 137–147)
Total Bilirubin: 0.5 mg/dL (ref 0.3–1.2)
Total Protein: 8.4 g/dL — ABNORMAL HIGH (ref 6.0–8.3)

## 2013-08-06 LAB — APTT: APTT: 45 s — AB (ref 24–37)

## 2013-08-06 LAB — CBC
HEMATOCRIT: 43.5 % (ref 39.0–52.0)
HEMOGLOBIN: 15.8 g/dL (ref 13.0–17.0)
MCH: 29.5 pg (ref 26.0–34.0)
MCHC: 36.3 g/dL — AB (ref 30.0–36.0)
MCV: 81.2 fL (ref 78.0–100.0)
Platelets: 302 10*3/uL (ref 150–400)
RBC: 5.36 MIL/uL (ref 4.22–5.81)
RDW: 14.4 % (ref 11.5–15.5)
WBC: 8.6 10*3/uL (ref 4.0–10.5)

## 2013-08-06 MED ORDER — DEXTROSE 5 % IV SOLN
1.5000 g | INTRAVENOUS | Status: AC
  Administered 2013-08-07: 1.5 g via INTRAVENOUS
  Filled 2013-08-06: qty 1.5

## 2013-08-06 NOTE — Progress Notes (Signed)
Abnormal lab results called to Dr. Leonarda Salon office. Spoke with Henrine Screws, RN. States that she will give the results to Dr. Roxan Hockey.

## 2013-08-06 NOTE — Progress Notes (Signed)
Spoke with Jolene from office. Dr. Roxan Hockey has seen labs and is not going to take any action.  They will proceed with surgery as scheduled.  DA

## 2013-08-06 NOTE — Progress Notes (Signed)
[employer] rep., Darlina Guys notified that pt's ICD will likely interfere with procedure. She was given pt's name and time of surgery. She states he is on their schedule now and someone will be here from [place] to disable the ICD.

## 2013-08-06 NOTE — Pre-Procedure Instructions (Signed)
[name]  08/06/2013   Your procedure is scheduled on:  Wednesday, August 07, 2013 at 1:30 PM.   Report to [facility] Entrance "A" Admitting Office at 8:30 AM.   Call this number if you have problems the morning of surgery: [phone]   Remember:   Do not eat food or drink liquids after midnight tonight.   Take these medicines the morning of surgery with A SIP OF WATER: buPROPion (WELLBUTRIN XL), carvedilol (COREG), tiotropium (SPIRIVA)    Do not wear jewelry.  Do not wear lotions, powders, or cologne. You may NOT wear deodorant.  Men may shave face and neck.  Do not bring valuables to the hospital.  [facility] is not responsible                  for any belongings or valuables.               Contacts, dentures or bridgework may not be worn into surgery.  Leave suitcase in the car. After surgery it may be brought to your room.  For patients admitted to the hospital, discharge time is determined by your                treatment team.           Special Instructions: [place] - Preparing for Surgery  Before surgery, you can play an important role.  Because skin is not sterile, your skin needs to be as free of germs as possible.  You can reduce the number of germs on you skin by washing with CHG (chlorahexidine gluconate) soap before surgery.  CHG is an antiseptic cleaner which kills germs and bonds with the skin to continue killing germs even after washing.  Please DO NOT use if you have an allergy to CHG or antibacterial soaps.  If your skin becomes reddened/irritated stop using the CHG and inform your nurse when you arrive at Short Stay.  Do not shave (including legs and underarms) for at least 48 hours prior to the first CHG shower.  You may shave your face.  Please follow these instructions carefully:   1.  Shower with CHG Soap the night before surgery and the                                morning of Surgery.  2.  If you choose to wash your hair, wash your  hair first as usual with your       normal shampoo.  3.  After you shampoo, rinse your hair and body thoroughly to remove the                      Shampoo.  4.  Use CHG as you would any other liquid soap.  You can apply chg directly       to the skin and wash gently with scrungie or a clean washcloth.  5.  Apply the CHG Soap to your body ONLY FROM THE NECK DOWN.        Do not use on open wounds or open sores.  Avoid contact with your eyes, ears, mouth and genitals (private parts).  Wash genitals (private parts) with your normal soap.  6.  Wash thoroughly, paying special attention to the area where your surgery        will be performed.  7.  Thoroughly rinse your body with warm water  from the neck down.  8.  DO NOT shower/wash with your normal soap after using and rinsing off       the CHG Soap.  9.  Pat yourself dry with a clean towel.            10.  Wear clean pajamas.            11.  Place clean sheets on your bed the night of your first shower and do not        sleep with pets.  Day of Surgery  Do not apply any lotions/deodorants the morning of surgery.  Please wear clean clothes to the hospital/surgery center.     Please read over the following fact sheets that you were given: Pain Booklet, Coughing and Deep Breathing, Blood Transfusion Information, MRSA Information and Surgical Site Infection Prevention
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
## 2013-08-07 ENCOUNTER — Encounter (HOSPITAL_COMMUNITY)
Admission: RE | Disposition: A | Discharge status: Home Health | Payer: Self-pay | Source: Ambulatory Visit | Attending: Thoracic Surgery (Cardiothoracic Vascular Surgery)

## 2013-08-07 ENCOUNTER — Encounter (HOSPITAL_COMMUNITY): Payer: Self-pay | Admitting: Certified Registered Nurse Anesthetist

## 2013-08-07 ENCOUNTER — Encounter (HOSPITAL_COMMUNITY): Payer: Medicaid Other | Admitting: Vascular Surgery

## 2013-08-07 ENCOUNTER — Other Ambulatory Visit: Payer: Medicaid Other

## 2013-08-07 ENCOUNTER — Inpatient Hospital Stay (HOSPITAL_COMMUNITY)
Admission: RE | Admit: 2013-08-07 | Discharge: 2013-08-16 | DRG: 164 | Disposition: A | Discharge status: Home Health | Payer: Medicaid Other | Source: Ambulatory Visit | Attending: Thoracic Surgery (Cardiothoracic Vascular Surgery) | Admitting: Thoracic Surgery (Cardiothoracic Vascular Surgery)

## 2013-08-07 ENCOUNTER — Inpatient Hospital Stay (HOSPITAL_COMMUNITY): Payer: Medicaid Other

## 2013-08-07 ENCOUNTER — Inpatient Hospital Stay (HOSPITAL_COMMUNITY): Payer: Medicaid Other | Admitting: Certified Registered Nurse Anesthetist

## 2013-08-07 ENCOUNTER — Other Ambulatory Visit (HOSPITAL_COMMUNITY): Payer: Medicaid Other

## 2013-08-07 DIAGNOSIS — Z79899 Other long term (current) drug therapy: Secondary | ICD-10-CM

## 2013-08-07 DIAGNOSIS — I252 Old myocardial infarction: Secondary | ICD-10-CM

## 2013-08-07 DIAGNOSIS — Z951 Presence of aortocoronary bypass graft: Secondary | ICD-10-CM

## 2013-08-07 DIAGNOSIS — C3430 Malignant neoplasm of lower lobe, unspecified bronchus or lung: Principal | ICD-10-CM | POA: Diagnosis present

## 2013-08-07 DIAGNOSIS — I10 Essential (primary) hypertension: Secondary | ICD-10-CM | POA: Diagnosis present

## 2013-08-07 DIAGNOSIS — R918 Other nonspecific abnormal finding of lung field: Secondary | ICD-10-CM | POA: Diagnosis present

## 2013-08-07 DIAGNOSIS — Z8611 Personal history of tuberculosis: Secondary | ICD-10-CM

## 2013-08-07 DIAGNOSIS — I498 Other specified cardiac arrhythmias: Secondary | ICD-10-CM | POA: Diagnosis not present

## 2013-08-07 DIAGNOSIS — Z7982 Long term (current) use of aspirin: Secondary | ICD-10-CM

## 2013-08-07 DIAGNOSIS — D62 Acute posthemorrhagic anemia: Secondary | ICD-10-CM | POA: Diagnosis not present

## 2013-08-07 DIAGNOSIS — Z95810 Presence of automatic (implantable) cardiac defibrillator: Secondary | ICD-10-CM

## 2013-08-07 DIAGNOSIS — F329 Major depressive disorder, single episode, unspecified: Secondary | ICD-10-CM | POA: Diagnosis present

## 2013-08-07 DIAGNOSIS — F3289 Other specified depressive episodes: Secondary | ICD-10-CM | POA: Diagnosis present

## 2013-08-07 DIAGNOSIS — I2589 Other forms of chronic ischemic heart disease: Secondary | ICD-10-CM | POA: Diagnosis present

## 2013-08-07 DIAGNOSIS — F17200 Nicotine dependence, unspecified, uncomplicated: Secondary | ICD-10-CM | POA: Diagnosis present

## 2013-08-07 DIAGNOSIS — J9383 Other pneumothorax: Secondary | ICD-10-CM | POA: Diagnosis not present

## 2013-08-07 DIAGNOSIS — E785 Hyperlipidemia, unspecified: Secondary | ICD-10-CM | POA: Diagnosis present

## 2013-08-07 DIAGNOSIS — J9382 Other air leak: Secondary | ICD-10-CM | POA: Diagnosis not present

## 2013-08-07 DIAGNOSIS — I509 Heart failure, unspecified: Secondary | ICD-10-CM | POA: Diagnosis present

## 2013-08-07 DIAGNOSIS — I2510 Atherosclerotic heart disease of native coronary artery without angina pectoris: Secondary | ICD-10-CM | POA: Diagnosis present

## 2013-08-07 DIAGNOSIS — C801 Malignant (primary) neoplasm, unspecified: Secondary | ICD-10-CM

## 2013-08-07 DIAGNOSIS — Z8249 Family history of ischemic heart disease and other diseases of the circulatory system: Secondary | ICD-10-CM

## 2013-08-07 HISTORY — PX: LYMPH NODE DISSECTION: SHX5087

## 2013-08-07 HISTORY — PX: VIDEO ASSISTED THORACOSCOPY (VATS)/WEDGE RESECTION: SHX6174

## 2013-08-07 LAB — GLUCOSE, CAPILLARY
GLUCOSE-CAPILLARY: 118 mg/dL — AB (ref 70–99)
Glucose-Capillary: 141 mg/dL — ABNORMAL HIGH (ref 70–99)

## 2013-08-07 SURGERY — VIDEO ASSISTED THORACOSCOPY (VATS)/WEDGE RESECTION
Anesthesia: General | Site: Chest | Laterality: Right

## 2013-08-07 MED ORDER — DIPHENHYDRAMINE HCL 50 MG/ML IJ SOLN
12.5000 mg | Freq: Four times a day (QID) | INTRAMUSCULAR | Status: DC | PRN
  Filled 2013-08-07: qty 0.25

## 2013-08-07 MED ORDER — FENTANYL CITRATE 0.05 MG/ML IJ SOLN
INTRAMUSCULAR | Status: DC | PRN
  Administered 2013-08-07 (×3): 25 ug via INTRAVENOUS
  Administered 2013-08-07 (×4): 50 ug via INTRAVENOUS
  Administered 2013-08-07: 25 ug via INTRAVENOUS

## 2013-08-07 MED ORDER — GLYCOPYRROLATE 0.2 MG/ML IJ SOLN
INTRAMUSCULAR | Status: AC
  Filled 2013-08-07: qty 4

## 2013-08-07 MED ORDER — PHENYLEPHRINE HCL 10 MG/ML IJ SOLN
INTRAMUSCULAR | Status: DC | PRN
  Administered 2013-08-07 (×4): 40 ug via INTRAVENOUS

## 2013-08-07 MED ORDER — OXYCODONE HCL 5 MG PO TABS: 5.0000 mg | ORAL_TABLET | ORAL | Status: AC | PRN

## 2013-08-07 MED ORDER — SIMVASTATIN 40 MG PO TABS
40.0000 mg | ORAL_TABLET | Freq: Every day | ORAL | Status: DC
  Administered 2013-08-07 – 2013-08-15 (×9): 40 mg via ORAL
  Filled 2013-08-07 (×11): qty 1

## 2013-08-07 MED ORDER — INSULIN ASPART 100 UNIT/ML ~~LOC~~ SOLN
0.0000 [IU] | Freq: Four times a day (QID) | SUBCUTANEOUS | Status: DC
  Administered 2013-08-08: 2 [IU] via SUBCUTANEOUS

## 2013-08-07 MED ORDER — FENTANYL CITRATE 0.05 MG/ML IJ SOLN
25.0000 ug | INTRAMUSCULAR | Status: DC | PRN
  Administered 2013-08-07 (×2): 50 ug via INTRAVENOUS

## 2013-08-07 MED ORDER — BUPIVACAINE 0.25 % ON-Q PUMP SINGLE CATH 300ML
300.0000 mL | INJECTION | Status: DC
  Filled 2013-08-07: qty 300

## 2013-08-07 MED ORDER — PROPOFOL 10 MG/ML IV BOLUS
INTRAVENOUS | Status: DC | PRN
  Administered 2013-08-07: 30 mg via INTRAVENOUS
  Administered 2013-08-07: 100 mg via INTRAVENOUS

## 2013-08-07 MED ORDER — ROCURONIUM BROMIDE 100 MG/10ML IV SOLN
INTRAVENOUS | Status: DC | PRN
  Administered 2013-08-07: 50 mg via INTRAVENOUS
  Administered 2013-08-07: 30 mg via INTRAVENOUS
  Administered 2013-08-07: 20 mg via INTRAVENOUS

## 2013-08-07 MED ORDER — LIDOCAINE HCL (CARDIAC) 20 MG/ML IV SOLN
INTRAVENOUS | Status: AC
  Filled 2013-08-07: qty 5

## 2013-08-07 MED ORDER — FENTANYL CITRATE 0.05 MG/ML IJ SOLN
50.0000 ug | INTRAMUSCULAR | Status: DC | PRN
  Filled 2013-08-07: qty 2

## 2013-08-07 MED ORDER — MIDAZOLAM HCL 2 MG/2ML IJ SOLN
1.0000 mg | INTRAMUSCULAR | Status: DC | PRN
  Filled 2013-08-07: qty 2

## 2013-08-07 MED ORDER — PHENYLEPHRINE HCL 10 MG/ML IJ SOLN
10.0000 mg | INTRAVENOUS | Status: DC | PRN
  Administered 2013-08-07: 20 ug/min via INTRAVENOUS

## 2013-08-07 MED ORDER — ROCURONIUM BROMIDE 50 MG/5ML IV SOLN
INTRAVENOUS | Status: AC
  Filled 2013-08-07: qty 1

## 2013-08-07 MED ORDER — ASPIRIN EC 81 MG PO TBEC
81.0000 mg | DELAYED_RELEASE_TABLET | Freq: Every day | ORAL | Status: DC
  Administered 2013-08-08 – 2013-08-16 (×9): 81 mg via ORAL
  Filled 2013-08-07 (×9): qty 1

## 2013-08-07 MED ORDER — DEXTROSE-NACL 5-0.9 % IV SOLN
INTRAVENOUS | Status: DC
  Administered 2013-08-07 – 2013-08-08 (×2): via INTRAVENOUS

## 2013-08-07 MED ORDER — MIDAZOLAM HCL 5 MG/5ML IJ SOLN
INTRAMUSCULAR | Status: DC | PRN
  Administered 2013-08-07 (×2): 1 mg via INTRAVENOUS

## 2013-08-07 MED ORDER — ARTIFICIAL TEARS OP OINT
TOPICAL_OINTMENT | OPHTHALMIC | Status: DC | PRN
  Administered 2013-08-07: 1 via OPHTHALMIC

## 2013-08-07 MED ORDER — ACETAMINOPHEN 500 MG PO TABS
1000.0000 mg | ORAL_TABLET | Freq: Four times a day (QID) | ORAL | Status: AC
  Administered 2013-08-07 – 2013-08-08 (×3): 1000 mg via ORAL
  Filled 2013-08-07 (×3): qty 2

## 2013-08-07 MED ORDER — BISACODYL 5 MG PO TBEC
10.0000 mg | DELAYED_RELEASE_TABLET | Freq: Every day | ORAL | Status: DC
  Administered 2013-08-08 – 2013-08-09 (×2): 10 mg via ORAL
  Filled 2013-08-07 (×2): qty 2

## 2013-08-07 MED ORDER — PANTOPRAZOLE SODIUM 40 MG PO TBEC
40.0000 mg | DELAYED_RELEASE_TABLET | Freq: Every day | ORAL | Status: DC
  Administered 2013-08-08 – 2013-08-16 (×9): 40 mg via ORAL
  Filled 2013-08-07 (×8): qty 1

## 2013-08-07 MED ORDER — MIDAZOLAM HCL 2 MG/2ML IJ SOLN
INTRAMUSCULAR | Status: AC
  Filled 2013-08-07: qty 2

## 2013-08-07 MED ORDER — 0.9 % SODIUM CHLORIDE (POUR BTL) OPTIME
TOPICAL | Status: DC | PRN
  Administered 2013-08-07: 2000 mL

## 2013-08-07 MED ORDER — ONDANSETRON HCL 4 MG/2ML IJ SOLN
4.0000 mg | Freq: Four times a day (QID) | INTRAMUSCULAR | Status: DC | PRN
  Filled 2013-08-07: qty 2

## 2013-08-07 MED ORDER — FENTANYL 10 MCG/ML IV SOLN
INTRAVENOUS | Status: DC
  Administered 2013-08-07: 60 ug via INTRAVENOUS
  Administered 2013-08-07: 19:00:00 via INTRAVENOUS
  Administered 2013-08-08: 53.99 ug via INTRAVENOUS
  Administered 2013-08-08: 30 ug via INTRAVENOUS
  Administered 2013-08-08: 90 ug via INTRAVENOUS
  Administered 2013-08-08: 45 ug via INTRAVENOUS
  Administered 2013-08-08: 30 ug via INTRAVENOUS
  Administered 2013-08-08: 120 ug via INTRAVENOUS
  Administered 2013-08-09: 75 ug via INTRAVENOUS
  Administered 2013-08-09: 15 ug via INTRAVENOUS
  Filled 2013-08-07 (×2): qty 50

## 2013-08-07 MED ORDER — DIPHENHYDRAMINE HCL 12.5 MG/5ML PO ELIX
12.5000 mg | ORAL_SOLUTION | Freq: Four times a day (QID) | ORAL | Status: DC | PRN
  Filled 2013-08-07: qty 5

## 2013-08-07 MED ORDER — ACETAMINOPHEN 160 MG/5ML PO SOLN: 1000.0000 mg | Freq: Four times a day (QID) | ORAL | Status: AC

## 2013-08-07 MED ORDER — OXYCODONE-ACETAMINOPHEN 5-325 MG PO TABS
1.0000 | ORAL_TABLET | ORAL | Status: DC | PRN
  Administered 2013-08-08 – 2013-08-09 (×2): 1 via ORAL
  Administered 2013-08-10 – 2013-08-16 (×20): 2 via ORAL
  Filled 2013-08-07 (×3): qty 2
  Filled 2013-08-07: qty 1
  Filled 2013-08-07 (×12): qty 2
  Filled 2013-08-07: qty 1
  Filled 2013-08-07 (×5): qty 2

## 2013-08-07 MED ORDER — LIDOCAINE HCL (CARDIAC) 20 MG/ML IV SOLN
INTRAVENOUS | Status: DC | PRN
  Administered 2013-08-07: 60 mg via INTRAVENOUS

## 2013-08-07 MED ORDER — TRAMADOL HCL 50 MG PO TABS
50.0000 mg | ORAL_TABLET | Freq: Four times a day (QID) | ORAL | Status: DC | PRN
  Administered 2013-08-09 – 2013-08-12 (×3): 100 mg via ORAL
  Filled 2013-08-07 (×3): qty 2

## 2013-08-07 MED ORDER — FENTANYL CITRATE 0.05 MG/ML IJ SOLN
INTRAMUSCULAR | Status: AC
  Filled 2013-08-07: qty 5

## 2013-08-07 MED ORDER — TIOTROPIUM BROMIDE MONOHYDRATE 18 MCG IN CAPS
18.0000 ug | ORAL_CAPSULE | Freq: Every day | RESPIRATORY_TRACT | Status: DC
  Administered 2013-08-08 – 2013-08-16 (×8): 18 ug via RESPIRATORY_TRACT
  Filled 2013-08-07 (×3): qty 5

## 2013-08-07 MED ORDER — POTASSIUM CHLORIDE 10 MEQ/50ML IV SOLN
10.0000 meq | Freq: Every day | INTRAVENOUS | Status: DC | PRN
  Filled 2013-08-07: qty 50

## 2013-08-07 MED ORDER — KETOROLAC TROMETHAMINE 30 MG/ML IJ SOLN
30.0000 mg | Freq: Four times a day (QID) | INTRAMUSCULAR | Status: AC
  Administered 2013-08-08 – 2013-08-09 (×7): 30 mg via INTRAVENOUS
  Filled 2013-08-07 (×7): qty 1

## 2013-08-07 MED ORDER — LACTATED RINGERS IV SOLN
INTRAVENOUS | Status: DC | PRN
  Administered 2013-08-07 (×2): via INTRAVENOUS

## 2013-08-07 MED ORDER — FENTANYL CITRATE 0.05 MG/ML IJ SOLN
INTRAMUSCULAR | Status: AC
  Filled 2013-08-07: qty 2

## 2013-08-07 MED ORDER — NEOSTIGMINE METHYLSULFATE 1 MG/ML IJ SOLN
INTRAMUSCULAR | Status: DC | PRN
  Administered 2013-08-07: 5 mg via INTRAVENOUS

## 2013-08-07 MED ORDER — BUPIVACAINE ON-Q PAIN PUMP (FOR ORDER SET NO CHG)
INJECTION | Status: AC
  Filled 2013-08-07: qty 1

## 2013-08-07 MED ORDER — NEOSTIGMINE METHYLSULFATE 1 MG/ML IJ SOLN
INTRAMUSCULAR | Status: AC
  Filled 2013-08-07: qty 10

## 2013-08-07 MED ORDER — SODIUM CHLORIDE 0.9 % IJ SOLN: 9.0000 mL | INTRAMUSCULAR | Status: DC | PRN

## 2013-08-07 MED ORDER — LISINOPRIL 5 MG PO TABS
5.0000 mg | ORAL_TABLET | Freq: Two times a day (BID) | ORAL | Status: DC
  Administered 2013-08-08 – 2013-08-12 (×7): 5 mg via ORAL
  Filled 2013-08-07 (×12): qty 1

## 2013-08-07 MED ORDER — BUPROPION HCL ER (XL) 150 MG PO TB24
150.0000 mg | ORAL_TABLET | Freq: Every day | ORAL | Status: DC
  Administered 2013-08-08 – 2013-08-16 (×9): 150 mg via ORAL
  Filled 2013-08-07 (×9): qty 1

## 2013-08-07 MED ORDER — ONDANSETRON HCL 4 MG/2ML IJ SOLN: 4.0000 mg | Freq: Once | INTRAMUSCULAR | Status: DC | PRN

## 2013-08-07 MED ORDER — ONDANSETRON HCL 4 MG/2ML IJ SOLN: 4.0000 mg | Freq: Four times a day (QID) | INTRAMUSCULAR | Status: DC | PRN

## 2013-08-07 MED ORDER — EZETIMIBE 10 MG PO TABS
10.0000 mg | ORAL_TABLET | Freq: Every day | ORAL | Status: DC
  Administered 2013-08-08 – 2013-08-16 (×9): 10 mg via ORAL
  Filled 2013-08-07 (×9): qty 1

## 2013-08-07 MED ORDER — HEMOSTATIC AGENTS (NO CHARGE) OPTIME
TOPICAL | Status: DC | PRN
  Administered 2013-08-07: 1 via TOPICAL

## 2013-08-07 MED ORDER — GLYCOPYRROLATE 0.2 MG/ML IJ SOLN
INTRAMUSCULAR | Status: DC | PRN
  Administered 2013-08-07: .8 mg via INTRAVENOUS

## 2013-08-07 MED ORDER — ONDANSETRON HCL 4 MG/2ML IJ SOLN
INTRAMUSCULAR | Status: DC | PRN
  Administered 2013-08-07: 4 mg via INTRAVENOUS

## 2013-08-07 MED ORDER — BUPIVACAINE 0.5 % ON-Q PUMP SINGLE CATH 400 ML
400.0000 mL | INJECTION | Status: DC
  Filled 2013-08-07: qty 400

## 2013-08-07 MED ORDER — ONDANSETRON HCL 4 MG/2ML IJ SOLN
INTRAMUSCULAR | Status: AC
  Filled 2013-08-07: qty 2

## 2013-08-07 MED ORDER — CARVEDILOL 3.125 MG PO TABS
3.1250 mg | ORAL_TABLET | Freq: Two times a day (BID) | ORAL | Status: DC
  Administered 2013-08-08 – 2013-08-16 (×11): 3.125 mg via ORAL
  Filled 2013-08-07 (×20): qty 1

## 2013-08-07 MED ORDER — LACTATED RINGERS IV SOLN
INTRAVENOUS | Status: DC
  Administered 2013-08-07 (×2): via INTRAVENOUS

## 2013-08-07 MED ORDER — PROPOFOL 10 MG/ML IV BOLUS
INTRAVENOUS | Status: AC
  Filled 2013-08-07: qty 20

## 2013-08-07 MED ORDER — SENNOSIDES-DOCUSATE SODIUM 8.6-50 MG PO TABS
1.0000 | ORAL_TABLET | Freq: Every evening | ORAL | Status: DC | PRN
  Filled 2013-08-07: qty 1

## 2013-08-07 MED ORDER — NALOXONE HCL 0.4 MG/ML IJ SOLN
0.4000 mg | INTRAMUSCULAR | Status: DC | PRN
  Filled 2013-08-07: qty 1

## 2013-08-07 MED ORDER — DEXTROSE 5 % IV SOLN
1.5000 g | Freq: Two times a day (BID) | INTRAVENOUS | Status: AC
  Administered 2013-08-07 – 2013-08-08 (×2): 1.5 g via INTRAVENOUS
  Filled 2013-08-07 (×2): qty 1.5

## 2013-08-07 SURGICAL SUPPLY — 78 items
BENZOIN TINCTURE PRP APPL 2/3 (GAUZE/BANDAGES/DRESSINGS) IMPLANT
CANISTER SUCTION 2500CC (MISCELLANEOUS) ×8 IMPLANT
CATH KIT ON Q 5IN SLV (PAIN MANAGEMENT) IMPLANT
CATH THORACIC 28FR (CATHETERS) IMPLANT
CATH THORACIC 36FR (CATHETERS) IMPLANT
CATH THORACIC 36FR RT ANG (CATHETERS) IMPLANT
CLIP TI MEDIUM 6 (CLIP) ×4 IMPLANT
CONT SPEC 4OZ CLIKSEAL STRL BL (MISCELLANEOUS) ×8 IMPLANT
COVER SURGICAL LIGHT HANDLE (MISCELLANEOUS) ×4 IMPLANT
DERMABOND ADVANCED (GAUZE/BANDAGES/DRESSINGS) ×2
DERMABOND ADVANCED .7 DNX12 (GAUZE/BANDAGES/DRESSINGS) ×2 IMPLANT
DRAIN CHANNEL 28F RND 3/8 FF (WOUND CARE) IMPLANT
DRAIN CHANNEL 32F RND 10.7 FF (WOUND CARE) IMPLANT
DRAPE LAPAROSCOPIC ABDOMINAL (DRAPES) ×4 IMPLANT
DRAPE WARM FLUID 44X44 (DRAPE) ×4 IMPLANT
ELECT REM PT RETURN 9FT ADLT (ELECTROSURGICAL) ×4
ELECTRODE REM PT RTRN 9FT ADLT (ELECTROSURGICAL) ×2 IMPLANT
GLOVE BIOGEL M 6.5 STRL (GLOVE) ×4 IMPLANT
GLOVE BIOGEL PI IND STRL 6.5 (GLOVE) ×4 IMPLANT
GLOVE BIOGEL PI INDICATOR 6.5 (GLOVE) ×4
GLOVE SURG SIGNA 7.5 PF LTX (GLOVE) ×8 IMPLANT
GLOVE SURG SS PI 7.0 STRL IVOR (GLOVE) ×8 IMPLANT
GOWN STRL REUS W/ TWL LRG LVL3 (GOWN DISPOSABLE) ×4 IMPLANT
GOWN STRL REUS W/ TWL XL LVL3 (GOWN DISPOSABLE) ×8 IMPLANT
GOWN STRL REUS W/TWL LRG LVL3 (GOWN DISPOSABLE) ×4
GOWN STRL REUS W/TWL XL LVL3 (GOWN DISPOSABLE) ×8
HANDLE STAPLE ENDO GIA SHORT (STAPLE) ×2
HEMOSTAT SURGICEL 2X14 (HEMOSTASIS) ×4 IMPLANT
KIT BASIN OR (CUSTOM PROCEDURE TRAY) ×4 IMPLANT
KIT ROOM TURNOVER OR (KITS) ×4 IMPLANT
KIT SUCTION CATH 14FR (SUCTIONS) ×4 IMPLANT
NS IRRIG 1000ML POUR BTL (IV SOLUTION) ×8 IMPLANT
PACK CHEST (CUSTOM PROCEDURE TRAY) ×4 IMPLANT
PAD ARMBOARD 7.5X6 YLW CONV (MISCELLANEOUS) ×8 IMPLANT
PAD DEFIB R2 (MISCELLANEOUS) ×4 IMPLANT
POUCH ENDO CATCH II 15MM (MISCELLANEOUS) IMPLANT
POUCH SPECIMEN RETRIEVAL 10MM (ENDOMECHANICALS) ×4 IMPLANT
RELOAD EGIA 45 MED/THCK PURPLE (STAPLE) ×4 IMPLANT
RELOAD EGIA 60 MED/THCK PURPLE (STAPLE) ×16 IMPLANT
SCISSORS ENDO CVD 5DCS (MISCELLANEOUS) ×4 IMPLANT
SEALANT PROGEL (MISCELLANEOUS) IMPLANT
SEALANT SURG COSEAL 4ML (VASCULAR PRODUCTS) IMPLANT
SEALANT SURG COSEAL 8ML (VASCULAR PRODUCTS) IMPLANT
SOLUTION ANTI FOG 6CC (MISCELLANEOUS) ×4 IMPLANT
SPECIMEN JAR MEDIUM (MISCELLANEOUS) ×12 IMPLANT
SPONGE GAUZE 4X4 12PLY (GAUZE/BANDAGES/DRESSINGS) ×4 IMPLANT
SPONGE INTESTINAL PEANUT (DISPOSABLE) ×4 IMPLANT
STAPLER ENDO GIA 12MM SHORT (STAPLE) ×2 IMPLANT
SUT PROLENE 4 0 RB 1 (SUTURE)
SUT PROLENE 4-0 RB1 .5 CRCL 36 (SUTURE) IMPLANT
SUT SILK  1 MH (SUTURE) ×4
SUT SILK 1 MH (SUTURE) ×4 IMPLANT
SUT SILK 2 0SH CR/8 30 (SUTURE) IMPLANT
SUT SILK 3 0SH CR/8 30 (SUTURE) ×4 IMPLANT
SUT VIC AB 0 CTX 27 (SUTURE) IMPLANT
SUT VIC AB 1 CTX 27 (SUTURE) IMPLANT
SUT VIC AB 2-0 CT1 27 (SUTURE) ×2
SUT VIC AB 2-0 CT1 TAPERPNT 27 (SUTURE) ×2 IMPLANT
SUT VIC AB 2-0 CTX 36 (SUTURE) IMPLANT
SUT VIC AB 3-0 MH 27 (SUTURE) IMPLANT
SUT VIC AB 3-0 SH 27 (SUTURE)
SUT VIC AB 3-0 SH 27X BRD (SUTURE) IMPLANT
SUT VIC AB 3-0 X1 27 (SUTURE) ×8 IMPLANT
SUT VICRYL 0 UR6 27IN ABS (SUTURE) ×4 IMPLANT
SUT VICRYL 2 TP 1 (SUTURE) IMPLANT
SWAB COLLECTION DEVICE MRSA (MISCELLANEOUS) IMPLANT
SYSTEM SAHARA CHEST DRAIN ATS (WOUND CARE) ×4 IMPLANT
TAPE CLOTH SURG 4X10 WHT LF (GAUZE/BANDAGES/DRESSINGS) ×4 IMPLANT
TIP APPLICATOR SPRAY EXTEND 16 (VASCULAR PRODUCTS) IMPLANT
TOWEL OR 17X24 6PK STRL BLUE (TOWEL DISPOSABLE) ×4 IMPLANT
TOWEL OR 17X26 10 PK STRL BLUE (TOWEL DISPOSABLE) ×8 IMPLANT
TRAP SPECIMEN MUCOUS 40CC (MISCELLANEOUS) IMPLANT
TRAY FOLEY CATH 16FRSI W/METER (SET/KITS/TRAYS/PACK) ×4 IMPLANT
TROCAR XCEL BLADELESS 5X75MML (TROCAR) ×4 IMPLANT
TROCAR XCEL NON-BLD 5MMX100MML (ENDOMECHANICALS) IMPLANT
TUBE ANAEROBIC SPECIMEN COL (MISCELLANEOUS) IMPLANT
TUNNELER SHEATH ON-Q 11GX8 DSP (PAIN MANAGEMENT) IMPLANT
WATER STERILE IRR 1000ML POUR (IV SOLUTION) ×8 IMPLANT

## 2013-08-07 NOTE — Anesthesia Procedure Notes (Signed)
Procedure Name: Intubation Performed by: Ollen Bowl Pre-anesthesia Checklist: Patient identified, Emergency Drugs available, Suction available, Patient being monitored and Timeout performed Patient Re-evaluated:Patient Re-evaluated prior to inductionOxygen Delivery Method: Circle system utilized Preoxygenation: Pre-oxygenation with 100% oxygen Intubation Type: IV induction Ventilation: Mask ventilation without difficulty Laryngoscope Size: Mac and 3 Grade View: Grade I Endobronchial tube: Left and Double lumen EBT and 39 Fr Number of attempts: 1 Airway Equipment and Method: Fiberoptic brochoscope Placement Confirmation: ETT inserted through vocal cords under direct vision,  positive ETCO2 and breath sounds checked- equal and bilateral Tube secured with: Tape Dental Injury: Teeth and Oropharynx as per pre-operative assessment

## 2013-08-07 NOTE — Interval H&P Note (Signed)
History and Physical Interval Note:  08/07/2013 11:25 AM  [name]  has presented today for surgery, with the diagnosis of ADENOCARCINIOMA - RLL  The various methods of treatment have been discussed with the patient and family. After consideration of risks, benefits and other options for treatment, the patient has consented to  Procedure(s): VIDEO ASSISTED THORACOSCOPY (VATS)/WEDGE RESECTION (Right) LYMPH NODE DISSECTION (Right) as a surgical intervention .  The patient's history has been reviewed, patient examined, no change in status, stable for surgery.  I have reviewed the patient's chart and labs.  Questions were answered to the patient's satisfaction.     HENDRICKSON,STEVEN C

## 2013-08-07 NOTE — Transfer of Care (Signed)
Immediate Anesthesia Transfer of Care Note  Patient: [name]  Procedure(s) Performed: Procedure(s): VIDEO ASSISTED THORACOSCOPY (VATS)/WEDGE RESECTION (Right) LYMPH NODE DISSECTION (Right)  Patient Location: PACU  Anesthesia Type:General  Level of Consciousness: awake and alert   Airway & Oxygen Therapy: Patient Spontanous Breathing and Patient connected to face mask oxygen  Post-op Assessment: Report given to PACU RN and Post -op Vital signs reviewed and stable  Post vital signs: Reviewed and stable  Complications: No apparent anesthesia complications

## 2013-08-07 NOTE — Anesthesia Preprocedure Evaluation (Signed)
Anesthesia Evaluation  Patient identified by MRN, date of birth, ID band Patient awake    Reviewed: Allergy & Precautions, H&P , NPO status , Patient's Chart, lab work & pertinent test results, reviewed documented beta blocker date and time   History of Anesthesia Complications Negative for: history of anesthetic complications  Airway Mallampati: I TM Distance: >3 FB Neck ROM: Full    Dental  (+) Teeth Intact   Pulmonary shortness of breath and with exertion, COPD COPD inhaler, former smoker,  breath sounds clear to auscultation        Cardiovascular Exercise Tolerance: Poor hypertension, Pt. on medications + CAD, + Past MI, + Cardiac Stents, +CHF and + DOE + Cardiac Defibrillator - Valvular Problems/MurmursRhythm:Regular Rate:Normal     Neuro/Psych  Headaches, Depression    GI/Hepatic negative GI ROS, Neg liver ROS,   Endo/Other  negative endocrine ROS  Renal/GU negative Renal ROS     Musculoskeletal   Abdominal   Peds  Hematology  (+) anemia ,   Anesthesia Other Findings   Reproductive/Obstetrics                           Anesthesia Physical Anesthesia Plan  ASA: III  Anesthesia Plan: General   Post-op Pain Management:    Induction: Intravenous  Airway Management Planned: Double Lumen EBT  Additional Equipment: Arterial line and CVP  Intra-op Plan:   Post-operative Plan: Extubation in OR  Informed Consent: I have reviewed the patients History and Physical, chart, labs and discussed the procedure including the risks, benefits and alternatives for the proposed anesthesia with the patient or authorized representative who has indicated his/her understanding and acceptance.   Dental advisory given  Plan Discussed with: CRNA and Surgeon  Anesthesia Plan Comments:         Anesthesia Quick Evaluation

## 2013-08-07 NOTE — Brief Op Note (Addendum)
08/07/2013  4:57 PM  PATIENT:  [name]  53 y.o. male  PRE-OPERATIVE DIAGNOSIS:  ADENOCARCINIOMA of the RLL, Clinical Stage IA  POST-OPERATIVE DIAGNOSIS:  ADENOCARCINIOMA of the RLL, Clinical Stage IA  PROCEDURE:  RIGHT VIDEO ASSISTED THORACOSCOPY (VATS),   RLL WEDGE RESECTION  MEDIASTINAL LYMPH NODE DISSECTION  PLEURAL BIOPSIES  APICAL BLEBECTOMY  ON Q PLACEMENT CATHETER PLACEMENT  SURGEON:  Surgeon(s) and Role:    * Melrose Nakayama, MD - Primary  PHYSICIAN ASSISTANT: Lars Pinks PA-C  ANESTHESIA:   general  EBL:  Total I/O In: 1100 [I.V.:1100] Out: 790 [Urine:295; Blood:200]  BLOOD ADMINISTERED:none  DRAINS: One 27 French straight and one 36 right angle Chest Tube(s) in the right pleural space   LOCAL MEDICATIONS USED:  MARCAINE     SPECIMEN:  Source of Specimen:  RLL wedge, RUL blebectomy, pleural biopsies, and lymph nodes  DISPOSITION OF SPECIMEN:  PATHOLOGY. Frozen section of RLL wedge positive for NSCC.  COUNTS CORRECT :  YES   PLAN OF CARE: Admit to inpatient   PATIENT DISPOSITION:  PACU - hemodynamically stable.   Delay start of Pharmacological VTE agent (>24hrs) due to surgical blood loss or risk of bleeding: yes  Findings: ~1 cm mass posterior RLL, frozen- adenocarcinoma, margins clear Two large apical blebs
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
## 2013-08-07 NOTE — Preoperative (Signed)
Beta Blockers   Reason not to administer Beta Blockers:coreg 3-11

## 2013-08-08 ENCOUNTER — Encounter (HOSPITAL_COMMUNITY): Payer: Self-pay | Admitting: *Deleted

## 2013-08-08 ENCOUNTER — Encounter: Payer: Self-pay | Admitting: *Deleted

## 2013-08-08 ENCOUNTER — Inpatient Hospital Stay (HOSPITAL_COMMUNITY): Payer: Medicaid Other

## 2013-08-08 LAB — POCT I-STAT 3, ART BLOOD GAS (G3+)
ACID-BASE EXCESS: 1 mmol/L (ref 0.0–2.0)
Bicarbonate: 26.8 mEq/L — ABNORMAL HIGH (ref 20.0–24.0)
O2 SAT: 93 %
PO2 ART: 68 mmHg — AB (ref 80.0–100.0)
Patient temperature: 98.4
TCO2: 28 mmol/L (ref 0–100)
pCO2 arterial: 44 mmHg (ref 35.0–45.0)
pH, Arterial: 7.392 (ref 7.350–7.450)

## 2013-08-08 LAB — BASIC METABOLIC PANEL
BUN: 9 mg/dL (ref 6–23)
CO2: 25 meq/L (ref 19–32)
Calcium: 8.4 mg/dL (ref 8.4–10.5)
Chloride: 106 mEq/L (ref 96–112)
Creatinine, Ser: 0.99 mg/dL (ref 0.50–1.35)
GFR calc Af Amer: >90 mL/min (ref >90)
GFR calc non Af Amer: >90 mL/min (ref >90)
Glucose, Bld: 72 mg/dL (ref 70–99)
Potassium: 4.3 mEq/L (ref 3.7–5.3)
SODIUM: 141 meq/L (ref 137–147)

## 2013-08-08 LAB — CBC
HCT: 36.5 % — ABNORMAL LOW (ref 39.0–52.0)
Hemoglobin: 12.8 g/dL — ABNORMAL LOW (ref 13.0–17.0)
MCH: 29 pg (ref 26.0–34.0)
MCHC: 35.1 g/dL (ref 30.0–36.0)
MCV: 82.6 fL (ref 78.0–100.0)
Platelets: 232 10*3/uL (ref 150–400)
RBC: 4.42 MIL/uL (ref 4.22–5.81)
RDW: 14.9 % (ref 11.5–15.5)
WBC: 11.5 10*3/uL — AB (ref 4.0–10.5)

## 2013-08-08 LAB — GLUCOSE, CAPILLARY: Glucose-Capillary: 110 mg/dL — ABNORMAL HIGH (ref 70–99)

## 2013-08-08 MED ORDER — ALBUTEROL SULFATE (2.5 MG/3ML) 0.083% IN NEBU: 2.5000 mg | INHALATION_SOLUTION | Freq: Four times a day (QID) | RESPIRATORY_TRACT | Status: DC | PRN

## 2013-08-08 MED ORDER — ENOXAPARIN SODIUM 40 MG/0.4ML ~~LOC~~ SOLN
40.0000 mg | SUBCUTANEOUS | Status: DC
  Administered 2013-08-08 – 2013-08-15 (×8): 40 mg via SUBCUTANEOUS
  Filled 2013-08-08 (×9): qty 0.4

## 2013-08-08 MED ORDER — PNEUMOCOCCAL VAC POLYVALENT 25 MCG/0.5ML IJ INJ: 0.5000 mL | INJECTION | INTRAMUSCULAR | Status: DC | PRN

## 2013-08-08 NOTE — Progress Notes (Signed)
Patient ID: [name], male   DOB: [birth year], 53 y.o.   MRN: [id number]  SICU Evening Rounds:  Hemodynamically stable, sats 100%  Urine output good  CT output moderate. Air leak present.  Stable day.

## 2013-08-08 NOTE — [facility] Oncology Navigator Note (Unsigned)
Called [phone] to see how patient was recovering from surgery.  Anderson Malta, RN stated he was doing well and waiting to be transferred to step-down unit.

## 2013-08-08 NOTE — Progress Notes (Signed)
Utilization Review Completed.  
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
## 2013-08-08 NOTE — Anesthesia Postprocedure Evaluation (Signed)
  Anesthesia Post-op Note  Patient: [name]  Procedure(s) Performed: Procedure(s): VIDEO ASSISTED THORACOSCOPY (VATS)/WEDGE RESECTION (Right) LYMPH NODE DISSECTION (Right)  Patient Location: PACU  Anesthesia Type:General  Level of Consciousness: awake, alert  and oriented  Airway and Oxygen Therapy: Patient Spontanous Breathing and Patient connected to nasal cannula oxygen  Post-op Pain: mild  Post-op Assessment: Post-op Vital signs reviewed, Patient's Cardiovascular Status Stable, Respiratory Function Stable, Patent Airway, No signs of Nausea or vomiting, Adequate PO intake and Pain level controlled  Post-op Vital Signs: Reviewed and stable  Complications: No apparent anesthesia complications

## 2013-08-08 NOTE — Progress Notes (Signed)
1 Day Post-Op Procedure(s) (LRB): VIDEO ASSISTED THORACOSCOPY (VATS)/WEDGE RESECTION (Right) LYMPH NODE DISSECTION (Right) Subjective: No complaints this morning  Objective: Vital signs in last 24 hours: Temp:  [97.2 F (36.2 C)-99.1 F (37.3 C)] 99.1 F (37.3 C) (03/12 0732) Pulse Rate:  [58-86] 62 (03/12 0800) Cardiac Rhythm:  [-] Normal sinus rhythm (03/11 2000) Resp:  [9-41] 20 (03/12 0800) BP: (97-125)/(60-98) 121/68 mmHg (03/12 0800) SpO2:  [94 %-100 %] 100 % (03/12 0800) Arterial Line BP: (103-140)/(48-85) 126/54 mmHg (03/12 0800) Weight:  [130 lb (58.968 kg)-130 lb 4.7 oz (59.1 kg)] 130 lb 4.7 oz (59.1 kg) (03/12 0600)  Hemodynamic parameters for last 24 hours:    Intake/Output from previous day: 03/11 0701 - 03/12 0700 In: 3376.7 [I.V.:3326.7; IV Piggyback:50] Out: 7092 [Urine:820; Blood:200; Chest Tube:465] Intake/Output this shift: Total I/O In: 128 [I.V.:78; IV Piggyback:50] Out: 110 [Urine:50; Chest Tube:60]  General appearance: alert and no distress Neurologic: intact Heart: regular rate and rhythm Lungs: wheezes mild bilateral Abdomen: normal findings: soft, non-tender + air leak  Lab Results:  Recent Labs  08/06/13 1436 08/08/13 0410  WBC 8.6 11.5*  HGB 15.8 12.8*  HCT 43.5 36.5*  PLT 302 232   BMET:  Recent Labs  08/06/13 1436 08/08/13 0410  NA 137 141  K 5.5* 4.3  CL 103 106  CO2 22 25  GLUCOSE 102* 72  BUN 12 9  CREATININE 0.88 0.99  CALCIUM 9.6 8.4    PT/INR:  Recent Labs  08/06/13 1436  LABPROT 13.4  INR 1.04   ABG    Component Value Date/Time   PHART 7.392 08/08/2013 0421   HCO3 26.8* 08/08/2013 0421   TCO2 28 08/08/2013 0421   O2SAT 93.0 08/08/2013 0421   CBG (last 3)   Recent Labs  08/07/13 2124 08/07/13 2343 08/08/13 0608  GLUCAP 118* 141* 110*    Assessment/Plan: S/P Procedure(s) (LRB): VIDEO ASSISTED THORACOSCOPY (VATS)/WEDGE RESECTION (Right) LYMPH NODE DISSECTION (Right) Plan for transfer to  step-down: see transfer orders  POD # 1 RLL wedge resection + air leak not surprising given degree of adhesions, keep CT to suction for now Advance diet IV to KVO SCD + enoxaparin for DVT prophylaxis Ambulate DC a line and foley   LOS: 1 day    Caya Soberanis C 08/08/2013

## 2013-08-08 NOTE — Progress Notes (Signed)
Fentanyl PCA syringe changed, 21mL waste in sink with syringe change, Marcelyn Ditty RN witness waste. Reather Laurence

## 2013-08-09 ENCOUNTER — Inpatient Hospital Stay (HOSPITAL_COMMUNITY): Payer: Medicaid Other

## 2013-08-09 LAB — CBC
HEMATOCRIT: 33.4 % — AB (ref 39.0–52.0)
HEMOGLOBIN: 11.5 g/dL — AB (ref 13.0–17.0)
MCH: 28.7 pg (ref 26.0–34.0)
MCHC: 34.4 g/dL (ref 30.0–36.0)
MCV: 83.3 fL (ref 78.0–100.0)
Platelets: 195 10*3/uL (ref 150–400)
RBC: 4.01 MIL/uL — AB (ref 4.22–5.81)
RDW: 14.5 % (ref 11.5–15.5)
WBC: 8.5 10*3/uL (ref 4.0–10.5)

## 2013-08-09 LAB — COMPREHENSIVE METABOLIC PANEL
ALK PHOS: 54 U/L (ref 39–117)
ALT: 13 U/L (ref 0–53)
AST: 22 U/L (ref 0–37)
Albumin: 2.5 g/dL — ABNORMAL LOW (ref 3.5–5.2)
BUN: 8 mg/dL (ref 6–23)
CHLORIDE: 102 meq/L (ref 96–112)
CO2: 29 meq/L (ref 19–32)
Calcium: 8.5 mg/dL (ref 8.4–10.5)
Creatinine, Ser: 0.92 mg/dL (ref 0.50–1.35)
GLUCOSE: 128 mg/dL — AB (ref 70–99)
POTASSIUM: 4.3 meq/L (ref 3.7–5.3)
Sodium: 139 mEq/L (ref 137–147)
Total Bilirubin: 0.4 mg/dL (ref 0.3–1.2)
Total Protein: 5.7 g/dL — ABNORMAL LOW (ref 6.0–8.3)

## 2013-08-09 MED ORDER — SODIUM CHLORIDE 0.9 % IJ SOLN
10.0000 mL | Freq: Two times a day (BID) | INTRAMUSCULAR | Status: DC
  Administered 2013-08-09: 20 mL via INTRAVENOUS
  Administered 2013-08-10 – 2013-08-12 (×2): 10 mL via INTRAVENOUS

## 2013-08-09 NOTE — Progress Notes (Signed)
Patient examined and record reviewed.Hemodynamics stable,labs satisfactory.Patient had stable day.Continue current care. VAN TRIGT III,Donn Zanetti 08/09/2013

## 2013-08-09 NOTE — Progress Notes (Addendum)
3ml fentanyl PCA medication wasted per protocol.  Witnessed Revonda Humphrey.  This RN witnessed waste. Archie Endo, RN

## 2013-08-09 NOTE — Progress Notes (Signed)
2 Days Post-Op Procedure(s) (LRB): VIDEO ASSISTED THORACOSCOPY (VATS)/WEDGE RESECTION (Right) LYMPH NODE DISSECTION (Right) Subjective: No complaints  Objective: Vital signs in last 24 hours: Temp:  [97.6 F (36.4 C)-98.1 F (36.7 C)] 97.9 F (36.6 C) (03/13 0718) Pulse Rate:  [63-94] 65 (03/13 0700) Cardiac Rhythm:  [-] Normal sinus rhythm (03/13 0700) Resp:  [9-40] 15 (03/13 0700) BP: (90-130)/(52-103) 114/66 mmHg (03/13 0700) SpO2:  [89 %-100 %] 98 % (03/13 0700) Arterial Line BP: (110-129)/(43-60) 110/43 mmHg (03/12 1000)  Hemodynamic parameters for last 24 hours:    Intake/Output from previous day: 03/12 0701 - 03/13 0700 In: 1494.4 [P.O.:880; I.V.:564.4; IV Piggyback:50] Out: 1685 [Urine:1115; Chest Tube:570] Intake/Output this shift:    General appearance: alert and no distress Neurologic: intact Heart: regular rate and rhythm Lungs: diminished breath sounds left apex Abdomen: normal findings: soft, non-tender samll air leak decreased- serosanguinous drainage from tube  Lab Results:  Recent Labs  08/08/13 0410 08/09/13 0345  WBC 11.5* 8.5  HGB 12.8* 11.5*  HCT 36.5* 33.4*  PLT 232 195   BMET:  Recent Labs  08/08/13 0410 08/09/13 0345  NA 141 139  K 4.3 4.3  CL 106 102  CO2 25 29  GLUCOSE 72 128*  BUN 9 8  CREATININE 0.99 0.92  CALCIUM 8.4 8.5    PT/INR:  Recent Labs  08/06/13 1436  LABPROT 13.4  INR 1.04   ABG    Component Value Date/Time   PHART 7.392 08/08/2013 0421   HCO3 26.8* 08/08/2013 0421   TCO2 28 08/08/2013 0421   O2SAT 93.0 08/08/2013 0421   CBG (last 3)   Recent Labs  08/07/13 2124 08/07/13 2343 08/08/13 0608  GLUCAP 118* 141* 110*    Assessment/Plan: S/P Procedure(s) (LRB): VIDEO ASSISTED THORACOSCOPY (VATS)/WEDGE RESECTION (Right) LYMPH NODE DISSECTION (Right) Plan for transfer to step-down: see transfer orders Awaiting bed on 3300 Air leak is decreased- will put on water seal  Keep both CT today- drained  over 500 ml yesterday Not using PCA- dc, use oral pain meds Enoxaparin + SCD Ambulate Anemia secondary to ABL- mild, follow   LOS: 2 days    HENDRICKSON,STEVEN C 08/09/2013

## 2013-08-10 ENCOUNTER — Inpatient Hospital Stay (HOSPITAL_COMMUNITY): Payer: Medicaid Other

## 2013-08-10 LAB — GLUCOSE, CAPILLARY: GLUCOSE-CAPILLARY: 145 mg/dL — AB (ref 70–99)

## 2013-08-10 NOTE — Progress Notes (Signed)
Patient transferred to 2W33 via wheelchair.  At time of transfer patient was alert, oriented and VSS.  Patient given pain medicine at 1313 and patient comfortable at time of transfer.  All patient belongings, patient chart and meds moved with patient and patient left in the care of Jeneen Rinks, [place].   Bobette Mo

## 2013-08-10 NOTE — Progress Notes (Signed)
3 Days Post-Op Procedure(s) (LRB): VIDEO ASSISTED THORACOSCOPY (VATS)/WEDGE RESECTION (Right) LYMPH NODE DISSECTION (Right) Subjective: Minimal complaints patient walking in the hallway breathing comfortably Fairly significant airleak with mild cough and chest x-ray today shows apical space on right Will reinstitute suction 2 chest tubes 10 cm H2O, leave both tubes in Patient will be transferred to unit 2 W. Objective: Vital signs in last 24 hours: Temp:  [97.5 F (36.4 C)-98.4 F (36.9 C)] 97.9 F (36.6 C) (03/14 0723) Pulse Rate:  [69-113] 83 (03/14 0815) Cardiac Rhythm:  [-] Normal sinus rhythm (03/14 0815) Resp:  [14-34] 22 (03/14 0815) BP: (97-115)/(54-70) 106/67 mmHg (03/14 0959) SpO2:  [82 %-99 %] 93 % (03/14 0900) Weight:  [130 lb 1.1 oz (59 kg)] 130 lb 1.1 oz (59 kg) (03/14 0600)  Hemodynamic parameters for last 24 hours:   afebrile normal sinus rhythm  Intake/Output from previous day: 03/13 0701 - 03/14 0700 In: 840 [P.O.:720; I.V.:120] Out: 660 [Urine:450; Chest Tube:210] Intake/Output this shift: Total I/O In: 120 [P.O.:120] Out: -   Lungs slightly diminished on the right 3 column airleak with cough  Lab Results:  Recent Labs  08/08/13 0410 08/09/13 0345  WBC 11.5* 8.5  HGB 12.8* 11.5*  HCT 36.5* 33.4*  PLT 232 195   BMET:  Recent Labs  08/08/13 0410 08/09/13 0345  NA 141 139  K 4.3 4.3  CL 106 102  CO2 25 29  GLUCOSE 72 128*  BUN 9 8  CREATININE 0.99 0.92  CALCIUM 8.4 8.5    PT/INR: No results found for this basename: LABPROT, INR,  in the last 72 hours ABG    Component Value Date/Time   PHART 7.392 08/08/2013 0421   HCO3 26.8* 08/08/2013 0421   TCO2 28 08/08/2013 0421   O2SAT 93.0 08/08/2013 0421   CBG (last 3)   Recent Labs  08/07/13 2343 08/08/13 0608 08/10/13 0720  GLUCAP 141* 110* 145*    Assessment/Plan: S/P Procedure(s) (LRB): VIDEO ASSISTED THORACOSCOPY (VATS)/WEDGE RESECTION (Right) LYMPH NODE DISSECTION  (Right) We'll transfer to unit to last Reinstitute low-level suction due to accumulation of right apical pneumo   LOS: 3 days    VAN TRIGT III,[name] 08/10/2013

## 2013-08-11 ENCOUNTER — Inpatient Hospital Stay (HOSPITAL_COMMUNITY): Payer: Medicaid Other

## 2013-08-11 NOTE — Progress Notes (Addendum)
[address]       , [id number]             [phone]      4 Days Post-Op  Procedure(s) (LRB): VIDEO ASSISTED THORACOSCOPY (VATS)/WEDGE RESECTION (Right) LYMPH NODE DISSECTION (Right) Subjective: Overall he is feeling pretty well  Objective  Telemetry sinus rhythm  Temp:  [97.8 F (36.6 C)-98.1 F (36.7 C)] 97.9 F (36.6 C) (03/15 0517) Pulse Rate:  [72-76] 72 (03/15 0517) Resp:  [18-20] 18 (03/15 0517) BP: (104-116)/(63-70) 116/70 mmHg (03/15 0517) SpO2:  [98 %-100 %] 98 % (03/15 0517)   Intake/Output Summary (Last 24 hours) at 08/11/13 1021 Last data filed at 08/11/13 0700  Gross per 24 hour  Intake    120 ml  Output   1290 ml  Net  -1170 ml       General appearance: alert, cooperative and no distress Heart: regular rate and rhythm Lungs: dim in basees Abdomen: benign Extremities: no edema Wound: dressings CDI  Lab Results:  Recent Labs  08/09/13 0345  NA 139  K 4.3  CL 102  CO2 29  GLUCOSE 128*  BUN 8  CREATININE 0.92  CALCIUM 8.5    Recent Labs  08/09/13 0345  AST 22  ALT 13  ALKPHOS 54  BILITOT 0.4  PROT 5.7*  ALBUMIN 2.5*   No results found for this basename: LIPASE, AMYLASE,  in the last 72 hours  Recent Labs  08/09/13 0345  WBC 8.5  HGB 11.5*  HCT 33.4*  MCV 83.3  PLT 195   No results found for this basename: CKTOTAL, CKMB, TROPONINI,  in the last 72 hours No components found with this basename: POCBNP,  No results found for this basename: DDIMER,  in the last 72 hours No results found for this basename: HGBA1C,  in the last 72 hours No results found for this basename: CHOL, HDL, LDLCALC, TRIG, CHOLHDL,  in the last 72 hours No results found for this basename: TSH, T4TOTAL, FREET3, T3FREE, THYROIDAB,  in the last 72 hours No results found for this basename: VITAMINB12, FOLATE, FERRITIN, TIBC, IRON, RETICCTPCT,  in the last 72 hours  Medications: Scheduled . aspirin EC  81 mg Oral Daily    . bisacodyl  10 mg Oral Daily  . buPROPion  150 mg Oral Daily  . carvedilol  3.125 mg Oral BID WC  . enoxaparin (LOVENOX) injection  40 mg Subcutaneous Q24H  . ezetimibe  10 mg Oral Daily  . lisinopril  5 mg Oral BID  . pantoprazole  40 mg Oral Daily  . simvastatin  40 mg Oral QHS  . sodium chloride  10-20 mL Intravenous Q12H  . tiotropium  18 mcg Inhalation Daily     Radiology/Studies:  Dg Chest Port 1 View  08/10/2013   CLINICAL DATA:  Status post wedge resection  EXAM: PORTABLE CHEST - 1 VIEW  COMPARISON:  08/09/2013  FINDINGS: Two right-sided chest tubes are in similar position when accounting for differences in positioning. A right-sided pneumothorax is larger, estimated 10 to 20%. Opacity in the peripheral right lung has increased, likely atelectasis given the timing. Unchanged right IJ catheter and left subclavian pacer line. No cardiomegaly. Small volume, scarred left lung.  IMPRESSION: Larger right pneumothorax, 10-20%. Two right apical chest tubes are unchanged.   Electronically Signed   By: Jorje Guild M.D.   On: 08/10/2013 07:03   Chest tube :+ 1-2/7 air leak  INR: Will add last result for INR, ABG once components are confirmed Will add last 4 CBG results once components are confirmed  Assessment/Plan: S/P Procedure(s) (LRB): VIDEO ASSISTED THORACOSCOPY (VATS)/WEDGE RESECTION (Right) LYMPH NODE DISSECTION (Right) 1 conts to progress  2 keep chest tube- chest xray for today just getting done 3 cont pulm toilet/rehab 4 no new labs   LOS: 4 days    GOLD,WAYNE E 3/15/201510:21 AM  Patient seen and examined, agree with above Chest x ray shows a right pneumo with apical and basilar components despite CT being in good position. Unclear why taht is the case- may have been laying on the tube overnight. Will keep to suction for now  PATH- T1a,N0- Stage IA d/w patient

## 2013-08-12 ENCOUNTER — Encounter: Payer: Self-pay | Admitting: *Deleted

## 2013-08-12 ENCOUNTER — Inpatient Hospital Stay (HOSPITAL_COMMUNITY): Payer: Medicaid Other

## 2013-08-12 NOTE — [facility] Oncology Navigator Note (Signed)
I called patient to f/u [name].  Patient remains in the hospital following surgery.  He reports that he had one chest tube removed but continues to have a chest tube in place.  He reports that surgery went well.  His pain is being managed well.  He denies any questions at this time.  He verified that he has my contact information.  I encouraged him to call me for any questions or concerns.

## 2013-08-12 NOTE — Progress Notes (Signed)
      [address]       [place] [address]             [phone]       5 Days Post-Op Procedure(s) (LRB): VIDEO ASSISTED THORACOSCOPY (VATS)/WEDGE RESECTION (Right) LYMPH NODE DISSECTION (Right)  Subjective: Patient just finished lunch. No specific complaints.  Objective: Vital signs in last 24 hours: Temp:  [97.5 F (36.4 C)-98.6 F (37 C)] 97.5 F (36.4 C) (03/16 0434) Pulse Rate:  [75-86] 75 (03/16 0434) Cardiac Rhythm:  [-] Normal sinus rhythm (03/15 1940) Resp:  [19] 19 (03/16 0434) BP: (90-103)/(54-62) 103/56 mmHg (03/16 0434) SpO2:  [90 %-97 %] 97 % (03/16 0922)    Intake/Output from previous day: 03/15 0701 - 03/16 0700 In: -  Out: 470 [Urine:400; Chest Tube:70]   Physical Exam:  Cardiovascular: RRR Pulmonary: Clear to auscultation bilaterally; no rales, wheezes, or rhonchi. Abdomen: Soft, non tender, bowel sounds present. Extremities: No lower extremity edema. Wounds: Clean and dry.  No erythema or signs of infection. Chest Tube: to suction, small air leak  Lab Results: CBC:No results found for this basename: WBC, HGB, HCT, PLT,  in the last 72 hours BMET: No results found for this basename: NA, K, CL, CO2, GLUCOSE, BUN, CREATININE, CALCIUM,  in the last 72 hours  PT/INR: No results found for this basename: LABPROT, INR,  in the last 72 hours ABG:  INR: Will add last result for INR, ABG once components are confirmed Will add last 4 CBG results once components are confirmed  Assessment/Plan:  1. CV - History of CAD.SR. On Lisinopril 5 bid and Coreg 3.125 bid. Monitor SBP as may need to decrease Lisinopril. 2.  Pulmonary - Chest tube with 195 cc of output. There is a small air leak.CXR shows right pneumothorax (probably increased). Chest tube to remain to suction for now. Check CXR in am. Dr. Roxan Hockey discussed pathology with patient.   Tylisa Alcivar MPA-C 08/12/2013,12:47 PM

## 2013-08-13 ENCOUNTER — Inpatient Hospital Stay (HOSPITAL_COMMUNITY): Payer: Medicaid Other

## 2013-08-13 MED ORDER — LISINOPRIL 5 MG PO TABS
5.0000 mg | ORAL_TABLET | Freq: Every day | ORAL | Status: DC
  Administered 2013-08-13 – 2013-08-15 (×3): 5 mg via ORAL
  Filled 2013-08-13 (×4): qty 1
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
## 2013-08-13 NOTE — Discharge Instructions (Signed)
Video-Assisted Thoracic Surgery Video-assisted thoracic surgery (VATS) is a procedure that allows your caregiver to look inside your chest and do some minor operations. VATS is commonly done to:  Study or diagnose problems in the chest.  Take a tissue sample (biopsy).   Put medications directly into the chest.   Remove collections of fluid, pus, or blood.  Remove tumors. VATS is done using thoracoscopy. Thoracoscopy is a procedure in which a thin, lighted tube (thoracoscope) is put through a small surgical cut (incision)inyour chest wall. The thoracoscope used for VATS has a video camera on it. It allows your caregiver to see the inside of your chest on a television screen. LET YOUR CAREGIVER KNOW ABOUT:   Any allergies you have.  All medications you are taking, including vitamins, herbs, eyedrops, and over-the-counter medications and creams.  Use of steroids (by mouth or creams).   Previous problems you or members of your family have had with the use of anesthetics.  Any blood disorders you have had.  Any blood thinners, like warfarin, that you take.  Previous surgery.   Any history of heart problems.  Other health problems you RISKS AND COMPLICATIONS  Severe bleeding (hemorrhage).  Injury to nerves or other structures in the chest.   Lung infection.  The chest may need to be opened with a large incision (thoracotomy) if the procedure cannot be completed safely with the thoracoscope. BEFORE THE PROCEDURE   Tests such as blood tests, urine tests, chest X-rays, and electrocardiography may be done.  Follow your caregiver's instructions if you are taking dietary supplements or medications. Your caregiver may tell you to stop taking them or to reduce your dosage.  Do not take new dietary supplements or medications within 1 week of your procedure unless your caregiver approves them.  Do not eat or drink within 8 hours of your procedure or as directed by your  caregiver.  Do not smoke for as long as possible before your procedure. If possible, stop smoking 3 6 weeks before the procedure. PROCEDURE   You will be given a medication that makes you sleep (general anesthetic) through a mask, through an intravenous (IV) access tube, or through both. This medication will prevent you from being alert and feeling pain during the procedure.Once you are asleep, abreathing tube or mask may be used to help you breathe.  A video thoracoscope will be placed in a very small incision (less than an inch wide), so that your caregiver can see into your chest. The picture from inside the chest will be displayed on a television screen.  Other surgical tools will be inserted through the remaining incisions.  One of your lungs will be deflated as the surgery is performed. Deflating a lung makes it easier for your caregiver to see the area. The lung will be inflated at the end of the procedure.  The tools will be removed when your caregiver has finished performing the examination or procedure.  A chest tube will be inserted through an incision to drain the wound. The remaining incisions will be closed with stitches or staples. AFTER THE PROCEDURE  Chest tubes are watched closely for signs of fluid or air buildup in the lungs. Your caregiver will most likely remove them prior to discharge.  You will need to stay in the hospital for 1 5 days. The average time in the hospital with VATS surgery is usually several days less than with standard open surgery.  You may receive medications to help with pain.  It is normal to feel sore for about 2 weeks after the procedure. Document Released: 09/10/2012 Document Reviewed: 09/10/2012 [facility] Patient Information 2014 [place], [place].

## 2013-08-13 NOTE — Progress Notes (Addendum)
      [address]       [place], [address]             [phone]       6 Days Post-Op Procedure(s) (LRB): VIDEO ASSISTED THORACOSCOPY (VATS)/WEDGE RESECTION (Right) LYMPH NODE DISSECTION (Right)  Subjective: Patient without complaints.  Objective: Vital signs in last 24 hours: Temp:  [98 F (36.7 C)-98.4 F (36.9 C)] 98.4 F (36.9 C) (03/17 0543) Pulse Rate:  [70-88] 88 (03/17 0543) Cardiac Rhythm:  [-] Normal sinus rhythm (03/16 1950) Resp:  [18-19] 18 (03/17 0543) BP: (101-108)/(62-67) 101/66 mmHg (03/17 0543) SpO2:  [94 %-98 %] 94 % (03/17 0543)    Intake/Output from previous day: 03/16 0701 - 03/17 0700 In: 130 [P.O.:120; I.V.:10] Out: 68 [Chest Tube:50]   Physical Exam:  Cardiovascular: RRR Pulmonary: Clear to auscultation bilaterally; no rales, wheezes, or rhonchi. Abdomen: Soft, non tender, bowel sounds present. Extremities: No lower extremity edema. Wounds: Clean and dry.  No erythema or signs of infection. Chest Tube: to suction, small air leak  Lab Results: CBC:No results found for this basename: WBC, HGB, HCT, PLT,  in the last 72 hours BMET: No results found for this basename: NA, K, CL, CO2, GLUCOSE, BUN, CREATININE, CALCIUM,  in the last 72 hours  PT/INR: No results found for this basename: LABPROT, INR,  in the last 72 hours ABG:  INR: Will add last result for INR, ABG once components are confirmed Will add last 4 CBG results once components are confirmed  Assessment/Plan:  1. CV - History of CAD.SR. On Lisinopril 5 bid and Coreg 3.125 bid. SBP labile so will decrease Lisinopril to once daily. 2.  Pulmonary - Chest tube with 110 cc of output. There is a small air leak.CXR appears to show decreased right pneumothorax . Chest tube to remain to suction for now. Check CXR in am.  3. Remove central line  ZIMMERMAN,DONIELLE MPA-C 08/13/2013,8:07 AM  Patient seen and examined, agree with above His air leak has improved  significantly and CXR shows near complete resolution of the pneumo CT to water seal

## 2013-08-14 ENCOUNTER — Inpatient Hospital Stay (HOSPITAL_COMMUNITY): Payer: Medicaid Other

## 2013-08-14 NOTE — Progress Notes (Signed)
7 Days Post-Op Procedure(s) (LRB): VIDEO ASSISTED THORACOSCOPY (VATS)/WEDGE RESECTION (Right) LYMPH NODE DISSECTION (Right) Subjective: C/o being "sore" at chest tube site  Objective: Vital signs in last 24 hours: Temp:  [97.7 F (36.5 C)-98.5 F (36.9 C)] 97.9 F (36.6 C) (03/18 0452) Pulse Rate:  [82-96] 82 (03/18 0452) Cardiac Rhythm:  [-] Sinus tachycardia (03/17 2000) Resp:  [18-20] 18 (03/18 0452) BP: (87-104)/(54-64) 88/63 mmHg (03/18 0452) SpO2:  [87 %-100 %] 100 % (03/18 0452)  Hemodynamic parameters for last 24 hours:    Intake/Output from previous day: 03/17 0701 - 03/18 0700 In: 480 [P.O.:480] Out: 850 [Urine:850] Intake/Output this shift:    General appearance: alert and no distress Heart: regular rate and rhythm Lungs: diminished breath sounds bilaterally Wound: clean and dry + air leak  Lab Results: No results found for this basename: WBC, HGB, HCT, PLT,  in the last 72 hours BMET: No results found for this basename: NA, K, CL, CO2, GLUCOSE, BUN, CREATININE, CALCIUM,  in the last 72 hours  PT/INR: No results found for this basename: LABPROT, INR,  in the last 72 hours ABG    Component Value Date/Time   PHART 7.392 08/08/2013 0421   HCO3 26.8* 08/08/2013 0421   TCO2 28 08/08/2013 0421   O2SAT 93.0 08/08/2013 0421   CBG (last 3)  No results found for this basename: GLUCAP,  in the last 72 hours  Assessment/Plan: S/P Procedure(s) (LRB): VIDEO ASSISTED THORACOSCOPY (VATS)/WEDGE RESECTION (Right) LYMPH NODE DISSECTION (Right) Overall doing well but has persistent air leak- will leave on water seal and recheck CXR in AM   LOS: 7 days    [name] 08/14/2013

## 2013-08-15 ENCOUNTER — Inpatient Hospital Stay (HOSPITAL_COMMUNITY): Payer: Medicaid Other

## 2013-08-15 MED ORDER — CARVEDILOL 3.125 MG PO TABS: 3.1250 mg | ORAL_TABLET | Freq: Two times a day (BID) | ORAL | Status: DC

## 2013-08-15 MED ORDER — OXYCODONE-ACETAMINOPHEN 5-325 MG PO TABS: 1.0000 | ORAL_TABLET | ORAL | Status: DC | PRN

## 2013-08-15 MED ORDER — LISINOPRIL 5 MG PO TABS: 5.0000 mg | ORAL_TABLET | Freq: Every day | ORAL | Status: DC

## 2013-08-15 NOTE — Progress Notes (Signed)
[place] changed to mini express per order.  Two RN's present for brief clamping of tube.  Connections retaped.  No drainage to document since beginning of shift.  Will cont plan of care.

## 2013-08-15 NOTE — Progress Notes (Addendum)
      [address]       [place] [address]             [phone]       8 Days Post-Op Procedure(s) (LRB): VIDEO ASSISTED THORACOSCOPY (VATS)/WEDGE RESECTION (Right) LYMPH NODE DISSECTION (Right)  Subjective: Patient without complaints.  Objective: Vital signs in last 24 hours: Temp:  [97.7 F (36.5 C)-98.6 F (37 C)] 98.6 F (37 C) (03/19 0634) Pulse Rate:  [89-101] 89 (03/19 0634) Cardiac Rhythm:  [-] Sinus tachycardia (03/18 0820) Resp:  [18] 18 (03/19 0634) BP: (93-96)/(65-76) 94/65 mmHg (03/19 0634) SpO2:  [93 %-96 %] 96 % (03/19 0634)    Intake/Output from previous day: 03/18 0701 - 03/19 0700 In: 480 [P.O.:480] Out: 20 [Chest Tube:20]   Physical Exam:  Cardiovascular: RRR Pulmonary: Clear to auscultation bilaterally; no rales, wheezes, or rhonchi. Abdomen: Soft, non tender, bowel sounds present. Extremities: No lower extremity edema. Wounds: Clean and dry.  No erythema or signs of infection. Chest Tube: to water seal, air leak with cough  Lab Results: CBC:No results found for this basename: WBC, HGB, HCT, PLT,  in the last 72 hours BMET: No results found for this basename: NA, K, CL, CO2, GLUCOSE, BUN, CREATININE, CALCIUM,  in the last 72 hours  PT/INR: No results found for this basename: LABPROT, INR,  in the last 72 hours ABG:  INR: Will add last result for INR, ABG once components are confirmed Will add last 4 CBG results once components are confirmed  Assessment/Plan:  1. CV - History of CAD.SR. On Lisinopril 5 daily and Coreg 3.125 bid. SBP continues to be labile. May need to stop Lisinopril. 2.  Pulmonary - Chest tube with 20 cc of output. CXR shows stable right pneumothorax . Chest tube to remain to water seal for now. May need a mini express.  ZIMMERMAN,DONIELLE MPA-C 08/15/2013,8:15 AM   Patient seen and examined, agree with above Will change to mini-x press, possibly home tomorrow

## 2013-08-16 ENCOUNTER — Inpatient Hospital Stay (HOSPITAL_COMMUNITY): Payer: Medicaid Other

## 2013-08-16 MED ORDER — LISINOPRIL 5 MG PO TABS: 5.0000 mg | ORAL_TABLET | Freq: Every day | ORAL | Status: DC

## 2013-08-16 NOTE — Progress Notes (Addendum)
      [address]       [place] [address]             [phone]       9 Days Post-Op Procedure(s) (LRB): VIDEO ASSISTED THORACOSCOPY (VATS)/WEDGE RESECTION (Right) LYMPH NODE DISSECTION (Right)  Subjective: Patient without complaints.  Objective: Vital signs in last 24 hours: Temp:  [97.6 F (36.4 C)-98.8 F (37.1 C)] 98.3 F (36.8 C) (03/20 0500) Pulse Rate:  [89-108] 89 (03/20 0500) Cardiac Rhythm:  [-] Normal sinus rhythm (03/19 2115) Resp:  [18] 18 (03/20 0500) BP: (70-94)/(50-62) 90/57 mmHg (03/20 0500) SpO2:  [91 %-96 %] 96 % (03/20 0500)    Intake/Output from previous day: 03/19 0701 - 03/20 0700 In: 300 [P.O.:300] Out: 20 [Chest Tube:20]   Physical Exam:  Cardiovascular: RRR Pulmonary: Clear to auscultation bilaterally; no rales, wheezes, or rhonchi. Abdomen: Soft, non tender, bowel sounds present. Extremities: No lower extremity edema. Wounds: Clean and dry.  No erythema or signs of infection. Chest Tube: to mini express, air leak  Lab Results: CBC:No results found for this basename: WBC, HGB, HCT, PLT,  in the last 72 hours BMET: No results found for this basename: NA, K, CL, CO2, GLUCOSE, BUN, CREATININE, CALCIUM,  in the last 72 hours  PT/INR: No results found for this basename: LABPROT, INR,  in the last 72 hours ABG:  INR: Will add last result for INR, ABG once components are confirmed Will add last 4 CBG results once components are confirmed  Assessment/Plan:  1. CV - History of CAD.SR. On Lisinopril 5 daily and Coreg 3.125 bid. SBP continues to be labile. Will hold Lisinopril this am. He will need to follow up with Dr. Stanford Breed regarding BP medications. 2.  Pulmonary - Chest tube with 20 cc of output. CXR shows stable right pneumothorax . Chest tube to mini express. There is an air leak. 3. Likely discharge with [place] MPA-C 08/16/2013,7:28 AM   Patient seen and examined, agree with above Will plan to  dc home with Ct to miniexpress and HHRN if HR issues resolve

## 2013-08-16 NOTE — Progress Notes (Signed)
Pt up at sink bathing heart rate up to 130, assisted back to bed, bp 105/68 heart rate 123, Donille Zimmerman Pa made aware, ok to give coreg for this am. Will continue to monitor. Joylene Draft A

## 2013-08-16 NOTE — Care Management Note (Signed)
    Page 1 of 2   08/16/2013     2:47:20 PM   CARE MANAGEMENT NOTE 08/16/2013  Patient:  [name], [name]   Account Number:  [id number]  Date Initiated:  08/08/2013  Documentation initiated by:  [name]  Subjective/Objective Assessment:   dx adenocarcinoma (R)LL    PCP  [name]     Action/Plan:   CHEST TUBE CHANGED TO MINI EXPRESS ON 3/19.  WILL NEED HHRN TO MANAGE CT.   Anticipated DC Date:  08/16/2013   Anticipated DC Plan:  [place]  CM consult      [facility] Choice  HOME HEALTH   Choice offered to / List presented to:  C-1 Patient        [place] arranged  HH-1 RN      [place].   Status of service:  Completed, signed off Medicare Important Message given?   (If response is "NO", the following Medicare IM given date fields will be blank) Date Medicare IM given:   Date Additional Medicare IM given:    Discharge Disposition:  [place]  Per UR Regulation:  Reviewed for med. necessity/level of care/duration of stay  If discussed at [place] of Stay Meetings, dates discussed:   08/13/2013  08/15/2013    Comments:  08/16/13 Garrett Eberwein,RN,BSN [phone] PT [place] WITH MOM; WILL NEED HHRN FOLLOW UP FOR MINI EXPRESS.  REFERRAL TO [facility], PER PT CHOICE.  START OF CARE 24-48H POST DC DATE.

## 2013-08-16 NOTE — Plan of Care (Signed)
Problem: Discharge Progression Outcomes Goal: Pneumonia & Flu vaccines given if indicated Outcome: Completed/Met Date Met:  08/16/13 Pt declines

## 2013-08-16 NOTE — Progress Notes (Signed)
DC home with dc instructions, prescriptions, reviewed instructions questions answered. Chest Tube intact to miniexpress Joylene Draft A

## 2013-08-17 NOTE — Progress Notes (Signed)
   CARE MANAGEMENT NOTE 08/17/2013  Patient:  [name], [name]   Account Number:  [id number]  Date Initiated:  08/08/2013  Documentation initiated by:  MAYO,HENRIETTA  Subjective/Objective Assessment:   dx adenocarcinoma (R)LL    PCP  Dr Kevan Ny     Action/Plan:   CHEST TUBE CHANGED TO MINI EXPRESS ON 3/19.  WILL NEED HHRN TO MANAGE CT.   Anticipated DC Date:  08/16/2013   Anticipated DC Plan:  [place]  CM consult      [facility] Choice  HOME HEALTH   Choice offered to / List presented to:  C-1 Patient        [place] arranged  HH-1 RN      [place].   Status of service:  Completed, signed off Medicare Important Message given?   (If response is "NO", the following Medicare IM given date fields will be blank) Date Medicare IM given:   Date Additional Medicare IM given:    Discharge Disposition:  [place]  Per UR Regulation:  Reviewed for med. necessity/level of care/duration of stay  If discussed at [place] of Stay Meetings, dates discussed:   08/13/2013  08/15/2013    Comments:  08/17/13 15:25 CM notified [facility] liason, [place] via text of pt discharge.  No other CM needs communicated.  Mariane Masters, BSN, [place].  08/16/13 JULIE AMERSON,RN,BSN [phone] PT FOR DC HOME TODAY WITH MOM; WILL NEED HHRN FOLLOW UP FOR MINI EXPRESS.  REFERRAL TO [facility], PER PT CHOICE.  START OF CARE 24-48H POST DC DATE.

## 2013-08-19 ENCOUNTER — Other Ambulatory Visit: Payer: Self-pay | Admitting: *Deleted

## 2013-08-19 DIAGNOSIS — R918 Other nonspecific abnormal finding of lung field: Secondary | ICD-10-CM

## 2013-08-20 ENCOUNTER — Encounter: Payer: Self-pay | Admitting: Thoracic Surgery (Cardiothoracic Vascular Surgery)

## 2013-08-20 ENCOUNTER — Ambulatory Visit (INDEPENDENT_AMBULATORY_CARE_PROVIDER_SITE_OTHER): Payer: Self-pay | Admitting: Thoracic Surgery (Cardiothoracic Vascular Surgery)

## 2013-08-20 ENCOUNTER — Ambulatory Visit
Admission: RE | Admit: 2013-08-20 | Discharge: 2013-08-20 | Disposition: A | Discharge status: Home / Self Care | Payer: Medicaid Other | Source: Ambulatory Visit | Attending: Thoracic Surgery (Cardiothoracic Vascular Surgery) | Admitting: Thoracic Surgery (Cardiothoracic Vascular Surgery)

## 2013-08-20 ENCOUNTER — Other Ambulatory Visit: Payer: Self-pay | Admitting: *Deleted

## 2013-08-20 VITALS — BP 89/56 | HR 114 | Resp 20 | Ht 67.0 in | Wt 130.0 lb

## 2013-08-20 DIAGNOSIS — Z09 Encounter for follow-up examination after completed treatment for conditions other than malignant neoplasm: Secondary | ICD-10-CM

## 2013-08-20 DIAGNOSIS — C3410 Malignant neoplasm of upper lobe, unspecified bronchus or lung: Secondary | ICD-10-CM

## 2013-08-20 DIAGNOSIS — R918 Other nonspecific abnormal finding of lung field: Secondary | ICD-10-CM

## 2013-08-20 DIAGNOSIS — R222 Localized swelling, mass and lump, trunk: Secondary | ICD-10-CM
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
## 2013-08-21 ENCOUNTER — Ambulatory Visit (HOSPITAL_COMMUNITY)
Admission: RE | Admit: 2013-08-21 | Discharge: 2013-08-21 | Disposition: A | Discharge status: Home / Self Care | Payer: Medicaid Other | Source: Ambulatory Visit | Attending: Thoracic Surgery (Cardiothoracic Vascular Surgery) | Admitting: Thoracic Surgery (Cardiothoracic Vascular Surgery)

## 2013-08-21 ENCOUNTER — Encounter (HOSPITAL_COMMUNITY)
Admission: RE | Disposition: A | Discharge status: Home / Self Care | Payer: Self-pay | Source: Ambulatory Visit | Attending: Thoracic Surgery (Cardiothoracic Vascular Surgery)

## 2013-08-21 ENCOUNTER — Other Ambulatory Visit: Payer: Self-pay

## 2013-08-21 ENCOUNTER — Ambulatory Visit (HOSPITAL_COMMUNITY): Payer: Medicaid Other

## 2013-08-21 DIAGNOSIS — Z7982 Long term (current) use of aspirin: Secondary | ICD-10-CM | POA: Insufficient documentation

## 2013-08-21 DIAGNOSIS — I2510 Atherosclerotic heart disease of native coronary artery without angina pectoris: Secondary | ICD-10-CM | POA: Insufficient documentation

## 2013-08-21 DIAGNOSIS — Z95810 Presence of automatic (implantable) cardiac defibrillator: Secondary | ICD-10-CM | POA: Insufficient documentation

## 2013-08-21 DIAGNOSIS — I252 Old myocardial infarction: Secondary | ICD-10-CM | POA: Insufficient documentation

## 2013-08-21 DIAGNOSIS — J930 Spontaneous tension pneumothorax: Secondary | ICD-10-CM

## 2013-08-21 DIAGNOSIS — F3289 Other specified depressive episodes: Secondary | ICD-10-CM | POA: Insufficient documentation

## 2013-08-21 DIAGNOSIS — E785 Hyperlipidemia, unspecified: Secondary | ICD-10-CM | POA: Insufficient documentation

## 2013-08-21 DIAGNOSIS — F329 Major depressive disorder, single episode, unspecified: Secondary | ICD-10-CM | POA: Insufficient documentation

## 2013-08-21 DIAGNOSIS — J9382 Other air leak: Secondary | ICD-10-CM

## 2013-08-21 DIAGNOSIS — J4489 Other specified chronic obstructive pulmonary disease: Secondary | ICD-10-CM | POA: Insufficient documentation

## 2013-08-21 DIAGNOSIS — C3490 Malignant neoplasm of unspecified part of unspecified bronchus or lung: Secondary | ICD-10-CM | POA: Insufficient documentation

## 2013-08-21 DIAGNOSIS — R51 Headache: Secondary | ICD-10-CM | POA: Insufficient documentation

## 2013-08-21 DIAGNOSIS — D649 Anemia, unspecified: Secondary | ICD-10-CM | POA: Insufficient documentation

## 2013-08-21 DIAGNOSIS — Z4682 Encounter for fitting and adjustment of non-vascular catheter: Secondary | ICD-10-CM | POA: Insufficient documentation

## 2013-08-21 DIAGNOSIS — I2589 Other forms of chronic ischemic heart disease: Secondary | ICD-10-CM | POA: Insufficient documentation

## 2013-08-21 DIAGNOSIS — J449 Chronic obstructive pulmonary disease, unspecified: Secondary | ICD-10-CM | POA: Insufficient documentation

## 2013-08-21 HISTORY — PX: CHEST TUBE INSERTION: SHX231

## 2013-08-21 SURGERY — CHEST TUBE INSERTION
Anesthesia: Choice | Laterality: Right

## 2013-08-21 NOTE — Procedures (Signed)
Procedure Note:   Chest tube removal  Pre Procedure Diagnosis: Non- Small Cell Lung Cancer Post Procedure Diagnosis: Non- Small Cell Lung Cancer  Procedure:  Surgical time out was performed.  Chest tube dressing was removed without difficulty.  The chest tube in place was clamped off.  The suture around the chest tube was cut.  Patient was instructed to take deep breath and hold.  Chest tube was easily removed.  Skin suture was secured and a clean and dry dressing was placed.  Post Procedure CXR:  Showed no evidence of pneumothorax, pacemaker on left  Follow up:  Patient tolerated procedure without difficulty.  He will follow up with Dr. Roxan Hockey on 08/27/2013 with a CXR prior to his appointment

## 2013-08-21 NOTE — Discharge Instructions (Signed)
Given to Patient  Verbally by Lissa Morales. Patient given follow up appointment card  And  Appoint for follow up cxr for next week.

## 2013-08-21 NOTE — Progress Notes (Signed)
Dressing to right anterior chest cdi.

## 2013-08-22 ENCOUNTER — Other Ambulatory Visit: Payer: Self-pay | Admitting: *Deleted

## 2013-08-22 DIAGNOSIS — R918 Other nonspecific abnormal finding of lung field: Secondary | ICD-10-CM

## 2013-08-22 NOTE — Interval H&P Note (Signed)
History and Physical Interval Note:  08/22/2013 11:53 AM  [name]  has presented today for surgery, with the diagnosis of pneumothorax  The various methods of treatment have been discussed with the patient and family. After consideration of risks, benefits and other options for treatment, the patient has consented to  Procedure(s): RIGHT CHEST TUBE REMOVAL   (MINOR PROCEDURE) (CASE WILL START AT 12:00)  (Right) as a surgical intervention .  The patient's history has been reviewed, patient examined, no change in status, stable for surgery.  I have reviewed the patient's chart and labs.  Questions were answered to the patient's satisfaction.     HENDRICKSON,STEVEN C
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
## 2013-08-23 ENCOUNTER — Encounter (HOSPITAL_COMMUNITY): Payer: Self-pay | Admitting: Thoracic Surgery (Cardiothoracic Vascular Surgery)

## 2013-08-26 ENCOUNTER — Encounter: Payer: Self-pay | Admitting: *Deleted

## 2013-08-26 NOTE — [facility] Oncology Navigator Note (Signed)
I called patient to check on his recovery and to remind him of his appointment tomorrow with [facility].  I left voice mail message with appointment.

## 2013-08-27 ENCOUNTER — Encounter: Payer: Self-pay | Admitting: *Deleted

## 2013-08-27 ENCOUNTER — Ambulatory Visit (INDEPENDENT_AMBULATORY_CARE_PROVIDER_SITE_OTHER): Payer: Medicaid Other | Admitting: Thoracic Surgery (Cardiothoracic Vascular Surgery)

## 2013-08-27 ENCOUNTER — Encounter: Payer: Self-pay | Admitting: Thoracic Surgery (Cardiothoracic Vascular Surgery)

## 2013-08-27 ENCOUNTER — Ambulatory Visit: Payer: Medicaid Other | Admitting: Thoracic Surgery (Cardiothoracic Vascular Surgery)

## 2013-08-27 ENCOUNTER — Ambulatory Visit
Admission: RE | Admit: 2013-08-27 | Discharge: 2013-08-27 | Disposition: A | Discharge status: Home / Self Care | Payer: Medicaid Other | Source: Ambulatory Visit | Attending: Thoracic Surgery (Cardiothoracic Vascular Surgery) | Admitting: Thoracic Surgery (Cardiothoracic Vascular Surgery)

## 2013-08-27 VITALS — BP 96/67 | HR 100 | Resp 20 | Ht 67.0 in | Wt 130.0 lb

## 2013-08-27 DIAGNOSIS — C3410 Malignant neoplasm of upper lobe, unspecified bronchus or lung: Secondary | ICD-10-CM

## 2013-08-27 DIAGNOSIS — R918 Other nonspecific abnormal finding of lung field: Secondary | ICD-10-CM

## 2013-08-27 DIAGNOSIS — Z09 Encounter for follow-up examination after completed treatment for conditions other than malignant neoplasm: Secondary | ICD-10-CM

## 2013-08-27 DIAGNOSIS — Z48812 Encounter for surgical aftercare following surgery on the circulatory system: Secondary | ICD-10-CM

## 2013-08-27 DIAGNOSIS — I2510 Atherosclerotic heart disease of native coronary artery without angina pectoris: Secondary | ICD-10-CM

## 2013-08-27 NOTE — [facility] Oncology Navigator Note (Unsigned)
Spoke with pt at [facility] today regarding smoking cessation.  He stated he has quit.  He states he does not have any urges to smoke and the smell of smoke makes him sick.  I listened as he talked about his smoking cessation.  I gave him my number to call if needing any more help with smoking cessation.  I encouraged his abstaining.
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
## 2013-08-29 ENCOUNTER — Encounter: Payer: Self-pay | Admitting: *Deleted

## 2013-08-29 NOTE — [facility] Oncology Navigator Note (Signed)
Called to check on patient and left a voice mail message with contact information.

## 2013-09-02 ENCOUNTER — Ambulatory Visit: Payer: Medicaid Other | Admitting: Physician Assistant

## 2013-09-04 ENCOUNTER — Encounter: Payer: Self-pay | Admitting: Physician Assistant

## 2013-09-04 ENCOUNTER — Ambulatory Visit (INDEPENDENT_AMBULATORY_CARE_PROVIDER_SITE_OTHER): Payer: Medicaid Other | Admitting: Physician Assistant

## 2013-09-04 VITALS — BP 90/68 | HR 86 | Ht 67.0 in | Wt 127.1 lb

## 2013-09-04 DIAGNOSIS — I2589 Other forms of chronic ischemic heart disease: Secondary | ICD-10-CM

## 2013-09-04 DIAGNOSIS — E785 Hyperlipidemia, unspecified: Secondary | ICD-10-CM

## 2013-09-04 DIAGNOSIS — I2510 Atherosclerotic heart disease of native coronary artery without angina pectoris: Secondary | ICD-10-CM

## 2013-09-04 DIAGNOSIS — C801 Malignant (primary) neoplasm, unspecified: Secondary | ICD-10-CM

## 2013-09-04 DIAGNOSIS — Z95810 Presence of automatic (implantable) cardiac defibrillator: Secondary | ICD-10-CM

## 2013-09-04 DIAGNOSIS — I255 Ischemic cardiomyopathy: Secondary | ICD-10-CM

## 2013-09-04 MED ORDER — LISINOPRIL 5 MG PO TABS: 2.5000 mg | ORAL_TABLET | Freq: Every day | ORAL | Status: DC
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
## 2013-09-04 NOTE — Patient Instructions (Signed)
DECREASE LISINOPRIL TO 2.5 MG DAILY (THIS WILL BE 1/2 TAB DAILY)  YOU HAVE A FOLLOW UP WITH [place], [facility] 10/31/13 @ 2 PM

## 2013-09-11 ENCOUNTER — Telehealth: Payer: Self-pay | Admitting: Internal Medicine

## 2013-09-11 DIAGNOSIS — J439 Emphysema, unspecified: Secondary | ICD-10-CM

## 2013-09-11 NOTE — Telephone Encounter (Signed)
ono 09/01/13 shows lowest pulse ox 78%, mean pulse ox 85.67%and time < 88% at 2h 23 minutes  Please start him on 2L [place] at night  Dr. Brand Males, M.D., [facility].C.P Pulmonary and Critical Care Medicine Staff Physician [facility] Pager: [phone], If no answer or between  15:00h - 7:00h: call [phone]  09/11/2013 8:53 PM

## 2013-09-12 NOTE — Telephone Encounter (Signed)
lmomtcb x1 for pt 

## 2013-09-13 NOTE — Telephone Encounter (Signed)
I spoke with patient about results and he verbalized understanding and had no questions Order placed

## 2013-09-18 ENCOUNTER — Encounter: Payer: Self-pay | Admitting: *Deleted

## 2013-09-18 NOTE — [facility] On[facility] Navigator Note (Signed)
I called patient to check in.  He reports that he is doing pretty well.  He has recently started using oxygen at night and he says that helps his breathing a little.  He denies smoking.  His pain is resolved with the exception of a little continued soreness.  He denied any questions or concerns at this time.  We reviewed his next appointment with Dr. Roxan Hockey and I encouraged him to call me for any needs.

## 2013-09-23 ENCOUNTER — Telehealth: Payer: Self-pay | Admitting: *Deleted

## 2013-09-23 NOTE — Telephone Encounter (Signed)
Called pt to follow up regarding smoking cessation.  Left vm message to call if he needed any assistance with smoking cessation

## 2013-10-14 ENCOUNTER — Telehealth: Payer: Self-pay | Admitting: Cardiology

## 2013-10-14 ENCOUNTER — Encounter: Payer: Medicaid Other | Admitting: *Deleted

## 2013-10-14 NOTE — Telephone Encounter (Signed)
Spoke with pt and reminded pt of remote transmission that is due today. Pt stated that he was unsure of how to send transmissions. I verbalized to pt how to start transmission and that when the last light (the stars) lite up that's when the transmission was completed.

## 2013-10-15 ENCOUNTER — Encounter: Payer: Self-pay | Admitting: Cardiology

## 2013-10-16 ENCOUNTER — Encounter: Payer: Self-pay | Admitting: Internal Medicine

## 2013-10-21 ENCOUNTER — Encounter: Payer: Self-pay | Admitting: Internal Medicine

## 2013-10-21 DIAGNOSIS — I2589 Other forms of chronic ischemic heart disease: Secondary | ICD-10-CM

## 2013-10-22 ENCOUNTER — Encounter: Payer: Self-pay | Admitting: Internal Medicine

## 2013-10-22 ENCOUNTER — Ambulatory Visit (INDEPENDENT_AMBULATORY_CARE_PROVIDER_SITE_OTHER): Payer: Medicaid Other | Admitting: *Deleted

## 2013-10-22 ENCOUNTER — Ambulatory Visit: Payer: Medicaid Other | Admitting: Internal Medicine

## 2013-10-22 VITALS — BP 120/76 | HR 85 | Ht 68.0 in | Wt 128.2 lb

## 2013-10-22 DIAGNOSIS — I2510 Atherosclerotic heart disease of native coronary artery without angina pectoris: Secondary | ICD-10-CM

## 2013-10-22 DIAGNOSIS — C3490 Malignant neoplasm of unspecified part of unspecified bronchus or lung: Secondary | ICD-10-CM

## 2013-10-22 DIAGNOSIS — J438 Other emphysema: Secondary | ICD-10-CM

## 2013-10-22 DIAGNOSIS — J439 Emphysema, unspecified: Secondary | ICD-10-CM

## 2013-10-22 DIAGNOSIS — I255 Ischemic cardiomyopathy: Secondary | ICD-10-CM

## 2013-10-22 DIAGNOSIS — I5022 Chronic systolic (congestive) heart failure: Secondary | ICD-10-CM

## 2013-10-22 DIAGNOSIS — J479 Bronchiectasis, uncomplicated: Secondary | ICD-10-CM

## 2013-10-22 DIAGNOSIS — F17200 Nicotine dependence, unspecified, uncomplicated: Secondary | ICD-10-CM

## 2013-10-22 LAB — MDC_IDC_ENUM_SESS_TYPE_REMOTE
Battery Remaining Longevity: 94 mo
Battery Remaining Percentage: 93 %
Battery Voltage: 3.19 V
Brady Statistic RV Percent Paced: 1 %
Date Time Interrogation Session: 20150526004213
HighPow Impedance: 60 Ohm
HighPow Impedance: 60 Ohm
Implantable Pulse Generator Serial Number: 7132440
Lead Channel Impedance Value: 360 Ohm
Lead Channel Sensing Intrinsic Amplitude: 11.3 mV
Lead Channel Setting Sensing Sensitivity: 0.5 mV
MDC IDC MSMT LEADCHNL RV PACING THRESHOLD AMPLITUDE: 0.75 V
MDC IDC MSMT LEADCHNL RV PACING THRESHOLD PULSEWIDTH: 0.5 ms
MDC IDC SET LEADCHNL RV PACING AMPLITUDE: 2.5 V
MDC IDC SET LEADCHNL RV PACING PULSEWIDTH: 0.5 ms
MDC IDC SET ZONE DETECTION INTERVAL: 250 ms
Zone Setting Detection Interval: 300 ms

## 2013-10-22 MED ORDER — FLUTICASONE-SALMETEROL 100-50 MCG/DOSE IN AEPB: 1.0000 | INHALATION_SPRAY | Freq: Two times a day (BID) | RESPIRATORY_TRACT | Status: DC

## 2013-10-22 NOTE — Patient Instructions (Signed)
#  Smoking  - glad you quit; stay quit   #COPD - continue spiriva as before - add advair 100/50, 1 puff twice daily - refer pulmonary rehab  #cancer surveillance  - initially through Dr Roxan Hockey  #Followup  - do PFT in 3 months   - return to see me in 3 months
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
## 2013-10-22 NOTE — Assessment & Plan Note (Signed)
Fu with Dr Roxan Hockey

## 2013-10-22 NOTE — Assessment & Plan Note (Signed)
Congratulated on quitting

## 2013-10-22 NOTE — Assessment & Plan Note (Signed)
Emphysema lung - continue spiriva as before - add advair 100/50, 1 puff twice daily - refer pulmonary rehab   #Followup  - do PFT in 3 months   - return to see me in 3 months

## 2013-10-23 NOTE — Progress Notes (Signed)
Remote ICD transmission.   

## 2013-10-28 ENCOUNTER — Telehealth (HOSPITAL_COMMUNITY): Payer: Self-pay

## 2013-10-28 ENCOUNTER — Other Ambulatory Visit: Payer: Self-pay | Admitting: Thoracic Surgery (Cardiothoracic Vascular Surgery)

## 2013-10-28 DIAGNOSIS — C3490 Malignant neoplasm of unspecified part of unspecified bronchus or lung: Secondary | ICD-10-CM

## 2013-10-28 NOTE — Telephone Encounter (Signed)
Patient states that they will not be able to attend Pulmonary Rehab because they have no income.  The patient has medicaid and medicaid does not pay anything for Pulmonary Rehab.  The patient would be responsible for the entirety of the cost.

## 2013-10-29 ENCOUNTER — Ambulatory Visit
Admission: RE | Admit: 2013-10-29 | Discharge: 2013-10-29 | Disposition: A | Discharge status: Home / Self Care | Payer: Medicaid Other | Source: Ambulatory Visit | Attending: Thoracic Surgery (Cardiothoracic Vascular Surgery) | Admitting: Thoracic Surgery (Cardiothoracic Vascular Surgery)

## 2013-10-29 ENCOUNTER — Ambulatory Visit (INDEPENDENT_AMBULATORY_CARE_PROVIDER_SITE_OTHER): Payer: Self-pay | Admitting: Thoracic Surgery (Cardiothoracic Vascular Surgery)

## 2013-10-29 ENCOUNTER — Encounter: Payer: Self-pay | Admitting: Thoracic Surgery (Cardiothoracic Vascular Surgery)

## 2013-10-29 VITALS — BP 124/84 | HR 79 | Resp 20 | Ht 68.0 in | Wt 128.0 lb

## 2013-10-29 DIAGNOSIS — C3410 Malignant neoplasm of upper lobe, unspecified bronchus or lung: Secondary | ICD-10-CM

## 2013-10-29 DIAGNOSIS — C3490 Malignant neoplasm of unspecified part of unspecified bronchus or lung: Secondary | ICD-10-CM
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
## 2013-10-31 ENCOUNTER — Ambulatory Visit: Payer: Medicaid Other | Admitting: Physician Assistant

## 2013-11-13 ENCOUNTER — Encounter: Payer: Self-pay | Admitting: Cardiology

## 2013-11-26 ENCOUNTER — Encounter: Payer: Self-pay | Admitting: Physician Assistant

## 2013-11-26 ENCOUNTER — Ambulatory Visit (INDEPENDENT_AMBULATORY_CARE_PROVIDER_SITE_OTHER): Payer: Medicaid Other | Admitting: Physician Assistant

## 2013-11-26 VITALS — BP 111/80 | HR 56 | Ht 68.0 in | Wt 124.8 lb

## 2013-11-26 DIAGNOSIS — I255 Ischemic cardiomyopathy: Secondary | ICD-10-CM

## 2013-11-26 DIAGNOSIS — I2510 Atherosclerotic heart disease of native coronary artery without angina pectoris: Secondary | ICD-10-CM

## 2013-11-26 DIAGNOSIS — C801 Malignant (primary) neoplasm, unspecified: Secondary | ICD-10-CM

## 2013-11-26 DIAGNOSIS — Z95810 Presence of automatic (implantable) cardiac defibrillator: Secondary | ICD-10-CM

## 2013-11-26 DIAGNOSIS — I2589 Other forms of chronic ischemic heart disease: Secondary | ICD-10-CM
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
## 2013-11-26 NOTE — Patient Instructions (Signed)
NO CHANGES WERE MADE TODAY  FOLLOW UP WITH DR. CRENSHAW IN SEPT

## 2013-11-27 ENCOUNTER — Encounter: Payer: Self-pay | Admitting: Cardiology

## 2013-12-19 ENCOUNTER — Encounter (HOSPITAL_COMMUNITY)
Admission: RE | Admit: 2013-12-19 | Discharge: 2013-12-19 | Disposition: A | Discharge status: Home / Self Care | Payer: Medicaid Other | Source: Ambulatory Visit | Attending: Cardiology | Admitting: Cardiology

## 2013-12-19 ENCOUNTER — Telehealth (HOSPITAL_COMMUNITY): Payer: Self-pay | Admitting: *Deleted

## 2013-12-19 DIAGNOSIS — I2589 Other forms of chronic ischemic heart disease: Secondary | ICD-10-CM | POA: Insufficient documentation

## 2013-12-19 DIAGNOSIS — Z5189 Encounter for other specified aftercare: Secondary | ICD-10-CM | POA: Insufficient documentation

## 2013-12-19 DIAGNOSIS — I5022 Chronic systolic (congestive) heart failure: Secondary | ICD-10-CM | POA: Insufficient documentation

## 2013-12-19 DIAGNOSIS — I2510 Atherosclerotic heart disease of native coronary artery without angina pectoris: Secondary | ICD-10-CM | POA: Insufficient documentation

## 2013-12-19 DIAGNOSIS — E785 Hyperlipidemia, unspecified: Secondary | ICD-10-CM | POA: Insufficient documentation

## 2013-12-19 NOTE — Progress Notes (Signed)
Cardiac Rehab Medication Review by a Pharmacist  Does the patient  feel that his/her medications are working for him/her?  yes  Has the patient been experiencing any side effects to the medications prescribed?  no  Does the patient measure his/her own blood pressure or blood glucose at home?  no   Does the patient have any problems obtaining medications due to transportation or finances?   no  Understanding of regimen: good Understanding of indications: good Potential of compliance: good    Pharmacist comments: Patient is a 53 year old male who presents to cardiac rehab today for review of his medications. He brought a medication list with him and did not seem to express any concerns at this time. He reports no side effects or issues with his medications and reports adherence.  Raylen Tangonan L. Nicole Kindred, PharmD Clinical Pharmacy Resident Pager: [phone] 12/19/2013 8:23 AM

## 2013-12-19 NOTE — Telephone Encounter (Signed)
Message copied by Rowe Pavy on Thu Dec 19, 2013 10:58 AM ------      Message from: Modesto Charon MD C      Created: Thu Dec 19, 2013  9:51 AM      Regarding: RE: Restrictions to exercise       No there are no restrictions            [facility]      ----- Message -----         From: Rowe Pavy, RN         Sent: 12/18/2013   4:19 PM           To: Melrose Nakayama, MD      Subject: Restrictions to exercise                                       Pt referred to cardiac rehab with diagnosis of CHF.  Pt is also s/p RLL wedge resection by you on 07/2013.            Any restrictions of activity he should avoid during exercise?            If so please indicate.            Thanks so much for your input and we are looking forward to working with him.            Maurice Small RN       ------

## 2013-12-19 NOTE — Telephone Encounter (Signed)
Message copied by Rowe Pavy on Thu Dec 19, 2013  8:54 AM ------      Message from: Lelon Perla      Created: Wed Dec 18, 2013  4:57 PM      Regarding: RE: Madaline Brilliant to use O2 if needed during exercise       Yes      Kirk Ruths            ----- Message -----         From: Rowe Pavy, RN         Sent: 12/18/2013   4:24 PM           To: Lelon Perla, MD      Subject: Ok to use O2 if needed during exercise                         Pt will begin participating with [place] in cardiac rehab.  Pt uses O2 therapy at night averages about 2-3 times a week due to SOB.  We will monitor o2 sats during exercise.  May we place O2 therapy for sats less than 90?            Thanks for your input and we are looking forward to working with this pt.            Maurice Small RN       ------

## 2013-12-23 ENCOUNTER — Encounter (HOSPITAL_COMMUNITY)
Admission: RE | Admit: 2013-12-23 | Discharge: 2013-12-23 | Disposition: A | Discharge status: Home / Self Care | Payer: Medicaid Other | Source: Ambulatory Visit | Attending: Cardiology | Admitting: Cardiology

## 2013-12-23 DIAGNOSIS — I5022 Chronic systolic (congestive) heart failure: Secondary | ICD-10-CM | POA: Diagnosis not present

## 2013-12-23 DIAGNOSIS — I2589 Other forms of chronic ischemic heart disease: Secondary | ICD-10-CM | POA: Diagnosis not present

## 2013-12-23 DIAGNOSIS — I2510 Atherosclerotic heart disease of native coronary artery without angina pectoris: Secondary | ICD-10-CM | POA: Diagnosis present

## 2013-12-23 DIAGNOSIS — Z5189 Encounter for other specified aftercare: Secondary | ICD-10-CM | POA: Diagnosis present

## 2013-12-23 DIAGNOSIS — E785 Hyperlipidemia, unspecified: Secondary | ICD-10-CM | POA: Diagnosis not present

## 2013-12-23 NOTE — Progress Notes (Signed)
Pt in today for his first day of exercise at 1115 exercise class.  Pt tolerated exercise with no complaints.  Monitor showed Sr with no noted ectopy.  Pt with low QOL survey responses.  Will review PHQ2 score on next session.  Pt has limited support system.  Will provide emotional support for pt.  Medication list reconciled.  Pt short term increase stamina and long term goal back to normal.  Will monitor his MET levels and check back with him on activities he finds pleasurable. Cherre Huger, BSN

## 2013-12-25 ENCOUNTER — Encounter (HOSPITAL_COMMUNITY)
Admission: RE | Admit: 2013-12-25 | Discharge: 2013-12-25 | Disposition: A | Discharge status: Home / Self Care | Payer: Medicaid Other | Source: Ambulatory Visit | Attending: Cardiology | Admitting: Cardiology

## 2013-12-25 DIAGNOSIS — Z5189 Encounter for other specified aftercare: Secondary | ICD-10-CM | POA: Diagnosis not present

## 2013-12-27 ENCOUNTER — Encounter (HOSPITAL_COMMUNITY)
Admission: RE | Admit: 2013-12-27 | Discharge: 2013-12-27 | Disposition: A | Discharge status: Home / Self Care | Payer: Medicaid Other | Source: Ambulatory Visit | Attending: Cardiology | Admitting: Cardiology

## 2013-12-27 DIAGNOSIS — Z5189 Encounter for other specified aftercare: Secondary | ICD-10-CM | POA: Diagnosis not present

## 2013-12-30 ENCOUNTER — Encounter (HOSPITAL_COMMUNITY)
Admission: RE | Admit: 2013-12-30 | Discharge: 2013-12-30 | Disposition: A | Discharge status: Home / Self Care | Payer: Medicaid Other | Source: Ambulatory Visit | Attending: Cardiology | Admitting: Cardiology

## 2013-12-30 DIAGNOSIS — I509 Heart failure, unspecified: Secondary | ICD-10-CM | POA: Diagnosis present

## 2014-01-01 ENCOUNTER — Encounter (HOSPITAL_COMMUNITY)
Admission: RE | Admit: 2014-01-01 | Discharge: 2014-01-01 | Disposition: A | Discharge status: Home / Self Care | Payer: Medicaid Other | Source: Ambulatory Visit | Attending: Cardiology | Admitting: Cardiology

## 2014-01-01 DIAGNOSIS — I509 Heart failure, unspecified: Secondary | ICD-10-CM | POA: Diagnosis not present

## 2014-01-03 ENCOUNTER — Encounter (HOSPITAL_COMMUNITY)
Admission: RE | Admit: 2014-01-03 | Discharge: 2014-01-03 | Disposition: A | Discharge status: Home / Self Care | Payer: Medicaid Other | Source: Ambulatory Visit | Attending: Cardiology | Admitting: Cardiology

## 2014-01-03 DIAGNOSIS — I509 Heart failure, unspecified: Secondary | ICD-10-CM | POA: Diagnosis not present

## 2014-01-03 NOTE — Progress Notes (Signed)
Reviewed home exercise with pt today.  Pt plans to continue walking at home for exercise.  Reviewed THR, pulse, RPE, sign and symptoms, and when to call 911 or MD.  Since pt also has pulmonary issues, we reviewed pursed lip and diaphragmatic breathing techniques. Pt voiced understanding. Alberteen Sam, MA, ACSM RCEP

## 2014-01-06 ENCOUNTER — Encounter (HOSPITAL_COMMUNITY)
Admission: RE | Admit: 2014-01-06 | Discharge: 2014-01-06 | Disposition: A | Discharge status: Home / Self Care | Payer: Medicaid Other | Source: Ambulatory Visit | Attending: Cardiology | Admitting: Cardiology

## 2014-01-06 DIAGNOSIS — I509 Heart failure, unspecified: Secondary | ICD-10-CM | POA: Diagnosis not present

## 2014-01-08 ENCOUNTER — Encounter (HOSPITAL_COMMUNITY)
Admission: RE | Admit: 2014-01-08 | Discharge: 2014-01-08 | Disposition: A | Discharge status: Home / Self Care | Payer: Medicaid Other | Source: Ambulatory Visit | Attending: Cardiology | Admitting: Cardiology

## 2014-01-08 DIAGNOSIS — I509 Heart failure, unspecified: Secondary | ICD-10-CM | POA: Diagnosis not present

## 2014-01-10 ENCOUNTER — Other Ambulatory Visit: Payer: Self-pay | Admitting: *Deleted

## 2014-01-10 ENCOUNTER — Encounter (HOSPITAL_COMMUNITY)
Admission: RE | Admit: 2014-01-10 | Discharge: 2014-01-10 | Disposition: A | Discharge status: Home / Self Care | Payer: Medicaid Other | Source: Ambulatory Visit | Attending: Cardiology | Admitting: Cardiology

## 2014-01-10 DIAGNOSIS — R222 Localized swelling, mass and lump, trunk: Secondary | ICD-10-CM

## 2014-01-10 DIAGNOSIS — I509 Heart failure, unspecified: Secondary | ICD-10-CM | POA: Diagnosis not present

## 2014-01-13 ENCOUNTER — Encounter (HOSPITAL_COMMUNITY)
Admission: RE | Admit: 2014-01-13 | Discharge: 2014-01-13 | Disposition: A | Discharge status: Home / Self Care | Payer: Medicaid Other | Source: Ambulatory Visit | Attending: Cardiology | Admitting: Cardiology

## 2014-01-13 DIAGNOSIS — I509 Heart failure, unspecified: Secondary | ICD-10-CM | POA: Diagnosis not present

## 2014-01-15 ENCOUNTER — Encounter (HOSPITAL_COMMUNITY)
Admission: RE | Admit: 2014-01-15 | Discharge: 2014-01-15 | Disposition: A | Discharge status: Home / Self Care | Payer: Medicaid Other | Source: Ambulatory Visit | Attending: Cardiology | Admitting: Cardiology

## 2014-01-15 DIAGNOSIS — I509 Heart failure, unspecified: Secondary | ICD-10-CM | POA: Diagnosis not present

## 2014-01-17 ENCOUNTER — Encounter (HOSPITAL_COMMUNITY)
Admission: RE | Admit: 2014-01-17 | Discharge: 2014-01-17 | Disposition: A | Discharge status: Home / Self Care | Payer: Medicaid Other | Source: Ambulatory Visit | Attending: Cardiology | Admitting: Cardiology

## 2014-01-17 DIAGNOSIS — I509 Heart failure, unspecified: Secondary | ICD-10-CM | POA: Diagnosis not present

## 2014-01-20 ENCOUNTER — Encounter (HOSPITAL_COMMUNITY)
Admission: RE | Admit: 2014-01-20 | Discharge: 2014-01-20 | Disposition: A | Discharge status: Home / Self Care | Payer: Medicaid Other | Source: Ambulatory Visit | Attending: Cardiology | Admitting: Cardiology

## 2014-01-20 DIAGNOSIS — I509 Heart failure, unspecified: Secondary | ICD-10-CM | POA: Diagnosis not present

## 2014-01-22 ENCOUNTER — Encounter (HOSPITAL_COMMUNITY)
Admission: RE | Admit: 2014-01-22 | Discharge: 2014-01-22 | Disposition: A | Discharge status: Home / Self Care | Payer: Medicaid Other | Source: Ambulatory Visit | Attending: Cardiology | Admitting: Cardiology

## 2014-01-22 DIAGNOSIS — I509 Heart failure, unspecified: Secondary | ICD-10-CM | POA: Diagnosis not present

## 2014-01-24 ENCOUNTER — Encounter (HOSPITAL_COMMUNITY)
Admission: RE | Admit: 2014-01-24 | Discharge: 2014-01-24 | Disposition: A | Discharge status: Home / Self Care | Payer: Medicaid Other | Source: Ambulatory Visit | Attending: Cardiology | Admitting: Cardiology

## 2014-01-24 DIAGNOSIS — I509 Heart failure, unspecified: Secondary | ICD-10-CM | POA: Diagnosis not present

## 2014-01-27 ENCOUNTER — Encounter (HOSPITAL_COMMUNITY)
Admission: RE | Admit: 2014-01-27 | Discharge: 2014-01-27 | Disposition: A | Discharge status: Home / Self Care | Payer: Medicaid Other | Source: Ambulatory Visit | Attending: Cardiology | Admitting: Cardiology

## 2014-01-27 DIAGNOSIS — I509 Heart failure, unspecified: Secondary | ICD-10-CM | POA: Diagnosis not present

## 2014-01-29 ENCOUNTER — Telehealth: Payer: Self-pay | Admitting: Internal Medicine

## 2014-01-29 ENCOUNTER — Other Ambulatory Visit: Payer: Self-pay | Admitting: *Deleted

## 2014-01-29 ENCOUNTER — Encounter (HOSPITAL_COMMUNITY)
Admission: RE | Admit: 2014-01-29 | Discharge: 2014-01-29 | Disposition: A | Discharge status: Home / Self Care | Payer: Medicaid Other | Source: Ambulatory Visit | Attending: Cardiology | Admitting: Cardiology

## 2014-01-29 DIAGNOSIS — I509 Heart failure, unspecified: Secondary | ICD-10-CM | POA: Insufficient documentation

## 2014-01-29 MED ORDER — FLUTICASONE-SALMETEROL 100-50 MCG/DOSE IN AEPB: 1.0000 | INHALATION_SPRAY | Freq: Two times a day (BID) | RESPIRATORY_TRACT | Status: DC

## 2014-01-29 MED ORDER — TIOTROPIUM BROMIDE MONOHYDRATE 18 MCG IN CAPS: 18.0000 ug | ORAL_CAPSULE | Freq: Every day | RESPIRATORY_TRACT | Status: DC

## 2014-01-29 MED ORDER — EZETIMIBE 10 MG PO TABS: 10.0000 mg | ORAL_TABLET | Freq: Every day | ORAL | Status: DC

## 2014-01-29 MED ORDER — LISINOPRIL 5 MG PO TABS: 5.0000 mg | ORAL_TABLET | Freq: Every day | ORAL | Status: DC

## 2014-01-29 MED ORDER — SIMVASTATIN 40 MG PO TABS: 40.0000 mg | ORAL_TABLET | Freq: Every day | ORAL | Status: DC

## 2014-01-29 MED ORDER — ASPIRIN EC 81 MG PO TBEC: 81.0000 mg | DELAYED_RELEASE_TABLET | Freq: Every day | ORAL | Status: DC

## 2014-01-29 MED ORDER — CARVEDILOL 3.125 MG PO TABS: 3.1250 mg | ORAL_TABLET | Freq: Two times a day (BID) | ORAL | Status: DC

## 2014-01-29 NOTE — Progress Notes (Signed)
[name] 53 y.o. male Nutrition Note Spoke with pt. Nutrition Plan and Nutrition Survey goals reviewed with pt. Pt is following Step 2 of the Therapeutic Lifestyle Changes diet. Pt eats 1 meal/d due to decreased appetite and financial difficulties. Pt states he receives $190/mo in food stamps and has no other income at this time. Pt would like to gain wt but has maintained his wt around 125-130 lb "since [employer]." Ways to increase calorie and protein consumption discussed. Pt expressed understanding of the information reviewed.   Nutrition Diagnosis   Food-and nutrition-related knowledge deficit related to lack of exposure to information as related to diagnosis of: ? CVD   Nutrition RX/ Estimated Daily Nutrition Needs for: wt gain  2600-3100 Kcal, 70-85 gm fat, 17-21 gm sat fat, 2.5-3.1 gm trans-fat, <1500 mg sodium  Nutrition Intervention   Pt's individual nutrition plan reviewed with pt.   Benefits of adopting Therapeutic Lifestyle Changes discussed when Medficts reviewed.   Pt to attend the Portion Distortion class - met; 01/01/14   Pt to attend the  ? Nutrition I class  - met; 01/28/14                    ? Nutrition II class   Pt given handouts for: ? Food Resources in the [employer]    Continue client-centered nutrition education by RD, as part of interdisciplinary care. Goal(s)   Pt to describe the benefit of including fruits, vegetables, whole grains, and low-fat dairy products in a heart healthy meal plan. Monitor and Evaluate progress toward nutrition goal with team. Nutrition Risk: Change to Moderate [name], M.Ed, RD, LDN, CDE 01/29/2014 12:37 PM

## 2014-01-29 NOTE — Telephone Encounter (Signed)
Called spoke with pt. Aware both advair and spiriva have been sent. Nothing further needed

## 2014-01-30 ENCOUNTER — Ambulatory Visit (INDEPENDENT_AMBULATORY_CARE_PROVIDER_SITE_OTHER): Payer: Medicaid Other | Admitting: Internal Medicine

## 2014-01-30 ENCOUNTER — Ambulatory Visit: Payer: Medicaid Other | Admitting: Internal Medicine

## 2014-01-30 VITALS — BP 88/52 | HR 85 | Ht 67.0 in | Wt 122.0 lb

## 2014-01-30 DIAGNOSIS — I2589 Other forms of chronic ischemic heart disease: Secondary | ICD-10-CM

## 2014-01-30 DIAGNOSIS — Z95810 Presence of automatic (implantable) cardiac defibrillator: Secondary | ICD-10-CM

## 2014-01-30 DIAGNOSIS — I255 Ischemic cardiomyopathy: Secondary | ICD-10-CM

## 2014-01-30 DIAGNOSIS — I5022 Chronic systolic (congestive) heart failure: Secondary | ICD-10-CM

## 2014-01-30 LAB — MDC_IDC_ENUM_SESS_TYPE_INCLINIC
Battery Remaining Longevity: 93.6 mo
HighPow Impedance: 73.125
Lead Channel Impedance Value: 387.5 Ohm
Lead Channel Pacing Threshold Pulse Width: 0.5 ms
Lead Channel Sensing Intrinsic Amplitude: 11.3 mV
Lead Channel Setting Pacing Pulse Width: 0.5 ms
Lead Channel Setting Sensing Sensitivity: 0.5 mV
MDC IDC MSMT LEADCHNL RV PACING THRESHOLD AMPLITUDE: 0.75 V
MDC IDC PG SERIAL: 7132440
MDC IDC SESS DTM: 20150903142141
MDC IDC SET LEADCHNL RV PACING AMPLITUDE: 2.5 V
MDC IDC SET ZONE DETECTION INTERVAL: 300 ms
MDC IDC STAT BRADY RV PERCENT PACED: 0 %
Zone Setting Detection Interval: 250 ms
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
## 2014-01-30 NOTE — Patient Instructions (Addendum)
Your physician recommends that you continue on your current medications as directed. Please refer to the Current Medication list given to you today.  Remote monitoring is used to monitor your Pacemaker of ICD from home. This monitoring reduces the number of office visits required to check your device to one time per year. It allows [place] to keep an eye on the functioning of your device to ensure it is working properly. You are scheduled for a device check from home on 05/05/14. You may send your transmission at any time that day. If you have a wireless device, the transmission will be sent automatically. After your physician reviews your transmission, you will receive a postcard with your next transmission date.  Your physician wants you to follow-up in: 12 months with Dr. Caryl Comes. You will receive a reminder letter in the mail two months in advance. If you don't receive a letter, please call our office to schedule the follow-up appointment.   Please contiune with your appt with Dr. Stanford Breed on 9.21.15 @ 11.15 am @ our [employer] office

## 2014-01-31 ENCOUNTER — Encounter: Payer: Self-pay | Admitting: Internal Medicine

## 2014-01-31 ENCOUNTER — Encounter (HOSPITAL_COMMUNITY)
Admission: RE | Admit: 2014-01-31 | Discharge: 2014-01-31 | Disposition: A | Discharge status: Home / Self Care | Payer: Medicaid Other | Source: Ambulatory Visit | Attending: Cardiology | Admitting: Cardiology

## 2014-01-31 DIAGNOSIS — I509 Heart failure, unspecified: Secondary | ICD-10-CM | POA: Diagnosis not present

## 2014-02-05 ENCOUNTER — Encounter (HOSPITAL_COMMUNITY)
Admission: RE | Admit: 2014-02-05 | Discharge: 2014-02-05 | Disposition: A | Discharge status: Home / Self Care | Payer: Medicaid Other | Source: Ambulatory Visit | Attending: Cardiology | Admitting: Cardiology

## 2014-02-05 DIAGNOSIS — I509 Heart failure, unspecified: Secondary | ICD-10-CM | POA: Diagnosis not present

## 2014-02-07 ENCOUNTER — Encounter (HOSPITAL_COMMUNITY)
Admission: RE | Admit: 2014-02-07 | Discharge: 2014-02-07 | Disposition: A | Discharge status: Home / Self Care | Payer: Medicaid Other | Source: Ambulatory Visit | Attending: Cardiology | Admitting: Cardiology

## 2014-02-07 DIAGNOSIS — I509 Heart failure, unspecified: Secondary | ICD-10-CM | POA: Diagnosis not present

## 2014-02-10 ENCOUNTER — Encounter (HOSPITAL_COMMUNITY)
Admission: RE | Admit: 2014-02-10 | Discharge: 2014-02-10 | Disposition: A | Discharge status: Home / Self Care | Payer: Medicaid Other | Source: Ambulatory Visit | Attending: Cardiology | Admitting: Cardiology

## 2014-02-10 DIAGNOSIS — I509 Heart failure, unspecified: Secondary | ICD-10-CM | POA: Diagnosis not present

## 2014-02-10 NOTE — Progress Notes (Addendum)
Low exit bp- after exercise.  Pt asymptomatic. Pt history of soft bp noted despite not being at goal for CHF medication therapy.  Will fax to MD - Dr.  Stanford Breed for review. Cherre Huger, BSN

## 2014-02-12 ENCOUNTER — Encounter (HOSPITAL_COMMUNITY)
Admission: RE | Admit: 2014-02-12 | Discharge: 2014-02-12 | Disposition: A | Discharge status: Home / Self Care | Payer: Medicaid Other | Source: Ambulatory Visit | Attending: Cardiology | Admitting: Cardiology

## 2014-02-12 DIAGNOSIS — I509 Heart failure, unspecified: Secondary | ICD-10-CM | POA: Diagnosis not present

## 2014-02-14 ENCOUNTER — Other Ambulatory Visit: Payer: Self-pay | Admitting: *Deleted

## 2014-02-14 ENCOUNTER — Encounter (HOSPITAL_COMMUNITY): Payer: Medicaid Other

## 2014-02-14 MED ORDER — CARVEDILOL 3.125 MG PO TABS: 3.1250 mg | ORAL_TABLET | Freq: Two times a day (BID) | ORAL | Status: DC

## 2014-02-14 MED ORDER — ASPIRIN EC 81 MG PO TBEC: 81.0000 mg | DELAYED_RELEASE_TABLET | Freq: Every day | ORAL | Status: DC

## 2014-02-14 MED ORDER — LISINOPRIL 5 MG PO TABS: 5.0000 mg | ORAL_TABLET | Freq: Every day | ORAL | Status: DC

## 2014-02-14 MED ORDER — SIMVASTATIN 40 MG PO TABS: 40.0000 mg | ORAL_TABLET | Freq: Every day | ORAL | Status: DC

## 2014-02-17 ENCOUNTER — Encounter (HOSPITAL_COMMUNITY)
Admission: RE | Admit: 2014-02-17 | Discharge: 2014-02-17 | Disposition: A | Discharge status: Home / Self Care | Payer: Medicaid Other | Source: Ambulatory Visit | Attending: Cardiology | Admitting: Cardiology

## 2014-02-17 ENCOUNTER — Encounter: Payer: Medicaid Other | Admitting: Cardiology

## 2014-02-17 ENCOUNTER — Encounter: Payer: Self-pay | Admitting: Cardiology

## 2014-02-17 DIAGNOSIS — I509 Heart failure, unspecified: Secondary | ICD-10-CM | POA: Diagnosis not present
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
## 2014-02-19 ENCOUNTER — Encounter (HOSPITAL_COMMUNITY)
Admission: RE | Admit: 2014-02-19 | Discharge: 2014-02-19 | Disposition: A | Discharge status: Home / Self Care | Payer: Medicaid Other | Source: Ambulatory Visit | Attending: Cardiology | Admitting: Cardiology

## 2014-02-19 DIAGNOSIS — I509 Heart failure, unspecified: Secondary | ICD-10-CM | POA: Diagnosis not present

## 2014-02-21 ENCOUNTER — Encounter (HOSPITAL_COMMUNITY): Payer: Medicaid Other

## 2014-02-21 ENCOUNTER — Other Ambulatory Visit: Payer: Self-pay

## 2014-02-21 ENCOUNTER — Ambulatory Visit: Payer: Medicaid Other | Admitting: Internal Medicine

## 2014-02-21 MED ORDER — EZETIMIBE 10 MG PO TABS: 10.0000 mg | ORAL_TABLET | Freq: Every day | ORAL | Status: DC

## 2014-02-24 ENCOUNTER — Encounter (HOSPITAL_COMMUNITY)
Admission: RE | Admit: 2014-02-24 | Discharge: 2014-02-24 | Disposition: A | Discharge status: Home / Self Care | Payer: Medicaid Other | Source: Ambulatory Visit | Attending: Cardiology | Admitting: Cardiology

## 2014-02-24 DIAGNOSIS — I509 Heart failure, unspecified: Secondary | ICD-10-CM | POA: Diagnosis not present

## 2014-02-25 ENCOUNTER — Other Ambulatory Visit: Payer: Medicaid Other

## 2014-02-25 ENCOUNTER — Ambulatory Visit: Payer: Medicaid Other | Admitting: Thoracic Surgery (Cardiothoracic Vascular Surgery)

## 2014-02-25 ENCOUNTER — Encounter: Payer: Medicaid Other | Admitting: Cardiology

## 2014-02-26 ENCOUNTER — Encounter (HOSPITAL_COMMUNITY)
Admission: RE | Admit: 2014-02-26 | Discharge: 2014-02-26 | Disposition: A | Discharge status: Home / Self Care | Payer: Medicaid Other | Source: Ambulatory Visit | Attending: Cardiology | Admitting: Cardiology

## 2014-02-26 DIAGNOSIS — I509 Heart failure, unspecified: Secondary | ICD-10-CM | POA: Diagnosis not present

## 2014-02-28 ENCOUNTER — Encounter (HOSPITAL_COMMUNITY)
Admission: RE | Admit: 2014-02-28 | Discharge: 2014-02-28 | Disposition: A | Discharge status: Home / Self Care | Payer: Medicaid Other | Source: Ambulatory Visit | Attending: Cardiology | Admitting: Cardiology

## 2014-02-28 DIAGNOSIS — I509 Heart failure, unspecified: Secondary | ICD-10-CM | POA: Insufficient documentation

## 2014-03-03 ENCOUNTER — Encounter (HOSPITAL_COMMUNITY)
Admission: RE | Admit: 2014-03-03 | Discharge: 2014-03-03 | Disposition: A | Discharge status: Home / Self Care | Payer: Medicaid Other | Source: Ambulatory Visit | Attending: Cardiology | Admitting: Cardiology

## 2014-03-03 DIAGNOSIS — I509 Heart failure, unspecified: Secondary | ICD-10-CM | POA: Diagnosis not present

## 2014-03-04 ENCOUNTER — Ambulatory Visit (INDEPENDENT_AMBULATORY_CARE_PROVIDER_SITE_OTHER): Payer: Medicaid Other | Admitting: Thoracic Surgery (Cardiothoracic Vascular Surgery)

## 2014-03-04 ENCOUNTER — Ambulatory Visit: Payer: Medicaid Other | Admitting: Thoracic Surgery (Cardiothoracic Vascular Surgery)

## 2014-03-04 ENCOUNTER — Encounter: Payer: Self-pay | Admitting: Thoracic Surgery (Cardiothoracic Vascular Surgery)

## 2014-03-04 ENCOUNTER — Ambulatory Visit
Admission: RE | Admit: 2014-03-04 | Discharge: 2014-03-04 | Disposition: A | Discharge status: Home / Self Care | Payer: Medicaid Other | Source: Ambulatory Visit | Attending: Thoracic Surgery (Cardiothoracic Vascular Surgery) | Admitting: Thoracic Surgery (Cardiothoracic Vascular Surgery)

## 2014-03-04 VITALS — BP 103/68 | HR 72

## 2014-03-04 DIAGNOSIS — C3491 Malignant neoplasm of unspecified part of right bronchus or lung: Secondary | ICD-10-CM

## 2014-03-04 DIAGNOSIS — R222 Localized swelling, mass and lump, trunk: Secondary | ICD-10-CM
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
## 2014-03-05 ENCOUNTER — Encounter (HOSPITAL_COMMUNITY)
Admission: RE | Admit: 2014-03-05 | Discharge: 2014-03-05 | Disposition: A | Discharge status: Home / Self Care | Payer: Medicaid Other | Source: Ambulatory Visit | Attending: Cardiology | Admitting: Cardiology

## 2014-03-07 ENCOUNTER — Encounter (HOSPITAL_COMMUNITY)
Admission: RE | Admit: 2014-03-07 | Discharge: 2014-03-07 | Disposition: A | Discharge status: Home / Self Care | Payer: Medicaid Other | Source: Ambulatory Visit | Attending: Cardiology | Admitting: Cardiology

## 2014-03-07 DIAGNOSIS — I509 Heart failure, unspecified: Secondary | ICD-10-CM | POA: Diagnosis not present

## 2014-03-10 ENCOUNTER — Encounter (HOSPITAL_COMMUNITY)
Admission: RE | Admit: 2014-03-10 | Discharge: 2014-03-10 | Disposition: A | Discharge status: Home / Self Care | Payer: Medicaid Other | Source: Ambulatory Visit | Attending: Cardiology | Admitting: Cardiology

## 2014-03-10 DIAGNOSIS — I509 Heart failure, unspecified: Secondary | ICD-10-CM | POA: Diagnosis not present

## 2014-03-12 ENCOUNTER — Encounter (HOSPITAL_COMMUNITY)
Admission: RE | Admit: 2014-03-12 | Discharge: 2014-03-12 | Disposition: A | Discharge status: Home / Self Care | Payer: Medicaid Other | Source: Ambulatory Visit | Attending: Cardiology | Admitting: Cardiology

## 2014-03-12 DIAGNOSIS — I509 Heart failure, unspecified: Secondary | ICD-10-CM | POA: Diagnosis not present

## 2014-03-14 ENCOUNTER — Encounter (HOSPITAL_COMMUNITY)
Admission: RE | Admit: 2014-03-14 | Discharge: 2014-03-14 | Disposition: A | Discharge status: Home / Self Care | Payer: Medicaid Other | Source: Ambulatory Visit | Attending: Cardiology | Admitting: Cardiology

## 2014-03-14 DIAGNOSIS — I509 Heart failure, unspecified: Secondary | ICD-10-CM | POA: Diagnosis not present

## 2014-03-14 NOTE — Progress Notes (Signed)
Pt graduates today from cardiac rehab Phase II program with the completion of 33 exercise sessions due to the limitation of 12 weeks per .  Pt maintained excellent attendance and only missed 3 sessions.  Pt attended all the education classes offered and demonstrates appropriate understanding of CAD and CHF. Pt should be commended for the excellent progress he made mentally and socially.  Pt is much more open and feels positive about his future. Pt is hopeful that his financial outlook will change in the near future.  Pt feels supportive by his family and gives them the credit for helping him when he is short of money.  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  We feel he would be an asset to our program. Medication list reconciled.  Repeat PHQ2 score 1.  Pt admits he struggles some days but feels this is less and less.  Pt short term goal was to increase stamina .  Although he is not back to his baseline he "certainly not where I was"  Pt feels his recovery time from strenuous activity is shorter than before.  Pt also desires to get back to normal. Pt does not feel he is there but remarks that he is "getting there".  It was indeed a pleasure to work with this patient and we are looking forward to working with him in the volunteer capacity. Maurice Small RN

## 2014-03-18 ENCOUNTER — Other Ambulatory Visit: Payer: Self-pay

## 2014-03-18 MED ORDER — CARVEDILOL 3.125 MG PO TABS: 3.1250 mg | ORAL_TABLET | Freq: Two times a day (BID) | ORAL | Status: DC

## 2014-03-18 MED ORDER — LISINOPRIL 5 MG PO TABS: 5.0000 mg | ORAL_TABLET | Freq: Every day | ORAL | Status: DC

## 2014-03-18 MED ORDER — SIMVASTATIN 40 MG PO TABS: 40.0000 mg | ORAL_TABLET | Freq: Every day | ORAL | Status: DC

## 2014-03-18 MED ORDER — ASPIRIN EC 81 MG PO TBEC: 81.0000 mg | DELAYED_RELEASE_TABLET | Freq: Every day | ORAL | Status: DC

## 2014-03-20 ENCOUNTER — Ambulatory Visit (INDEPENDENT_AMBULATORY_CARE_PROVIDER_SITE_OTHER): Payer: Medicaid Other | Admitting: Internal Medicine

## 2014-03-20 ENCOUNTER — Encounter (INDEPENDENT_AMBULATORY_CARE_PROVIDER_SITE_OTHER): Payer: Self-pay

## 2014-03-20 ENCOUNTER — Encounter: Payer: Self-pay | Admitting: Internal Medicine

## 2014-03-20 VITALS — BP 112/72 | HR 80 | Ht 67.5 in | Wt 127.0 lb

## 2014-03-20 DIAGNOSIS — Z720 Tobacco use: Secondary | ICD-10-CM

## 2014-03-20 DIAGNOSIS — I2510 Atherosclerotic heart disease of native coronary artery without angina pectoris: Secondary | ICD-10-CM

## 2014-03-20 DIAGNOSIS — J438 Other emphysema: Secondary | ICD-10-CM | POA: Insufficient documentation

## 2014-03-20 DIAGNOSIS — J479 Bronchiectasis, uncomplicated: Secondary | ICD-10-CM

## 2014-03-20 DIAGNOSIS — F17200 Nicotine dependence, unspecified, uncomplicated: Secondary | ICD-10-CM

## 2014-03-20 DIAGNOSIS — C3490 Malignant neoplasm of unspecified part of unspecified bronchus or lung: Secondary | ICD-10-CM

## 2014-03-20 DIAGNOSIS — J439 Emphysema, unspecified: Secondary | ICD-10-CM

## 2014-03-20 LAB — PULMONARY FUNCTION TEST
DL/VA % pred: 43 %
DL/VA: 1.96 ml/min/mmHg/L
DLCO UNC % PRED: 27 %
DLCO UNC: 7.91 ml/min/mmHg
FEF 25-75 PRE: 1.24 L/s
FEF 25-75 Post: 1.23 L/sec
FEF2575-%Change-Post: 0 %
FEF2575-%PRED-POST: 40 %
FEF2575-%Pred-Pre: 41 %
FEV1-%Change-Post: -1 %
FEV1-%Pred-Post: 64 %
FEV1-%Pred-Pre: 65 %
FEV1-PRE: 1.98 L
FEV1-Post: 1.96 L
FEV1FVC-%Change-Post: 1 %
FEV1FVC-%Pred-Pre: 84 %
FEV6-%Change-Post: -2 %
FEV6-%PRED-POST: 77 %
FEV6-%Pred-Pre: 79 %
FEV6-POST: 2.85 L
FEV6-PRE: 2.92 L
FEV6FVC-%Change-Post: 0 %
FEV6FVC-%PRED-POST: 103 %
FEV6FVC-%Pred-Pre: 102 %
FVC-%Change-Post: -2 %
FVC-%PRED-PRE: 77 %
FVC-%Pred-Post: 75 %
FVC-POST: 2.85 L
FVC-Pre: 2.93 L
POST FEV6/FVC RATIO: 100 %
PRE FEV6/FVC RATIO: 100 %
Post FEV1/FVC ratio: 69 %
Pre FEV1/FVC ratio: 68 %
RV % PRED: 111 %
RV: 2.17 L
TLC % PRED: 73 %
TLC: 4.76 L
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
## 2014-03-20 NOTE — Progress Notes (Signed)
PFT done today. 

## 2014-03-20 NOTE — Patient Instructions (Addendum)
#  Smoking  - too bad you replased. Please quit   #COPD - stable  - glad cardiac rehab helped - continue spiriva as before - add advair 100/50, 1 puff twice daily - ensure flu shot through Marlou Sa, [name], MD   #cancer surveillance  - through Dr Roxan Hockey; appears in remission as of Oct 2015  #Followup  -return to seee my NP Tammy PArrett in 6 months
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
## 2014-04-01 DIAGNOSIS — I10 Essential (primary) hypertension: Secondary | ICD-10-CM | POA: Diagnosis not present

## 2014-04-01 DIAGNOSIS — J441 Chronic obstructive pulmonary disease with (acute) exacerbation: Secondary | ICD-10-CM | POA: Diagnosis not present

## 2014-04-01 DIAGNOSIS — E782 Mixed hyperlipidemia: Secondary | ICD-10-CM | POA: Diagnosis not present

## 2014-04-01 DIAGNOSIS — C3490 Malignant neoplasm of unspecified part of unspecified bronchus or lung: Secondary | ICD-10-CM | POA: Diagnosis not present

## 2014-04-01 DIAGNOSIS — Z23 Encounter for immunization: Secondary | ICD-10-CM | POA: Diagnosis not present

## 2014-04-03 ENCOUNTER — Encounter: Payer: Medicaid Other | Admitting: Cardiology

## 2014-04-21 ENCOUNTER — Encounter: Payer: Self-pay | Admitting: *Deleted

## 2014-04-21 NOTE — [facility] Oncology Navigator Note (Signed)
Called patient to check in .  Left voice mail message with contact information and request to return call.

## 2014-04-22 ENCOUNTER — Encounter: Payer: Self-pay | Admitting: *Deleted

## 2014-04-22 NOTE — [facility] Oncology Navigator Note (Signed)
Patient returned my call.  He reports that he is doing well.  His only concern is that he has started smoking again and is smoking about 1/2 pack per day.  We discussed how hard it is to stop and about smoking cigarettes and one strategy is to smoke brands with less nicotine and setting a stop date.  He agreed that it would be helpful if I mail him information about smoking cessation and classes.  He denied any other questions or concerns at this time.  I encouraged him to call me for any needs he may have.

## 2014-04-22 NOTE — [facility] Oncology Navigator Note (Signed)
Called patient to check in.  Left voicemail message with contact information.

## 2014-04-23 ENCOUNTER — Other Ambulatory Visit: Payer: Self-pay | Admitting: *Deleted

## 2014-04-23 MED ORDER — ASPIRIN EC 81 MG PO TBEC: 81.0000 mg | DELAYED_RELEASE_TABLET | Freq: Every day | ORAL | Status: DC

## 2014-04-23 MED ORDER — SIMVASTATIN 40 MG PO TABS: 40.0000 mg | ORAL_TABLET | Freq: Every day | ORAL | Status: DC

## 2014-04-23 MED ORDER — CARVEDILOL 3.125 MG PO TABS: 3.1250 mg | ORAL_TABLET | Freq: Two times a day (BID) | ORAL | Status: DC

## 2014-04-25 ENCOUNTER — Other Ambulatory Visit: Payer: Self-pay

## 2014-04-25 MED ORDER — LISINOPRIL 5 MG PO TABS: 5.0000 mg | ORAL_TABLET | Freq: Every day | ORAL | Status: DC

## 2014-04-29 ENCOUNTER — Encounter: Payer: Self-pay | Admitting: *Deleted

## 2014-04-29 NOTE — [facility] Oncology Navigator Note (Signed)
Mailed patient contact information and information on smoking cessation classes.

## 2014-05-05 ENCOUNTER — Ambulatory Visit (INDEPENDENT_AMBULATORY_CARE_PROVIDER_SITE_OTHER): Payer: Medicare Other | Admitting: *Deleted

## 2014-05-05 ENCOUNTER — Telehealth: Payer: Self-pay | Admitting: Cardiology

## 2014-05-05 DIAGNOSIS — I5022 Chronic systolic (congestive) heart failure: Secondary | ICD-10-CM

## 2014-05-05 DIAGNOSIS — I255 Ischemic cardiomyopathy: Secondary | ICD-10-CM

## 2014-05-05 NOTE — Telephone Encounter (Signed)
LMOVM reminding pt to send remote transmission.   

## 2014-05-06 ENCOUNTER — Encounter: Payer: Self-pay | Admitting: Cardiology

## 2014-05-07 ENCOUNTER — Encounter: Payer: Self-pay | Admitting: Internal Medicine

## 2014-05-07 DIAGNOSIS — I255 Ischemic cardiomyopathy: Secondary | ICD-10-CM | POA: Diagnosis not present

## 2014-05-07 DIAGNOSIS — I5022 Chronic systolic (congestive) heart failure: Secondary | ICD-10-CM | POA: Diagnosis not present

## 2014-05-07 NOTE — Progress Notes (Signed)
Remote ICD transmission.   

## 2014-05-08 ENCOUNTER — Encounter (HOSPITAL_COMMUNITY): Payer: Self-pay | Admitting: Internal Medicine

## 2014-05-09 ENCOUNTER — Other Ambulatory Visit: Payer: Self-pay | Admitting: *Deleted

## 2014-05-09 DIAGNOSIS — R918 Other nonspecific abnormal finding of lung field: Secondary | ICD-10-CM

## 2014-05-10 LAB — MDC_IDC_ENUM_SESS_TYPE_REMOTE
Battery Remaining Longevity: 90 mo
Brady Statistic RV Percent Paced: 1 %
HIGH POWER IMPEDANCE MEASURED VALUE: 60 Ohm
HighPow Impedance: 60 Ohm
Lead Channel Impedance Value: 360 Ohm
Lead Channel Setting Pacing Pulse Width: 0.5 ms
MDC IDC MSMT BATTERY REMAINING PERCENTAGE: 89 %
MDC IDC MSMT BATTERY VOLTAGE: 3.1 V
MDC IDC MSMT LEADCHNL RV SENSING INTR AMPL: 11.3 mV
MDC IDC PG SERIAL: 7132440
MDC IDC SESS DTM: 20151209191217
MDC IDC SET LEADCHNL RV PACING AMPLITUDE: 2.5 V
MDC IDC SET LEADCHNL RV SENSING SENSITIVITY: 0.5 mV
Zone Setting Detection Interval: 250 ms
Zone Setting Detection Interval: 300 ms

## 2014-05-22 ENCOUNTER — Other Ambulatory Visit: Payer: Self-pay | Admitting: Internal Medicine

## 2014-05-22 MED ORDER — EZETIMIBE 10 MG PO TABS: 10.0000 mg | ORAL_TABLET | Freq: Every day | ORAL | Status: DC

## 2014-05-28 ENCOUNTER — Telehealth: Payer: Self-pay | Admitting: *Deleted

## 2014-05-28 NOTE — Telephone Encounter (Signed)
Called to follow up with [name] regarding smoking cessation.  I left a vm message to call with my name and phone number

## 2014-06-05 ENCOUNTER — Encounter: Payer: Self-pay | Admitting: Cardiology

## 2014-06-13 ENCOUNTER — Telehealth: Payer: Self-pay | Admitting: *Deleted

## 2014-06-13 NOTE — Telephone Encounter (Signed)
Called left a vm message to called left vm message to call regarding smoking cessation.  I left my name and phone number
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
## 2014-06-16 ENCOUNTER — Encounter: Payer: Medicaid Other | Admitting: Cardiology

## 2014-06-17 ENCOUNTER — Inpatient Hospital Stay: Admission: RE | Admit: 2014-06-17 | Payer: Medicaid Other | Source: Ambulatory Visit

## 2014-06-17 ENCOUNTER — Ambulatory Visit: Payer: Medicare Other | Admitting: Thoracic Surgery (Cardiothoracic Vascular Surgery)

## 2014-06-19 ENCOUNTER — Telehealth: Payer: Self-pay | Admitting: *Deleted

## 2014-07-02 ENCOUNTER — Other Ambulatory Visit: Payer: Medicare Other

## 2014-07-02 ENCOUNTER — Encounter: Payer: Self-pay | Admitting: *Deleted

## 2014-07-02 ENCOUNTER — Ambulatory Visit: Payer: Medicare Other | Admitting: Thoracic Surgery (Cardiothoracic Vascular Surgery)

## 2014-07-02 NOTE — [facility] Oncology Navigator Note (Signed)
Called patient and left voice mail with contact information to follow up on missed appointment.

## 2014-07-03 ENCOUNTER — Telehealth: Payer: Self-pay | Admitting: *Deleted

## 2014-07-15 ENCOUNTER — Ambulatory Visit (INDEPENDENT_AMBULATORY_CARE_PROVIDER_SITE_OTHER): Payer: Medicare Other | Admitting: Thoracic Surgery (Cardiothoracic Vascular Surgery)

## 2014-07-15 ENCOUNTER — Encounter: Payer: Self-pay | Admitting: Thoracic Surgery (Cardiothoracic Vascular Surgery)

## 2014-07-15 ENCOUNTER — Telehealth: Payer: Self-pay | Admitting: *Deleted

## 2014-07-15 ENCOUNTER — Ambulatory Visit: Payer: Medicare Other

## 2014-07-15 ENCOUNTER — Ambulatory Visit
Admission: RE | Admit: 2014-07-15 | Discharge: 2014-07-15 | Disposition: A | Discharge status: Home / Self Care | Payer: Medicare Other | Source: Ambulatory Visit | Attending: Thoracic Surgery (Cardiothoracic Vascular Surgery) | Admitting: Thoracic Surgery (Cardiothoracic Vascular Surgery)

## 2014-07-15 VITALS — BP 109/75 | HR 100 | Resp 20 | Ht 67.5 in | Wt 125.0 lb

## 2014-07-15 DIAGNOSIS — R599 Enlarged lymph nodes, unspecified: Secondary | ICD-10-CM | POA: Diagnosis not present

## 2014-07-15 DIAGNOSIS — Z85118 Personal history of other malignant neoplasm of bronchus and lung: Secondary | ICD-10-CM | POA: Diagnosis not present

## 2014-07-15 DIAGNOSIS — R918 Other nonspecific abnormal finding of lung field: Secondary | ICD-10-CM

## 2014-07-15 DIAGNOSIS — R590 Localized enlarged lymph nodes: Secondary | ICD-10-CM

## 2014-07-15 MED ORDER — NICOTINE 14 MG/24HR TD PT24: 14.0000 mg | MEDICATED_PATCH | Freq: Every day | TRANSDERMAL | Status: DC

## 2014-07-15 MED ORDER — NICOTINE 7 MG/24HR TD PT24: 7.0000 mg | MEDICATED_PATCH | Freq: Every day | TRANSDERMAL | Status: DC

## 2014-07-15 NOTE — Telephone Encounter (Signed)
I called patient to check in and remind him of his appointments.  He reports that he missed his last appointment due to the death of his sister.  He says that is doing well.  He received the information I sent him on smoking cessation but reports that the amount has decreased but he continues to smoke. We discussed setting a target date to stop and he said he does that every day.  He is going to discuss using patches with Dr. Roxan Hockey at his appointment today.  He denied any questions or concerns at this time.  I encouraged him to call me for any needs.
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
## 2014-07-21 ENCOUNTER — Other Ambulatory Visit: Payer: Self-pay | Admitting: Internal Medicine

## 2014-08-07 ENCOUNTER — Telehealth: Payer: Self-pay | Admitting: Cardiology

## 2014-08-07 ENCOUNTER — Encounter: Payer: Medicare Other | Admitting: *Deleted

## 2014-08-07 NOTE — Telephone Encounter (Signed)
LMOVM reminding pt to send remote transmission.   

## 2014-08-08 ENCOUNTER — Encounter: Payer: Self-pay | Admitting: Cardiology

## 2014-08-20 ENCOUNTER — Other Ambulatory Visit: Payer: Self-pay

## 2014-08-20 MED ORDER — EZETIMIBE 10 MG PO TABS: 10.0000 mg | ORAL_TABLET | Freq: Every day | ORAL | Status: DC
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
## 2014-09-01 ENCOUNTER — Encounter: Payer: Medicaid Other | Admitting: Cardiology

## 2014-09-03 DIAGNOSIS — M21372 Foot drop, left foot: Secondary | ICD-10-CM | POA: Diagnosis not present

## 2014-09-03 DIAGNOSIS — J441 Chronic obstructive pulmonary disease with (acute) exacerbation: Secondary | ICD-10-CM | POA: Diagnosis not present

## 2014-09-03 DIAGNOSIS — C3490 Malignant neoplasm of unspecified part of unspecified bronchus or lung: Secondary | ICD-10-CM | POA: Diagnosis not present

## 2014-09-03 DIAGNOSIS — A64 Unspecified sexually transmitted disease: Secondary | ICD-10-CM | POA: Diagnosis not present

## 2014-09-03 DIAGNOSIS — E782 Mixed hyperlipidemia: Secondary | ICD-10-CM | POA: Diagnosis not present

## 2014-09-12 ENCOUNTER — Encounter: Payer: Self-pay | Admitting: *Deleted

## 2014-09-15 ENCOUNTER — Encounter: Payer: Self-pay | Admitting: Neurology

## 2014-09-15 ENCOUNTER — Ambulatory Visit (INDEPENDENT_AMBULATORY_CARE_PROVIDER_SITE_OTHER): Payer: Medicare Other | Admitting: Neurology

## 2014-09-15 VITALS — BP 121/72 | HR 66 | Temp 98.2°F | Ht 68.0 in | Wt 128.5 lb

## 2014-09-15 DIAGNOSIS — G5730 Lesion of lateral popliteal nerve, unspecified lower limb: Secondary | ICD-10-CM | POA: Insufficient documentation

## 2014-09-15 DIAGNOSIS — R531 Weakness: Secondary | ICD-10-CM | POA: Diagnosis not present

## 2014-09-15 DIAGNOSIS — M21371 Foot drop, right foot: Secondary | ICD-10-CM

## 2014-09-15 DIAGNOSIS — S82401A Unspecified fracture of shaft of right fibula, initial encounter for closed fracture: Secondary | ICD-10-CM

## 2014-09-15 DIAGNOSIS — G5731 Lesion of lateral popliteal nerve, right lower limb: Secondary | ICD-10-CM | POA: Diagnosis not present

## 2014-09-15 DIAGNOSIS — R5383 Other fatigue: Secondary | ICD-10-CM | POA: Diagnosis not present

## 2014-09-15 NOTE — Patient Instructions (Addendum)
Overall you are doing fairly well but I do want to suggest a few things today:   Remember to drink plenty of fluid, eat healthy meals and do not skip any meals. Try to eat protein with a every meal and eat a healthy snack such as fruit or nuts in between meals. Try to keep a regular sleep-wake schedule and try to exercise daily, particularly in the form of walking, 20-30 minutes a day, if you can.   As far as your therapy is concerned, I would like to suggest: foot orthosis, Physical Therapy  As far as diagnostic testing: xray at [address] [facility], labwork  I would like to see you back in one week for emg/ncs, sooner if we need to. Please call us with any interim questions, concerns, problems, updates or refill requests.   Please also call us for any test results so we can go over those with you on the phone.  My clinical assistant and will answer any of your questions and relay your messages to me and also relay most of my messages to you.   Our phone number is [phone]. We also have an after hours call service for urgent matters and there is a physician on-call for urgent questions. For any emergencies you know to call 911 or go to the nearest emergency room
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
## 2014-09-16 ENCOUNTER — Other Ambulatory Visit: Payer: Self-pay | Admitting: *Deleted

## 2014-09-16 ENCOUNTER — Telehealth: Payer: Self-pay | Admitting: *Deleted

## 2014-09-16 DIAGNOSIS — R590 Localized enlarged lymph nodes: Secondary | ICD-10-CM

## 2014-09-16 DIAGNOSIS — R918 Other nonspecific abnormal finding of lung field: Secondary | ICD-10-CM

## 2014-09-16 LAB — CBC
HCT: 41.5 % (ref 37.5–51.0)
Hemoglobin: 13.9 g/dL (ref 12.6–17.7)
MCH: 29.2 pg (ref 26.6–33.0)
MCHC: 33.5 g/dL (ref 31.5–35.7)
MCV: 87 fL (ref 79–97)
PLATELETS: 360 10*3/uL (ref 150–379)
RBC: 4.76 x10E6/uL (ref 4.14–5.80)
RDW: 15.2 % (ref 12.3–15.4)
WBC: 8.9 10*3/uL (ref 3.4–10.8)

## 2014-09-16 NOTE — Telephone Encounter (Signed)
Talked with patient about normal lab results. Patient verbalized understanding.

## 2014-09-19 ENCOUNTER — Ambulatory Visit (INDEPENDENT_AMBULATORY_CARE_PROVIDER_SITE_OTHER): Payer: Medicare Other | Admitting: Adult Health

## 2014-09-19 ENCOUNTER — Encounter: Payer: Self-pay | Admitting: Adult Health

## 2014-09-19 VITALS — BP 108/76 | HR 94 | Temp 98.3°F | Ht 68.0 in | Wt 120.5 lb

## 2014-09-19 DIAGNOSIS — F17200 Nicotine dependence, unspecified, uncomplicated: Secondary | ICD-10-CM

## 2014-09-19 DIAGNOSIS — J439 Emphysema, unspecified: Secondary | ICD-10-CM | POA: Diagnosis not present

## 2014-09-19 DIAGNOSIS — Z720 Tobacco use: Secondary | ICD-10-CM

## 2014-09-19 DIAGNOSIS — C3490 Malignant neoplasm of unspecified part of unspecified bronchus or lung: Secondary | ICD-10-CM
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
## 2014-09-19 NOTE — Assessment & Plan Note (Signed)
Lung cancer s/p wedge resection in 2015  Most recent CT chest was stable   Plan  Continue to work on not smoking .  Follow up with CT chest next month as planned  Follow up Dr. Chase Caller in 6 months and As needed

## 2014-09-19 NOTE — Assessment & Plan Note (Signed)
Continue to work on not smoking .  Follow up with CT chest next month as planned  Follow up Dr. Chase Caller in 6 months and As needed

## 2014-09-19 NOTE — Assessment & Plan Note (Signed)
COPD stable   Plan  Continue Spiriva and Advair .  Continue to work on not smoking .  Follow up with CT chest next month as planned  Follow up Dr. Chase Caller in 6 months and As needed

## 2014-09-19 NOTE — Patient Instructions (Signed)
Continue Spiriva and Advair .  Continue to work on not smoking .  Follow up with CT chest next month as planned  Follow up Dr. Chase Caller in 6 months and As needed

## 2014-09-24 ENCOUNTER — Telehealth: Payer: Self-pay | Admitting: Neurology

## 2014-09-24 NOTE — Telephone Encounter (Signed)
Talked with Anderson Malta from [place] and I asked her if she received our fax from 09-16-14 for order for foot brace and gave her [phone]. She stated this was the [place] site and information needed to be sent to [place]. She said to re-fax to [phone]. I sent fax with order, demographics page, and insurance information at 4:38pm ATTN to [facility] [facility] and received fax confirmation.

## 2014-09-24 NOTE — Telephone Encounter (Signed)
We already faxed it over, would you please fax it again? Thanks.

## 2014-09-24 NOTE — Telephone Encounter (Signed)
Anderson Malta with [employer] is calling to get an order for a foot brace for the patient with office notes. Please fax to [phone]. Thank you.

## 2014-09-25 ENCOUNTER — Encounter: Payer: Self-pay | Admitting: Cardiology

## 2014-09-25 ENCOUNTER — Ambulatory Visit (INDEPENDENT_AMBULATORY_CARE_PROVIDER_SITE_OTHER): Payer: Medicare Other | Admitting: Cardiology

## 2014-09-25 ENCOUNTER — Ambulatory Visit (INDEPENDENT_AMBULATORY_CARE_PROVIDER_SITE_OTHER): Payer: Self-pay | Admitting: Neurology

## 2014-09-25 ENCOUNTER — Ambulatory Visit (INDEPENDENT_AMBULATORY_CARE_PROVIDER_SITE_OTHER): Payer: Medicare Other | Admitting: Neurology

## 2014-09-25 ENCOUNTER — Ambulatory Visit (INDEPENDENT_AMBULATORY_CARE_PROVIDER_SITE_OTHER): Payer: Medicare Other | Admitting: *Deleted

## 2014-09-25 ENCOUNTER — Other Ambulatory Visit: Payer: Self-pay

## 2014-09-25 VITALS — BP 104/82 | HR 100 | Ht 68.0 in | Wt 120.0 lb

## 2014-09-25 DIAGNOSIS — I2510 Atherosclerotic heart disease of native coronary artery without angina pectoris: Secondary | ICD-10-CM

## 2014-09-25 DIAGNOSIS — G5731 Lesion of lateral popliteal nerve, right lower limb: Secondary | ICD-10-CM

## 2014-09-25 DIAGNOSIS — I5022 Chronic systolic (congestive) heart failure: Secondary | ICD-10-CM

## 2014-09-25 DIAGNOSIS — M21371 Foot drop, right foot: Secondary | ICD-10-CM

## 2014-09-25 DIAGNOSIS — E785 Hyperlipidemia, unspecified: Secondary | ICD-10-CM

## 2014-09-25 DIAGNOSIS — R911 Solitary pulmonary nodule: Secondary | ICD-10-CM

## 2014-09-25 DIAGNOSIS — Z79899 Other long term (current) drug therapy: Secondary | ICD-10-CM | POA: Diagnosis not present

## 2014-09-25 DIAGNOSIS — Z0289 Encounter for other administrative examinations: Secondary | ICD-10-CM

## 2014-09-25 DIAGNOSIS — I255 Ischemic cardiomyopathy: Secondary | ICD-10-CM | POA: Diagnosis not present

## 2014-09-25 MED ORDER — LISINOPRIL 5 MG PO TABS: 5.0000 mg | ORAL_TABLET | Freq: Every day | ORAL | Status: DC
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
## 2014-09-25 NOTE — Patient Instructions (Addendum)
Your physician recommends that you return for lab work fasting.  Your physician recommends that you schedule a follow-up appointment in: September with Dr. Caryl Comes  Your physician recommends that you schedule a follow-up appointment in: 4 months with Dr  Stanford Breed.

## 2014-09-26 LAB — MDC_IDC_ENUM_SESS_TYPE_INCLINIC
Battery Remaining Percentage: 86 %
Brady Statistic RV Percent Paced: 1 % — CL
HIGH POWER IMPEDANCE MEASURED VALUE: 60 Ohm
Lead Channel Pacing Threshold Amplitude: 0.75 V
Lead Channel Pacing Threshold Pulse Width: 0.5 ms
Lead Channel Setting Pacing Amplitude: 2.5 V
Lead Channel Setting Pacing Pulse Width: 0.5 ms
Lead Channel Setting Sensing Sensitivity: 0.5 mV
MDC IDC MSMT LEADCHNL RV IMPEDANCE VALUE: 350 Ohm
MDC IDC MSMT LEADCHNL RV SENSING INTR AMPL: 11.3 mV
MDC IDC PG SERIAL: 7132440
Zone Setting Detection Interval: 250 ms
Zone Setting Detection Interval: 300 ms

## 2014-09-26 NOTE — Progress Notes (Signed)
ICD check in clinic w/Laura Dorene Ar, NP. Normal device function. Threshold and sensing consistent with previous device measurements. Impedance trends stable over time. No evidence of any ventricular arrhythmias. Histogram distribution appropriate for patient and level of activity.Thoracic impedance WNL at present---last abn x 10 days in early April, x 11 days in mid March, x 9 days in late Feb. No changes made this session. Device programmed at appropriate safety margins. Device programmed to optimize intrinsic conduction. Estimated longevity 7.2 years. Pt enrolled in remote follow-up. Plan to follow up via [facility] on 7/28 and with SK in 01-2015. Patient education completed including shock plan. Vibration demonstrated for patient.

## 2014-09-29 ENCOUNTER — Ambulatory Visit
Admission: RE | Admit: 2014-09-29 | Discharge: 2014-09-29 | Disposition: A | Discharge status: Home / Self Care | Payer: Medicare Other | Source: Ambulatory Visit | Attending: Neurology | Admitting: Neurology

## 2014-09-29 ENCOUNTER — Telehealth: Payer: Self-pay | Admitting: *Deleted

## 2014-09-29 DIAGNOSIS — M21371 Foot drop, right foot: Secondary | ICD-10-CM | POA: Diagnosis not present

## 2014-09-29 DIAGNOSIS — S82401A Unspecified fracture of shaft of right fibula, initial encounter for closed fracture: Secondary | ICD-10-CM

## 2014-09-29 DIAGNOSIS — G5731 Lesion of lateral popliteal nerve, right lower limb: Secondary | ICD-10-CM

## 2014-09-29 DIAGNOSIS — R531 Weakness: Secondary | ICD-10-CM

## 2014-09-29 DIAGNOSIS — E785 Hyperlipidemia, unspecified: Secondary | ICD-10-CM | POA: Diagnosis not present

## 2014-09-29 DIAGNOSIS — Z79899 Other long term (current) drug therapy: Secondary | ICD-10-CM | POA: Diagnosis not present

## 2014-09-29 NOTE — Progress Notes (Signed)
  [facility]    Provider:  Dr Jaynee Eagles Referring Provider: Lucianne Lei, MD Primary Care Physician:  Elyn Peers, MD   HPI:  [name] is a 54 y.o. male here for evaluation of right foot drop. Foot drop happened acutely approx 2 months ago. Just one day he noticed it. He tried to raise his foot up and it wouldn't move. One morning getting out of bed. No weight loss. He fell asleep on the couch that night. Hasn't gotten any better since then.  No assymmetry or loss of mass in that leg. No back pain. He has tightness on the lateral side of the right knee. He also has numbness on the lateral side of the lower right leg.   Nerve conduction study was performed on the right lower extremity:  The right peroneal motor nerve showed conduction block (>50% decrease in amplitude with proximal conduction as compared to the distal amplitudes).   The right Tibial motor nerve was within normal limits The right Sural sensory nerve was within normal limits The right Superficial Peroneal sensory nerve showed no response. The right H-Reflex showed normal latency.  EMG needle study was attempted however patient could not tolerate. The right Vastus Medialis was attempted. No spontaneous activity was noted.  Conclusion: Limited study, could not complete EMG needle study. There is electrophysiologic support for a right peroneal neuropathy likely at the fibular head.   Sarina Ill, MD  [facility] [address] [place], [place] [address]  Phone [phone] Fax [phone]

## 2014-09-29 NOTE — Addendum Note (Signed)
Addended by: Vear Clock on: 09/29/2014 03:51 PM   Modules accepted: Orders

## 2014-09-29 NOTE — Telephone Encounter (Signed)
Talked with patient to let him know xray did not show any evidence of a fracture or other focal bone lesions. The soft tissues are unremarkable. Told him to call back if he has any more questions.  Pt verbalized understanding.

## 2014-09-29 NOTE — Telephone Encounter (Signed)
Left message for patient to call us back. I was calling about lab results. Gave [facility] phone number and office hours.

## 2014-09-29 NOTE — Procedures (Signed)
[facility]    Provider:  Dr Jaynee Eagles Referring Provider: Lucianne Lei, MD Primary Care Physician:  Elyn Peers, MD   HPI:  [name] is a 54 y.o. male here for evaluation of right foot drop. Foot drop happened acutely approx 2 months ago. Just one day he noticed it. He tried to raise his foot up and it wouldn't move. One morning getting out of bed. No weight loss. He fell asleep on the couch that night. Hasn't gotten any better since then.  No assymmetry or loss of mass in that leg. No back pain. He has tightness on the lateral side of the right knee. He also has numbness on the lateral side of the lower right leg.   Nerve conduction study was performed on the right lower extremity:  The right peroneal motor nerve showed conduction block (>50% decrease in amplitude with proximal conduction as compared to the distal amplitudes).   The right Tibial motor nerve was within normal limits The right Sural sensory nerve was within normal limits The right Superficial Peroneal sensory nerve showed no response. The right H-Reflex showed normal latency.  EMG needle study was attempted however patient could not tolerate. The right Vastus Medialis was attempted. No spontaneous activity was noted.  Conclusion: Limited study, could not complete EMG needle study. There is electrophysiologic support for a right peroneal neuropathy likely at the fibular head.   Sarina Ill, MD  [facility] [address] [place], [place] [address]  Phone [phone] Fax [phone]

## 2014-09-29 NOTE — Progress Notes (Signed)
See procedure note.

## 2014-09-30 LAB — COMPREHENSIVE METABOLIC PANEL
ALT: 19 U/L (ref 0–53)
AST: 20 U/L (ref 0–37)
Albumin: 3.4 g/dL — ABNORMAL LOW (ref 3.5–5.2)
Alkaline Phosphatase: 66 U/L (ref 39–117)
BILIRUBIN TOTAL: 0.4 mg/dL (ref 0.2–1.2)
BUN: 8 mg/dL (ref 6–23)
CHLORIDE: 103 meq/L (ref 96–112)
CO2: 28 mEq/L (ref 19–32)
CREATININE: 0.71 mg/dL (ref 0.50–1.35)
Calcium: 9.1 mg/dL (ref 8.4–10.5)
GLUCOSE: 88 mg/dL (ref 70–99)
Potassium: 4.9 mEq/L (ref 3.5–5.3)
Sodium: 137 mEq/L (ref 135–145)
Total Protein: 8.3 g/dL (ref 6.0–8.3)

## 2014-09-30 LAB — LIPID PANEL
Cholesterol: 107 mg/dL (ref 0–200)
HDL: 39 mg/dL — ABNORMAL LOW (ref >=40)
LDL Cholesterol: 59 mg/dL (ref 0–99)
TRIGLYCERIDES: 47 mg/dL (ref <150)
Total CHOL/HDL Ratio: 2.7 Ratio
VLDL: 9 mg/dL (ref 0–40)

## 2014-10-01 ENCOUNTER — Encounter: Payer: Self-pay | Admitting: *Deleted

## 2014-10-02 ENCOUNTER — Encounter: Payer: Self-pay | Admitting: *Deleted

## 2014-10-14 ENCOUNTER — Encounter: Payer: Self-pay | Admitting: Thoracic Surgery (Cardiothoracic Vascular Surgery)

## 2014-10-14 ENCOUNTER — Ambulatory Visit (INDEPENDENT_AMBULATORY_CARE_PROVIDER_SITE_OTHER): Payer: Self-pay | Admitting: Thoracic Surgery (Cardiothoracic Vascular Surgery)

## 2014-10-14 ENCOUNTER — Ambulatory Visit
Admission: RE | Admit: 2014-10-14 | Discharge: 2014-10-14 | Disposition: A | Discharge status: Home / Self Care | Payer: Medicare Other | Source: Ambulatory Visit | Attending: Thoracic Surgery (Cardiothoracic Vascular Surgery) | Admitting: Thoracic Surgery (Cardiothoracic Vascular Surgery)

## 2014-10-14 ENCOUNTER — Telehealth: Payer: Self-pay | Admitting: *Deleted

## 2014-10-14 VITALS — BP 114/73 | HR 94 | Resp 16 | Ht 68.0 in | Wt 120.0 lb

## 2014-10-14 DIAGNOSIS — R590 Localized enlarged lymph nodes: Secondary | ICD-10-CM | POA: Diagnosis not present

## 2014-10-14 DIAGNOSIS — J432 Centrilobular emphysema: Secondary | ICD-10-CM | POA: Diagnosis not present

## 2014-10-14 DIAGNOSIS — Z951 Presence of aortocoronary bypass graft: Secondary | ICD-10-CM | POA: Diagnosis not present

## 2014-10-14 DIAGNOSIS — R918 Other nonspecific abnormal finding of lung field: Secondary | ICD-10-CM

## 2014-10-14 DIAGNOSIS — C3491 Malignant neoplasm of unspecified part of right bronchus or lung: Secondary | ICD-10-CM

## 2014-10-14 DIAGNOSIS — Z85118 Personal history of other malignant neoplasm of bronchus and lung: Secondary | ICD-10-CM | POA: Diagnosis not present

## 2014-10-14 NOTE — Telephone Encounter (Signed)
Oncology Nurse Navigator Documentation  Oncology Nurse Navigator Flowsheets 10/14/2014  Navigator Encounter Type Telephone  Patient Visit Type Surgery - Patient scheduled for f/u CT and appointment with Dr. Roxan Hockey.  Called patient to remind him of his appointments and to check in.  Patient reports that he is doing well.  He denies any complaints.  He continues to struggle with trying to stop smoking.  He has tried using patches, electronic cigarettes and the on line support.  He has reduced his smoking but has been unable to quit.  Treatment Phase Other  Barriers/Navigation Needs No barriers at this time  Interventions None required - Patient denies any questions or concerns at this time.  I encouraged him to call me for any needs.  Time Spent with Patient 15
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
## 2014-10-15 ENCOUNTER — Encounter: Payer: Self-pay | Admitting: Physical Therapy

## 2014-10-15 ENCOUNTER — Ambulatory Visit: Payer: Medicare Other | Attending: Neurology | Admitting: Physical Therapy

## 2014-10-15 DIAGNOSIS — R269 Unspecified abnormalities of gait and mobility: Secondary | ICD-10-CM | POA: Diagnosis not present

## 2014-10-15 DIAGNOSIS — R29898 Other symptoms and signs involving the musculoskeletal system: Secondary | ICD-10-CM | POA: Diagnosis not present
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
## 2014-10-15 NOTE — Patient Instructions (Signed)
Gastroc / Heel Cord Stretch - On Step   Stand with heels over edge of stair. Holding rail, lower heels until stretch is felt in calf of legs. Repeat __1_ times. Do _3__ times per day. (HOLD for 45-60 secs)  Copyright  VHI. All rights reserved.  Ankle Plantar Flexion / Dorsiflexion, Standing   Stand while holding a stable object. Rise up on toes. Then rock back on heels. Hold each position _3__ seconds. Repeat 10  times per session. Do __2-3_ sessions per day.  ALSO make letters a-z alphabet with right foot to get all ankle motions  Copyright  VHI. All rights reserved.

## 2014-10-21 ENCOUNTER — Encounter: Payer: Self-pay | Admitting: Internal Medicine

## 2014-11-13 IMAGING — CR DG CHEST 1V PORT
1 series · 1 of 1 positions shown · non-contrast
Comparison: 08/20/2013 and 08/16/2013.

CLINICAL DATA: Chest tube removal.

EXAM:
PORTABLE CHEST - 1 VIEW

[AP]
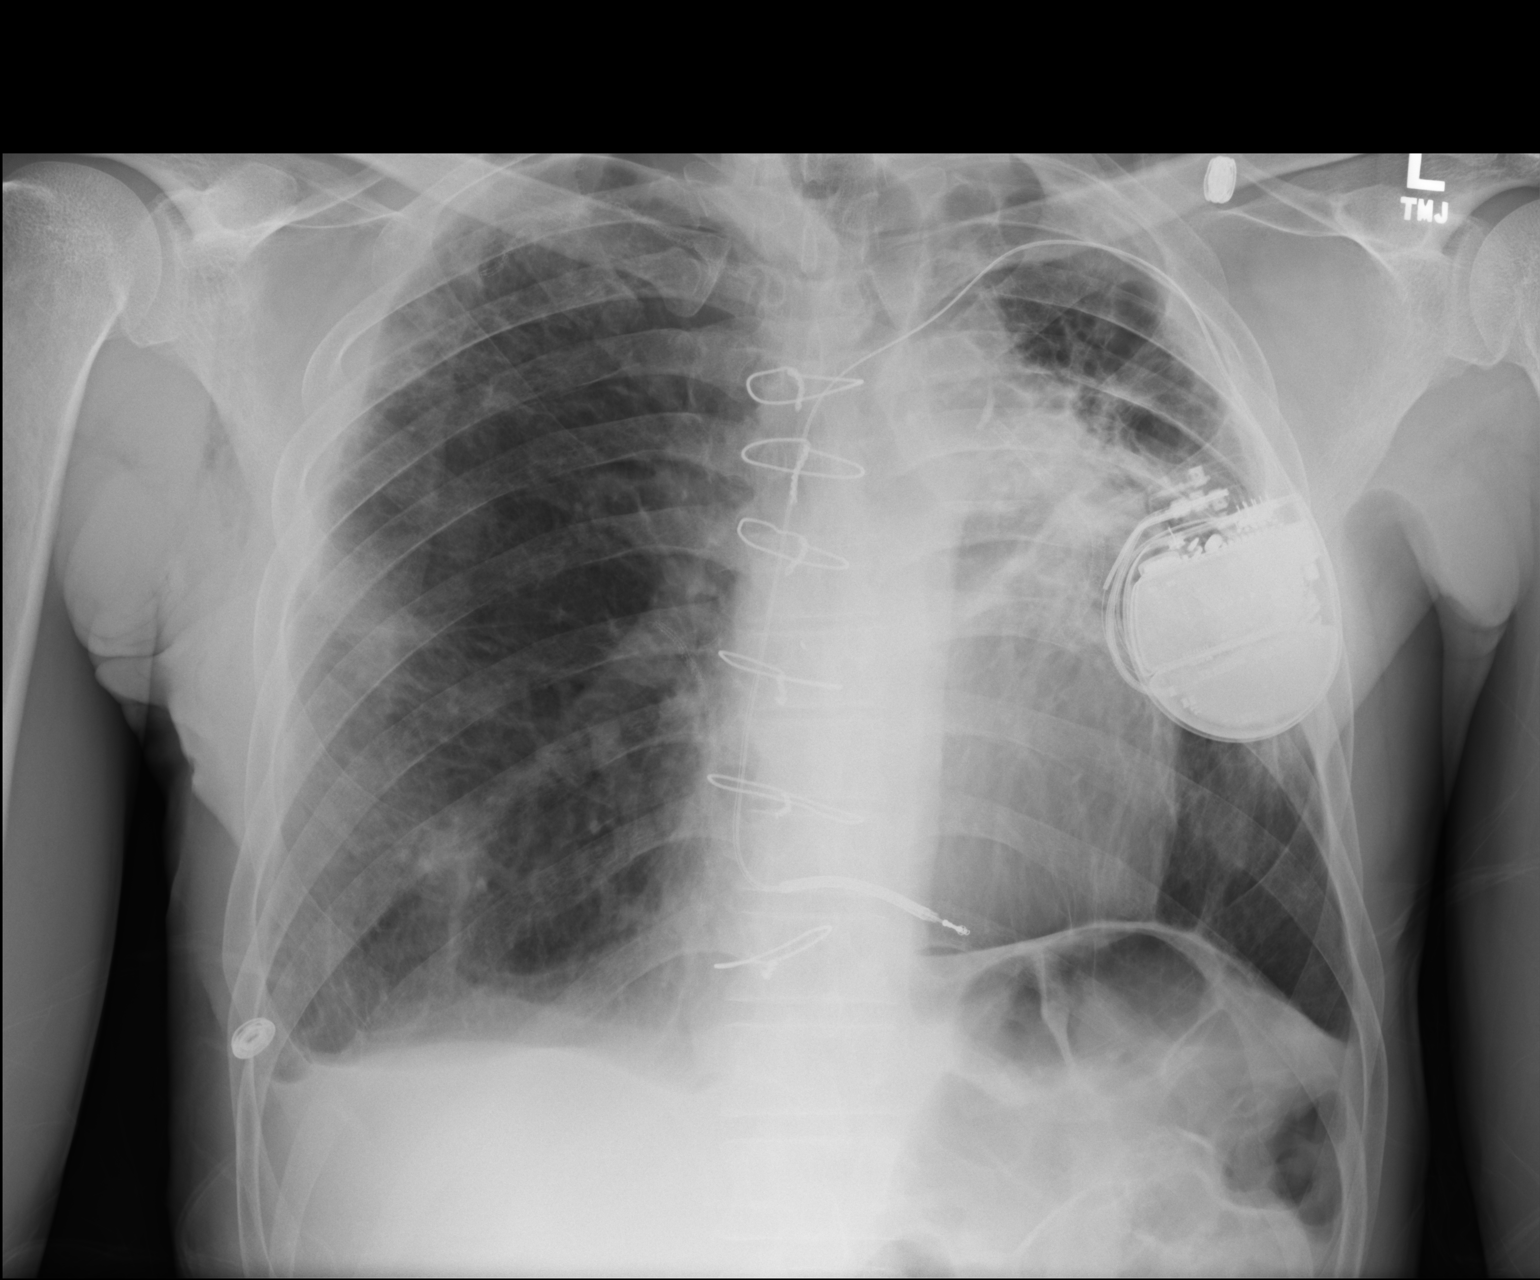

[1 of 1 positions shown; findings below may reference images not displayed]

FINDINGS: 8381 hr. Right chest tube has been removed in the interval. The
right apical pneumothorax is unchanged, approximately 5%. There is
chronic mediastinal shift to the left and chronic lung disease with
perihilar scarring on the left. The heart size and mediastinal
contours are stable. Left subclavian pacemaker lead appears
unchanged.
IMPRESSION: Stable small right apical pneumothorax following right chest
removal. No new findings.

## 2014-11-14 ENCOUNTER — Other Ambulatory Visit: Payer: Self-pay | Admitting: *Deleted

## 2014-11-14 MED ORDER — EZETIMIBE 10 MG PO TABS: 10.0000 mg | ORAL_TABLET | Freq: Every day | ORAL | Status: DC

## 2014-11-18 ENCOUNTER — Encounter (HOSPITAL_COMMUNITY): Payer: Self-pay | Admitting: Emergency Medicine

## 2014-11-18 ENCOUNTER — Emergency Department (HOSPITAL_COMMUNITY): Payer: Medicare Other

## 2014-11-18 ENCOUNTER — Emergency Department (HOSPITAL_COMMUNITY)
Admission: EM | Admit: 2014-11-18 | Discharge: 2014-11-18 | Disposition: A | Discharge status: Home / Self Care | Payer: Medicare Other | Attending: Emergency Medicine | Admitting: Emergency Medicine

## 2014-11-18 DIAGNOSIS — Z85118 Personal history of other malignant neoplasm of bronchus and lung: Secondary | ICD-10-CM | POA: Insufficient documentation

## 2014-11-18 DIAGNOSIS — Z8659 Personal history of other mental and behavioral disorders: Secondary | ICD-10-CM | POA: Diagnosis not present

## 2014-11-18 DIAGNOSIS — I959 Hypotension, unspecified: Secondary | ICD-10-CM

## 2014-11-18 DIAGNOSIS — J449 Chronic obstructive pulmonary disease, unspecified: Secondary | ICD-10-CM | POA: Diagnosis not present

## 2014-11-18 DIAGNOSIS — Z862 Personal history of diseases of the blood and blood-forming organs and certain disorders involving the immune mechanism: Secondary | ICD-10-CM | POA: Diagnosis not present

## 2014-11-18 DIAGNOSIS — J159 Unspecified bacterial pneumonia: Secondary | ICD-10-CM | POA: Insufficient documentation

## 2014-11-18 DIAGNOSIS — E785 Hyperlipidemia, unspecified: Secondary | ICD-10-CM | POA: Insufficient documentation

## 2014-11-18 DIAGNOSIS — I252 Old myocardial infarction: Secondary | ICD-10-CM | POA: Insufficient documentation

## 2014-11-18 DIAGNOSIS — F17210 Nicotine dependence, cigarettes, uncomplicated: Secondary | ICD-10-CM | POA: Diagnosis not present

## 2014-11-18 DIAGNOSIS — Z8619 Personal history of other infectious and parasitic diseases: Secondary | ICD-10-CM | POA: Insufficient documentation

## 2014-11-18 DIAGNOSIS — Z9861 Coronary angioplasty status: Secondary | ICD-10-CM | POA: Diagnosis not present

## 2014-11-18 DIAGNOSIS — R5383 Other fatigue: Secondary | ICD-10-CM | POA: Insufficient documentation

## 2014-11-18 DIAGNOSIS — I25119 Atherosclerotic heart disease of native coronary artery with unspecified angina pectoris: Secondary | ICD-10-CM | POA: Diagnosis not present

## 2014-11-18 DIAGNOSIS — R079 Chest pain, unspecified: Secondary | ICD-10-CM | POA: Diagnosis present

## 2014-11-18 DIAGNOSIS — Z95810 Presence of automatic (implantable) cardiac defibrillator: Secondary | ICD-10-CM | POA: Insufficient documentation

## 2014-11-18 DIAGNOSIS — J189 Pneumonia, unspecified organism: Secondary | ICD-10-CM

## 2014-11-18 DIAGNOSIS — R61 Generalized hyperhidrosis: Secondary | ICD-10-CM | POA: Insufficient documentation

## 2014-11-18 DIAGNOSIS — R1110 Vomiting, unspecified: Secondary | ICD-10-CM | POA: Insufficient documentation

## 2014-11-18 DIAGNOSIS — Z7952 Long term (current) use of systemic steroids: Secondary | ICD-10-CM | POA: Diagnosis not present

## 2014-11-18 DIAGNOSIS — R0789 Other chest pain: Secondary | ICD-10-CM | POA: Diagnosis not present

## 2014-11-18 DIAGNOSIS — Z79899 Other long term (current) drug therapy: Secondary | ICD-10-CM | POA: Diagnosis not present

## 2014-11-18 DIAGNOSIS — R0602 Shortness of breath: Secondary | ICD-10-CM | POA: Diagnosis not present

## 2014-11-18 DIAGNOSIS — Z951 Presence of aortocoronary bypass graft: Secondary | ICD-10-CM | POA: Insufficient documentation

## 2014-11-18 DIAGNOSIS — Z7982 Long term (current) use of aspirin: Secondary | ICD-10-CM | POA: Diagnosis not present

## 2014-11-18 DIAGNOSIS — Z720 Tobacco use: Secondary | ICD-10-CM | POA: Insufficient documentation

## 2014-11-18 LAB — CBC
HCT: 38.9 % — ABNORMAL LOW (ref 39.0–52.0)
Hemoglobin: 13.6 g/dL (ref 13.0–17.0)
MCH: 29.1 pg (ref 26.0–34.0)
MCHC: 35 g/dL (ref 30.0–36.0)
MCV: 83.1 fL (ref 78.0–100.0)
PLATELETS: 410 10*3/uL — AB (ref 150–400)
RBC: 4.68 MIL/uL (ref 4.22–5.81)
RDW: 13.1 % (ref 11.5–15.5)
WBC: 10.4 10*3/uL (ref 4.0–10.5)

## 2014-11-18 LAB — BASIC METABOLIC PANEL
Anion gap: 6 (ref 5–15)
BUN: 16 mg/dL (ref 6–20)
CALCIUM: 9.3 mg/dL (ref 8.9–10.3)
CO2: 22 mmol/L (ref 22–32)
Chloride: 104 mmol/L (ref 101–111)
Creatinine, Ser: 0.73 mg/dL (ref 0.61–1.24)
GFR calc Af Amer: >60 mL/min (ref >60)
Glucose, Bld: 90 mg/dL (ref 65–99)
POTASSIUM: 3.9 mmol/L (ref 3.5–5.1)
SODIUM: 132 mmol/L — AB (ref 135–145)

## 2014-11-18 LAB — BRAIN NATRIURETIC PEPTIDE: B NATRIURETIC PEPTIDE 5: 128.5 pg/mL — AB (ref 0.0–100.0)

## 2014-11-18 LAB — I-STAT TROPONIN, ED
TROPONIN I, POC: 0.02 ng/mL (ref 0.00–0.08)
Troponin i, poc: 0.04 ng/mL (ref 0.00–0.08)

## 2014-11-18 LAB — I-STAT CG4 LACTIC ACID, ED: Lactic Acid, Venous: 1.3 mmol/L (ref 0.5–2.0)

## 2014-11-18 MED ORDER — LEVOFLOXACIN IN D5W 750 MG/150ML IV SOLN
750.0000 mg | Freq: Once | INTRAVENOUS | Status: AC
  Administered 2014-11-18: 750 mg via INTRAVENOUS
  Filled 2014-11-18: qty 150

## 2014-11-18 MED ORDER — SODIUM CHLORIDE 0.9 % IV BOLUS (SEPSIS)
500.0000 mL | Freq: Once | INTRAVENOUS | Status: AC
  Administered 2014-11-18: 500 mL via INTRAVENOUS

## 2014-11-18 MED ORDER — LEVOFLOXACIN 750 MG PO TABS: 750.0000 mg | ORAL_TABLET | Freq: Every day | ORAL | Status: DC

## 2014-11-18 MED ORDER — SODIUM CHLORIDE 0.9 % IV BOLUS (SEPSIS)
1000.0000 mL | Freq: Once | INTRAVENOUS | Status: AC
  Administered 2014-11-18: 1000 mL via INTRAVENOUS

## 2014-11-18 MED ORDER — SODIUM CHLORIDE 0.9 % IV BOLUS (SEPSIS)
1000.0000 mL | Freq: Once | INTRAVENOUS | Status: AC
Start: 2014-11-18 — End: 2014-11-18
  Administered 2014-11-18: 1000 mL via INTRAVENOUS
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
## 2014-11-18 NOTE — ED Notes (Signed)
Pt. reports left chest pain with SOB , emesis and diaphoresis onset 2 days ago .

## 2014-11-18 NOTE — Discharge Instructions (Signed)
Chest Pain (Nonspecific) °It is often hard to give a specific diagnosis for the cause of chest pain. There is always a chance that your pain could be related to something serious, such as a heart attack or a blood clot in the lungs. You need to follow up with your health care provider for further evaluation. °CAUSES  °· Heartburn. °· Pneumonia or bronchitis. °· Anxiety or stress. °· Inflammation around your heart (pericarditis) or lung (pleuritis or pleurisy). °· A blood clot in the lung. °· A collapsed lung (pneumothorax). It can develop suddenly on its own (spontaneous pneumothorax) or from trauma to the chest. °· Shingles infection (herpes zoster virus). °The chest wall is composed of bones, muscles, and cartilage. Any of these can be the source of the pain. °· The bones can be bruised by injury. °· The muscles or cartilage can be strained by coughing or overwork. °· The cartilage can be affected by inflammation and become sore (costochondritis). °DIAGNOSIS  °Lab tests or other studies may be needed to find the cause of your pain. Your health care provider may have you take a test called an ambulatory electrocardiogram (ECG). An ECG records your heartbeat patterns over a 24-hour period. You may also have other tests, such as: °· Transthoracic echocardiogram (TTE). During echocardiography, sound waves are used to evaluate how blood flows through your heart. °· Transesophageal echocardiogram (TEE). °· Cardiac monitoring. This allows your health care provider to monitor your heart rate and rhythm in real time. °· Holter monitor. This is a portable device that records your heartbeat and can help diagnose heart arrhythmias. It allows your health care provider to track your heart activity for several days, if needed. °· Stress tests by exercise or by giving medicine that makes the heart beat faster. °TREATMENT  °· Treatment depends on what may be causing your chest pain. Treatment may include: °· Acid blockers for  heartburn. °· Anti-inflammatory medicine. °· Pain medicine for inflammatory conditions. °· Antibiotics if an infection is present. °· You may be advised to change lifestyle habits. This includes stopping smoking and avoiding alcohol, caffeine, and chocolate. °· You may be advised to keep your head raised (elevated) when sleeping. This reduces the chance of acid going backward from your stomach into your esophagus. °Most of the time, nonspecific chest pain will improve within 2-3 days with rest and mild pain medicine.  °HOME CARE INSTRUCTIONS  °· If antibiotics were prescribed, take them as directed. Finish them even if you start to feel better. °· For the next few days, avoid physical activities that bring on chest pain. Continue physical activities as directed. °· Do not use any tobacco products, including cigarettes, chewing tobacco, or electronic cigarettes. °· Avoid drinking alcohol. °· Only take medicine as directed by your health care provider. °· Follow your health care provider's suggestions for further testing if your chest pain does not go away. °· Keep any follow-up appointments you made. If you do not go to an appointment, you could develop lasting (chronic) problems with pain. If there is any problem keeping an appointment, call to reschedule. °SEEK MEDICAL CARE IF:  °· Your chest pain does not go away, even after treatment. °· You have a rash with blisters on your chest. °· You have a fever. °SEEK IMMEDIATE MEDICAL CARE IF:  °· You have increased chest pain or pain that spreads to your arm, neck, jaw, back, or abdomen. °· You have shortness of breath. °· You have an increasing cough, or you cough   up blood.  You have severe back or abdominal pain.  You feel nauseous or vomit.  You have severe weakness.  You faint.  You have chills. This is an emergency. Do not wait to see if the pain will go away. Get medical help at once. Call your local emergency services (911 in [place] Do not drive  yourself to the hospital. MAKE SURE YOU:   Understand these instructions.  Will watch your condition.  Will get help right away if you are not doing well or get worse. Document Released: 02/23/2005 Document Revised: 05/21/2013 Document Reviewed: 12/20/2007 [facility] Patient Information 2015 [place], [place]. This information is not intended to replace advice given to you by your health care provider. Make sure you discuss any questions you have with your health care provider.  Hypotension As your heart beats, it forces blood through your arteries. This force is your blood pressure. If your blood pressure is too low for you to go about your normal activities or to support the organs of your body, you have hypotension. Hypotension is also referred to as low blood pressure. When your blood pressure becomes too low, you may not get enough blood to your brain. As a result, you may feel weak, feel lightheaded, or develop a rapid heart rate. In a more severe case, you may faint. CAUSES Various conditions can cause hypotension. These include:  Blood loss.  Dehydration.  Heart or endocrine problems.  Pregnancy.  Severe infection.  Not having a well-balanced diet filled with needed nutrients.  Severe allergic reactions (anaphylaxis). Some medicines, such as blood pressure medicine or water pills (diuretics), may lower your blood pressure below normal. Sometimes taking too much medicine or taking medicine not as directed can cause hypotension. TREATMENT  Hospitalization is sometimes required for hypotension if fluid or blood replacement is needed, if time is needed for medicines to wear off, or if further monitoring is needed. Treatment might include changing your diet, changing your medicines (including medicines aimed at raising your blood pressure), and use of support stockings. HOME CARE INSTRUCTIONS   Drink enough fluids to keep your urine clear or pale yellow.  Take your medicines as  directed by your health care provider.  Get up slowly from reclining or sitting positions. This gives your blood pressure a chance to adjust.  Wear support stockings as directed by your health care provider.  Maintain a healthy diet by including nutritious food, such as fruits, vegetables, nuts, whole grains, and lean meats. SEEK MEDICAL CARE IF:  You have vomiting or diarrhea.  You have a fever for more than 2-3 days.  You feel more thirsty than usual.  You feel weak and tired. SEEK IMMEDIATE MEDICAL CARE IF:   You have chest pain or a fast or irregular heartbeat.  You have a loss of feeling in some part of your body, or you lose movement in your arms or legs.  You have trouble speaking.  You become sweaty or feel lightheaded.  You faint. MAKE SURE YOU:   Understand these instructions.  Will watch your condition.  Will get help right away if you are not doing well or get worse. Document Released: 05/16/2005 Document Revised: 03/06/2013 Document Reviewed: 11/16/2012 [facility] [place] [facility] Patient Information 2015 [place], [place]. This information is not intended to replace advice given to you by your health care provider. Make sure you discuss any questions you have with your health care provider.  Pneumonia Pneumonia is an infection of the lungs.  CAUSES Pneumonia may be caused  by bacteria or a virus. Usually, these infections are caused by breathing infectious particles into the lungs (respiratory tract). SIGNS AND SYMPTOMS   Cough.  Fever.  Chest pain.  Increased rate of breathing.  Wheezing.  Mucus production. DIAGNOSIS  If you have the common symptoms of pneumonia, your health care provider will typically confirm the diagnosis with a chest X-ray. The X-ray will show an abnormality in the lung (pulmonary infiltrate) if you have pneumonia. Other tests of your blood, urine, or sputum may be done to find the specific cause of your pneumonia. Your health care provider may  also do tests (blood gases or pulse oximetry) to see how well your lungs are working. TREATMENT  Some forms of pneumonia may be spread to other people when you cough or sneeze. You may be asked to wear a mask before and during your exam. Pneumonia that is caused by bacteria is treated with antibiotic medicine. Pneumonia that is caused by the influenza virus may be treated with an antiviral medicine. Most other viral infections must run their course. These infections will not respond to antibiotics.  HOME CARE INSTRUCTIONS   Cough suppressants may be used if you are losing too much rest. However, coughing protects you by clearing your lungs. You should avoid using cough suppressants if you can.  Your health care provider may have prescribed medicine if he or she thinks your pneumonia is caused by bacteria or influenza. Finish your medicine even if you start to feel better.  Your health care provider may also prescribe an expectorant. This loosens the mucus to be coughed up.  Take medicines only as directed by your health care provider.  Do not smoke. Smoking is a common cause of bronchitis and can contribute to pneumonia. If you are a smoker and continue to smoke, your cough may last several weeks after your pneumonia has cleared.  A cold steam vaporizer or humidifier in your room or home may help loosen mucus.  Coughing is often worse at night. Sleeping in a semi-upright position in a recliner or using a couple pillows under your head will help with this.  Get rest as you feel it is needed. Your body will usually let you know when you need to rest. PREVENTION A pneumococcal shot (vaccine) is available to prevent a common bacterial cause of pneumonia. This is usually suggested for:  People over 46 years old.  Patients on chemotherapy.  People with chronic lung problems, such as bronchitis or emphysema.  People with immune system problems. If you are over 65 or have a high risk condition,  you may receive the pneumococcal vaccine if you have not received it before. In some countries, a routine influenza vaccine is also recommended. This vaccine can help prevent some cases of pneumonia.You may be offered the influenza vaccine as part of your care. If you smoke, it is time to quit. You may receive instructions on how to stop smoking. Your health care provider can provide medicines and counseling to help you quit. SEEK MEDICAL CARE IF: You have a fever. SEEK IMMEDIATE MEDICAL CARE IF:   Your illness becomes worse. This is especially true if you are elderly or weakened from any other disease.  You cannot control your cough with suppressants and are losing sleep.  You begin coughing up blood.  You develop pain which is getting worse or is uncontrolled with medicines.  Any of the symptoms which initially brought you in for treatment are getting worse rather than better.  You develop shortness of breath or chest pain. MAKE SURE YOU:   Understand these instructions.  Will watch your condition.  Will get help right away if you are not doing well or get worse. Document Released: 05/16/2005 Document Revised: 09/30/2013 Document Reviewed: 08/05/2010 [facility] Patient Information 2015 [place], [place]. This information is not intended to replace advice given to you by your health care provider. Make sure you discuss any questions you have with your health care provider.

## 2014-11-19 IMAGING — CR DG CHEST 2V
2 series · 2 of 2 positions shown · non-contrast
Comparison: DG CHEST 1V PORT dated 08/21/2013;

CLINICAL DATA: Lung mass.  Lung cancer.  Right lower chest pain.

EXAM:
CHEST  2 VIEW

[w chest pa]
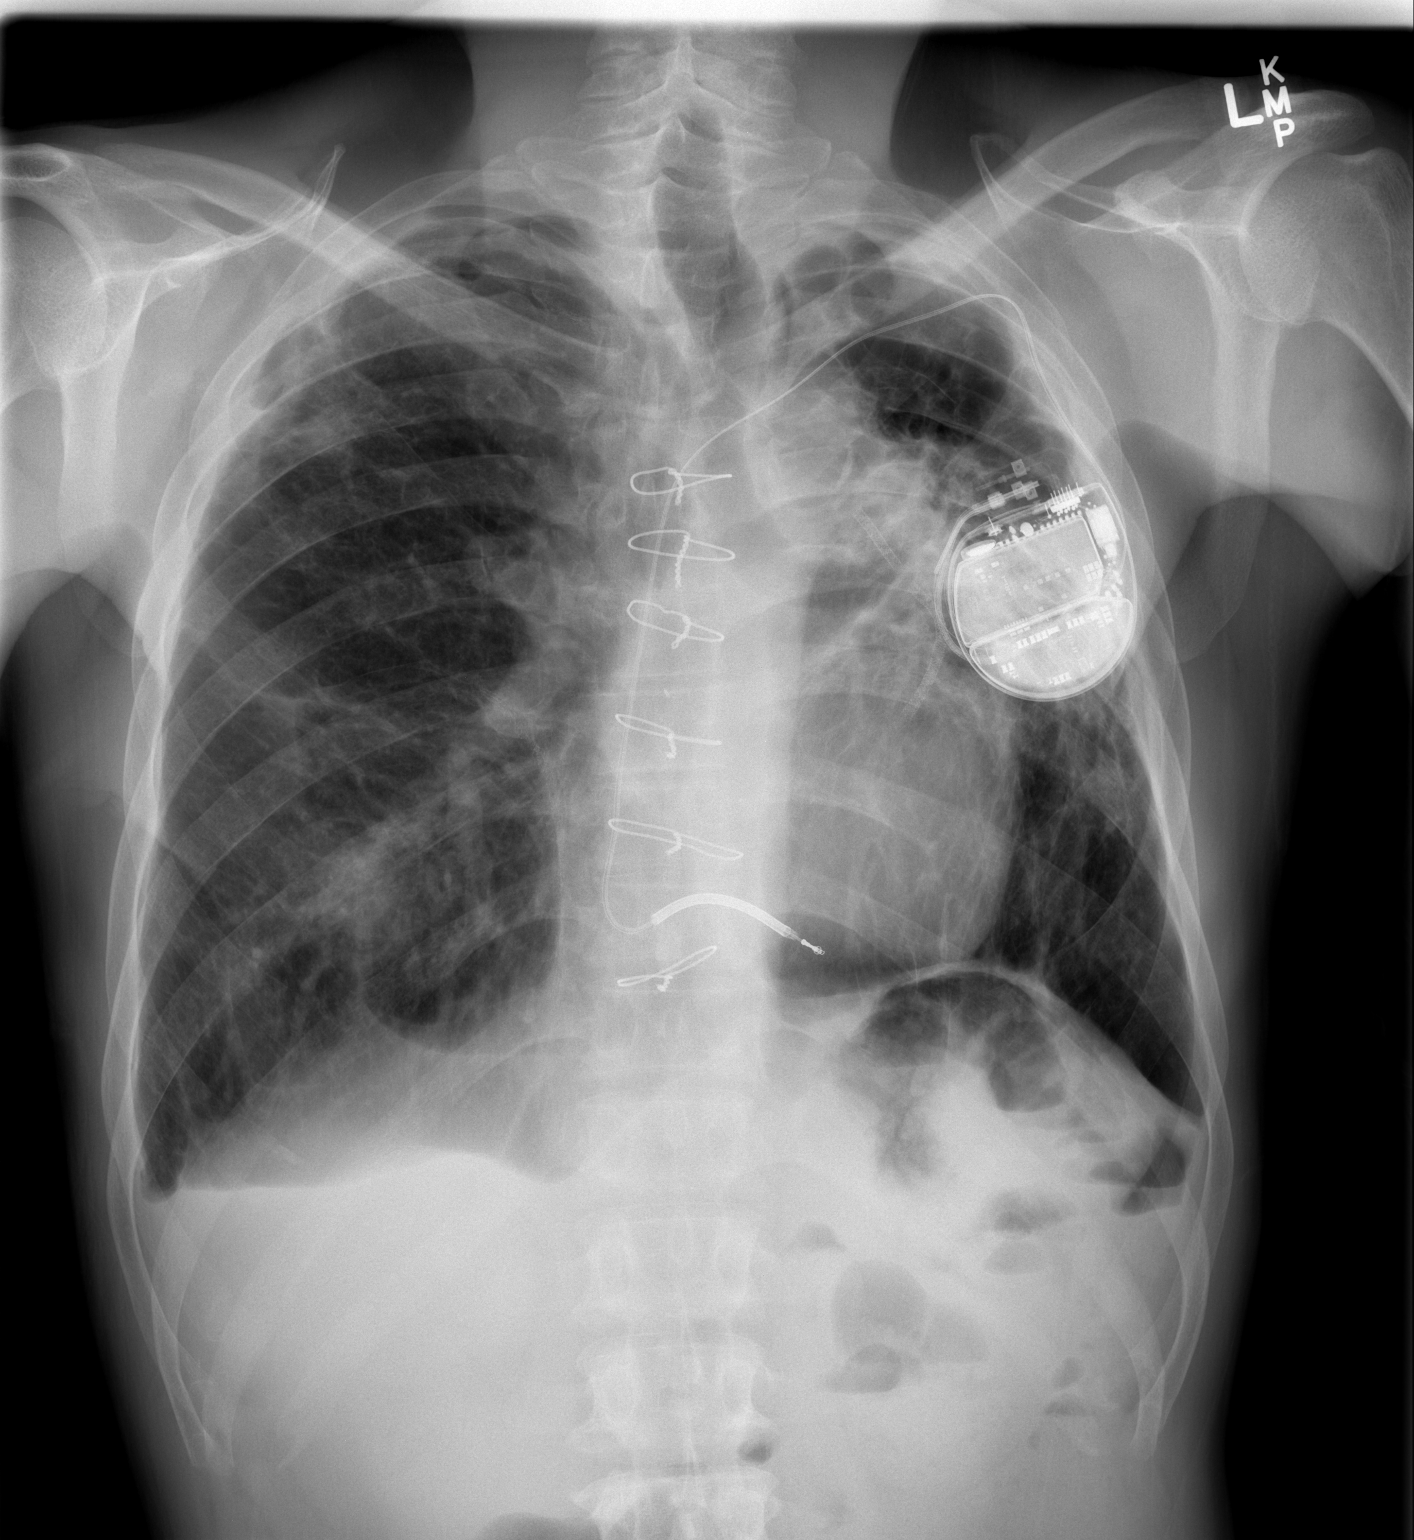

[w chest lat]
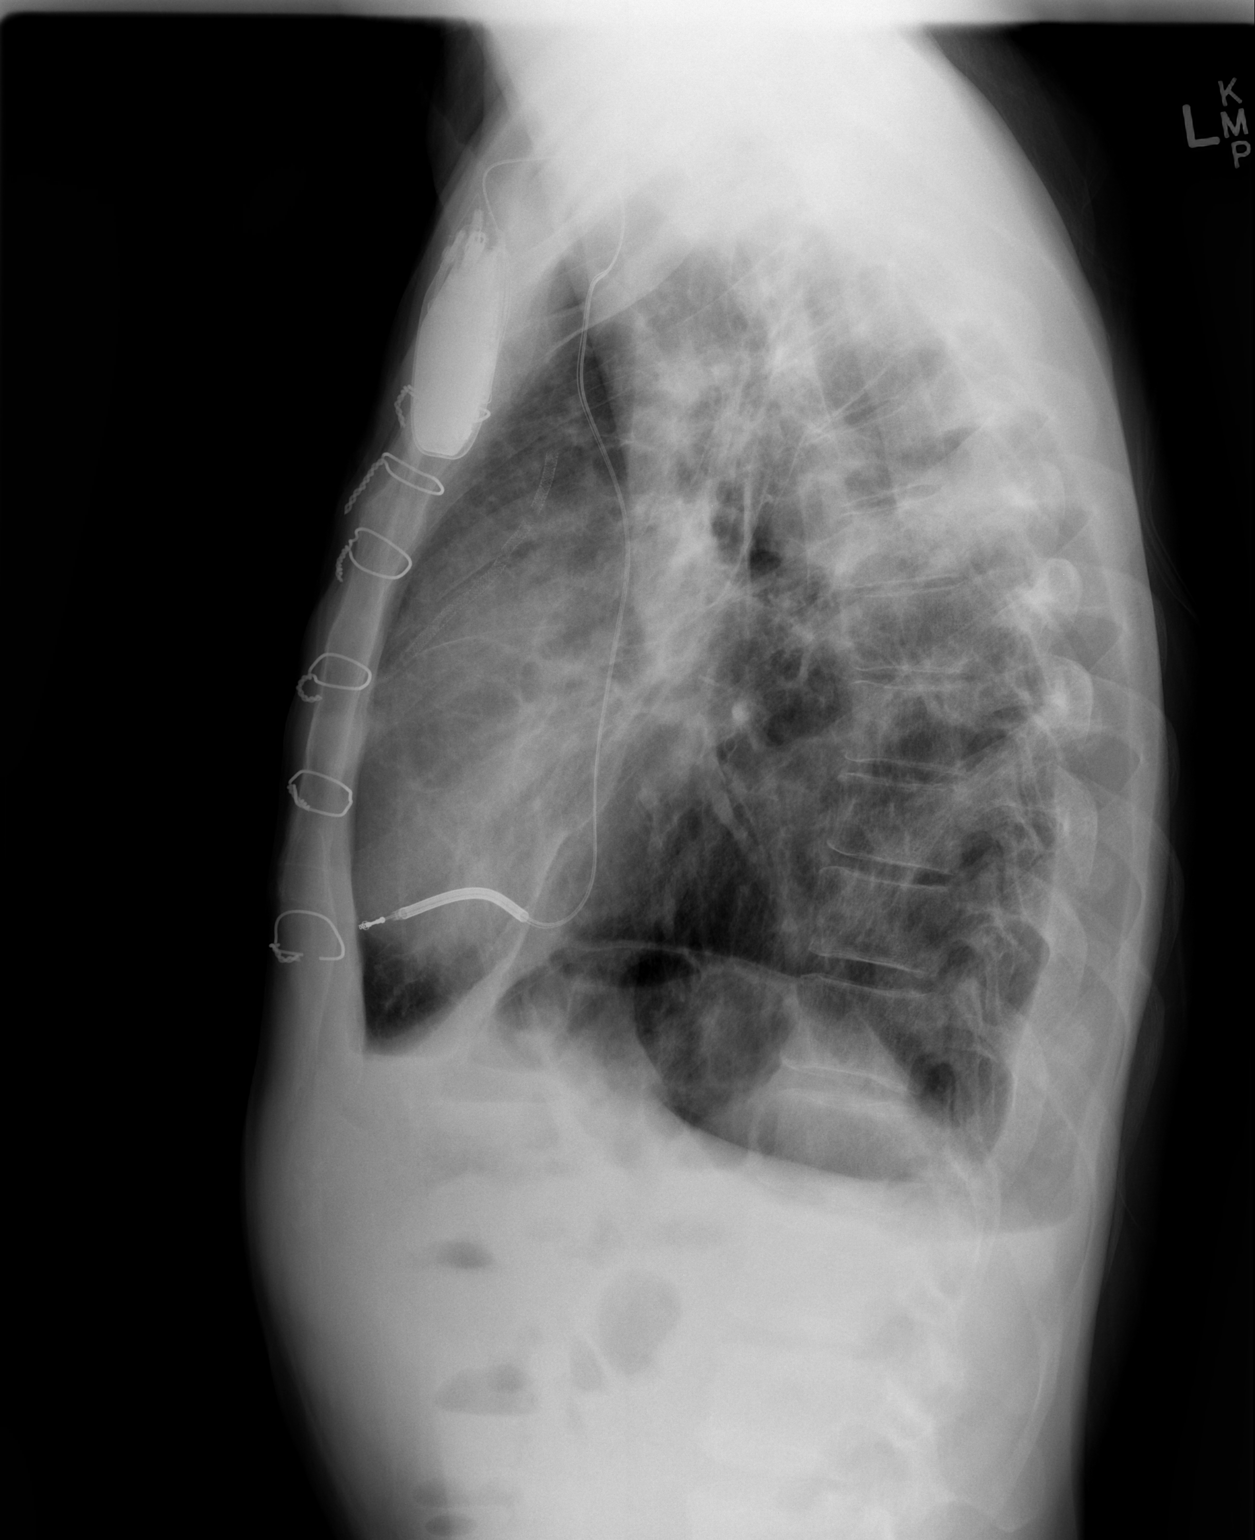

[2 of 2 positions shown; findings below may reference images not displayed]

DG CHEST 2 VIEW dated
08/20/2013; DG CHEST 2 VIEW dated 08/15/2013; DG CHEST 2 VIEW dated
08/16/2013; CT BIOPSY dated 07/18/2013; CT CHEST W/O CM dated
06/06/2013; DG CHEST 1 VIEW dated [DATE]
FINDINGS: Left subclavian AICD. Left chest appears similar to the prior exam.
Small right hydro pneumothorax appears unchanged compared to the
most recent prior exam of 08/21/2013. Right basilar opacity also
appears similar. Pulmonary parenchymal scarring in the right upper
lobe lateral margin also appears similar.
IMPRESSION: 1. Stable small right hydropneumothorax. The pneumothorax component
is apical and tiny, without interval change compared to prior.
2. Stable pulmonary parenchymal scarring with ill-defined opacity at
the right base.

## 2014-11-20 ENCOUNTER — Other Ambulatory Visit: Payer: Self-pay

## 2014-11-20 MED ORDER — CARVEDILOL 3.125 MG PO TABS: 3.1250 mg | ORAL_TABLET | Freq: Two times a day (BID) | ORAL | Status: DC

## 2014-11-20 NOTE — Telephone Encounter (Signed)
Garrett Serge, NP at 09/25/2014 4:21 PM   carvedilol (COREG) 3.125 MG tablet Take 1 tablet (3.125 mg total) by mouth 2 (two) times daily with a meal         ASSESSMENT AND PLAN:  1. Ischemic Cardiomyopathy: BP too soft to titrate medications further. Continue current dose of ACEI, beta blocker. pt euvolemic. Last Echo in 2014.   2. CAD: No angina. Continue aspirin, beta blocker, statin

## 2014-11-23 LAB — CULTURE, BLOOD (ROUTINE X 2)
CULTURE: NO GROWTH
CULTURE: NO GROWTH

## 2014-12-11 ENCOUNTER — Other Ambulatory Visit: Payer: Self-pay | Admitting: Thoracic Surgery (Cardiothoracic Vascular Surgery)

## 2014-12-11 DIAGNOSIS — R911 Solitary pulmonary nodule: Secondary | ICD-10-CM

## 2015-01-01 DIAGNOSIS — Z0289 Encounter for other administrative examinations: Secondary | ICD-10-CM

## 2015-01-02 ENCOUNTER — Telehealth: Payer: Self-pay | Admitting: *Deleted

## 2015-01-02 NOTE — Telephone Encounter (Signed)
Patient records are at the front desk.

## 2015-01-07 ENCOUNTER — Ambulatory Visit
Admission: RE | Admit: 2015-01-07 | Discharge: 2015-01-07 | Disposition: A | Discharge status: Home / Self Care | Payer: Medicare Other | Source: Ambulatory Visit | Attending: Thoracic Surgery (Cardiothoracic Vascular Surgery) | Admitting: Thoracic Surgery (Cardiothoracic Vascular Surgery)

## 2015-01-07 DIAGNOSIS — R911 Solitary pulmonary nodule: Secondary | ICD-10-CM

## 2015-01-07 DIAGNOSIS — J479 Bronchiectasis, uncomplicated: Secondary | ICD-10-CM | POA: Diagnosis not present

## 2015-01-07 DIAGNOSIS — R590 Localized enlarged lymph nodes: Secondary | ICD-10-CM | POA: Diagnosis not present

## 2015-01-07 DIAGNOSIS — Z85118 Personal history of other malignant neoplasm of bronchus and lung: Secondary | ICD-10-CM | POA: Diagnosis not present

## 2015-01-13 ENCOUNTER — Encounter: Payer: Self-pay | Admitting: Thoracic Surgery (Cardiothoracic Vascular Surgery)

## 2015-01-13 ENCOUNTER — Other Ambulatory Visit: Payer: Medicare Other

## 2015-01-13 ENCOUNTER — Ambulatory Visit (INDEPENDENT_AMBULATORY_CARE_PROVIDER_SITE_OTHER): Payer: Medicare Other | Admitting: Thoracic Surgery (Cardiothoracic Vascular Surgery)

## 2015-01-13 VITALS — BP 116/73 | HR 68 | Resp 20 | Ht 68.0 in | Wt 122.0 lb

## 2015-01-13 DIAGNOSIS — I255 Ischemic cardiomyopathy: Secondary | ICD-10-CM

## 2015-01-13 DIAGNOSIS — R911 Solitary pulmonary nodule: Secondary | ICD-10-CM | POA: Diagnosis not present

## 2015-01-13 DIAGNOSIS — Z85118 Personal history of other malignant neoplasm of bronchus and lung: Secondary | ICD-10-CM

## 2015-01-13 DIAGNOSIS — C3491 Malignant neoplasm of unspecified part of right bronchus or lung: Secondary | ICD-10-CM | POA: Diagnosis not present
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
## 2015-01-16 ENCOUNTER — Other Ambulatory Visit: Payer: Self-pay | Admitting: Internal Medicine

## 2015-01-23 ENCOUNTER — Encounter: Payer: Self-pay | Admitting: Internal Medicine

## 2015-01-27 ENCOUNTER — Other Ambulatory Visit: Payer: Self-pay

## 2015-01-27 MED ORDER — LISINOPRIL 5 MG PO TABS: 5.0000 mg | ORAL_TABLET | Freq: Every day | ORAL | Status: DC

## 2015-01-27 NOTE — Telephone Encounter (Signed)
Isaiah Serge, NP at 09/25/2014 4:21 PM   lisinopril (PRINIVIL,ZESTRIL) 5 MG tablet Take 1 tablet (5 mg total) by mouth daily         Current medicines are reviewed with the patient today. The patient Has no concerns regarding medicines.  Notes Recorded by Lelon Perla, MD on 09/30/2014 at 7:44 AM [place] physician recommends that you schedule a follow-up appointment in: September with Dr. Caryl Comes  Your physician recommends that you schedule a follow-up appointment in: 4 months with Dr Stanford Breed.

## 2015-03-09 ENCOUNTER — Encounter: Payer: Medicare Other | Admitting: Internal Medicine

## 2015-03-10 ENCOUNTER — Other Ambulatory Visit: Payer: Self-pay | Admitting: Internal Medicine

## 2015-03-10 MED ORDER — LISINOPRIL 5 MG PO TABS: 5.0000 mg | ORAL_TABLET | Freq: Every day | ORAL | Status: DC

## 2015-03-26 ENCOUNTER — Other Ambulatory Visit: Payer: Self-pay | Admitting: *Deleted

## 2015-03-26 DIAGNOSIS — R918 Other nonspecific abnormal finding of lung field: Secondary | ICD-10-CM

## 2015-04-09 ENCOUNTER — Ambulatory Visit: Payer: Medicare Other | Admitting: Internal Medicine

## 2015-04-14 ENCOUNTER — Ambulatory Visit
Admission: RE | Admit: 2015-04-14 | Discharge: 2015-04-14 | Disposition: A | Discharge status: Home / Self Care | Payer: Medicare Other | Source: Ambulatory Visit | Attending: Thoracic Surgery (Cardiothoracic Vascular Surgery) | Admitting: Thoracic Surgery (Cardiothoracic Vascular Surgery)

## 2015-04-14 ENCOUNTER — Ambulatory Visit (INDEPENDENT_AMBULATORY_CARE_PROVIDER_SITE_OTHER): Payer: Medicare Other | Admitting: Thoracic Surgery (Cardiothoracic Vascular Surgery)

## 2015-04-14 ENCOUNTER — Encounter: Payer: Self-pay | Admitting: Thoracic Surgery (Cardiothoracic Vascular Surgery)

## 2015-04-14 ENCOUNTER — Encounter: Payer: Self-pay | Admitting: *Deleted

## 2015-04-14 VITALS — BP 121/66 | HR 92 | Resp 20 | Ht 68.0 in | Wt 122.0 lb

## 2015-04-14 DIAGNOSIS — R918 Other nonspecific abnormal finding of lung field: Secondary | ICD-10-CM | POA: Diagnosis not present

## 2015-04-14 DIAGNOSIS — C3491 Malignant neoplasm of unspecified part of right bronchus or lung: Secondary | ICD-10-CM | POA: Diagnosis not present

## 2015-04-14 DIAGNOSIS — I255 Ischemic cardiomyopathy: Secondary | ICD-10-CM

## 2015-04-14 NOTE — Progress Notes (Signed)
  Oncology Nurse Navigator Documentation    Navigator Encounter Type: Other (Routine follow up) (04/14/15 1230) Patient Visit Type: Surgery;Follow-up (04/14/15 1230) Treatment Phase: Abnormal Scans (04/14/15 1230) Barriers/Navigation Needs: No barriers at this time (04/14/15 1230)   Interventions: None required (04/14/15 1230)  Patient at [place] office for routine follow up.  I went to office to check in with patient.  Patient reports that he is still smoking on and off.  He has tried e cigarettes, nicotine patches, decreasing amount but he continues to smoke.  We discussed setting a stop date.  He acknowledged that I had given him the tools, he needs to make the change.  He denied any questions or concerns at this time.  I encouraged him to call as needed.         Time Spent with Patient: 15 (04/14/15 1230)
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
## 2015-04-15 DIAGNOSIS — H00026 Hordeolum internum left eye, unspecified eyelid: Secondary | ICD-10-CM | POA: Diagnosis not present

## 2015-04-17 ENCOUNTER — Encounter: Payer: Medicare Other | Admitting: Internal Medicine

## 2015-04-21 DIAGNOSIS — N390 Urinary tract infection, site not specified: Secondary | ICD-10-CM | POA: Diagnosis not present

## 2015-04-21 DIAGNOSIS — E781 Pure hyperglyceridemia: Secondary | ICD-10-CM | POA: Diagnosis not present

## 2015-04-21 DIAGNOSIS — Z0000 Encounter for general adult medical examination without abnormal findings: Secondary | ICD-10-CM | POA: Diagnosis not present

## 2015-04-21 DIAGNOSIS — I10 Essential (primary) hypertension: Secondary | ICD-10-CM | POA: Diagnosis not present

## 2015-04-21 DIAGNOSIS — J441 Chronic obstructive pulmonary disease with (acute) exacerbation: Secondary | ICD-10-CM | POA: Diagnosis not present

## 2015-04-28 DIAGNOSIS — N390 Urinary tract infection, site not specified: Secondary | ICD-10-CM | POA: Diagnosis not present

## 2015-04-28 DIAGNOSIS — Z681 Body mass index (BMI) 19 or less, adult: Secondary | ICD-10-CM | POA: Diagnosis not present

## 2015-04-30 ENCOUNTER — Other Ambulatory Visit: Payer: Self-pay | Admitting: Internal Medicine

## 2015-05-12 ENCOUNTER — Other Ambulatory Visit: Payer: Self-pay | Admitting: Cardiology

## 2015-05-12 MED ORDER — ASPIRIN EC 81 MG PO TBEC: 81.0000 mg | DELAYED_RELEASE_TABLET | Freq: Every day | ORAL | Status: DC

## 2015-05-15 ENCOUNTER — Ambulatory Visit (INDEPENDENT_AMBULATORY_CARE_PROVIDER_SITE_OTHER): Payer: Medicare Other | Admitting: Internal Medicine

## 2015-05-15 ENCOUNTER — Encounter: Payer: Self-pay | Admitting: Internal Medicine

## 2015-05-15 ENCOUNTER — Other Ambulatory Visit: Payer: Self-pay | Admitting: *Deleted

## 2015-05-15 VITALS — BP 104/70 | HR 90 | Ht 68.0 in | Wt 123.8 lb

## 2015-05-15 DIAGNOSIS — Z720 Tobacco use: Secondary | ICD-10-CM | POA: Diagnosis not present

## 2015-05-15 DIAGNOSIS — C3490 Malignant neoplasm of unspecified part of unspecified bronchus or lung: Secondary | ICD-10-CM

## 2015-05-15 DIAGNOSIS — J439 Emphysema, unspecified: Secondary | ICD-10-CM

## 2015-05-15 DIAGNOSIS — J3489 Other specified disorders of nose and nasal sinuses: Secondary | ICD-10-CM

## 2015-05-15 DIAGNOSIS — I255 Ischemic cardiomyopathy: Secondary | ICD-10-CM | POA: Diagnosis not present

## 2015-05-15 DIAGNOSIS — Z23 Encounter for immunization: Secondary | ICD-10-CM | POA: Diagnosis not present

## 2015-05-15 DIAGNOSIS — F17200 Nicotine dependence, unspecified, uncomplicated: Secondary | ICD-10-CM

## 2015-05-15 MED ORDER — SIMVASTATIN 40 MG PO TABS: 40.0000 mg | ORAL_TABLET | Freq: Every day | ORAL | Status: DC
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
## 2015-05-15 NOTE — Addendum Note (Signed)
Addended by: Maurice March on: 05/15/2015 10:27 AM   Modules accepted: Orders

## 2015-05-15 NOTE — Patient Instructions (Signed)
#  Smoking  - too bad you replased. Please quit   #COPD - stable - continue spiriva as before - add advair 100/50, 1 puff twice daily - flu shot 05/15/2015 if PCP Elyn Peers, MD cannot confirm it - pneumonia shot 05/15/2015 if PCP Elyn Peers, MD cannot confirm it   #Rhinorrhea  -nasal saline spray -if worse call us  #cancer surveillance  - through Dr Roxan Hockey; suspicion of possible recurrence Nov 2016 CT but we are not sure   #Followup  -return to seee my NP Tammy PArrett in 6 months

## 2015-06-14 ENCOUNTER — Emergency Department (HOSPITAL_COMMUNITY): Payer: Medicare Other

## 2015-06-14 ENCOUNTER — Inpatient Hospital Stay (HOSPITAL_COMMUNITY)
Admission: EM | Admit: 2015-06-14 | Discharge: 2015-06-17 | DRG: 190 | Disposition: A | Discharge status: Home / Self Care | Payer: Medicare Other | Attending: Internal Medicine | Admitting: Internal Medicine

## 2015-06-14 ENCOUNTER — Encounter (HOSPITAL_COMMUNITY): Payer: Self-pay | Admitting: Emergency Medicine

## 2015-06-14 DIAGNOSIS — E875 Hyperkalemia: Secondary | ICD-10-CM | POA: Diagnosis not present

## 2015-06-14 DIAGNOSIS — E876 Hypokalemia: Secondary | ICD-10-CM | POA: Diagnosis present

## 2015-06-14 DIAGNOSIS — J438 Other emphysema: Secondary | ICD-10-CM | POA: Diagnosis present

## 2015-06-14 DIAGNOSIS — I255 Ischemic cardiomyopathy: Secondary | ICD-10-CM | POA: Diagnosis present

## 2015-06-14 DIAGNOSIS — I2510 Atherosclerotic heart disease of native coronary artery without angina pectoris: Secondary | ICD-10-CM | POA: Diagnosis present

## 2015-06-14 DIAGNOSIS — C3490 Malignant neoplasm of unspecified part of unspecified bronchus or lung: Secondary | ICD-10-CM | POA: Diagnosis present

## 2015-06-14 DIAGNOSIS — C3431 Malignant neoplasm of lower lobe, right bronchus or lung: Secondary | ICD-10-CM | POA: Diagnosis present

## 2015-06-14 DIAGNOSIS — Z79899 Other long term (current) drug therapy: Secondary | ICD-10-CM

## 2015-06-14 DIAGNOSIS — Z7982 Long term (current) use of aspirin: Secondary | ICD-10-CM

## 2015-06-14 DIAGNOSIS — I252 Old myocardial infarction: Secondary | ICD-10-CM

## 2015-06-14 DIAGNOSIS — R0902 Hypoxemia: Secondary | ICD-10-CM

## 2015-06-14 DIAGNOSIS — E872 Acidosis: Secondary | ICD-10-CM | POA: Diagnosis present

## 2015-06-14 DIAGNOSIS — R000 Tachycardia, unspecified: Secondary | ICD-10-CM

## 2015-06-14 DIAGNOSIS — R0682 Tachypnea, not elsewhere classified: Secondary | ICD-10-CM

## 2015-06-14 DIAGNOSIS — J440 Chronic obstructive pulmonary disease with acute lower respiratory infection: Principal | ICD-10-CM | POA: Diagnosis present

## 2015-06-14 DIAGNOSIS — Z95810 Presence of automatic (implantable) cardiac defibrillator: Secondary | ICD-10-CM

## 2015-06-14 DIAGNOSIS — R0602 Shortness of breath: Secondary | ICD-10-CM | POA: Diagnosis not present

## 2015-06-14 DIAGNOSIS — T801XXA Vascular complications following infusion, transfusion and therapeutic injection, initial encounter: Secondary | ICD-10-CM

## 2015-06-14 DIAGNOSIS — E785 Hyperlipidemia, unspecified: Secondary | ICD-10-CM | POA: Diagnosis present

## 2015-06-14 DIAGNOSIS — J189 Pneumonia, unspecified organism: Secondary | ICD-10-CM

## 2015-06-14 DIAGNOSIS — I5022 Chronic systolic (congestive) heart failure: Secondary | ICD-10-CM | POA: Diagnosis present

## 2015-06-14 DIAGNOSIS — Z955 Presence of coronary angioplasty implant and graft: Secondary | ICD-10-CM

## 2015-06-14 DIAGNOSIS — F17210 Nicotine dependence, cigarettes, uncomplicated: Secondary | ICD-10-CM | POA: Diagnosis present

## 2015-06-14 HISTORY — DX: Pneumonia, unspecified organism: J18.9

## 2015-06-14 HISTORY — DX: Dependence on supplemental oxygen: Z99.81

## 2015-06-14 HISTORY — DX: Heart failure, unspecified: I50.9

## 2015-06-14 LAB — I-STAT TROPONIN, ED: Troponin i, poc: 0.03 ng/mL (ref 0.00–0.08)

## 2015-06-14 LAB — COMPREHENSIVE METABOLIC PANEL
ALBUMIN: 3.4 g/dL — AB (ref 3.5–5.0)
ALK PHOS: 65 U/L (ref 38–126)
ALT: 17 U/L (ref 17–63)
ANION GAP: 13 (ref 5–15)
AST: 22 U/L (ref 15–41)
BUN: 17 mg/dL (ref 6–20)
CALCIUM: 9.5 mg/dL (ref 8.9–10.3)
CHLORIDE: 100 mmol/L — AB (ref 101–111)
CO2: 19 mmol/L — AB (ref 22–32)
Creatinine, Ser: 1.01 mg/dL (ref 0.61–1.24)
GFR calc non Af Amer: >60 mL/min (ref >60)
GLUCOSE: 119 mg/dL — AB (ref 65–99)
Potassium: 5.1 mmol/L (ref 3.5–5.1)
SODIUM: 132 mmol/L — AB (ref 135–145)
Total Bilirubin: 0.9 mg/dL (ref 0.3–1.2)
Total Protein: 9.5 g/dL — ABNORMAL HIGH (ref 6.5–8.1)

## 2015-06-14 LAB — CBC
HEMATOCRIT: 45.2 % (ref 39.0–52.0)
Hemoglobin: 15.9 g/dL (ref 13.0–17.0)
MCH: 29.3 pg (ref 26.0–34.0)
MCHC: 35.2 g/dL (ref 30.0–36.0)
MCV: 83.4 fL (ref 78.0–100.0)
PLATELETS: 270 10*3/uL (ref 150–400)
RBC: 5.42 MIL/uL (ref 4.22–5.81)
RDW: 14.3 % (ref 11.5–15.5)
WBC: 10.3 10*3/uL (ref 4.0–10.5)

## 2015-06-14 LAB — URINALYSIS, ROUTINE W REFLEX MICROSCOPIC
Bilirubin Urine: NEGATIVE
Glucose, UA: NEGATIVE mg/dL
HGB URINE DIPSTICK: NEGATIVE
Ketones, ur: NEGATIVE mg/dL
NITRITE: NEGATIVE
PROTEIN: 30 mg/dL — AB
SPECIFIC GRAVITY, URINE: 1.018 (ref 1.005–1.030)
pH: 5 (ref 5.0–8.0)

## 2015-06-14 LAB — URINE MICROSCOPIC-ADD ON

## 2015-06-14 LAB — I-STAT CG4 LACTIC ACID, ED
LACTIC ACID, VENOUS: 1.88 mmol/L (ref 0.5–2.0)
Lactic Acid, Venous: 2.01 mmol/L (ref 0.5–2.0)

## 2015-06-14 LAB — BRAIN NATRIURETIC PEPTIDE: B Natriuretic Peptide: 120.2 pg/mL — ABNORMAL HIGH (ref 0.0–100.0)

## 2015-06-14 MED ORDER — LORAZEPAM 2 MG/ML IJ SOLN
1.0000 mg | Freq: Once | INTRAMUSCULAR | Status: AC
  Administered 2015-06-14: 1 mg via INTRAVENOUS
  Filled 2015-06-14: qty 1

## 2015-06-14 MED ORDER — IOHEXOL 350 MG/ML SOLN
100.0000 mL | Freq: Once | INTRAVENOUS | Status: AC | PRN
  Administered 2015-06-15: 70 mL via INTRAVENOUS

## 2015-06-14 MED ORDER — METHYLPREDNISOLONE SODIUM SUCC 125 MG IJ SOLR
125.0000 mg | Freq: Once | INTRAMUSCULAR | Status: AC
  Administered 2015-06-15: 125 mg via INTRAVENOUS
  Filled 2015-06-14: qty 2

## 2015-06-14 MED ORDER — DEXTROSE 5 % IV SOLN
1.0000 g | Freq: Once | INTRAVENOUS | Status: AC
  Administered 2015-06-15: 1 g via INTRAVENOUS
  Filled 2015-06-14: qty 10

## 2015-06-14 MED ORDER — IPRATROPIUM-ALBUTEROL 0.5-2.5 (3) MG/3ML IN SOLN
3.0000 mL | Freq: Once | RESPIRATORY_TRACT | Status: AC
  Administered 2015-06-14: 3 mL via RESPIRATORY_TRACT
  Filled 2015-06-14: qty 3

## 2015-06-14 MED ORDER — SODIUM CHLORIDE 0.9 % IV BOLUS (SEPSIS)
1000.0000 mL | Freq: Once | INTRAVENOUS | Status: AC
  Administered 2015-06-14: 1000 mL via INTRAVENOUS

## 2015-06-14 MED ORDER — DEXTROSE 5 % IV SOLN
500.0000 mg | Freq: Once | INTRAVENOUS | Status: AC
  Administered 2015-06-15: 500 mg via INTRAVENOUS
  Filled 2015-06-14: qty 500

## 2015-06-14 NOTE — ED Notes (Signed)
Pt c/o shortness of breath onset FRiday nite. Pt pulse ox 87% in triage. Pt reports that he is supposed to have home O2 but moved and has not had tanks delivered to him.

## 2015-06-14 NOTE — ED Notes (Signed)
Pt brought back from CT d/t IV infiltrated, hot pack on pt right arm.

## 2015-06-14 NOTE — ED Notes (Signed)
Called CT to advise IV has been reinserted.

## 2015-06-14 NOTE — ED Notes (Signed)
Lab to add on BNP.  °
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
## 2015-06-15 ENCOUNTER — Ambulatory Visit (HOSPITAL_COMMUNITY): Payer: Medicare Other

## 2015-06-15 ENCOUNTER — Encounter (HOSPITAL_COMMUNITY): Payer: Self-pay | Admitting: General Practice

## 2015-06-15 DIAGNOSIS — E872 Acidosis: Secondary | ICD-10-CM | POA: Diagnosis present

## 2015-06-15 DIAGNOSIS — Z7982 Long term (current) use of aspirin: Secondary | ICD-10-CM | POA: Diagnosis not present

## 2015-06-15 DIAGNOSIS — E785 Hyperlipidemia, unspecified: Secondary | ICD-10-CM | POA: Diagnosis present

## 2015-06-15 DIAGNOSIS — E875 Hyperkalemia: Secondary | ICD-10-CM | POA: Diagnosis present

## 2015-06-15 DIAGNOSIS — J440 Chronic obstructive pulmonary disease with acute lower respiratory infection: Secondary | ICD-10-CM | POA: Diagnosis present

## 2015-06-15 DIAGNOSIS — C3431 Malignant neoplasm of lower lobe, right bronchus or lung: Secondary | ICD-10-CM | POA: Diagnosis present

## 2015-06-15 DIAGNOSIS — J189 Pneumonia, unspecified organism: Secondary | ICD-10-CM | POA: Diagnosis present

## 2015-06-15 DIAGNOSIS — F17210 Nicotine dependence, cigarettes, uncomplicated: Secondary | ICD-10-CM | POA: Diagnosis present

## 2015-06-15 DIAGNOSIS — I5022 Chronic systolic (congestive) heart failure: Secondary | ICD-10-CM | POA: Diagnosis present

## 2015-06-15 DIAGNOSIS — R0602 Shortness of breath: Secondary | ICD-10-CM | POA: Diagnosis not present

## 2015-06-15 DIAGNOSIS — I252 Old myocardial infarction: Secondary | ICD-10-CM | POA: Diagnosis not present

## 2015-06-15 DIAGNOSIS — Z95810 Presence of automatic (implantable) cardiac defibrillator: Secondary | ICD-10-CM | POA: Diagnosis not present

## 2015-06-15 DIAGNOSIS — I509 Heart failure, unspecified: Secondary | ICD-10-CM | POA: Diagnosis not present

## 2015-06-15 DIAGNOSIS — I255 Ischemic cardiomyopathy: Secondary | ICD-10-CM | POA: Diagnosis present

## 2015-06-15 DIAGNOSIS — I2510 Atherosclerotic heart disease of native coronary artery without angina pectoris: Secondary | ICD-10-CM | POA: Diagnosis present

## 2015-06-15 DIAGNOSIS — Z79899 Other long term (current) drug therapy: Secondary | ICD-10-CM | POA: Diagnosis not present

## 2015-06-15 DIAGNOSIS — Z955 Presence of coronary angioplasty implant and graft: Secondary | ICD-10-CM | POA: Diagnosis not present

## 2015-06-15 LAB — COMPREHENSIVE METABOLIC PANEL
ALBUMIN: 2.9 g/dL — AB (ref 3.5–5.0)
ALT: 15 U/L — ABNORMAL LOW (ref 17–63)
ANION GAP: 6 (ref 5–15)
AST: 17 U/L (ref 15–41)
Alkaline Phosphatase: 56 U/L (ref 38–126)
BILIRUBIN TOTAL: 0.3 mg/dL (ref 0.3–1.2)
BUN: 14 mg/dL (ref 6–20)
CHLORIDE: 108 mmol/L (ref 101–111)
CO2: 20 mmol/L — ABNORMAL LOW (ref 22–32)
Calcium: 8.3 mg/dL — ABNORMAL LOW (ref 8.9–10.3)
Creatinine, Ser: 0.84 mg/dL (ref 0.61–1.24)
GFR calc Af Amer: >60 mL/min (ref >60)
GFR calc non Af Amer: >60 mL/min (ref >60)
GLUCOSE: 152 mg/dL — AB (ref 65–99)
POTASSIUM: 5.3 mmol/L — AB (ref 3.5–5.1)
SODIUM: 134 mmol/L — AB (ref 135–145)
TOTAL PROTEIN: 8 g/dL (ref 6.5–8.1)

## 2015-06-15 LAB — CBC WITH DIFFERENTIAL/PLATELET
BASOS ABS: 0 10*3/uL (ref 0.0–0.1)
BASOS PCT: 0 %
EOS PCT: 0 %
Eosinophils Absolute: 0 10*3/uL (ref 0.0–0.7)
HEMATOCRIT: 38.8 % — AB (ref 39.0–52.0)
Hemoglobin: 13.5 g/dL (ref 13.0–17.0)
Lymphocytes Relative: 9 %
Lymphs Abs: 0.9 10*3/uL (ref 0.7–4.0)
MCH: 29.1 pg (ref 26.0–34.0)
MCHC: 34.8 g/dL (ref 30.0–36.0)
MCV: 83.6 fL (ref 78.0–100.0)
MONO ABS: 0.3 10*3/uL (ref 0.1–1.0)
Monocytes Relative: 3 %
NEUTROS ABS: 8.5 10*3/uL — AB (ref 1.7–7.7)
Neutrophils Relative %: 88 %
PLATELETS: 261 10*3/uL (ref 150–400)
RBC: 4.64 MIL/uL (ref 4.22–5.81)
RDW: 14.3 % (ref 11.5–15.5)
WBC: 9.8 10*3/uL (ref 4.0–10.5)

## 2015-06-15 LAB — URINE CULTURE

## 2015-06-15 LAB — PHOSPHORUS: PHOSPHORUS: 2.7 mg/dL (ref 2.5–4.6)

## 2015-06-15 LAB — STREP PNEUMONIAE URINARY ANTIGEN: STREP PNEUMO URINARY ANTIGEN: NEGATIVE

## 2015-06-15 MED ORDER — AZITHROMYCIN 500 MG PO TABS
500.0000 mg | ORAL_TABLET | Freq: Every day | ORAL | Status: DC
  Administered 2015-06-15 – 2015-06-16 (×2): 500 mg via ORAL
  Filled 2015-06-15 (×4): qty 1

## 2015-06-15 MED ORDER — ONDANSETRON HCL 4 MG PO TABS: 4.0000 mg | ORAL_TABLET | Freq: Four times a day (QID) | ORAL | Status: DC | PRN

## 2015-06-15 MED ORDER — ASPIRIN EC 81 MG PO TBEC
81.0000 mg | DELAYED_RELEASE_TABLET | Freq: Every day | ORAL | Status: DC
  Administered 2015-06-15 – 2015-06-17 (×3): 81 mg via ORAL
  Filled 2015-06-15 (×3): qty 1

## 2015-06-15 MED ORDER — ENOXAPARIN SODIUM 40 MG/0.4ML ~~LOC~~ SOLN
40.0000 mg | SUBCUTANEOUS | Status: DC
Start: 2015-06-15 — End: 2015-06-17
  Filled 2015-06-15 (×3): qty 0.4

## 2015-06-15 MED ORDER — MOMETASONE FURO-FORMOTEROL FUM 100-5 MCG/ACT IN AERO
2.0000 | INHALATION_SPRAY | Freq: Two times a day (BID) | RESPIRATORY_TRACT | Status: DC
  Administered 2015-06-15 – 2015-06-17 (×4): 2 via RESPIRATORY_TRACT
  Filled 2015-06-15: qty 8.8

## 2015-06-15 MED ORDER — CEFTRIAXONE SODIUM 1 G IJ SOLR
1.0000 g | INTRAMUSCULAR | Status: DC
  Administered 2015-06-15 – 2015-06-16 (×2): 1 g via INTRAVENOUS
  Filled 2015-06-15 (×3): qty 10

## 2015-06-15 MED ORDER — CARVEDILOL 3.125 MG PO TABS
3.1250 mg | ORAL_TABLET | Freq: Two times a day (BID) | ORAL | Status: DC
  Administered 2015-06-15 – 2015-06-17 (×5): 3.125 mg via ORAL
  Filled 2015-06-15 (×8): qty 1

## 2015-06-15 MED ORDER — ALBUTEROL SULFATE (2.5 MG/3ML) 0.083% IN NEBU: 2.5000 mg | INHALATION_SOLUTION | RESPIRATORY_TRACT | Status: DC | PRN

## 2015-06-15 MED ORDER — SODIUM CHLORIDE 0.9 % IV BOLUS (SEPSIS)
1000.0000 mL | Freq: Once | INTRAVENOUS | Status: AC
  Administered 2015-06-15: 1000 mL via INTRAVENOUS

## 2015-06-15 MED ORDER — MAGNESIUM SULFATE 2 GM/50ML IV SOLN
2.0000 g | Freq: Once | INTRAVENOUS | Status: AC
  Administered 2015-06-15: 2 g via INTRAVENOUS
  Filled 2015-06-15: qty 50

## 2015-06-15 MED ORDER — SODIUM CHLORIDE 0.9 % IV SOLN
INTRAVENOUS | Status: DC
Start: 2015-06-15 — End: 2015-06-15
  Administered 2015-06-15: 08:00:00 via INTRAVENOUS

## 2015-06-15 MED ORDER — SIMVASTATIN 40 MG PO TABS
40.0000 mg | ORAL_TABLET | Freq: Every day | ORAL | Status: DC
  Administered 2015-06-15 – 2015-06-16 (×2): 40 mg via ORAL
  Filled 2015-06-15 (×3): qty 1

## 2015-06-15 MED ORDER — EZETIMIBE 10 MG PO TABS
10.0000 mg | ORAL_TABLET | Freq: Every day | ORAL | Status: DC
  Administered 2015-06-15 – 2015-06-17 (×3): 10 mg via ORAL
  Filled 2015-06-15 (×4): qty 1

## 2015-06-15 MED ORDER — TIOTROPIUM BROMIDE MONOHYDRATE 18 MCG IN CAPS
18.0000 ug | ORAL_CAPSULE | Freq: Every day | RESPIRATORY_TRACT | Status: DC
  Administered 2015-06-16 – 2015-06-17 (×2): 18 ug via RESPIRATORY_TRACT
  Filled 2015-06-15: qty 5

## 2015-06-15 MED ORDER — SODIUM CHLORIDE 0.9 % IJ SOLN: 3.0000 mL | INTRAMUSCULAR | Status: DC | PRN

## 2015-06-15 MED ORDER — IPRATROPIUM-ALBUTEROL 0.5-2.5 (3) MG/3ML IN SOLN
3.0000 mL | Freq: Four times a day (QID) | RESPIRATORY_TRACT | Status: DC
  Administered 2015-06-15 – 2015-06-17 (×10): 3 mL via RESPIRATORY_TRACT
  Filled 2015-06-15 (×10): qty 3

## 2015-06-15 MED ORDER — SODIUM CHLORIDE 0.9 % IV SOLN: 250.0000 mL | INTRAVENOUS | Status: DC | PRN

## 2015-06-15 MED ORDER — SODIUM CHLORIDE 0.9 % IJ SOLN
3.0000 mL | Freq: Two times a day (BID) | INTRAMUSCULAR | Status: DC
  Administered 2015-06-15 – 2015-06-16 (×3): 3 mL via INTRAVENOUS

## 2015-06-15 MED ORDER — LISINOPRIL 5 MG PO TABS
5.0000 mg | ORAL_TABLET | Freq: Every day | ORAL | Status: DC
  Administered 2015-06-15: 5 mg via ORAL
  Filled 2015-06-15 (×2): qty 1

## 2015-06-15 MED ORDER — SODIUM CHLORIDE 0.9 % IJ SOLN
3.0000 mL | Freq: Two times a day (BID) | INTRAMUSCULAR | Status: DC
  Administered 2015-06-15 – 2015-06-17 (×3): 3 mL via INTRAVENOUS

## 2015-06-15 MED ORDER — DEXTROSE 5 % IV SOLN
500.0000 mg | INTRAVENOUS | Status: DC
  Filled 2015-06-15: qty 500

## 2015-06-15 MED ORDER — ONDANSETRON HCL 4 MG/2ML IJ SOLN: 4.0000 mg | Freq: Four times a day (QID) | INTRAMUSCULAR | Status: DC | PRN

## 2015-06-15 NOTE — ED Notes (Signed)
Attempted report 
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
## 2015-06-15 NOTE — ED Notes (Signed)
Pt work of breathing has inc, NRB placed on pt, pt appears to be calming down. Instructed pt to take slow deep breaths

## 2015-06-15 NOTE — Progress Notes (Signed)
  Echocardiogram 2D Echocardiogram has been performed.  [name] 06/15/2015, 3:21 PM

## 2015-06-15 NOTE — Progress Notes (Signed)
Parrived in CT Dept at 2050. Pt had 20g in RAC iv was flushed with 50m hand injected then flused with 451miv held up. Started injection pt yelled and waved I stopped injection. 5212mmni 350 infiltrated into pts RAC. Pt stated his rt arm hurt. Radiologist Dr. ChaRadene Kneeme to CT to check on pt. Ice pack was placed on infiltrated site and pt was transported back to E37. RN CarMaryfrances Bunnells notified. Dr. ChaRadene Kneeoke with EriGareth MorganD. Dr. SchBilly Fischerated that they would call iv team for a new iv site to repeat exam.t

## 2015-06-15 NOTE — Progress Notes (Signed)
Patient admitted after midnight. Chart reviewed. Patient examined. Feeling better. Continue abx. Likely home soon.  Doree Barthel, MD [facility] [email or web address] password [facility]

## 2015-06-15 NOTE — Progress Notes (Signed)
Utilization review completed.  

## 2015-06-16 DIAGNOSIS — E875 Hyperkalemia: Secondary | ICD-10-CM

## 2015-06-16 DIAGNOSIS — E876 Hypokalemia: Secondary | ICD-10-CM | POA: Diagnosis present

## 2015-06-16 LAB — CBC WITH DIFFERENTIAL/PLATELET
BASOS ABS: 0 10*3/uL (ref 0.0–0.1)
Basophils Relative: 0 %
EOS ABS: 0 10*3/uL (ref 0.0–0.7)
EOS PCT: 0 %
HCT: 38.8 % — ABNORMAL LOW (ref 39.0–52.0)
Hemoglobin: 13.6 g/dL (ref 13.0–17.0)
LYMPHS ABS: 2.2 10*3/uL (ref 0.7–4.0)
LYMPHS PCT: 13 %
MCH: 29.2 pg (ref 26.0–34.0)
MCHC: 35.1 g/dL (ref 30.0–36.0)
MCV: 83.4 fL (ref 78.0–100.0)
MONO ABS: 1.6 10*3/uL — AB (ref 0.1–1.0)
Monocytes Relative: 9 %
Neutro Abs: 13.2 10*3/uL — ABNORMAL HIGH (ref 1.7–7.7)
Neutrophils Relative %: 78 %
PLATELETS: 261 10*3/uL (ref 150–400)
RBC: 4.65 MIL/uL (ref 4.22–5.81)
RDW: 14.5 % (ref 11.5–15.5)
WBC: 17 10*3/uL — ABNORMAL HIGH (ref 4.0–10.5)

## 2015-06-16 LAB — COMPREHENSIVE METABOLIC PANEL
ALT: 15 U/L — ABNORMAL LOW (ref 17–63)
AST: 24 U/L (ref 15–41)
Albumin: 2.7 g/dL — ABNORMAL LOW (ref 3.5–5.0)
Alkaline Phosphatase: 55 U/L (ref 38–126)
Anion gap: 7 (ref 5–15)
BILIRUBIN TOTAL: 0.5 mg/dL (ref 0.3–1.2)
BUN: 16 mg/dL (ref 6–20)
CO2: 23 mmol/L (ref 22–32)
CREATININE: 0.71 mg/dL (ref 0.61–1.24)
Calcium: 9.2 mg/dL (ref 8.9–10.3)
Chloride: 103 mmol/L (ref 101–111)
GLUCOSE: 115 mg/dL — AB (ref 65–99)
POTASSIUM: 5.8 mmol/L — AB (ref 3.5–5.1)
Sodium: 133 mmol/L — ABNORMAL LOW (ref 135–145)
TOTAL PROTEIN: 8.3 g/dL — AB (ref 6.5–8.1)

## 2015-06-16 LAB — LEGIONELLA ANTIGEN, URINE

## 2015-06-16 MED ORDER — ACETAMINOPHEN 325 MG PO TABS
650.0000 mg | ORAL_TABLET | Freq: Four times a day (QID) | ORAL | Status: DC | PRN
  Administered 2015-06-16: 650 mg via ORAL
  Filled 2015-06-16: qty 2

## 2015-06-16 MED ORDER — SODIUM POLYSTYRENE SULFONATE 15 GM/60ML PO SUSP
30.0000 g | Freq: Once | ORAL | Status: AC
  Administered 2015-06-16: 30 g via ORAL
  Filled 2015-06-16: qty 120

## 2015-06-16 NOTE — Progress Notes (Addendum)
[facility] PROGRESS NOTE  [name] BEM:[id number] DOB: Jun 08, 1960 DOA: 06/14/2015 PCP: Elyn Peers, MD  Summary: 45 male with h/o CAD, ICM, lung ca s/p wedge resection, copd, chronic hypoxic respiratory failure admitted with pneumonia (seen on CTA chest)  Assessment/Plan:  Principal Problem:   CAP (community acquired pneumonia): continue rocephin, azithromycin. Urine strep pneumonia, legionella ag neg.  Lactic acidosis cleared. Mobilize. Active Problems:   Coronary artery disease   Hyperlipidemia   Adenocarcinoma of lung, stage 1, s/p wedge resection   Chronic systolic heart failure ([place]): BNP just over 100. Clinically compensated. Echo with LV EF 07-12% and RV systolic function reduced. Continue coreg. Hold ACE due to worsening hyperkalemia.   Other emphysema ([place]) with chronic respiratory failure. No longer has home oxygen, as he moved. Will need to arrange at discharge   Hyperkalemia: K up to 5.8 today. Hold lisinopril. Give a dose of kayexalate and recheck in am   Code Status:  full Family Communication:  Pt is lucid Disposition Plan:  Home 1-2 days if stable  Consultants:    Procedures:     Antibiotics:  Rocephin, azithromycin  HPI/Subjective: Breathing easier. Has not walked yet. Cough improved  Objective: Filed Vitals:   06/16/15 0241 06/16/15 0441  BP:  108/55  Pulse: 72 89  Temp:  98.1 F (36.7 C)  Resp: 18 18    Intake/Output Summary (Last 24 hours) at 06/16/15 1118 Last data filed at 06/16/15 0900  Gross per 24 hour  Intake   1370 ml  Output    300 ml  Net   1070 ml   Filed Weights   06/14/15 1822 06/14/15 1845 06/16/15 0258  Weight: 56.7 kg (125 lb) 53.184 kg (117 lb 4 oz) 53.661 kg (118 lb 4.8 oz)    Exam:   General:  A and o. comfortable  Cardiovascular: RRR without MGR  Respiratory: CTA without WRR  Abdomen: S, NT, ND  Ext: no CCE  Basic Metabolic Panel:  Recent Labs Lab 06/14/15 1835 06/15/15 0510  06/16/15 0240  NA 132* 134* 133*  K 5.1 5.3* 5.8*  CL 100* 108 103  CO2 19* 20* 23  GLUCOSE 119* 152* 115*  BUN '17 14 16  '$ CREATININE 1.01 0.84 0.71  CALCIUM 9.5 8.3* 9.2  PHOS  --  2.7  --    Liver Function Tests:  Recent Labs Lab 06/14/15 1835 06/15/15 0510 06/16/15 0240  AST '22 17 24  '$ ALT 17 15* 15*  ALKPHOS 65 56 55  BILITOT 0.9 0.3 0.5  PROT 9.5* 8.0 8.3*  ALBUMIN 3.4* 2.9* 2.7*   No results for input(s): LIPASE, AMYLASE in the last 168 hours. No results for input(s): AMMONIA in the last 168 hours. CBC:  Recent Labs Lab 06/14/15 1835 06/15/15 0510 06/16/15 0240  WBC 10.3 9.8 17.0*  NEUTROABS  --  8.5* 13.2*  HGB 15.9 13.5 13.6  HCT 45.2 38.8* 38.8*  MCV 83.4 83.6 83.4  PLT 270 261 261   Cardiac Enzymes: No results for input(s): CKTOTAL, CKMB, CKMBINDEX, TROPONINI in the last 168 hours. BNP (last 3 results)  Recent Labs  11/18/14 0029 06/14/15 1835  BNP 128.5* 120.2*    ProBNP (last 3 results) No results for input(s): PROBNP in the last 8760 hours.  CBG: No results for input(s): GLUCAP in the last 168 hours.  Recent Results (from the past 240 hour(s))  Culture, blood (routine x 2)     Status: None (Preliminary result)   Collection Time: 06/14/15  6:44  PM  Result Value Ref Range Status   Specimen Description BLOOD LEFT ARM  Final   Special Requests BOTTLES DRAWN AEROBIC ONLY 5CC  Final   Culture NO GROWTH < 24 HOURS  Final   Report Status PENDING  Incomplete  Urine culture     Status: None   Collection Time: 06/14/15  7:43 PM  Result Value Ref Range Status   Specimen Description URINE, CLEAN CATCH  Final   Special Requests NONE  Final   Culture MULTIPLE SPECIES PRESENT, SUGGEST RECOLLECTION  Final   Report Status 06/15/2015 FINAL  Final  Culture, blood (routine x 2)     Status: None (Preliminary result)   Collection Time: 06/14/15  8:40 PM  Result Value Ref Range Status   Specimen Description BLOOD LEFT ANTECUBITAL  Final   Special  Requests BOTTLES DRAWN AEROBIC ONLY 5CC  Final   Culture NO GROWTH < 24 HOURS  Final   Report Status PENDING  Incomplete     Studies: Dg Chest 2 View  06/14/2015  CLINICAL DATA:  Shortness of breath for 2 days. Home oxygen. History of pulmonary wedge resection for lung cancer. EXAM: CHEST  2 VIEW COMPARISON:  CT 04/14/2015.  Radiographs 11/18/2014. FINDINGS: Left subclavian AICD appears unchanged at the right ventricular apex. The heart size and mediastinal contours are stable status post median sternotomy. Coronary artery stents are noted. There is chronic volume loss and opacity in the left upper hemithorax with underlying bronchiectasis. Scattered scarring is present elsewhere in the lungs, also grossly stable. The nodularity noted along the suture line on recent CT is not apparent. No superimposed airspace disease, edema or significant pleural effusion identified. IMPRESSION: Radiographically stable appearance of the chest with chronic findings as described. No acute superimposed findings evident. Electronically Signed   By: Richardean Sale M.D.   On: 06/14/2015 19:31   Ct Angio Chest Pe W/cm &/or Wo Cm  06/15/2015  CLINICAL DATA:  Acute onset of shortness of breath. Decreased O2 saturation. Initial encounter. EXAM: CT ANGIOGRAPHY CHEST WITH CONTRAST TECHNIQUE: Multidetector CT imaging of the chest was performed using the standard protocol during bolus administration of intravenous contrast. Multiplanar CT image reconstructions and MIPs were obtained to evaluate the vascular anatomy. CONTRAST:  74m OMNIPAQUE IOHEXOL 350 MG/ML SOLN COMPARISON:  Chest radiograph performed earlier today at 7:01 p.m., and CT of the chest performed 04/14/2015 FINDINGS: There is no evidence of pulmonary embolus. Marked scarring is again noted at the left upper lobe, with more mild scarring at the right upper lobe. This appearance is stable from the prior CT. Additional scarring and underlying emphysematous change are noted  bilaterally. Underlying new patchy bibasilar airspace opacities are concerning for pneumonia. Nodularity along the right lower lobe wedge resection is relatively stable from the recent prior study, though as before, recurrent malignancy cannot be excluded. There is no evidence of pleural effusion or pneumothorax. Nodularity about the hila is grossly stable, though somewhat better characterized given contrast. This may simply reflect scarring, though underlying hilar nodes are in concern. The patient is status post median sternotomy. No axillary lymphadenopathy is seen. The thyroid gland is unremarkable in appearance. A left-sided AICD is noted. The visualized portions of the liver and spleen are unremarkable. The visualized portions of the pancreas, gallbladder, stomach, adrenal glands and right kidney are within normal limits. A 1.4 cm cyst is noted at the interpole region of the left kidney. No acute osseous abnormalities are seen. Review of the MIP images confirms the above  findings. IMPRESSION: 1. No evidence of pulmonary embolus. 2. Patchy bibasilar airspace opacities are concerning for pneumonia, new from the prior study. 3. Marked scarring at the left upper lobe, with more mild scarring at the right upper lobe, stable from the prior study. Additional scarring and underlying emphysematous change noted bilaterally. 4. Nodularity along the right lower lobe wedge resection is relatively stable from the recent prior study, though as before, recurrent malignancy cannot be excluded. 5. Nodularity about the hila is grossly stable. This may simply reflect scarring, though underlying prominent hilar nodes are a concern. 6. Left renal cyst noted. Electronically Signed   By: Garald Balding M.D.   On: 06/15/2015 01:31   Echo Left ventricle: The cavity size was normal. Wall thickness was increased in a pattern of mild LVH. Systolic function was severely reduced. The estimated ejection fraction was in the range  of 20% to 25%. There is severe hypokinesis of the mid-apicalanteroseptal, inferior, and apical myocardium. Doppler parameters are consistent with abnormal left ventricular relaxation (grade 1 diastolic dysfunction). - Right ventricle: Systolic function was mildly to moderately reduced. - Tricuspid valve: There was moderate regurgitation. - Pulmonary arteries: PA peak pressure: 42 mm Hg (S). Scheduled Meds: . aspirin EC  81 mg Oral Daily  . azithromycin  500 mg Oral Daily  . carvedilol  3.125 mg Oral BID WC  . cefTRIAXone (ROCEPHIN)  IV  1 g Intravenous Q24H  . enoxaparin (LOVENOX) injection  40 mg Subcutaneous Q24H  . ezetimibe  10 mg Oral Daily  . ipratropium-albuterol  3 mL Nebulization Q6H  . mometasone-formoterol  2 puff Inhalation BID  . simvastatin  40 mg Oral QHS  . sodium chloride  3 mL Intravenous Q12H  . sodium chloride  3 mL Intravenous Q12H  . tiotropium  18 mcg Inhalation Daily   Continuous Infusions:   Time spent: 25 minutes  [facility]  [email or web address], password [facility] 06/16/2015, 11:18 AM  LOS: 1 day

## 2015-06-17 DIAGNOSIS — J189 Pneumonia, unspecified organism: Secondary | ICD-10-CM

## 2015-06-17 LAB — BASIC METABOLIC PANEL
ANION GAP: 10 (ref 5–15)
BUN: 10 mg/dL (ref 6–20)
CALCIUM: 9.2 mg/dL (ref 8.9–10.3)
CO2: 25 mmol/L (ref 22–32)
Chloride: 105 mmol/L (ref 101–111)
Creatinine, Ser: 0.75 mg/dL (ref 0.61–1.24)
GFR calc Af Amer: >60 mL/min (ref >60)
GLUCOSE: 91 mg/dL (ref 65–99)
POTASSIUM: 4.2 mmol/L (ref 3.5–5.1)
SODIUM: 140 mmol/L (ref 135–145)

## 2015-06-17 LAB — CBC
HEMATOCRIT: 38.9 % — AB (ref 39.0–52.0)
HEMOGLOBIN: 13.3 g/dL (ref 13.0–17.0)
MCH: 29.2 pg (ref 26.0–34.0)
MCHC: 34.2 g/dL (ref 30.0–36.0)
MCV: 85.5 fL (ref 78.0–100.0)
Platelets: 294 10*3/uL (ref 150–400)
RBC: 4.55 MIL/uL (ref 4.22–5.81)
RDW: 14.8 % (ref 11.5–15.5)
WBC: 10.5 10*3/uL (ref 4.0–10.5)

## 2015-06-17 MED ORDER — CEFDINIR 300 MG PO CAPS: 300.0000 mg | ORAL_CAPSULE | Freq: Two times a day (BID) | ORAL | Status: DC

## 2015-06-17 MED ORDER — AZITHROMYCIN 500 MG PO TABS: 500.0000 mg | ORAL_TABLET | Freq: Every day | ORAL | Status: DC

## 2015-06-17 MED ORDER — AZITHROMYCIN 500 MG PO TABS
500.0000 mg | ORAL_TABLET | Freq: Every day | ORAL | Status: DC
Start: 2015-06-17 — End: 2015-06-17

## 2015-06-17 NOTE — Progress Notes (Signed)
Patient ambulated in hallway 250 feet x1 assist. Will monitor patient. Zaydan Papesh, Bettina Gavia RN

## 2015-06-17 NOTE — Discharge Summary (Addendum)
Physician Discharge Summary  Zakir Axtman MRN: [id number] DOB/AGE: 54/02/1961 54 y.o.  PCP: BLAND,VEITA J, MD   Admit date: 06/14/2015 Discharge date: 06/17/2015  Discharge Diagnoses:     Principal Problem:   CAP (community acquired pneumonia) Active Problems:   Coronary artery disease   Hyperlipidemia   Adenocarcinoma of lung, stage 1, 12mm RLL paraspinal mass   Chronic systolic heart failure (HCC)   Other emphysema (HCC)   Hyperkalemia    Follow-up recommendations Follow-up with PCP in 3-5 days , including all  additional recommended appointments as below Follow-up CBC, CMP in 3-5 days Can restart ACE when renal function and potassium are rechecked and stable       Medication List    STOP taking these medications        lisinopril 5 MG tablet  Commonly known as:  PRINIVIL,ZESTRIL      TAKE these medications        ADVAIR DISKUS 100-50 MCG/DOSE Aepb  Generic drug:  Fluticasone-Salmeterol  INHALE CONTENTS OF 1 BLISTER USING DISKUS TWO TIMES DAILY     aspirin EC 81 MG tablet  Take 1 tablet (81 mg total) by mouth daily.     azithromycin 500 MG tablet  Commonly known as:  ZITHROMAX  Take 1 tablet (500 mg total) by mouth daily.     carvedilol 3.125 MG tablet  Commonly known as:  COREG  Take 1 tablet (3.125 mg total) by mouth 2 (two) times daily with a meal.     cefdinir 300 MG capsule  Commonly known as:  OMNICEF  Take 1 capsule (300 mg total) by mouth 2 (two) times daily.     ezetimibe 10 MG tablet  Commonly known as:  ZETIA  Take 1 tablet (10 mg total) by mouth daily.     simvastatin 40 MG tablet  Commonly known as:  ZOCOR  Take 1 tablet (40 mg total) by mouth at bedtime.     SPIRIVA HANDIHALER 18 MCG inhalation capsule  Generic drug:  tiotropium  PLACE 1 CAPSULE INTO HANDIHALER AND INHALE DAILY         Discharge Condition: Stable   Discharge Instructions       Discharge Instructions    Diet - low sodium heart healthy    Complete by:   As directed      Increase activity slowly    Complete by:  As directed            No Known Allergies    Disposition: 01-Home or Self Care   Consults:  None   Significant Diagnostic Studies:  Dg Chest 2 View  06/14/2015  CLINICAL DATA:  Shortness of breath for 2 days. Home oxygen. History of pulmonary wedge resection for lung cancer. EXAM: CHEST  2 VIEW COMPARISON:  CT 04/14/2015.  Radiographs 11/18/2014. FINDINGS: Left subclavian AICD appears unchanged at the right ventricular apex. The heart size and mediastinal contours are stable status post median sternotomy. Coronary artery stents are noted. There is chronic volume loss and opacity in the left upper hemithorax with underlying bronchiectasis. Scattered scarring is present elsewhere in the lungs, also grossly stable. The nodularity noted along the suture line on recent CT is not apparent. No superimposed airspace disease, edema or significant pleural effusion identified. IMPRESSION: Radiographically stable appearance of the chest with chronic findings as described. No acute superimposed findings evident. Electronically Signed   By: William  Veazey M.D.   On: 06/14/2015 19:31   Ct Angio Chest Pe W/cm &/or Wo   Cm  06/15/2015  CLINICAL DATA:  Acute onset of shortness of breath. Decreased O2 saturation. Initial encounter. EXAM: CT ANGIOGRAPHY CHEST WITH CONTRAST TECHNIQUE: Multidetector CT imaging of the chest was performed using the standard protocol during bolus administration of intravenous contrast. Multiplanar CT image reconstructions and MIPs were obtained to evaluate the vascular anatomy. CONTRAST:  60m OMNIPAQUE IOHEXOL 350 MG/ML SOLN COMPARISON:  Chest radiograph performed earlier today at 7:01 p.m., and CT of the chest performed 04/14/2015 FINDINGS: There is no evidence of pulmonary embolus. Marked scarring is again noted at the left upper lobe, with more mild scarring at the right upper lobe. This appearance is stable from the  prior CT. Additional scarring and underlying emphysematous change are noted bilaterally. Underlying new patchy bibasilar airspace opacities are concerning for pneumonia. Nodularity along the right lower lobe wedge resection is relatively stable from the recent prior study, though as before, recurrent malignancy cannot be excluded. There is no evidence of pleural effusion or pneumothorax. Nodularity about the hila is grossly stable, though somewhat better characterized given contrast. This may simply reflect scarring, though underlying hilar nodes are in concern. The patient is status post median sternotomy. No axillary lymphadenopathy is seen. The thyroid gland is unremarkable in appearance. A left-sided AICD is noted. The visualized portions of the liver and spleen are unremarkable. The visualized portions of the pancreas, gallbladder, stomach, adrenal glands and right kidney are within normal limits. A 1.4 cm cyst is noted at the interpole region of the left kidney. No acute osseous abnormalities are seen. Review of the MIP images confirms the above findings. IMPRESSION: 1. No evidence of pulmonary embolus. 2. Patchy bibasilar airspace opacities are concerning for pneumonia, new from the prior study. 3. Marked scarring at the left upper lobe, with more mild scarring at the right upper lobe, stable from the prior study. Additional scarring and underlying emphysematous change noted bilaterally. 4. Nodularity along the right lower lobe wedge resection is relatively stable from the recent prior study, though as before, recurrent malignancy cannot be excluded. 5. Nodularity about the hila is grossly stable. This may simply reflect scarring, though underlying prominent hilar nodes are a concern. 6. Left renal cyst noted. Electronically Signed   By: JGarald BaldingM.D.   On: 06/15/2015 01:31        Filed Weights   06/14/15 1845 06/16/15 0258 06/17/15 0400  Weight: 53.184 kg (117 lb 4 oz) 53.661 kg (118 lb 4.8  oz) 52.6 kg (115 lb 15.4 oz)     Microbiology: Recent Results (from the past 240 hour(s))  Culture, blood (routine x 2)     Status: None (Preliminary result)   Collection Time: 06/14/15  6:44 PM  Result Value Ref Range Status   Specimen Description BLOOD LEFT ARM  Final   Special Requests BOTTLES DRAWN AEROBIC ONLY 5CC  Final   Culture NO GROWTH 3 DAYS  Final   Report Status PENDING  Incomplete  Urine culture     Status: None   Collection Time: 06/14/15  7:43 PM  Result Value Ref Range Status   Specimen Description URINE, CLEAN CATCH  Final   Special Requests NONE  Final   Culture MULTIPLE SPECIES PRESENT, SUGGEST RECOLLECTION  Final   Report Status 06/15/2015 FINAL  Final  Culture, blood (routine x 2)     Status: None (Preliminary result)   Collection Time: 06/14/15  8:40 PM  Result Value Ref Range Status   Specimen Description BLOOD LEFT ANTECUBITAL  Final  Special Requests BOTTLES DRAWN AEROBIC ONLY 5CC  Final   Culture NO GROWTH 3 DAYS  Final   Report Status PENDING  Incomplete       Blood Culture    Component Value Date/Time   SDES URINE, RANDOM 06/15/2015 2100   SPECREQUEST NONE 06/15/2015 2100   CULT NO GROWTH 3 DAYS 06/14/2015 2040   REPTSTATUS 06/16/2015 FINAL 06/15/2015 2100      Labs: Results for orders placed or performed during the hospital encounter of 06/14/15 (from the past 48 hour(s))  Strep pneumoniae urinary antigen     Status: None   Collection Time: 06/15/15  9:00 PM  Result Value Ref Range   Strep Pneumo Urinary Antigen NEGATIVE NEGATIVE    Comment:        Infection due to S. pneumoniae cannot be absolutely ruled out since the antigen present may be below the detection limit of the test.   Legionella antigen, urine (not at [facility])     Status: None   Collection Time: 06/15/15  9:00 PM  Result Value Ref Range   Specimen Description URINE, RANDOM    Special Requests NONE    Legionella Antigen, Urine      Negative for Legionella  pneumophila serogroup 1                                                              Legionella pneumophila serogroup 1 antigen can be detected in urine within 2 to 3 days of infection and may persist even after treatment. This  assay does not detect other Legionella species or serogroups. Performed at [facility]    Report Status 06/16/2015 FINAL   Comprehensive metabolic panel     Status: Abnormal   Collection Time: 06/16/15  2:40 AM  Result Value Ref Range   Sodium 133 (L) 135 - 145 mmol/L   Potassium 5.8 (H) 3.5 - 5.1 mmol/L   Chloride 103 101 - 111 mmol/L   CO2 23 22 - 32 mmol/L   Glucose, Bld 115 (H) 65 - 99 mg/dL   BUN 16 6 - 20 mg/dL   Creatinine, Ser 0.71 0.61 - 1.24 mg/dL   Calcium 9.2 8.9 - 10.3 mg/dL   Total Protein 8.3 (H) 6.5 - 8.1 g/dL   Albumin 2.7 (L) 3.5 - 5.0 g/dL   AST 24 15 - 41 U/L   ALT 15 (L) 17 - 63 U/L   Alkaline Phosphatase 55 38 - 126 U/L   Total Bilirubin 0.5 0.3 - 1.2 mg/dL   GFR calc non Af Amer >60 >60 mL/min   GFR calc Af Amer >60 >60 mL/min    Comment: (NOTE) The eGFR has been calculated using the CKD EPI equation. This calculation has not been validated in all clinical situations. eGFR's persistently <60 mL/min signify possible Chronic Kidney Disease.    Anion gap 7 5 - 15  CBC WITH DIFFERENTIAL     Status: Abnormal   Collection Time: 06/16/15  2:40 AM  Result Value Ref Range   WBC 17.0 (H) 4.0 - 10.5 K/uL   RBC 4.65 4.22 - 5.81 MIL/uL   Hemoglobin 13.6 13.0 - 17.0 g/dL   HCT 38.8 (L) 39.0 - 52.0 %   MCV 83.4 78.0 - 100.0 fL   MCH 29.2 26.0 -   34.0 pg   MCHC 35.1 30.0 - 36.0 g/dL   RDW 14.5 11.5 - 15.5 %   Platelets 261 150 - 400 K/uL   Neutrophils Relative % 78 %   Neutro Abs 13.2 (H) 1.7 - 7.7 K/uL   Lymphocytes Relative 13 %   Lymphs Abs 2.2 0.7 - 4.0 K/uL   Monocytes Relative 9 %   Monocytes Absolute 1.6 (H) 0.1 - 1.0 K/uL   Eosinophils Relative 0 %   Eosinophils Absolute 0.0 0.0 - 0.7 K/uL   Basophils Relative 0 %    Basophils Absolute 0.0 0.0 - 0.1 K/uL  Basic metabolic panel     Status: None   Collection Time: 06/17/15  3:10 AM  Result Value Ref Range   Sodium 140 135 - 145 mmol/L    Comment: DELTA CHECK NOTED   Potassium 4.2 3.5 - 5.1 mmol/L    Comment: DELTA CHECK NOTED   Chloride 105 101 - 111 mmol/L   CO2 25 22 - 32 mmol/L   Glucose, Bld 91 65 - 99 mg/dL   BUN 10 6 - 20 mg/dL   Creatinine, Ser 0.75 0.61 - 1.24 mg/dL   Calcium 9.2 8.9 - 10.3 mg/dL   GFR calc non Af Amer >60 >60 mL/min   GFR calc Af Amer >60 >60 mL/min    Comment: (NOTE) The eGFR has been calculated using the CKD EPI equation. This calculation has not been validated in all clinical situations. eGFR's persistently <60 mL/min signify possible Chronic Kidney Disease.    Anion gap 10 5 - 15  CBC     Status: Abnormal   Collection Time: 06/17/15  3:10 AM  Result Value Ref Range   WBC 10.5 4.0 - 10.5 K/uL   RBC 4.55 4.22 - 5.81 MIL/uL   Hemoglobin 13.3 13.0 - 17.0 g/dL   HCT 38.9 (L) 39.0 - 52.0 %   MCV 85.5 78.0 - 100.0 fL   MCH 29.2 26.0 - 34.0 pg   MCHC 34.2 30.0 - 36.0 g/dL   RDW 14.8 11.5 - 15.5 %   Platelets 294 150 - 400 K/uL     Lipid Panel     Component Value Date/Time   CHOL 107 09/29/2014 0949   TRIG 47 09/29/2014 0949   HDL 39* 09/29/2014 0949   CHOLHDL 2.7 09/29/2014 0949   VLDL 9 09/29/2014 0949   LDLCALC 59 09/29/2014 0949     No results found for: HGBA1C   Lab Results  Component Value Date   LDLCALC 59 09/29/2014   CREATININE 0.75 06/17/2015     HPI :54 y.o. male with a past medical history of COPD/emphysema, remote tuberculosis, non-small cell lung cancer currently under therapy with oncology, CAD, chronic systolic CHF, hyperlipidemia who comes to the emergency department with complaints of shortness of breath for the past 2 days.  Per patient, since Friday night he has been feeling progressively worse shortness of breath, associated with chills, fatigue, nonproductive cough, mild  dizziness, nausea and decreased appetite. He denies fever, chest pain, palpitations, diaphoresis, PND, orthopnea or lower extremity edema.  Initial evaluation in the ER shows a pulse oximetry of 87% in triage. Per patient he is supposed to be on home oxygen, but has not had the oxygen tanks delivered to him. He received IV fluids, IV antibiotics, supplemental oxygen, bronchodilators, glucocorticoids and anxiolytic and states that he feels better. He is currently in no acute distress, speaking full sentences O2 sats is in the high 90s with supplemental   oxygen.  HOSPITAL COURSE: CAP (community acquired pneumonia): Started on rocephin, azithromycin on admission. Urine strep pneumonia, legionella ag neg. Lactic acidosis cleared. DC home on azithro plus omnicef Pulmonary symptoms have improved and the patient would like to be discharged home   Coronary artery disease stable  Hyperlipidemia-stable  Adenocarcinoma of lung, stage 1, s/p wedge resection-   Chronic systolic heart failure (HCC): BNP just over 100. Clinically compensated. Echo with LV EF 20-25% and RV systolic function reduced. Continue coreg. Hold ACE due to worsening hyperkalemia. This may be restarted by PCP when renal function and potassium is stable on follow-up   emphysema (HCC) with chronic respiratory failure. No longer has home oxygen, as he moved. Arranged for DME oxygen at DC  Hyperkalemia: K up to 5.8 today. Hold lisinopril. Given  kayexalate and recheck is 4.2, advised to hold ACE pending follow up with PCP    Discharge Exam:    Blood pressure 106/57, pulse 85, temperature 97.8 F (36.6 C), temperature source Oral, resp. rate 18, height 5' 8" (1.727 m), weight 52.6 kg (115 lb 15.4 oz), SpO2 97 %.  Cardiovascular: Regular rhythm, normal heart sounds and intact distal pulses. Tachycardia present. Exam reveals no gallop and no friction rub.  No murmur heard. Pulmonary/Chest: Effort normal. Tachypnea noted. No  respiratory distress. He has wheezes (occasional). He has no rales.  Abdominal: Soft. He exhibits no distension. There is no tenderness. There is no guarding.  Musculoskeletal: He exhibits no edema.  Neurological: He is alert and oriented to person, place, and time.  Skin: Skin is warm and dry. He is not diaphoretic.  Nursing note and vitals reviewed.  Follow-up Information    Follow up with BLAND,VEITA J, MD. Schedule an appointment as soon as possible for a visit in 1 week.   Specialty:  Family Medicine   Contact information:   [address] [place] [address] [phone]       Signed: ABROL,NAYANA 06/17/2015, 11:09 AM        Time spent >45 mins   

## 2015-06-17 NOTE — Progress Notes (Signed)
Patient given discharge instructions, medication list and prescriptions sent to personal pharmacy (changed to [facility] road and Dr Allyson Sabal aware) all questions answered will discharge home as ordered. Leta Bucklin, Bettina Gavia RN

## 2015-06-17 NOTE — Care Management Note (Addendum)
Case Management Note Marvetta Gibbons RN, BSN Unit 2W-Case Manager [phone]  Patient Details  Name: [name] MRN: [id number] Date of Birth: 1960/07/28  Subjective/Objective:  Pt admitted with SOB/CAP                  Action/Plan: PTA pt lived at home - PCP- Dr. Criss Rosales- per conversation with pt he states that he had home 02 with [facility] but had it picked up due to he thought he was going to move but never did and never had the 02 returned- call made to [facility] to confirm- per [facility] with [facility]- pt had them pick 02 up back in June 2016- pt has been without home 02 since. Per [facility] pt will need new qualification note and new home 02 orders to arrange home 02 at time of discharge. Have left physician note in epic and beside RN to do qualifying note for home 02. NCM to f/u on home 02 needs prior to d/c home- have verified pt's address and phone # in epic.   Expected Discharge Date:    06/17/15              Expected Discharge Plan:  Home/Self Care  In-House Referral:     Discharge planning Services  CM Consult  Post Acute Care Choice:  Durable Medical Equipment Choice offered to:  Patient  DME Arranged:  Oxygen DME Agency:  [place]:    [place]:     Status of Service: Completed  Medicare Important Message Given:    Date Medicare IM Given:    Medicare IM give by:    Date Additional Medicare IM Given:    Additional Medicare Important Message give by:     If discussed at [place] of Stay Meetings, dates discussed:    Discharge Disposition: Home/self care   Additional Comments:  06/17/15- orders placed for home 02- call made to [facility] with [facility] regarding DME needs- portable tank to be delivered to room prior to discharge.   Dawayne Patricia, RN 06/17/2015, 11:11 AM

## 2015-06-17 NOTE — Evaluation (Signed)
Physical Therapy Evaluation and Discharge Patient Details Name: [name] MRN: [id number] DOB: [birth year] Today's Date: 06/17/2015   History of Present Illness  [name] is a 55 y.o. male with a past medical history of COPD/emphysema, remote tuberculosis, non-small cell lung cancer currently under therapy with oncology, CAD, chronic systolic CHF, hyperlipidemia who comes to the emergency department with complaints of shortness of breath for the past 2 days.  Clinical Impression  Pt functioning at baseline. Pt mod I with all transfers and mobility. PT only managed O2 tank. Pt safe to d/c home once medically stable. Pt with no further acute PT needs at this time. PT SIGNING off. Please re-consult if needed in future.    Follow Up Recommendations No PT follow up    Equipment Recommendations  None recommended by PT    Recommendations for Other Services       Precautions / Restrictions Precautions Precautions: None Precaution Comments: on 2 LO2 via [place] Restrictions Weight Bearing Restrictions: No      Mobility  Bed Mobility Overal bed mobility: Independent             General bed mobility comments: no use of bed rail, hob flat  Transfers Overall transfer level: Independent Equipment used: None             General transfer comment: no sign of instability  Ambulation/Gait Ambulation/Gait assistance: Min guard Ambulation Distance (Feet): 250 Feet Assistive device: None Gait Pattern/deviations: WFL(Within Functional Limits) Gait velocity: wfl   General Gait Details: no episodes of LOB  Stairs Stairs: Yes Stairs assistance: Supervision Stair Management: One rail Left;Alternating pattern Number of Stairs: 4 General stair comments: no episodes of LOB  Wheelchair Mobility    Modified Rankin (Stroke Patients Only)       Balance Overall balance assessment: No apparent balance deficits (not formally assessed)                                Standardized Balance Assessment Standardized Balance Assessment : Dynamic Gait Index   Dynamic Gait Index Level Surface: Normal Change in Gait Speed: Normal Gait with Horizontal Head Turns: Normal Gait with Vertical Head Turns: Normal Gait and Pivot Turn: Normal Step Over Obstacle: Normal Step Around Obstacles: Normal Steps: Mild Impairment Total Score: 23       Pertinent Vitals/Pain Pain Assessment: No/denies pain    Home Living Family/patient expects to be discharged to:: Private residence Living Arrangements: Other relatives (cousin) Available Help at Discharge: Family;Available PRN/intermittently Type of Home: House Home Access: Stairs to enter Entrance Stairs-Rails: Left Entrance Stairs-Number of Steps: 3 Home Layout: One level Home Equipment: None Additional Comments: pt now has home O2    Prior Function Level of Independence: Independent         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   Dominant Hand: Right    Extremity/Trunk Assessment   Upper Extremity Assessment: Overall WFL for tasks assessed           Lower Extremity Assessment: Overall WFL for tasks assessed      Cervical / Trunk Assessment: Normal  Communication   Communication: No difficulties  Cognition Arousal/Alertness: Awake/alert Behavior During Therapy: WFL for tasks assessed/performed Overall Cognitive Status: Within Functional Limits for tasks assessed                      General Comments  Exercises        Assessment/Plan    PT Assessment Patent does not need any further PT services  PT Diagnosis Other (comment) (activity tolerance)   PT Problem List    PT Treatment Interventions     PT Goals (Current goals can be found in the Care Plan section) Acute Rehab PT Goals Patient Stated Goal: home asap PT Goal Formulation: All assessment and education complete, DC therapy    Frequency     Barriers to discharge         Co-evaluation               End of Session Equipment Utilized During Treatment: Oxygen (2LO2 via [place]) Activity Tolerance: Patient tolerated treatment well Patient left: in chair;with call bell/phone within reach Nurse Communication: Mobility status         Time: 3567-0141 PT Time Calculation (min) (ACUTE ONLY): 15 min   Charges:   PT Evaluation $PT Eval Low Complexity: 1 Procedure     PT G CodesKingsley Callander 06/17/2015, 3:00 PM   Kittie Plater, PT, DPT Pager #: [phone] Office #: [phone]

## 2015-06-17 NOTE — Progress Notes (Signed)
SATURATION QUALIFICATIONS: (This note is used to comply with regulatory documentation for home oxygen)  Patient Saturations on Room Air at Rest = 95 % (02 just removed)  Patient Saturations on Room Air while Ambulating = 88%  Patient Saturations on 2 Liters of oxygen while Ambulating = 95  Placed on when back in room  Please briefly explain why patient needs home oxygen: Patient oxygen level dropped while ambulating

## 2015-06-18 ENCOUNTER — Telehealth: Payer: Self-pay | Admitting: Internal Medicine

## 2015-06-18 NOTE — Telephone Encounter (Signed)
Attempted to call pt. No answer. Will try back. 

## 2015-06-19 LAB — CULTURE, BLOOD (ROUTINE X 2)
Culture: NO GROWTH
Culture: NO GROWTH

## 2015-06-19 NOTE — Telephone Encounter (Signed)
You are correct Ria Comment. If he does not want it he can dc it. Ensure fu wit TP/SG nexrt few weeks for post hospital fu

## 2015-06-19 NOTE — Telephone Encounter (Signed)
Atc pt X2, fast busy signal.  Wcb.

## 2015-06-19 NOTE — Telephone Encounter (Signed)
Patient Returned call [phone]

## 2015-06-19 NOTE — Telephone Encounter (Signed)
Spoke with pt. States that when he left the hospital for PNA, oxygen was ordered for day time use. Pt only uses oxygen at night time. [facility] brought him tanks over to his house. He is very upset about this a does not want it. Advised him that we were not the ones who ordered it so we can't discontinue something we didn't ordered.  MR - please advise. Thanks.

## 2015-06-22 NOTE — Telephone Encounter (Signed)
lmtcb x1 

## 2015-06-22 NOTE — Telephone Encounter (Signed)
lmomtcb x1 for pt on VM 

## 2015-06-22 NOTE — Telephone Encounter (Signed)
Pt returning call.Stanley A Dalton ° °

## 2015-06-23 ENCOUNTER — Telehealth: Payer: Self-pay | Admitting: *Deleted

## 2015-06-23 DIAGNOSIS — E782 Mixed hyperlipidemia: Secondary | ICD-10-CM | POA: Diagnosis not present

## 2015-06-23 DIAGNOSIS — I10 Essential (primary) hypertension: Secondary | ICD-10-CM | POA: Diagnosis not present

## 2015-06-23 DIAGNOSIS — J441 Chronic obstructive pulmonary disease with (acute) exacerbation: Secondary | ICD-10-CM | POA: Diagnosis not present

## 2015-06-23 DIAGNOSIS — Z8701 Personal history of pneumonia (recurrent): Secondary | ICD-10-CM | POA: Diagnosis not present

## 2015-06-23 NOTE — Telephone Encounter (Signed)
Called and spoke to pt. Informed him of the recs per MR. Appt made with TP for 2.1.17 for HFU. Pt verbalized understanding and denied any further questions or concerns at this time.

## 2015-06-23 NOTE — Telephone Encounter (Signed)
  Oncology Nurse Navigator Documentation  Navigator Location: [facility] (06/23/15 1141) Navigator Encounter Type: Telephone (left voice mail message and contact information) (06/23/15 1141)  I called patient to check in.  I left a voice mail message with contact information and a request that he return my call.           Treatment Phase: Follow-up (06/23/15 1141)

## 2015-06-26 ENCOUNTER — Encounter: Payer: Self-pay | Admitting: Internal Medicine

## 2015-06-26 ENCOUNTER — Ambulatory Visit (INDEPENDENT_AMBULATORY_CARE_PROVIDER_SITE_OTHER): Payer: Medicare Other | Admitting: Internal Medicine

## 2015-06-26 VITALS — BP 86/60 | HR 80 | Ht 68.0 in | Wt 123.6 lb

## 2015-06-26 DIAGNOSIS — Z95810 Presence of automatic (implantable) cardiac defibrillator: Secondary | ICD-10-CM

## 2015-06-26 DIAGNOSIS — I255 Ischemic cardiomyopathy: Secondary | ICD-10-CM | POA: Diagnosis not present
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
## 2015-06-26 NOTE — Patient Instructions (Signed)
Medication Instructions: - no changes  Labwork: - none  Procedures/Testing: - none  Follow-Up: - Remote monitoring is used to monitor your Pacemaker of ICD from home. This monitoring reduces the number of office visits required to check your device to one time per year. It allows [place] to keep an eye on the functioning of your device to ensure it is working properly. You are scheduled for a device check from home on 09/28/15. You may send your transmission at any time that day. If you have a wireless device, the transmission will be sent automatically. After your physician reviews your transmission, you will receive a postcard with your next transmission date.  - Your physician wants you to follow-up in: 1 year with Dr. Caryl Comes. You will receive a reminder letter in the mail two months in advance. If you don't receive a letter, please call our office to schedule the follow-up appointment.  Any Additional Special Instructions Will Be Listed Below (If Applicable).     If you need a refill on your cardiac medications before your next appointment, please call your pharmacy.

## 2015-06-29 ENCOUNTER — Telehealth: Payer: Self-pay | Admitting: *Deleted

## 2015-06-29 NOTE — Telephone Encounter (Signed)
  Oncology Nurse Navigator Documentation  Navigator Location: [facility] (06/29/15 1300) Navigator Encounter Type: Telephone (06/29/15 1300)  I called patient to check in.  He reports that he is doing OK.  He denied any questions, concerns or needs at this time.  I encouraged him to call me for any assistance he may need.  He verbalized understanding.           Treatment Phase: Follow-up (06/29/15 1300) Barriers/Navigation Needs: No barriers at this time;No Questions;No Needs (06/29/15 1300)   Interventions: None required (06/29/15 1300)            Acuity: Level 1 (06/29/15 1300) Acuity Level 1: Minimal follow up required (06/29/15 1300)       Time Spent with Patient: 15 (06/29/15 1300)

## 2015-06-30 ENCOUNTER — Other Ambulatory Visit: Payer: Self-pay | Admitting: *Deleted

## 2015-06-30 MED ORDER — CARVEDILOL 3.125 MG PO TABS: 3.1250 mg | ORAL_TABLET | Freq: Two times a day (BID) | ORAL | Status: DC

## 2015-06-30 MED ORDER — EZETIMIBE 10 MG PO TABS: 10.0000 mg | ORAL_TABLET | Freq: Every day | ORAL | Status: DC

## 2015-07-01 ENCOUNTER — Inpatient Hospital Stay: Payer: Medicare Other | Admitting: Adult Health

## 2015-07-06 ENCOUNTER — Ambulatory Visit (INDEPENDENT_AMBULATORY_CARE_PROVIDER_SITE_OTHER)
Admission: RE | Admit: 2015-07-06 | Discharge: 2015-07-06 | Disposition: A | Discharge status: Home / Self Care | Payer: Medicare Other | Source: Ambulatory Visit | Attending: Adult Health | Admitting: Adult Health

## 2015-07-06 ENCOUNTER — Ambulatory Visit (INDEPENDENT_AMBULATORY_CARE_PROVIDER_SITE_OTHER): Payer: Medicare Other | Admitting: Adult Health

## 2015-07-06 ENCOUNTER — Encounter: Payer: Self-pay | Admitting: Adult Health

## 2015-07-06 VITALS — BP 136/72 | HR 80 | Temp 98.0°F | Ht 68.0 in | Wt 128.0 lb

## 2015-07-06 DIAGNOSIS — I255 Ischemic cardiomyopathy: Secondary | ICD-10-CM | POA: Diagnosis not present

## 2015-07-06 DIAGNOSIS — J439 Emphysema, unspecified: Secondary | ICD-10-CM | POA: Diagnosis not present

## 2015-07-06 DIAGNOSIS — J189 Pneumonia, unspecified organism: Secondary | ICD-10-CM | POA: Diagnosis not present

## 2015-07-06 LAB — CUP PACEART INCLINIC DEVICE CHECK
HighPow Impedance: 61.875
Implantable Lead Implant Date: 20140929
Implantable Lead Model: 181
Implantable Lead Serial Number: 325827
Lead Channel Impedance Value: 350 Ohm
Lead Channel Pacing Threshold Amplitude: 0.75 V
Lead Channel Pacing Threshold Pulse Width: 0.5 ms
Lead Channel Setting Pacing Amplitude: 2.5 V
MDC IDC LEAD LOCATION: 753860
MDC IDC MSMT BATTERY REMAINING LONGEVITY: 82.8
MDC IDC MSMT LEADCHNL RV SENSING INTR AMPL: 11.3 mV
MDC IDC SESS DTM: 20170127202135
MDC IDC SET LEADCHNL RV PACING PULSEWIDTH: 0.5 ms
MDC IDC SET LEADCHNL RV SENSING SENSITIVITY: 0.5 mV
MDC IDC STAT BRADY RV PERCENT PACED: 0 %
Pulse Gen Serial Number: 7132440

## 2015-07-06 NOTE — Assessment & Plan Note (Signed)
Recent admission with CAP -w/ BB aspdz  Improved clinically with abx cxr reviewed with chronic changes only  Plan  Continue Spiriva and Advair .  Continue to work on not smoking .  Follow up Dr. Chase Caller in 2-3 months and As needed   Please contact office for sooner follow up if symptoms do not improve or worsen or seek emergency care

## 2015-07-06 NOTE — Assessment & Plan Note (Signed)
Stable on Adviar and Spiriva  Cont on current regimen
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
## 2015-07-06 NOTE — Patient Instructions (Signed)
Continue Spiriva and Advair .  Continue to work on not smoking .  Follow up Dr. Chase Caller in 2-3 months and As needed   Please contact office for sooner follow up if symptoms do not improve or worsen or seek emergency care

## 2015-07-08 ENCOUNTER — Other Ambulatory Visit: Payer: Self-pay | Admitting: *Deleted

## 2015-07-08 DIAGNOSIS — H00026 Hordeolum internum left eye, unspecified eyelid: Secondary | ICD-10-CM | POA: Diagnosis not present

## 2015-07-08 DIAGNOSIS — Z8701 Personal history of pneumonia (recurrent): Secondary | ICD-10-CM | POA: Diagnosis not present

## 2015-07-08 DIAGNOSIS — J441 Chronic obstructive pulmonary disease with (acute) exacerbation: Secondary | ICD-10-CM | POA: Diagnosis not present

## 2015-07-08 MED ORDER — CARVEDILOL 3.125 MG PO TABS: 3.1250 mg | ORAL_TABLET | Freq: Two times a day (BID) | ORAL | Status: DC

## 2015-07-09 DIAGNOSIS — L720 Epidermal cyst: Secondary | ICD-10-CM | POA: Diagnosis not present

## 2015-07-14 ENCOUNTER — Other Ambulatory Visit: Payer: Self-pay | Admitting: *Deleted

## 2015-07-14 MED ORDER — EZETIMIBE 10 MG PO TABS: 10.0000 mg | ORAL_TABLET | Freq: Every day | ORAL | Status: DC

## 2015-07-14 NOTE — Telephone Encounter (Signed)
Rx(s) sent to pharmacy electronically.  

## 2015-07-30 ENCOUNTER — Other Ambulatory Visit: Payer: Self-pay | Admitting: Internal Medicine

## 2015-08-06 DIAGNOSIS — L720 Epidermal cyst: Secondary | ICD-10-CM | POA: Diagnosis not present

## 2015-09-17 ENCOUNTER — Other Ambulatory Visit: Payer: Self-pay | Admitting: Thoracic Surgery (Cardiothoracic Vascular Surgery)

## 2015-09-17 DIAGNOSIS — C3490 Malignant neoplasm of unspecified part of unspecified bronchus or lung: Secondary | ICD-10-CM

## 2015-09-28 ENCOUNTER — Telehealth: Payer: Self-pay | Admitting: Cardiology

## 2015-09-28 ENCOUNTER — Encounter: Payer: Medicare Other | Admitting: *Deleted

## 2015-09-28 NOTE — Telephone Encounter (Signed)
LMOVM reminding pt to send remote transmission.   

## 2015-09-29 ENCOUNTER — Emergency Department (HOSPITAL_COMMUNITY): Payer: Medicare Other

## 2015-09-29 ENCOUNTER — Encounter (HOSPITAL_COMMUNITY): Payer: Self-pay | Admitting: *Deleted

## 2015-09-29 DIAGNOSIS — J440 Chronic obstructive pulmonary disease with acute lower respiratory infection: Secondary | ICD-10-CM | POA: Insufficient documentation

## 2015-09-29 DIAGNOSIS — Z85118 Personal history of other malignant neoplasm of bronchus and lung: Secondary | ICD-10-CM | POA: Insufficient documentation

## 2015-09-29 DIAGNOSIS — J209 Acute bronchitis, unspecified: Secondary | ICD-10-CM | POA: Insufficient documentation

## 2015-09-29 DIAGNOSIS — I252 Old myocardial infarction: Secondary | ICD-10-CM | POA: Insufficient documentation

## 2015-09-29 DIAGNOSIS — J449 Chronic obstructive pulmonary disease, unspecified: Secondary | ICD-10-CM | POA: Diagnosis not present

## 2015-09-29 DIAGNOSIS — Z8701 Personal history of pneumonia (recurrent): Secondary | ICD-10-CM | POA: Insufficient documentation

## 2015-09-29 DIAGNOSIS — R0602 Shortness of breath: Secondary | ICD-10-CM | POA: Diagnosis not present

## 2015-09-29 DIAGNOSIS — Z8611 Personal history of tuberculosis: Secondary | ICD-10-CM | POA: Insufficient documentation

## 2015-09-29 DIAGNOSIS — R079 Chest pain, unspecified: Secondary | ICD-10-CM | POA: Diagnosis present

## 2015-09-29 DIAGNOSIS — Z79899 Other long term (current) drug therapy: Secondary | ICD-10-CM | POA: Diagnosis not present

## 2015-09-29 DIAGNOSIS — I2510 Atherosclerotic heart disease of native coronary artery without angina pectoris: Secondary | ICD-10-CM | POA: Diagnosis not present

## 2015-09-29 DIAGNOSIS — Z862 Personal history of diseases of the blood and blood-forming organs and certain disorders involving the immune mechanism: Secondary | ICD-10-CM | POA: Diagnosis not present

## 2015-09-29 DIAGNOSIS — F17210 Nicotine dependence, cigarettes, uncomplicated: Secondary | ICD-10-CM | POA: Insufficient documentation

## 2015-09-29 DIAGNOSIS — R05 Cough: Secondary | ICD-10-CM | POA: Diagnosis not present

## 2015-09-29 DIAGNOSIS — E785 Hyperlipidemia, unspecified: Secondary | ICD-10-CM | POA: Diagnosis not present

## 2015-09-29 DIAGNOSIS — J40 Bronchitis, not specified as acute or chronic: Secondary | ICD-10-CM | POA: Diagnosis not present

## 2015-09-29 LAB — BASIC METABOLIC PANEL
ANION GAP: 10 (ref 5–15)
BUN: 8 mg/dL (ref 6–20)
CALCIUM: 8.9 mg/dL (ref 8.9–10.3)
CO2: 24 mmol/L (ref 22–32)
Chloride: 107 mmol/L (ref 101–111)
Creatinine, Ser: 0.85 mg/dL (ref 0.61–1.24)
Glucose, Bld: 120 mg/dL — ABNORMAL HIGH (ref 65–99)
POTASSIUM: 3.4 mmol/L — AB (ref 3.5–5.1)
SODIUM: 141 mmol/L (ref 135–145)

## 2015-09-29 LAB — CBC
HCT: 39 % (ref 39.0–52.0)
HEMOGLOBIN: 13.4 g/dL (ref 13.0–17.0)
MCH: 28.6 pg (ref 26.0–34.0)
MCHC: 34.4 g/dL (ref 30.0–36.0)
MCV: 83.3 fL (ref 78.0–100.0)
Platelets: 359 10*3/uL (ref 150–400)
RBC: 4.68 MIL/uL (ref 4.22–5.81)
RDW: 14.5 % (ref 11.5–15.5)
WBC: 6.7 10*3/uL (ref 4.0–10.5)

## 2015-09-29 LAB — TROPONIN I: TROPONIN I: 0.03 ng/mL (ref <0.031)

## 2015-09-29 NOTE — ED Notes (Signed)
The pt is still a smoker

## 2015-09-29 NOTE — ED Notes (Signed)
The pt is c/o chest pain since yesterday with sob.  He has stents  But does not know if he is on a blood thinner

## 2015-09-30 ENCOUNTER — Emergency Department (HOSPITAL_COMMUNITY)
Admission: EM | Admit: 2015-09-30 | Discharge: 2015-09-30 | Disposition: A | Discharge status: Home / Self Care | Payer: Medicare Other | Attending: Emergency Medicine | Admitting: Emergency Medicine

## 2015-09-30 DIAGNOSIS — J209 Acute bronchitis, unspecified: Secondary | ICD-10-CM | POA: Diagnosis not present

## 2015-09-30 DIAGNOSIS — J40 Bronchitis, not specified as acute or chronic: Secondary | ICD-10-CM

## 2015-09-30 MED ORDER — AZITHROMYCIN 250 MG PO TABS
500.0000 mg | ORAL_TABLET | Freq: Once | ORAL | Status: AC
  Administered 2015-09-30: 500 mg via ORAL
  Filled 2015-09-30: qty 2

## 2015-09-30 MED ORDER — AZITHROMYCIN 250 MG PO TABS: 250.0000 mg | ORAL_TABLET | Freq: Every day | ORAL | Status: DC

## 2015-09-30 MED ORDER — HYDROCOD POLST-CPM POLST ER 10-8 MG/5ML PO SUER: 5.0000 mL | Freq: Every evening | ORAL | Status: DC | PRN

## 2015-09-30 NOTE — ED Notes (Signed)
Pt verbalized understanding of discharge instructions and follow up care
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
## 2015-09-30 NOTE — Discharge Instructions (Signed)

## 2015-09-30 NOTE — ED Notes (Addendum)
Pt ambulated in the department. O2 sat maintained between 88-92% without oxygen via nasal cannula. Pt denies feeling short of breath.

## 2015-10-02 ENCOUNTER — Encounter: Payer: Self-pay | Admitting: Cardiology

## 2015-10-06 ENCOUNTER — Ambulatory Visit: Payer: Medicare Other | Admitting: Internal Medicine

## 2015-10-06 DIAGNOSIS — N5201 Erectile dysfunction due to arterial insufficiency: Secondary | ICD-10-CM | POA: Diagnosis not present

## 2015-10-06 DIAGNOSIS — J441 Chronic obstructive pulmonary disease with (acute) exacerbation: Secondary | ICD-10-CM | POA: Diagnosis not present

## 2015-10-06 DIAGNOSIS — C3490 Malignant neoplasm of unspecified part of unspecified bronchus or lung: Secondary | ICD-10-CM | POA: Diagnosis not present

## 2015-10-06 DIAGNOSIS — I10 Essential (primary) hypertension: Secondary | ICD-10-CM | POA: Diagnosis not present

## 2015-10-08 ENCOUNTER — Other Ambulatory Visit: Payer: Self-pay

## 2015-10-08 MED ORDER — SIMVASTATIN 40 MG PO TABS: 40.0000 mg | ORAL_TABLET | Freq: Every day | ORAL | Status: DC

## 2015-10-20 ENCOUNTER — Other Ambulatory Visit: Payer: Self-pay | Admitting: *Deleted

## 2015-10-20 ENCOUNTER — Ambulatory Visit
Admission: RE | Admit: 2015-10-20 | Discharge: 2015-10-20 | Disposition: A | Discharge status: Home / Self Care | Payer: Medicare Other | Source: Ambulatory Visit | Attending: Thoracic Surgery (Cardiothoracic Vascular Surgery) | Admitting: Thoracic Surgery (Cardiothoracic Vascular Surgery)

## 2015-10-20 ENCOUNTER — Ambulatory Visit (INDEPENDENT_AMBULATORY_CARE_PROVIDER_SITE_OTHER): Payer: Medicare Other | Admitting: Thoracic Surgery (Cardiothoracic Vascular Surgery)

## 2015-10-20 ENCOUNTER — Encounter: Payer: Self-pay | Admitting: Thoracic Surgery (Cardiothoracic Vascular Surgery)

## 2015-10-20 VITALS — BP 116/83 | HR 96 | Resp 20 | Ht 68.0 in | Wt 126.0 lb

## 2015-10-20 DIAGNOSIS — Z85118 Personal history of other malignant neoplasm of bronchus and lung: Secondary | ICD-10-CM

## 2015-10-20 DIAGNOSIS — C3490 Malignant neoplasm of unspecified part of unspecified bronchus or lung: Secondary | ICD-10-CM

## 2015-10-20 DIAGNOSIS — C3491 Malignant neoplasm of unspecified part of right bronchus or lung: Secondary | ICD-10-CM | POA: Diagnosis not present

## 2015-10-20 DIAGNOSIS — I255 Ischemic cardiomyopathy: Secondary | ICD-10-CM

## 2015-10-20 DIAGNOSIS — R911 Solitary pulmonary nodule: Secondary | ICD-10-CM

## 2015-10-20 DIAGNOSIS — R918 Other nonspecific abnormal finding of lung field: Secondary | ICD-10-CM | POA: Diagnosis not present
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
## 2015-10-23 ENCOUNTER — Ambulatory Visit (HOSPITAL_COMMUNITY)
Admission: RE | Admit: 2015-10-23 | Discharge: 2015-10-23 | Disposition: A | Discharge status: Home / Self Care | Payer: Medicare Other | Source: Ambulatory Visit | Attending: Thoracic Surgery (Cardiothoracic Vascular Surgery) | Admitting: Thoracic Surgery (Cardiothoracic Vascular Surgery)

## 2015-10-23 DIAGNOSIS — J984 Other disorders of lung: Secondary | ICD-10-CM | POA: Insufficient documentation

## 2015-10-23 DIAGNOSIS — R918 Other nonspecific abnormal finding of lung field: Secondary | ICD-10-CM | POA: Insufficient documentation

## 2015-10-23 DIAGNOSIS — Z85118 Personal history of other malignant neoplasm of bronchus and lung: Secondary | ICD-10-CM

## 2015-10-23 DIAGNOSIS — C3490 Malignant neoplasm of unspecified part of unspecified bronchus or lung: Secondary | ICD-10-CM | POA: Diagnosis not present

## 2015-10-23 LAB — GLUCOSE, CAPILLARY: Glucose-Capillary: 95 mg/dL (ref 65–99)

## 2015-10-23 MED ORDER — FLUDEOXYGLUCOSE F - 18 (FDG) INJECTION
6.4000 | Freq: Once | INTRAVENOUS | Status: AC | PRN
  Administered 2015-10-23: 6.2 via INTRAVENOUS

## 2015-10-28 ENCOUNTER — Other Ambulatory Visit: Payer: Self-pay | Admitting: *Deleted

## 2015-10-29 ENCOUNTER — Other Ambulatory Visit: Payer: Self-pay | Admitting: General Surgery

## 2015-10-30 ENCOUNTER — Other Ambulatory Visit: Payer: Self-pay | Admitting: Radiology

## 2015-11-02 ENCOUNTER — Ambulatory Visit (HOSPITAL_COMMUNITY)
Admission: RE | Admit: 2015-11-02 | Discharge: 2015-11-02 | Disposition: A | Discharge status: Home / Self Care | Payer: Medicare Other | Source: Ambulatory Visit | Attending: Interventional Radiology | Admitting: Interventional Radiology

## 2015-11-02 ENCOUNTER — Encounter (HOSPITAL_COMMUNITY): Payer: Self-pay

## 2015-11-02 ENCOUNTER — Ambulatory Visit (HOSPITAL_COMMUNITY)
Admission: RE | Admit: 2015-11-02 | Discharge: 2015-11-02 | Disposition: A | Discharge status: Home / Self Care | Payer: Medicare Other | Source: Ambulatory Visit | Attending: Thoracic Surgery (Cardiothoracic Vascular Surgery) | Admitting: Thoracic Surgery (Cardiothoracic Vascular Surgery)

## 2015-11-02 DIAGNOSIS — F17210 Nicotine dependence, cigarettes, uncomplicated: Secondary | ICD-10-CM | POA: Diagnosis not present

## 2015-11-02 DIAGNOSIS — R911 Solitary pulmonary nodule: Secondary | ICD-10-CM | POA: Diagnosis not present

## 2015-11-02 DIAGNOSIS — Z79899 Other long term (current) drug therapy: Secondary | ICD-10-CM | POA: Insufficient documentation

## 2015-11-02 DIAGNOSIS — I255 Ischemic cardiomyopathy: Secondary | ICD-10-CM | POA: Insufficient documentation

## 2015-11-02 DIAGNOSIS — E785 Hyperlipidemia, unspecified: Secondary | ICD-10-CM | POA: Insufficient documentation

## 2015-11-02 DIAGNOSIS — Z9889 Other specified postprocedural states: Secondary | ICD-10-CM | POA: Insufficient documentation

## 2015-11-02 DIAGNOSIS — Z7982 Long term (current) use of aspirin: Secondary | ICD-10-CM | POA: Insufficient documentation

## 2015-11-02 DIAGNOSIS — I2510 Atherosclerotic heart disease of native coronary artery without angina pectoris: Secondary | ICD-10-CM | POA: Insufficient documentation

## 2015-11-02 DIAGNOSIS — Z85118 Personal history of other malignant neoplasm of bronchus and lung: Secondary | ICD-10-CM | POA: Diagnosis not present

## 2015-11-02 DIAGNOSIS — C3431 Malignant neoplasm of lower lobe, right bronchus or lung: Secondary | ICD-10-CM | POA: Insufficient documentation

## 2015-11-02 DIAGNOSIS — I509 Heart failure, unspecified: Secondary | ICD-10-CM | POA: Diagnosis not present

## 2015-11-02 DIAGNOSIS — R918 Other nonspecific abnormal finding of lung field: Secondary | ICD-10-CM

## 2015-11-02 LAB — CBC
HEMATOCRIT: 43.4 % (ref 39.0–52.0)
Hemoglobin: 14.9 g/dL (ref 13.0–17.0)
MCH: 28.6 pg (ref 26.0–34.0)
MCHC: 34.3 g/dL (ref 30.0–36.0)
MCV: 83.3 fL (ref 78.0–100.0)
Platelets: 327 10*3/uL (ref 150–400)
RBC: 5.21 MIL/uL (ref 4.22–5.81)
RDW: 13.8 % (ref 11.5–15.5)
WBC: 7.3 10*3/uL (ref 4.0–10.5)

## 2015-11-02 LAB — APTT: aPTT: 40 seconds — ABNORMAL HIGH (ref 24–37)

## 2015-11-02 LAB — PROTIME-INR
INR: 1.13 (ref 0.00–1.49)
Prothrombin Time: 14.7 seconds (ref 11.6–15.2)

## 2015-11-02 MED ORDER — MIDAZOLAM HCL 2 MG/2ML IJ SOLN
INTRAMUSCULAR | Status: AC | PRN
  Administered 2015-11-02: 1 mg via INTRAVENOUS

## 2015-11-02 MED ORDER — MIDAZOLAM HCL 2 MG/2ML IJ SOLN
INTRAMUSCULAR | Status: AC
  Filled 2015-11-02: qty 4

## 2015-11-02 MED ORDER — HYDROCODONE-ACETAMINOPHEN 5-325 MG PO TABS: 1.0000 | ORAL_TABLET | ORAL | Status: DC | PRN

## 2015-11-02 MED ORDER — LIDOCAINE HCL 1 % IJ SOLN
INTRAMUSCULAR | Status: AC
  Filled 2015-11-02: qty 20

## 2015-11-02 MED ORDER — SODIUM CHLORIDE 0.9 % IV SOLN
INTRAVENOUS | Status: AC | PRN
  Administered 2015-11-02: 10 mL/h via INTRAVENOUS

## 2015-11-02 MED ORDER — FENTANYL CITRATE (PF) 100 MCG/2ML IJ SOLN
INTRAMUSCULAR | Status: AC
  Filled 2015-11-02: qty 4

## 2015-11-02 MED ORDER — FENTANYL CITRATE (PF) 100 MCG/2ML IJ SOLN
INTRAMUSCULAR | Status: AC | PRN
  Administered 2015-11-02: 50 ug via INTRAVENOUS

## 2015-11-02 MED ORDER — SODIUM CHLORIDE 0.9 % IV SOLN: INTRAVENOUS | Status: DC
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
## 2015-11-02 NOTE — Procedures (Signed)
CT core R lung nodule No complication No blood loss. See complete dictation in [facility].

## 2015-11-02 NOTE — Discharge Instructions (Signed)

## 2015-11-02 NOTE — Sedation Documentation (Signed)
Patient denies pain and is resting comfortably.  

## 2015-11-02 NOTE — Sedation Documentation (Signed)
Patient is resting comfortably. 

## 2015-11-03 ENCOUNTER — Encounter: Payer: Medicare Other | Admitting: Thoracic Surgery (Cardiothoracic Vascular Surgery)

## 2015-11-03 ENCOUNTER — Other Ambulatory Visit: Payer: Self-pay

## 2015-11-03 MED ORDER — FLUTICASONE-SALMETEROL 100-50 MCG/DOSE IN AEPB: INHALATION_SPRAY | RESPIRATORY_TRACT | Status: DC

## 2015-11-10 ENCOUNTER — Telehealth: Payer: Self-pay | Admitting: *Deleted

## 2015-11-10 ENCOUNTER — Ambulatory Visit (INDEPENDENT_AMBULATORY_CARE_PROVIDER_SITE_OTHER): Payer: Self-pay | Admitting: Thoracic Surgery (Cardiothoracic Vascular Surgery)

## 2015-11-10 VITALS — BP 96/62 | HR 96 | Resp 16 | Ht 68.0 in | Wt 126.0 lb

## 2015-11-10 DIAGNOSIS — Z85118 Personal history of other malignant neoplasm of bronchus and lung: Secondary | ICD-10-CM

## 2015-11-10 DIAGNOSIS — C3431 Malignant neoplasm of lower lobe, right bronchus or lung: Secondary | ICD-10-CM

## 2015-11-10 NOTE — Progress Notes (Signed)
Patient ID: [name], male   DOB: 30-Jun-1960, 55 y.o.   MRN: [id number]      [address]       [place] [address]             [phone]      Mr. Courtney returns today to discuss results of his CT-guided needle biopsy.  He is a 55 year old man with a history of tobacco abuse who had a right lower lobe wedge resection for a stage IB non-small cell carcinoma in 2015. He was not a candidate for a larger resection due to a history of a destroyed left upper lobe secondary to tuberculosis as a child.   He has been followed with CTs since his surgery. In August 2016 the CT showed showed some nodularity around his staple line in the vicinity of his previous tumor. There was AP window adenopathy which had been present previously.  There was no easy way to biopsy these nodules so we elected to do a repeat scan in 3 months. In November there was no significant change. We again discussed biopsy versus observation. He opted for observation. I saw him again in May in the nodularity was more prominent. There was hypermetabolic uptake on PET. There is chronic hypermetabolic uptake associated with the left lung and AP window nodes.  A CT-guided needle biopsy was done on 11/02/2015. He tolerated the procedure well without any issues.  The biopsy showed invasive adenocarcinoma.  I discussed the biopsy results with Mr. Klemann. I don't think he is a good candidate for additional surgical resection. I think our best option at this point would be radiation and hopefully stereotactic radiation would be an option. I am going to refer him to our multidisciplinary thoracic oncology clinic to see radiation oncology.  Revonda Standard Roxan Hockey, MD [facility] [phone]

## 2015-11-10 NOTE — Telephone Encounter (Signed)
Oncology Nurse Navigator Documentation  Oncology Nurse Navigator Flowsheets 11/10/2015  Treatment Phase Pre-Tx/Tx Discussion  Barriers/Navigation Needs Coordination of Care  Interventions Coordination of Care  Coordination of Care Appts  Acuity Level 1  Time Spent with Patient 19   I received a referral from Dr. Roxan Hockey today.  He needs to be set up with Rad Onc.  He saw Dr. Sondra Come in the past.  I called Mr. Becvar to schedule.  I left my name and phone number to call.

## 2015-11-11 ENCOUNTER — Other Ambulatory Visit: Payer: Self-pay

## 2015-11-11 ENCOUNTER — Telehealth: Payer: Self-pay | Admitting: *Deleted

## 2015-11-11 MED ORDER — FLUTICASONE-SALMETEROL 100-50 MCG/DOSE IN AEPB: INHALATION_SPRAY | RESPIRATORY_TRACT | Status: DC

## 2015-11-11 NOTE — Telephone Encounter (Signed)
Oncology Nurse Navigator Documentation  Oncology Nurse Navigator Flowsheets 11/11/2015  Treatment Phase Pre-Tx/Tx Discussion  Barriers/Navigation Needs Coordination of Care  Interventions Coordination of Care  Coordination of Care Appts  Acuity Level 1  Time Spent with Patient 15   I called to set up appt with Dr. Sondra Come.  I was unable to reach but left a vm message to call me with my name and phone number

## 2015-11-12 ENCOUNTER — Other Ambulatory Visit: Payer: Self-pay | Admitting: Cardiology

## 2015-11-13 ENCOUNTER — Ambulatory Visit (INDEPENDENT_AMBULATORY_CARE_PROVIDER_SITE_OTHER): Payer: Medicare Other | Admitting: Adult Health

## 2015-11-13 ENCOUNTER — Encounter: Payer: Self-pay | Admitting: Adult Health

## 2015-11-13 VITALS — BP 106/74 | HR 91 | Temp 98.2°F | Ht 68.0 in | Wt 119.0 lb

## 2015-11-13 DIAGNOSIS — C3490 Malignant neoplasm of unspecified part of unspecified bronchus or lung: Secondary | ICD-10-CM | POA: Diagnosis not present

## 2015-11-13 DIAGNOSIS — I255 Ischemic cardiomyopathy: Secondary | ICD-10-CM

## 2015-11-13 DIAGNOSIS — J449 Chronic obstructive pulmonary disease, unspecified: Secondary | ICD-10-CM | POA: Diagnosis not present

## 2015-11-13 NOTE — Patient Instructions (Signed)
Continue Spiriva and Advair .  Continue to work on not smoking . Continue follow up with Dr. Blase Mess as planned.   Follow up Dr. Chase Caller in 3-4  months and As needed   Please contact office for sooner follow up if symptoms do not improve or worsen or seek emergency care

## 2015-11-13 NOTE — Assessment & Plan Note (Signed)
Compensated on pressent regimen

## 2015-11-13 NOTE — Assessment & Plan Note (Signed)
Recurrent Lung cancer along suture line w/ adenocarcinoma on bx  Plans for XRT .

## 2015-11-13 NOTE — Telephone Encounter (Signed)
Rx(s) sent to pharmacy electronically.  
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
## 2015-11-16 ENCOUNTER — Encounter: Payer: Self-pay | Admitting: Radiation Oncology

## 2015-11-25 ENCOUNTER — Ambulatory Visit
Admission: RE | Admit: 2015-11-25 | Discharge: 2015-11-25 | Disposition: A | Discharge status: Home / Self Care | Payer: Medicare Other | Source: Ambulatory Visit | Attending: Radiation Oncology | Admitting: Radiation Oncology

## 2015-11-25 ENCOUNTER — Ambulatory Visit: Payer: Medicare Other

## 2015-11-25 ENCOUNTER — Inpatient Hospital Stay: Admission: RE | Admit: 2015-11-25 | Payer: Medicare Other | Source: Ambulatory Visit

## 2015-11-25 DIAGNOSIS — Z950 Presence of cardiac pacemaker: Secondary | ICD-10-CM | POA: Insufficient documentation

## 2015-11-25 DIAGNOSIS — Z510 Encounter for antineoplastic radiation therapy: Secondary | ICD-10-CM | POA: Insufficient documentation

## 2015-11-25 DIAGNOSIS — C3431 Malignant neoplasm of lower lobe, right bronchus or lung: Secondary | ICD-10-CM | POA: Insufficient documentation

## 2015-12-02 ENCOUNTER — Other Ambulatory Visit: Payer: Self-pay | Admitting: Cardiology

## 2015-12-10 ENCOUNTER — Ambulatory Visit
Admission: RE | Admit: 2015-12-10 | Discharge: 2015-12-10 | Disposition: A | Discharge status: Home / Self Care | Payer: Medicare Other | Source: Ambulatory Visit | Attending: Radiation Oncology | Admitting: Radiation Oncology

## 2015-12-10 ENCOUNTER — Telehealth: Payer: Self-pay | Admitting: *Deleted

## 2015-12-10 ENCOUNTER — Encounter: Payer: Self-pay | Admitting: *Deleted

## 2015-12-10 ENCOUNTER — Encounter: Payer: Self-pay | Admitting: Radiation Oncology

## 2015-12-10 VITALS — BP 108/79 | HR 97 | Temp 98.4°F | Ht 68.0 in | Wt 119.0 lb

## 2015-12-10 DIAGNOSIS — R918 Other nonspecific abnormal finding of lung field: Secondary | ICD-10-CM

## 2015-12-10 DIAGNOSIS — C3491 Malignant neoplasm of unspecified part of right bronchus or lung: Secondary | ICD-10-CM

## 2015-12-10 DIAGNOSIS — C3431 Malignant neoplasm of lower lobe, right bronchus or lung: Secondary | ICD-10-CM | POA: Diagnosis not present

## 2015-12-10 DIAGNOSIS — Z510 Encounter for antineoplastic radiation therapy: Secondary | ICD-10-CM | POA: Diagnosis not present

## 2015-12-10 DIAGNOSIS — Z950 Presence of cardiac pacemaker: Secondary | ICD-10-CM | POA: Diagnosis not present

## 2015-12-10 DIAGNOSIS — J984 Other disorders of lung: Secondary | ICD-10-CM | POA: Diagnosis not present

## 2015-12-10 NOTE — Progress Notes (Signed)
Radiation Oncology         [phone] ________________________________  Name: [name] MRN: [id number]  Date: 12/10/2015  DOB: March 27, 1961  Re-Consultation Visit Note  CC: Elyn Peers, MD  Brand Males, MD    ICD-9-CM ICD-10-CM   1. Right lower lobe lung mass 786.6 J98.4 Ambulatory referral to Social Work  2. Adenocarcinoma of lung, stage 1, right (HCC) 162.9 C34.91     Diagnosis: Recurrent vs clinical stage I non-small cell lung cancer of the right lower lobe    Narrative:  The patient returns today for a re-consultation. I previously saw the patient on 08/01/2013 in [facility] for stage IA (pT1a, pN0) non-small cell carcinoma of the RLL. 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 He has been followed with CTs since his surgery. 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  In August 2016, CT of the chested showed showed some nodularity around his staple line in the vicinity of his previous tumor. There was AP window adenopathy which had been present previously. There was no easy way to biopsy these nodules. Therefore, Dr. Roxan Hockey, elected to do a repeat scan in 3 months. In November, there was no significant change. Dr. Roxan Hockey discussed biopsy versus observation and the patient opted for observation.  On 10/20/15, repeat CT scan of the chested noted increased nodularity in the medial aspect of the RLL along the suture line consistent with lung cancer recurrence, a stable band of linear thickening in the RUL, and stable severe scarring and bronchiectasis in the LUL. The patient followed up with Dr. Roxan Hockey that day who recommended PET/CT and CT-guided biopsy.  PET on 10/23/15 noted an enlarging nodule (1.3 cm) in the medial aspect of the RLL with SUV 6.9, a band of linear thickening in the RUL with SUV 1.5, a rim of metabolic activity  in the LUL surrounding a focus of ovoid parenchymal consolidation measuring 4 x 3.4 cm with SUV 9, and several hypermetabolic high subpectoralis lymph nodes on the left.  CT-guided biopsy of the RLL nodule on 11/02/15 was positive for invasive adenocarcinoma. The patient returned to Dr. Roxan Hockey on 11/10/15 who discussed that the patient is not a good candidate for surgical resection. The patient was referred to me to discuss the role of radiation in the management of his disease. The patient has a pacemaker in his left chest.  ROS: The patient lost 7 lbs in the past month and has a cough with green sputum. He denies an increase in shortness of breath. The patient uses 2L of oxygen, but not primarily.  ALLERGIES:  has No Known Allergies.  Meds: Current Outpatient Prescriptions  Medication Sig Dispense Refill  . aspirin EC 81 MG tablet Take 1 tablet (81 mg total) by mouth daily. 30 tablet 11  . carvedilol (COREG) 3.125 MG tablet Take 1 tablet (3.125 mg total) by mouth 2 (two) times daily with a meal. 60 tablet 11  . ezetimibe (ZETIA) 10 MG tablet Take 1 tablet (10 mg total) by mouth daily. PLEASE CONTACT OFFICE FOR ADDITIONAL REFILLS 30 tablet 0  . Fluticasone-Salmeterol (ADVAIR DISKUS) 100-50 MCG/DOSE AEPB INHALE CONTENTS OF 1 BLISTER USING DISKUS TWO TIMES DAILY 180 each 5  . simvastatin (ZOCOR) 40 MG tablet TAKE ONE TABLET BY MOUTH AT BEDTIME 30 tablet 0  . SPIRIVA HANDIHALER 18 MCG inhalation capsule PLACE 1 CAPSULE  INTO HANDIHALER AND INHALE DAILY (Patient taking differently: PLACE 1 CAPSULE INTO HANDIHALER AND INHALE TWICE DAILY.) 90 capsule 1   No current facility-administered medications for this encounter.    Physical Findings: The patient is in no acute distress. Patient is alert and oriented.  height is '5\' 8"'$  (1.727 m) and weight is 119 lb (53.978 kg). His oral temperature is 98.4 F (36.9 C). His blood pressure is 108/79 and his pulse is 97. His oxygen saturation is 97%.   Small  lateral right chest wall scar from his previous lung wedge resection with no sign of infection.  Lungs are clear to auscultation bilaterally. Heart has regular rate and rhythm. No palpable cervical, supraclavicular, or axillary adenopathy. Abdomen soft, non-tender, normal bowel sounds. Patient has a automatic implantable cardioverter-defibrillator in the left upper chest  Lab Findings: Lab Results  Component Value Date   WBC 7.3 11/02/2015   HGB 14.9 11/02/2015   HCT 43.4 11/02/2015   MCV 83.3 11/02/2015   PLT 327 11/02/2015    Radiographic Findings: No results found.  Impression: Recurrent vs clinical stage I non-small cell lung cancer of the right lower lobe  The patient's RLL wedge resection on 08/07/13 revealed large cell neuroendocrine carcinoma, but the CT guided biopsy on  11/02/15 noted invasive adenocarcinoma. I have called pathology to discuss current pathology as it relates to his previous lung cancer.  The patient may be a good candidate for SBRT to the right lower lobe nodule. If he cannot be a candidate for SBRT, then he would be a good candidate for hypo-fractionated treatment.  Plan: We discussed that he has stage I lung cancer and the results of SBRT with provide a greater than 90% local control. We discussed that he may fail in the mediastinum hilum or elsewhere in the body and radiation was a local only process. We discussed the process of simulation and the use of a pad all to decrease respiratory motion. We discussed the use of 4-dimensional simulation to minimize normal lung tissue treated and the use of respiratory compression. We discussed 3- 5 treatments occurring every other day as an outpatient. We discussed these treatments will last about 10-20 minutes and he would be here at the hospital for about an hour. We discussed that SBRT was unlikely to make his breathing any worse. It is also unlikely to make his breathing symptoms any better. We discussed possible side  effects including shoulder pain due to arm positioning. We discussed damage to other critical normal structures including heart, ribs, lung collapse, chronic cough. We discussed that without treatment this  could develop into a more aggressive or even metastatic cancer.   The patient signed a consent form and this was placed in his medical chart. CT simulation is scheduled on 12/21/15 at [facility].  ____________________________________ -----------------------------------  Blair Promise, PhD, MD  This document serves as a record of services personally performed by Gery Pray, MD. It was created on his behalf by Darcus Austin, a trained medical scribe. The creation of this record is based on the scribe's personal observations and the provider's statements to them. This document has been checked and approved by the attending provider.

## 2015-12-10 NOTE — Progress Notes (Signed)
Thoracic Location of Tumor / Histology: Recurrent Lung cancer along suture line w/ adenocarcinoma on bx    Patient presented with increased nodularity in the right lung on a CT scan from 10/20/15  Biopsies revealed:   11/02/15 Diagnosis Lung, needle/core biopsy(ies), RLL Nodule - INVASIVE ADENOCARCINOMA. - SEE COMMENT.  Tobacco/Marijuana/Snuff/ETOH use: current every day smoker 0.5 ppd, has smoked 1 ppd for 38 years.  He is not currently drinking ETOH.  Currently smoking smoking marijuana.  Past/Anticipated interventions by cardiothoracic surgery, if any: patient is not a surgical candidate.  Past/Anticipated interventions by medical oncology, if any: no  Signs/Symptoms  Weight changes, if any: yes - has lost about 5 lbs in the last month.  Reports he has never had a good appetite.  Respiratory complaints, if any: yes - has a cough with green sputum.  He denies an increase in shortness of breath.  Hemoptysis, if any: no  Pain issues, if any:  no  SAFETY ISSUES:  Prior radiation? no  Pacemaker/ICD? Yes - right chest   Possible current pregnancy?no  Is the patient on methotrexate? no  Current Complaints / other details:    BP 108/79 mmHg  Pulse 97  Temp(Src) 98.4 F (36.9 C) (Oral)  Ht '5\' 8"'$  (1.727 m)  Wt 119 lb (53.978 kg)  BMI 18.10 kg/m2  SpO2 97%   Wt Readings from Last 3 Encounters:  12/10/15 119 lb (53.978 kg)  11/13/15 119 lb (53.978 kg)  11/10/15 126 lb (57.153 kg)

## 2015-12-10 NOTE — Progress Notes (Signed)
  Oncology Nurse Navigator Documentation  Navigator Location: [facility] (12/10/15 1530) Navigator Encounter Type: Follow-up Appt (12/10/15 1530)  Pt at [facility] for appointment with Dr. Sondra Come to discuss radiation treatments.  Patient reports that he does not have any questions or concerns at this time.  I gave him my card with contact information again.  I encouraged him to call me for any needs.  Patient verbalized understanding.           Treatment Phase: Treatment (12/10/15 1530) Barriers/Navigation Needs: No barriers at this time (12/10/15 1530)   Interventions: None required (12/10/15 1530)            Acuity: Level 2 (12/10/15 1530)   Acuity Level 2: Initial guidance, education and coordination as needed;Educational needs;Assistance expediting appointments;Ongoing guidance and education throughout treatment as needed (12/10/15 1530)     Time Spent with Patient: 15 (12/10/15 1530)

## 2015-12-10 NOTE — Telephone Encounter (Signed)
  Oncology Nurse Navigator Documentation  Navigator Location: [facility] (12/10/15 0930) Navigator Encounter Type: Telephone (12/10/15 0930)  I called patient to remind him of his appointment with Dr. Sondra Come.  I left a message on his voicemail with call back information.           Treatment Phase: Treatment (12/10/15 0930)

## 2015-12-21 ENCOUNTER — Telehealth: Payer: Self-pay | Admitting: Cardiology

## 2015-12-21 ENCOUNTER — Encounter: Payer: Medicare Other | Admitting: *Deleted

## 2015-12-21 ENCOUNTER — Ambulatory Visit: Admission: RE | Admit: 2015-12-21 | Payer: Medicare Other | Source: Ambulatory Visit | Admitting: Radiation Oncology

## 2015-12-21 NOTE — Telephone Encounter (Signed)
LMOVM reminding pt to send remote transmission.   

## 2015-12-22 ENCOUNTER — Telehealth: Payer: Self-pay | Admitting: *Deleted

## 2015-12-22 NOTE — Telephone Encounter (Signed)
Patient called for assistance with sending manual Merlin transmission.  Unable to get monitor to connect to cell tower.  Advised patient to call [employer] and gave phone number.  Patient verbalizes understanding and states he will call to troubleshoot it.  He is appreciative and denies additional questions or concerns at this time.

## 2015-12-25 ENCOUNTER — Encounter: Payer: Self-pay | Admitting: Cardiology

## 2015-12-25 ENCOUNTER — Ambulatory Visit (INDEPENDENT_AMBULATORY_CARE_PROVIDER_SITE_OTHER): Payer: Medicare Other | Admitting: *Deleted

## 2015-12-25 DIAGNOSIS — I255 Ischemic cardiomyopathy: Secondary | ICD-10-CM

## 2015-12-25 DIAGNOSIS — Z95810 Presence of automatic (implantable) cardiac defibrillator: Secondary | ICD-10-CM

## 2015-12-25 NOTE — Telephone Encounter (Signed)
Manual transmission successfully received.

## 2015-12-25 NOTE — Telephone Encounter (Signed)
LMOVM advising that [employer]ire home monitor software shows that it has updated, but that he still needs to send a manual transmission for review.  Gave instructions for sending manual transmission.  Gave device clinic phone number for additional questions/concerns.

## 2015-12-25 NOTE — Progress Notes (Signed)
Remote ICD transmission.   

## 2015-12-28 ENCOUNTER — Encounter: Payer: Self-pay | Admitting: Cardiology

## 2015-12-29 ENCOUNTER — Ambulatory Visit
Admission: RE | Admit: 2015-12-29 | Discharge: 2015-12-29 | Disposition: A | Discharge status: Home / Self Care | Payer: Medicare Other | Source: Ambulatory Visit | Attending: Radiation Oncology | Admitting: Radiation Oncology

## 2015-12-29 DIAGNOSIS — Z950 Presence of cardiac pacemaker: Secondary | ICD-10-CM | POA: Diagnosis not present

## 2015-12-29 DIAGNOSIS — Z510 Encounter for antineoplastic radiation therapy: Secondary | ICD-10-CM | POA: Diagnosis not present

## 2015-12-29 DIAGNOSIS — C3491 Malignant neoplasm of unspecified part of right bronchus or lung: Secondary | ICD-10-CM

## 2015-12-29 DIAGNOSIS — C3431 Malignant neoplasm of lower lobe, right bronchus or lung: Secondary | ICD-10-CM | POA: Diagnosis not present

## 2015-12-29 NOTE — Progress Notes (Signed)
  Radiation Oncology         [phone] ________________________________  [name]  STEREOTACTIC BODY RADIOTHERAPY SIMULATION AND TREATMENT PLANNING NOTE    DIAGNOSIS:  Recurrent stage I non-small cell lung cancer of the right lower lobe  NARRATIVE:  The patient was brought to the [facility].  Identity was confirmed.  All relevant records and images related to the planned course of therapy were reviewed.  The patient freely provided informed written consent to proceed with treatment after reviewing the details related to the planned course of therapy. The consent form was witnessed and verified by the simulation staff.  Then, the patient was set-up in a stable reproducible  supine position for radiation therapy.  A BodyFix immobilization pillow was fabricated for reproducible positioning.  Then I personally applied the abdominal compression paddle to limit respiratory excursion.  4D respiratoy motion management CT images were obtained.  Surface markings were placed.  The CT images were loaded into the planning software.  Then, using Cine, MIP, and standard views, the internal target volume (ITV) and planning target volumes (PTV) were delinieated, and avoidance structures were contoured.  Treatment planning then occurred.  The radiation prescription was entered and confirmed.  A total of two complex treatment devices were fabricated in the form of the BodyFix immobilization pillow and a neck accuform cushion.  I have requested : 3D Simulation  I have requested a DVH of the following structures: Heart, Lungs, Esophagus, Chest Wall, Brachial Plexus, Major Blood Vessels, and targets.  PLAN:  The patient will receive 54 Gy in 3 fractions.  -----------------------------------  Garrett Promise, PhD, MD  This document serves as a record of services personally performed by Gery Pray, MD. It was created on his behalf by Darcus Austin, a trained medical scribe. The creation of  this record is based on the scribe's personal observations and the provider's statements to them. This document has been checked and approved by the attending provider.

## 2015-12-30 ENCOUNTER — Other Ambulatory Visit: Payer: Self-pay | Admitting: Internal Medicine

## 2015-12-30 ENCOUNTER — Other Ambulatory Visit: Payer: Self-pay | Admitting: Cardiology

## 2015-12-30 NOTE — Telephone Encounter (Signed)
Rx request sent to pharmacy.  

## 2016-01-01 LAB — CUP PACEART REMOTE DEVICE CHECK
Battery Remaining Percentage: 78 %
Date Time Interrogation Session: 20170728141150
HIGH POWER IMPEDANCE MEASURED VALUE: 61 Ohm
HIGH POWER IMPEDANCE MEASURED VALUE: 61 Ohm
Implantable Lead Implant Date: 20140929
Implantable Lead Location: 753860
Lead Channel Impedance Value: 350 Ohm
Lead Channel Pacing Threshold Pulse Width: 0.5 ms
Lead Channel Sensing Intrinsic Amplitude: 11.3 mV
MDC IDC LEAD MODEL: 181
MDC IDC LEAD SERIAL: 325827
MDC IDC MSMT BATTERY REMAINING LONGEVITY: 78 mo
MDC IDC MSMT BATTERY VOLTAGE: 3.01 V
MDC IDC MSMT LEADCHNL RV PACING THRESHOLD AMPLITUDE: 0.75 V
MDC IDC SET LEADCHNL RV PACING AMPLITUDE: 2.5 V
MDC IDC SET LEADCHNL RV PACING PULSEWIDTH: 0.5 ms
MDC IDC SET LEADCHNL RV SENSING SENSITIVITY: 0.5 mV
MDC IDC STAT BRADY RV PERCENT PACED: 1 %
Pulse Gen Serial Number: 7132440

## 2016-01-04 DIAGNOSIS — Z950 Presence of cardiac pacemaker: Secondary | ICD-10-CM | POA: Diagnosis not present

## 2016-01-04 DIAGNOSIS — Z510 Encounter for antineoplastic radiation therapy: Secondary | ICD-10-CM | POA: Diagnosis not present

## 2016-01-04 DIAGNOSIS — C3431 Malignant neoplasm of lower lobe, right bronchus or lung: Secondary | ICD-10-CM | POA: Diagnosis not present

## 2016-01-07 ENCOUNTER — Ambulatory Visit: Payer: Medicare Other | Admitting: Radiation Oncology

## 2016-01-11 ENCOUNTER — Ambulatory Visit: Payer: Medicare Other | Admitting: Radiation Oncology

## 2016-01-12 ENCOUNTER — Ambulatory Visit: Admission: RE | Admit: 2016-01-12 | Payer: Medicare Other | Source: Ambulatory Visit | Admitting: Radiation Oncology

## 2016-01-12 ENCOUNTER — Ambulatory Visit: Payer: Medicare Other | Admitting: Radiation Oncology

## 2016-01-13 ENCOUNTER — Ambulatory Visit: Payer: Medicare Other | Admitting: Radiation Oncology

## 2016-01-14 ENCOUNTER — Ambulatory Visit
Admission: RE | Admit: 2016-01-14 | Discharge: 2016-01-14 | Disposition: A | Discharge status: Home / Self Care | Payer: Medicare Other | Source: Ambulatory Visit | Attending: Radiation Oncology | Admitting: Radiation Oncology

## 2016-01-14 DIAGNOSIS — Z510 Encounter for antineoplastic radiation therapy: Secondary | ICD-10-CM | POA: Diagnosis not present

## 2016-01-14 DIAGNOSIS — C3431 Malignant neoplasm of lower lobe, right bronchus or lung: Secondary | ICD-10-CM | POA: Diagnosis not present

## 2016-01-14 DIAGNOSIS — Z950 Presence of cardiac pacemaker: Secondary | ICD-10-CM | POA: Diagnosis not present

## 2016-01-14 DIAGNOSIS — C3491 Malignant neoplasm of unspecified part of right bronchus or lung: Secondary | ICD-10-CM

## 2016-01-14 NOTE — Progress Notes (Signed)
  Radiation Oncology         [phone] ________________________________  Name: [name] MRN: [id number]  Date: 01/14/2016  DOB: 1961/02/03  Stereotactic Body Radiotherapy Treatment Procedure Note:  18 Gy of Planned Dose 54 Gy   NARRATIVE:  [name] was brought to the stereotactic radiation treatment machine and placed supine on the CT couch. The patient was set up for stereotactic body radiotherapy on the body fix pillow.  3D TREATMENT PLANNING AND DOSIMETRY:  The patient's radiation plan was reviewed and approved prior to starting treatment.  It showed 3-dimensional radiation distributions overlaid onto the planning CT.  The [facility] for the target structures as well as the organs at risk were reviewed. The documentation of this is filed in the radiation oncology EMR.  SIMULATION VERIFICATION:  The patient underwent CT imaging on the treatment unit.  These were carefully aligned to document that the ablative radiation dose would cover the target volume and maximally spare the nearby organs at risk according to the planned distribution.  SPECIAL TREATMENT PROCEDURE: [name] received high dose ablative stereotactic body radiotherapy to the planned target volume without unforeseen complications. Treatment was delivered uneventfully. The high doses associated with stereotactic body radiotherapy and the significant potential risks require careful treatment set up and patient monitoring constituting a special treatment procedure   STEREOTACTIC TREATMENT MANAGEMENT:  Following delivery, the patient was evaluated clinically. The patient tolerated treatment without significant acute effects, and was discharged to home in stable condition.    PLAN: Continue treatment as planned.  ________________________________  Blair Promise, PhD, MD  This document serves as a record of services personally performed by Gery Pray, MD. It was created on his behalf by Darcus Austin, a trained  medical scribe. The creation of this record is based on the scribe's personal observations and the provider's statements to them. This document has been checked and approved by the attending provider.

## 2016-01-15 ENCOUNTER — Ambulatory Visit: Payer: Medicare Other | Admitting: Radiation Oncology

## 2016-01-18 ENCOUNTER — Ambulatory Visit
Admission: RE | Admit: 2016-01-18 | Discharge: 2016-01-18 | Disposition: A | Discharge status: Home / Self Care | Payer: Medicare Other | Source: Ambulatory Visit | Attending: Radiation Oncology | Admitting: Radiation Oncology

## 2016-01-18 DIAGNOSIS — Z950 Presence of cardiac pacemaker: Secondary | ICD-10-CM | POA: Diagnosis not present

## 2016-01-18 DIAGNOSIS — C3491 Malignant neoplasm of unspecified part of right bronchus or lung: Secondary | ICD-10-CM

## 2016-01-18 DIAGNOSIS — Z510 Encounter for antineoplastic radiation therapy: Secondary | ICD-10-CM | POA: Diagnosis not present

## 2016-01-18 DIAGNOSIS — C3431 Malignant neoplasm of lower lobe, right bronchus or lung: Secondary | ICD-10-CM | POA: Diagnosis not present

## 2016-01-20 ENCOUNTER — Encounter: Payer: Self-pay | Admitting: Radiation Oncology

## 2016-01-20 ENCOUNTER — Ambulatory Visit
Admission: RE | Admit: 2016-01-20 | Discharge: 2016-01-20 | Disposition: A | Discharge status: Home / Self Care | Payer: Medicare Other | Source: Ambulatory Visit | Attending: Radiation Oncology | Admitting: Radiation Oncology

## 2016-01-20 VITALS — BP 112/73 | HR 76 | Temp 98.5°F | Ht 68.0 in | Wt 122.8 lb

## 2016-01-20 DIAGNOSIS — C3491 Malignant neoplasm of unspecified part of right bronchus or lung: Secondary | ICD-10-CM

## 2016-01-20 DIAGNOSIS — Z950 Presence of cardiac pacemaker: Secondary | ICD-10-CM | POA: Diagnosis not present

## 2016-01-20 DIAGNOSIS — C3431 Malignant neoplasm of lower lobe, right bronchus or lung: Secondary | ICD-10-CM | POA: Diagnosis not present

## 2016-01-20 DIAGNOSIS — Z510 Encounter for antineoplastic radiation therapy: Secondary | ICD-10-CM | POA: Diagnosis not present

## 2016-01-20 NOTE — Progress Notes (Signed)
  Radiation Oncology         [phone] ________________________________  Name: [name] MRN: [id number]  Date: 01/20/2016  DOB: 1960/12/01  Stereotactic Body Radiotherapy Treatment Procedure Note  NARRATIVE:  [name] was brought to the stereotactic radiation treatment machine and placed supine on the CT couch. The patient was set up for stereotactic body radiotherapy on the body fix pillow.  3D TREATMENT PLANNING AND DOSIMETRY:  The patient's radiation plan was reviewed and approved prior to starting treatment.  It showed 3-dimensional radiation distributions overlaid onto the planning CT.  The [facility] for the target structures as well as the organs at risk were reviewed. The documentation of this is filed in the radiation oncology EMR.  SIMULATION VERIFICATION:  The patient underwent CT imaging on the treatment unit.  These were carefully aligned to document that the ablative radiation dose would cover the target volume and maximally spare the nearby organs at risk according to the planned distribution.  SPECIAL TREATMENT PROCEDURE: [name] received high dose ablative stereotactic body radiotherapy to the planned target volume without unforeseen complications. Treatment was delivered uneventfully. The high doses associated with stereotactic body radiotherapy and the significant potential risks require careful treatment set up and patient monitoring constituting a special treatment procedure   STEREOTACTIC TREATMENT MANAGEMENT:  Following delivery, the patient was evaluated clinically. The patient tolerated treatment without significant acute effects, and was discharged to home in stable condition.    PLAN: Continue treatment as planned.  ________________________________  Blair Promise, PhD, MD

## 2016-01-20 NOTE — Progress Notes (Signed)
[name] has completed treatment with 3 fractions to his right lower lung.  He denies having pan.  He reports an occasional cough with brown sputum in the mornings which clears up later in the day.  He denies having hemoptysis.  He denies having any increase in shortness of breath.  He has been given a one month follow up appointment.  BP 112/73 (BP Location: Right Arm, Patient Position: Sitting)   Pulse 76   Temp 98.5 F (36.9 C) (Oral)   Ht '5\' 8"'$  (1.727 m)   Wt 122 lb 12.8 oz (55.7 kg)   SpO2 100%   BMI 18.67 kg/m    Wt Readings from Last 3 Encounters:  01/20/16 122 lb 12.8 oz (55.7 kg)  12/10/15 119 lb (54 kg)  11/13/15 119 lb (54 kg)

## 2016-01-20 NOTE — Progress Notes (Signed)
  Radiation Oncology         [phone] ________________________________  Name: Kaulana Brindle MRN: [id number]  Date: 01/20/2016  DOB: July 15, 1960  Stereotactic Body Radiotherapy Treatment Procedure Note  NARRATIVE:  Austin Herd was brought to the stereotactic radiation treatment machine and placed supine on the CT couch. The patient was set up for stereotactic body radiotherapy on the body fix pillow.  3D TREATMENT PLANNING AND DOSIMETRY:  The patient's radiation plan was reviewed and approved prior to starting treatment.  It showed 3-dimensional radiation distributions overlaid onto the planning CT.  The [facility] for the target structures as well as the organs at risk were reviewed. The documentation of this is filed in the radiation oncology EMR.  SIMULATION VERIFICATION:  The patient underwent CT imaging on the treatment unit.  These were carefully aligned to document that the ablative radiation dose would cover the target volume and maximally spare the nearby organs at risk according to the planned distribution.  SPECIAL TREATMENT PROCEDURE: [name] received high dose ablative stereotactic body radiotherapy to the planned target volume without unforeseen complications. Treatment was delivered uneventfully. The high doses associated with stereotactic body radiotherapy and the significant potential risks require careful treatment set up and patient monitoring constituting a special treatment procedure   STEREOTACTIC TREATMENT MANAGEMENT:  Following delivery, the patient was evaluated clinically. The patient tolerated treatment without significant acute effects, and was discharged to home in stable condition.    PLAN: Continue treatment as planned.  ________________________________  Blair Promise, PhD, MD

## 2016-01-20 NOTE — Progress Notes (Signed)
  Radiation Oncology         [phone] ________________________________  Name: [name] MRN: [id number]  Date: 01/20/2016  DOB: 27-Sep-1960  Weekly Radiation Therapy Management    ICD-9-CM ICD-10-CM   1. Adenocarcinoma of lung, stage 1, right (HCC) 162.9 C34.91      Current Dose: 54Gy     Planned Dose:  54 Gy  Narrative . . . . . . . . The patient presents for routine under treatment assessment.                                   The patient is without complaint. He has tolerated his radiation therapy quite well without any side effects.                                 Set-up films were reviewed.                                 The chart was checked. Physical Findings. . .  height is '5\' 8"'$  (1.727 m) and weight is 122 lb 12.8 oz (55.7 kg). His oral temperature is 98.5 F (36.9 C). His blood pressure is 112/73 and his pulse is 76. His oxygen saturation is 100%. . The lungs are clear. The heart has a regular rhythm and rate  Impression . . . . . . . The patient is tolerated radiation. Plan . . . . . . . . . . . . Routine follow-up in approximately 1 month.   ________________________________   Blair Promise, PhD, MD

## 2016-01-21 NOTE — Progress Notes (Signed)
  Radiation Oncology         [phone] ________________________________  Name: [name] MRN: [id number]  Date: 01/20/2016  DOB: September 22, 1960  End of Treatment Note  Diagnosis: Recurrent stage I non-small cell lung cancer of the right lower lobe     Indication for treatment: Curative  Radiation treatment dates: 01/14/16, 01/18/16, 01/20/16  Site/dose: 54 Gy in 3 fractions to the right lower lung.  Beams/energy: SBRT/SRT-3D // 6FFF Photon  Narrative: The patient tolerated radiation treatment relatively well.  Plan: The patient has completed radiation treatment. The patient will return to radiation oncology clinic for routine followup in one month. I advised them to call or return sooner if they have any questions or concerns related to their recovery or treatment.  -----------------------------------  Blair Promise, PhD, MD  This document serves as a record of services personally performed by Gery Pray, MD. It was created on his behalf by Darcus Austin, a trained medical scribe. The creation of this record is based on the scribe's personal observations and the provider's statements to them. This document has been checked and approved by the attending provider.

## 2016-01-22 ENCOUNTER — Ambulatory Visit: Admission: RE | Admit: 2016-01-22 | Payer: Medicare Other | Source: Ambulatory Visit | Admitting: Radiation Oncology

## 2016-01-26 ENCOUNTER — Other Ambulatory Visit: Payer: Self-pay | Admitting: Cardiology

## 2016-01-26 NOTE — Telephone Encounter (Signed)
Rx request sent to pharmacy.  

## 2016-02-03 ENCOUNTER — Encounter: Payer: Self-pay | Admitting: *Deleted

## 2016-02-03 NOTE — Progress Notes (Signed)
[place] Psychosocial Distress Screening Clinical Social Work  Clinical Social Work was referred by distress screening protocol.  The patient scored a 5 on the Psychosocial Distress Thermometer which indicates moderate distress. Clinical Social Worker attempted to contact patient by phone to assess for distress and other psychosocial needs. CSW left voicemail for patient.  ONCBCN DISTRESS SCREENING 12/10/2015  Screening Type Initial Screening  Distress experienced in past week (1-10) 5  Practical problem type Housing;Transportation  Emotional problem type Nervousness/Anxiety    Polo Riley, MSW, LCSW, OSW-C Clinical Social Worker [facility] [phone]

## 2016-02-04 ENCOUNTER — Encounter (HOSPITAL_COMMUNITY): Payer: Self-pay | Admitting: Emergency Medicine

## 2016-02-04 DIAGNOSIS — Z79899 Other long term (current) drug therapy: Secondary | ICD-10-CM | POA: Insufficient documentation

## 2016-02-04 DIAGNOSIS — F17210 Nicotine dependence, cigarettes, uncomplicated: Secondary | ICD-10-CM | POA: Insufficient documentation

## 2016-02-04 DIAGNOSIS — I252 Old myocardial infarction: Secondary | ICD-10-CM | POA: Insufficient documentation

## 2016-02-04 DIAGNOSIS — Z7982 Long term (current) use of aspirin: Secondary | ICD-10-CM | POA: Insufficient documentation

## 2016-02-04 DIAGNOSIS — Z85118 Personal history of other malignant neoplasm of bronchus and lung: Secondary | ICD-10-CM | POA: Insufficient documentation

## 2016-02-04 DIAGNOSIS — Z951 Presence of aortocoronary bypass graft: Secondary | ICD-10-CM | POA: Diagnosis not present

## 2016-02-04 DIAGNOSIS — J449 Chronic obstructive pulmonary disease, unspecified: Secondary | ICD-10-CM | POA: Diagnosis not present

## 2016-02-04 DIAGNOSIS — I110 Hypertensive heart disease with heart failure: Secondary | ICD-10-CM | POA: Insufficient documentation

## 2016-02-04 DIAGNOSIS — M79604 Pain in right leg: Secondary | ICD-10-CM | POA: Insufficient documentation

## 2016-02-04 DIAGNOSIS — I2510 Atherosclerotic heart disease of native coronary artery without angina pectoris: Secondary | ICD-10-CM | POA: Insufficient documentation

## 2016-02-04 DIAGNOSIS — I5022 Chronic systolic (congestive) heart failure: Secondary | ICD-10-CM | POA: Diagnosis not present

## 2016-02-04 NOTE — ED Triage Notes (Signed)
Pt here with tingling/numbness/pain to right calf- from back of knee to ankle. PT also reports he was diagnosed with foot droop 1 year ago and it never fully resolved. Pt sts that he has a blister on his right foot as well.

## 2016-02-05 ENCOUNTER — Emergency Department (HOSPITAL_COMMUNITY)
Admission: EM | Admit: 2016-02-05 | Discharge: 2016-02-05 | Disposition: A | Discharge status: Home / Self Care | Payer: Medicare Other | Attending: Emergency Medicine | Admitting: Emergency Medicine

## 2016-02-05 ENCOUNTER — Ambulatory Visit (HOSPITAL_BASED_OUTPATIENT_CLINIC_OR_DEPARTMENT_OTHER)
Admission: RE | Admit: 2016-02-05 | Discharge: 2016-02-05 | Disposition: A | Discharge status: Home / Self Care | Payer: Medicare Other | Source: Ambulatory Visit | Attending: Emergency Medicine | Admitting: Emergency Medicine

## 2016-02-05 DIAGNOSIS — M79604 Pain in right leg: Secondary | ICD-10-CM

## 2016-02-05 DIAGNOSIS — M79609 Pain in unspecified limb: Secondary | ICD-10-CM | POA: Diagnosis not present

## 2016-02-05 MED ORDER — TRAMADOL HCL 50 MG PO TABS
50.0000 mg | ORAL_TABLET | Freq: Once | ORAL | Status: AC
  Administered 2016-02-05: 50 mg via ORAL
  Filled 2016-02-05: qty 1

## 2016-02-05 MED ORDER — TRAMADOL HCL 50 MG PO TABS: 50.0000 mg | ORAL_TABLET | Freq: Four times a day (QID) | ORAL | 0 refills | Status: DC | PRN
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
## 2016-02-05 NOTE — Progress Notes (Signed)
Preliminary results by tech - Right Lower Ext. Venous Duplex Completed. Negative for deep and superficial vein thrombosis in the right leg.  Doctor Sheahan, BS, RDMS, RVT  

## 2016-02-15 ENCOUNTER — Ambulatory Visit: Payer: Medicare Other | Admitting: Internal Medicine

## 2016-02-15 DIAGNOSIS — M21372 Foot drop, left foot: Secondary | ICD-10-CM | POA: Diagnosis not present

## 2016-02-18 ENCOUNTER — Ambulatory Visit (INDEPENDENT_AMBULATORY_CARE_PROVIDER_SITE_OTHER): Payer: Medicare Other | Admitting: Internal Medicine

## 2016-02-18 ENCOUNTER — Ambulatory Visit: Payer: Medicare Other | Admitting: Radiation Oncology

## 2016-02-18 ENCOUNTER — Encounter: Payer: Self-pay | Admitting: Internal Medicine

## 2016-02-18 VITALS — BP 100/64 | HR 89 | Ht 68.0 in | Wt 123.8 lb

## 2016-02-18 DIAGNOSIS — Z23 Encounter for immunization: Secondary | ICD-10-CM

## 2016-02-18 DIAGNOSIS — J449 Chronic obstructive pulmonary disease, unspecified: Secondary | ICD-10-CM

## 2016-02-18 DIAGNOSIS — Z720 Tobacco use: Secondary | ICD-10-CM

## 2016-02-18 DIAGNOSIS — F17200 Nicotine dependence, unspecified, uncomplicated: Secondary | ICD-10-CM

## 2016-02-18 DIAGNOSIS — I255 Ischemic cardiomyopathy: Secondary | ICD-10-CM

## 2016-02-18 NOTE — Patient Instructions (Addendum)
ICD-9-CM ICD-10-CM   1. Chronic obstructive pulmonary disease, unspecified COPD type ([place]) 496 J44.9   2. Smoker 305.1 Z72.0      #Smoking  -glad you are working on quitting   #COPD - stable - continue spiriva as before - add advair 100/50, 1 puff twice daily - flu shot 02/18/2016    #cancer surveillance - recurrence Right lower lobe June 2017  - through rad onc  #Followup  -return to seee my NP Tammy PArrett in 6 months
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
## 2016-02-26 ENCOUNTER — Other Ambulatory Visit: Payer: Self-pay | Admitting: Cardiology

## 2016-02-29 NOTE — Telephone Encounter (Signed)
Rx request sent to pharmacy.  

## 2016-03-02 ENCOUNTER — Encounter: Payer: Self-pay | Admitting: Oncology

## 2016-03-02 ENCOUNTER — Encounter: Payer: Self-pay | Admitting: Podiatry

## 2016-03-02 ENCOUNTER — Ambulatory Visit (INDEPENDENT_AMBULATORY_CARE_PROVIDER_SITE_OTHER): Payer: Medicare Other

## 2016-03-02 ENCOUNTER — Ambulatory Visit (INDEPENDENT_AMBULATORY_CARE_PROVIDER_SITE_OTHER): Payer: Medicare Other | Admitting: Podiatry

## 2016-03-02 VITALS — BP 113/72 | HR 84 | Resp 16

## 2016-03-02 DIAGNOSIS — M21371 Foot drop, right foot: Secondary | ICD-10-CM | POA: Diagnosis not present

## 2016-03-02 DIAGNOSIS — M79671 Pain in right foot: Secondary | ICD-10-CM

## 2016-03-02 DIAGNOSIS — L851 Acquired keratosis [keratoderma] palmaris et plantaris: Secondary | ICD-10-CM | POA: Diagnosis not present

## 2016-03-02 DIAGNOSIS — M79672 Pain in left foot: Secondary | ICD-10-CM

## 2016-03-02 DIAGNOSIS — I255 Ischemic cardiomyopathy: Secondary | ICD-10-CM | POA: Diagnosis not present

## 2016-03-02 DIAGNOSIS — L84 Corns and callosities: Secondary | ICD-10-CM | POA: Diagnosis not present

## 2016-03-03 ENCOUNTER — Encounter: Payer: Self-pay | Admitting: Radiation Oncology

## 2016-03-03 ENCOUNTER — Ambulatory Visit
Admission: RE | Admit: 2016-03-03 | Discharge: 2016-03-03 | Disposition: A | Discharge status: Home / Self Care | Payer: Medicare Other | Source: Ambulatory Visit | Attending: Radiation Oncology | Admitting: Radiation Oncology

## 2016-03-03 ENCOUNTER — Telehealth: Payer: Self-pay | Admitting: *Deleted

## 2016-03-03 DIAGNOSIS — Z79899 Other long term (current) drug therapy: Secondary | ICD-10-CM | POA: Insufficient documentation

## 2016-03-03 DIAGNOSIS — Z7982 Long term (current) use of aspirin: Secondary | ICD-10-CM | POA: Insufficient documentation

## 2016-03-03 DIAGNOSIS — C3491 Malignant neoplasm of unspecified part of right bronchus or lung: Secondary | ICD-10-CM | POA: Diagnosis not present

## 2016-03-03 NOTE — Progress Notes (Signed)
  Radiation Oncology         [phone] ________________________________  Name: [name] MRN: [id number]  Date: 03/03/2016  DOB: May 18, 1961  Follow-Up Visit Note  CC: Elyn Peers, MD  Melrose Nakayama, *    ICD-9-CM ICD-10-CM   1. Adenocarcinoma of right lung, stage 1 (HCC) 162.9 C34.91     Diagnosis:   Stage I adenocarcinoma of the right lung  Interval Since Last Radiation:  1 month, 2 weeks SBRT Treatment from 01/14/16-01/20/16  Narrative:  The patient returns today for routine follow-up after SBRT treatment to his right lower lobe. He denies pain other than stiffness left over from surgery in his right side. He reports having a dry cough and shortness of breath. He notes this has not worsened. He reports having a good appetite and energy level.                          ALLERGIES:  has No Known Allergies.  Meds: Current Outpatient Prescriptions  Medication Sig Dispense Refill  . aspirin EC 81 MG tablet Take 1 tablet (81 mg total) by mouth daily. 30 tablet 11  . carvedilol (COREG) 3.125 MG tablet Take 1 tablet (3.125 mg total) by mouth 2 (two) times daily with a meal. 60 tablet 11  . Fluticasone-Salmeterol (ADVAIR DISKUS) 100-50 MCG/DOSE AEPB INHALE CONTENTS OF 1 BLISTER USING DISKUS TWO TIMES DAILY 180 each 5  . simvastatin (ZOCOR) 40 MG tablet Take 1 tablet (40 mg total) by mouth at bedtime. Please schedule appointment for refills. 30 tablet 0  . SPIRIVA HANDIHALER 18 MCG inhalation capsule PLACE 1 CAPSULE INTO HANDIHALER AND INHALE DAILY 90 capsule 0  . ZETIA 10 MG tablet TAKE ONE TABLET BY MOUTH EVERY DAY .PLEASE CONTACT OFFICE FORADDITIONAL REFILLS 30 tablet 11   No current facility-administered medications for this encounter.     Physical Findings: The patient is in no acute distress. Patient is alert and oriented.  height is '5\' 8"'$  (1.727 m) and weight is 127 lb (57.6 kg). His oral temperature is 98.9 F (37.2 C). His blood pressure is 125/75 and his pulse is  74. His oxygen saturation is 97%. .  No significant changes. Lungs are clear to auscultation bilaterally. Heart has regular rate and rhythm. No palpable cervical, supraclavicular, or axillary adenopathy. Abdomen soft, non-tender, normal bowel sounds.   Lab Findings: Lab Results  Component Value Date   WBC 7.3 11/02/2015   HGB 14.9 11/02/2015   HCT 43.4 11/02/2015   MCV 83.3 11/02/2015   PLT 327 11/02/2015    Radiographic Findings: No results found.  Impression:  The patient is recovering from the effects of radiation. Doing well without residual side effects from treatment. No evidence of recurrence on clinical exam.  Plan:  Follow up in approximately 3 months for CT scan and clinical exam.  ____________________________________  This document serves as a record of services personally performed by Gery Pray, MD. It was created on his behalf by Bethann Humble, a trained medical scribe. The creation of this record is based on the scribe's personal observations and the provider's statements to them. This document has been checked and approved by the attending provider.

## 2016-03-03 NOTE — Progress Notes (Signed)
  Oncology Nurse Navigator Documentation  Navigator Location: [facility] (03/03/16 3005) Navigator Encounter Type: Telephone (Left voicemail message with contact info.) (03/03/16 0857)  Called patient to make sure he had received message about his appointment with Dr. Sondra Come this afternoon.  Voicemail message left with contact information to call back with questions.         Patient Visit Type: RadOnc (03/03/16 0857) Treatment Phase: Post-Tx Follow-up (03/03/16 0857)     Interventions: Other (Called patient to remind him of his RadOnc appt. today. ) (03/03/16 1102)                      Time Spent with Patient: 15 (03/03/16 0857)

## 2016-03-03 NOTE — Progress Notes (Signed)
[name] is here for follow up after SBRT to his right lower lobe.  He denies pain other than stiffness left over from surgery in his right side.  He reports having a dry cough and shortness of breath.  He said that this is not any worse.  He reports having a good appetite and energy level.  BP 125/75 (BP Location: Right Arm, Patient Position: Sitting)   Pulse 74   Temp 98.9 F (37.2 C) (Oral)   Ht '5\' 8"'$  (1.727 m)   Wt 127 lb (57.6 kg)   SpO2 97%   BMI 19.31 kg/m   Wt Readings from Last 3 Encounters:  03/03/16 127 lb (57.6 kg)  02/18/16 123 lb 12.8 oz (56.2 kg)  02/04/16 122 lb (55.3 kg)

## 2016-03-05 NOTE — Progress Notes (Signed)
Subjective: Patient presents to the office today for chief complaint of painful callus lesions of the feet. Patient states that the pain is ongoing and is affecting their ability to ambulate without pain. Patient presents today for further treatment and evaluation. Patient also has a complaint for dropfoot to the right lower extremity.  Objective:  Physical Exam General: Alert and oriented x3 in no acute distress  Dermatology: Hyperkeratotic lesion with a central core present on the plantar aspect of the forefoot bilateral. Pain on palpation with a central nucleated core noted.  Skin is warm, dry and supple bilateral lower extremities. Negative for open lesions or macerations.  Vascular: Palpable pedal pulses bilaterally. No edema or erythema noted. Capillary refill within normal limits.  Neurological: Epicritic and protective threshold grossly intact bilaterally.   Musculoskeletal Exam: Decreased muscle strength noted on foot eversion and peroneal firing.  Pain on palpation at the keratotic lesion noted. Range of motion within normal limits bilateral. Muscle strength 5/5 in all groups bilateral.  Assessment: #1 painful porokeratotic lesions bilateral forefeet #2 dropfoot deformity right lower extremity next line #3 pain in bilateral feet   Plan of Care:  #1 Patient evaluated #2 Excisional debridement of  keratoic lesion using a chisel blade was performed without incident.  #3 Treated area(s) with Salinocaine and dressed with light dressing. #4 prescription for an AFO to the right lower extremity was dispensed with an appointment for [facility]. #5 consult referral for neurology to evaluate dropfoot deformity #6 and notices given the patient to return to work. #7 patient is to return to the clinic when necessary  Edrick Kins, [place]

## 2016-03-28 ENCOUNTER — Other Ambulatory Visit: Payer: Self-pay | Admitting: Cardiology

## 2016-03-29 ENCOUNTER — Telehealth: Payer: Self-pay | Admitting: Cardiology

## 2016-03-29 ENCOUNTER — Ambulatory Visit (INDEPENDENT_AMBULATORY_CARE_PROVIDER_SITE_OTHER): Payer: Medicare Other | Admitting: *Deleted

## 2016-03-29 DIAGNOSIS — Z95810 Presence of automatic (implantable) cardiac defibrillator: Secondary | ICD-10-CM | POA: Diagnosis not present

## 2016-03-29 DIAGNOSIS — I255 Ischemic cardiomyopathy: Secondary | ICD-10-CM

## 2016-03-29 NOTE — Telephone Encounter (Signed)
LMOVM reminding pt to send remote transmission.   

## 2016-03-30 NOTE — Progress Notes (Signed)
Remote ICD transmission.   

## 2016-04-04 ENCOUNTER — Other Ambulatory Visit: Payer: Medicare Other

## 2016-04-06 ENCOUNTER — Encounter: Payer: Self-pay | Admitting: Cardiology

## 2016-04-28 LAB — CUP PACEART REMOTE DEVICE CHECK
Battery Remaining Longevity: 76 mo
Battery Voltage: 2.99 V
Brady Statistic RV Percent Paced: 1 %
Date Time Interrogation Session: 20171101123027
HIGH POWER IMPEDANCE MEASURED VALUE: 65 Ohm
HighPow Impedance: 65 Ohm
Implantable Lead Location: 753860
Implantable Lead Model: 181
Implantable Lead Serial Number: 325827
Lead Channel Pacing Threshold Amplitude: 0.75 V
Lead Channel Setting Sensing Sensitivity: 0.5 mV
MDC IDC LEAD IMPLANT DT: 20140929
MDC IDC MSMT BATTERY REMAINING PERCENTAGE: 75 %
MDC IDC MSMT LEADCHNL RV IMPEDANCE VALUE: 340 Ohm
MDC IDC MSMT LEADCHNL RV PACING THRESHOLD PULSEWIDTH: 0.5 ms
MDC IDC MSMT LEADCHNL RV SENSING INTR AMPL: 11.3 mV
MDC IDC PG IMPLANT DT: 20140929
MDC IDC PG SERIAL: 7132440
MDC IDC SET LEADCHNL RV PACING AMPLITUDE: 2.5 V
MDC IDC SET LEADCHNL RV PACING PULSEWIDTH: 0.5 ms

## 2016-05-02 ENCOUNTER — Other Ambulatory Visit: Payer: Medicare Other

## 2016-05-02 ENCOUNTER — Other Ambulatory Visit: Payer: Self-pay | Admitting: Cardiology

## 2016-05-16 ENCOUNTER — Ambulatory Visit (INDEPENDENT_AMBULATORY_CARE_PROVIDER_SITE_OTHER): Payer: Medicare Other | Admitting: Adult Health

## 2016-05-16 ENCOUNTER — Encounter: Payer: Self-pay | Admitting: Adult Health

## 2016-05-16 ENCOUNTER — Other Ambulatory Visit: Payer: Self-pay | Admitting: Cardiology

## 2016-05-16 DIAGNOSIS — J431 Panlobular emphysema: Secondary | ICD-10-CM

## 2016-05-16 DIAGNOSIS — C3490 Malignant neoplasm of unspecified part of unspecified bronchus or lung: Secondary | ICD-10-CM

## 2016-05-16 DIAGNOSIS — I255 Ischemic cardiomyopathy: Secondary | ICD-10-CM | POA: Diagnosis not present
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
## 2016-05-16 NOTE — Patient Instructions (Signed)
Continue Spiriva and Advair .  Continue to work on not smoking . Follow up with CT chest next month and Dr. Sondra Come.  Follow up Dr. Chase Caller in 6 months and As needed   Please contact office for sooner follow up if symptoms do not improve or worsen or seek emergency care

## 2016-05-17 ENCOUNTER — Ambulatory Visit: Payer: Medicare Other | Admitting: Adult Health

## 2016-05-18 DIAGNOSIS — Z0000 Encounter for general adult medical examination without abnormal findings: Secondary | ICD-10-CM | POA: Diagnosis not present

## 2016-05-20 NOTE — Assessment & Plan Note (Signed)
Compensated without flare   Plan  Patient Instructions  Continue Spiriva and Advair .  Continue to work on not smoking . Follow up with CT chest next month and Dr. Sondra Come.  Follow up Dr. Chase Caller in 6 months and As needed   Please contact office for sooner follow up if symptoms do not improve or worsen or seek emergency care

## 2016-05-20 NOTE — Assessment & Plan Note (Signed)
S/p resection and XRT  Cont follow up , planned CT chest next moth

## 2016-05-24 ENCOUNTER — Telehealth: Payer: Self-pay | Admitting: *Deleted

## 2016-05-24 NOTE — Telephone Encounter (Signed)
XXXX 

## 2016-05-24 NOTE — Telephone Encounter (Signed)
CALLED PATIENT TO INFORM OF CT ON 06-08-16 @ [facility] AND HIS FU WITH DR. KINARD ON 06-09-16, LVM FOR A RETURN CALL

## 2016-05-31 ENCOUNTER — Other Ambulatory Visit: Payer: Self-pay | Admitting: Cardiology

## 2016-06-08 ENCOUNTER — Ambulatory Visit (HOSPITAL_COMMUNITY)
Admission: RE | Admit: 2016-06-08 | Discharge: 2016-06-08 | Disposition: A | Discharge status: Home / Self Care | Payer: Medicare Other | Source: Ambulatory Visit | Attending: Radiation Oncology | Admitting: Radiation Oncology

## 2016-06-08 DIAGNOSIS — J479 Bronchiectasis, uncomplicated: Secondary | ICD-10-CM | POA: Insufficient documentation

## 2016-06-08 DIAGNOSIS — C3491 Malignant neoplasm of unspecified part of right bronchus or lung: Secondary | ICD-10-CM | POA: Diagnosis not present

## 2016-06-08 DIAGNOSIS — C3492 Malignant neoplasm of unspecified part of left bronchus or lung: Secondary | ICD-10-CM | POA: Diagnosis not present

## 2016-06-09 ENCOUNTER — Ambulatory Visit: Admission: RE | Admit: 2016-06-09 | Payer: Medicare Other | Source: Ambulatory Visit | Admitting: Radiation Oncology

## 2016-06-09 ENCOUNTER — Telehealth: Payer: Self-pay | Admitting: Oncology

## 2016-06-09 NOTE — Telephone Encounter (Signed)
Left a message regarding [name]'s follow up appointment with Dr. Sondra Come today.  Requested a return call.

## 2016-06-10 ENCOUNTER — Other Ambulatory Visit: Payer: Self-pay | Admitting: Internal Medicine

## 2016-06-16 ENCOUNTER — Ambulatory Visit: Payer: Medicare Other | Admitting: Radiation Oncology

## 2016-06-27 ENCOUNTER — Encounter: Payer: Self-pay | Admitting: *Deleted

## 2016-06-27 ENCOUNTER — Encounter: Payer: Self-pay | Admitting: Radiation Oncology

## 2016-06-27 ENCOUNTER — Ambulatory Visit
Admission: RE | Admit: 2016-06-27 | Discharge: 2016-06-27 | Disposition: A | Discharge status: Home / Self Care | Payer: Medicare Other | Source: Ambulatory Visit | Attending: Radiation Oncology | Admitting: Radiation Oncology

## 2016-06-27 VITALS — BP 115/71 | HR 77 | Temp 98.0°F | Resp 18 | Ht 68.0 in | Wt 127.0 lb

## 2016-06-27 DIAGNOSIS — C3431 Malignant neoplasm of lower lobe, right bronchus or lung: Secondary | ICD-10-CM | POA: Diagnosis not present

## 2016-06-27 DIAGNOSIS — R918 Other nonspecific abnormal finding of lung field: Secondary | ICD-10-CM | POA: Diagnosis not present

## 2016-06-27 DIAGNOSIS — Z85118 Personal history of other malignant neoplasm of bronchus and lung: Secondary | ICD-10-CM | POA: Diagnosis not present

## 2016-06-27 DIAGNOSIS — Z923 Personal history of irradiation: Secondary | ICD-10-CM | POA: Diagnosis not present

## 2016-06-27 DIAGNOSIS — C3491 Malignant neoplasm of unspecified part of right bronchus or lung: Secondary | ICD-10-CM

## 2016-06-27 NOTE — Progress Notes (Signed)
Radiation Oncology         [phone] ________________________________  Name: [name] MRN: [id number]  Date: 06/27/2016  DOB: 30-Jun-1960  Follow-Up Visit Note  CC: Elyn Peers, MD  Melrose Nakayama, *    ICD-9-CM ICD-10-CM   1. Malignant neoplasm of lower lobe of right lung (HCC) 162.5 C34.31   2. Adenocarcinoma of right lung, stage 1 (HCC) 162.9 C34.91     Diagnosis:   Recurrent stage I non-small cell lung cancer of the right lower lobe    Interval Since Last Radiation:  5 months SBRT Treatment from 01/14/16-01/20/16  Narrative:  The patient returns today for routine follow-up after SBRT treatment to his right lower lobe. He denies pain other than stiffness left over from surgery in his right side. He reports having a dry cough and shortness of breath. He notes this has not worsened. He reports having a good appetite and energy level. No hemoptysis. No bony pain no headaches dizziness or blurred vision                         ALLERGIES:  has No Known Allergies.  Meds: Current Outpatient Prescriptions  Medication Sig Dispense Refill  . ASPIRIN LOW DOSE 81 MG EC tablet TAKE 1 TABLET BY MOUTH EVERY DAY 30 tablet 2  . carvedilol (COREG) 3.125 MG tablet Take 1 tablet (3.125 mg total) by mouth 2 (two) times daily with a meal. 60 tablet 11  . Fluticasone-Salmeterol (ADVAIR DISKUS) 100-50 MCG/DOSE AEPB INHALE CONTENTS OF 1 BLISTER USING DISKUS TWO TIMES DAILY 180 each 5  . simvastatin (ZOCOR) 40 MG tablet TAKE ONE TABLET BY MOUTH AT BEDTIME - NEED TO SCHEDULE APPOINTMENT FOR REFILLS 30 tablet 0  . SPIRIVA HANDIHALER 18 MCG inhalation capsule PLACE 1 CAPSULE INTO HANDIHALER AND INHALE DAILY 90 capsule 0  . ZETIA 10 MG tablet TAKE ONE TABLET BY MOUTH EVERY DAY .PLEASE CONTACT OFFICE FORADDITIONAL REFILLS 30 tablet 11   No current facility-administered medications for this encounter.     Physical Findings: The patient is in no acute distress. Patient is alert and  oriented.  height is '5\' 8"'$  (1.727 m) and weight is 127 lb (57.6 kg). His oral temperature is 98 F (36.7 C). His blood pressure is 115/71 and his pulse is 77. His respiration is 18 and oxygen saturation is 99%. .  No significant changes. Lungs are clear to auscultation bilaterally. Heart has regular rate and rhythm. No palpable cervical, supraclavicular, or axillary adenopathy. Abdomen soft, non-tender, normal bowel sounds.   Lab Findings: Lab Results  Component Value Date   WBC 7.3 11/02/2015   HGB 14.9 11/02/2015   HCT 43.4 11/02/2015   MCV 83.3 11/02/2015   PLT 327 11/02/2015    Radiographic Findings: Ct Chest Wo Contrast  Result Date: 06/08/2016 CLINICAL DATA:  Restaging left lung cancer. Initial diagnosis 2015. Followup right lung nodule, status post SBRT. EXAM: CT CHEST WITHOUT CONTRAST TECHNIQUE: Multidetector CT imaging of the chest was performed following the standard protocol without IV contrast. COMPARISON:  Chest CT 10/20/2015 and PET-CT 10/23/2015. FINDINGS: Chest wall: Stable permanent pacemaker on the left side. No chest wall mass, supraclavicular or axillary lymphadenopathy. Small scattered lymph nodes appears stable. Cardiovascular: The heart is mildly enlarged but stable. No pericardial effusion. The aorta is normal in caliber. Stable atherosclerotic calcifications. Mediastinum/Nodes: Persistent numerous enlarged prevascular space lymph nodes adjacent to the aorta and main pulmonary artery. Index node measures 17 mm on image  number 50 and previously measured 17 mm. Second prevascular node on image number 57 measures 11 mm and previously measured 14 mm. The esophagus is grossly normal. Lungs/Pleura: There were 2 new small adjacent pulmonary nodules in the right lower lobe adjacent to a suture line. These were hypermetabolic on the PET scan and subsequently underwent radiation therapy. No measurable persistent disease is identified. However, there are numerous new pulmonary nodules  consistent with metastatic disease. There is also a new 4 mm lesion in the left lower lobe on image number 112. Severe chronic radiation changes involving the left lung. Associated traction bronchiectasis. Dense left upper lobe airspace consolidation. Stable dense scarring changes in the right upper lobe with bronchiectasis. Upper Abdomen: No significant upper abdominal findings. Musculoskeletal: No significant osseous findings. IMPRESSION: 1. Status post SBRT to the right lower lobe pulmonary nodules with no persistent/residual measurable disease. 2. Several new small bilateral pulmonary nodules consistent with metastasis. 3. Stable enlarged mediastinal lymph nodes. 4. Stable severe radiation changes involving left lung and associated bronchiectasis. Electronically Signed   By: Marijo Sanes M.D.   On: 06/08/2016 14:57    Impression:  Unfortunately recent chest CT scan is consistent with recurrence of his lung cancer with several new small bilateral pulmonary nodules. Area of treatment in the right lower lobe area has resolved.  Plan:  Patient will be scheduled for consultation with Dr. [name] to see if he would be a candidate for systemic therapy.  ____________________________________  This document serves as a record of services personally performed by Gery Pray, MD. It was created on his behalf by Bethann Humble, a trained medical scribe. The creation of this record is based on the scribe's personal observations and the provider's statements to them. This document has been checked and approved by the attending provider.

## 2016-06-27 NOTE — Progress Notes (Signed)
  Oncology Nurse Navigator Documentation  Navigator Location: [facility] (06/27/16 1600)   )Navigator Encounter Type: Follow-up Appt (06/27/16 1600)  Patient at [facility] for follow up appointment with Dr. Sondra Come.  He reports that he has been feeling and doing well.  He denied any medical needs.  He continues to struggle with smoking cessation.  He has tried patches with little help.  I told him that we had classes available and he declined any information.  He reports that he has decreased the number of cigarettes he smokes but has been unable to stop.  We discussed reducing the number for a couple weeks then more the next 2 weeks until the number decreases to a small number and he can set a date to quit.  We discussed how setting a quit date is often helpful.  I encouraged him to call me for any other needs or concerns.                   Patient Visit Type: RadOnc (06/27/16 1600) Treatment Phase: Follow-up (06/27/16 1600) Barriers/Navigation Needs: No barriers at this time (Continued difficult with quitting smoking) (06/27/16 1600)   Interventions: Education (06/27/16 1600)     Education Method: Verbal (06/27/16 1600)  Support Groups/Services: Other (Patient declined offer of information about classes) (06/27/16 1600)             Time Spent with Patient: 15 (06/27/16 1600)

## 2016-06-27 NOTE — Progress Notes (Signed)
[name] 56 y.o. Man with Stage I adenocarcinoma of the right lung radiation completed 01-20-16 one month FU. Denies problems with swallowing or wheezing, nausea or vomiting.  Reports a dry cough.  Appetite normal eating two meals a day. Having fatigue at different times of the day. Wt Readings from Last 3 Encounters:  05/16/16 128 lb 12.8 oz (58.4 kg)  03/03/16 127 lb (57.6 kg)  02/18/16 123 lb 12.8 oz (56.2 kg)  BP 115/71   Pulse 77   Temp 98 F (36.7 C) (Oral)   Resp 18   Ht '5\' 8"'$  (1.727 m)   Wt 127 lb (57.6 kg)   SpO2 99%   BMI 19.31 kg/m

## 2016-06-29 ENCOUNTER — Telehealth: Payer: Self-pay | Admitting: *Deleted

## 2016-06-29 ENCOUNTER — Encounter: Payer: Self-pay | Admitting: Internal Medicine

## 2016-06-29 ENCOUNTER — Telehealth: Payer: Self-pay | Admitting: Internal Medicine

## 2016-06-29 NOTE — Telephone Encounter (Signed)
Let a vm to let the pt know an appt has been scheduled for him to see Dr. [name] on 2/12 at 215pm and to arrive 30 minutes early. Mailed letter with the appt date and time.

## 2016-06-29 NOTE — Telephone Encounter (Signed)
Called patient to inform of [place] appt. With Dr. [name] on 07-11-16 @ 2:15 pm, spoke with patient and he is aware of this appt.

## 2016-07-08 ENCOUNTER — Other Ambulatory Visit: Payer: Self-pay | Admitting: Internal Medicine

## 2016-07-11 ENCOUNTER — Encounter: Payer: Self-pay | Admitting: *Deleted

## 2016-07-11 ENCOUNTER — Ambulatory Visit (HOSPITAL_BASED_OUTPATIENT_CLINIC_OR_DEPARTMENT_OTHER): Payer: Medicare Other | Admitting: Internal Medicine

## 2016-07-11 ENCOUNTER — Encounter: Payer: Self-pay | Admitting: Internal Medicine

## 2016-07-11 ENCOUNTER — Telehealth: Payer: Self-pay | Admitting: Internal Medicine

## 2016-07-11 DIAGNOSIS — C3431 Malignant neoplasm of lower lobe, right bronchus or lung: Secondary | ICD-10-CM

## 2016-07-11 DIAGNOSIS — R918 Other nonspecific abnormal finding of lung field: Secondary | ICD-10-CM

## 2016-07-11 DIAGNOSIS — F17200 Nicotine dependence, unspecified, uncomplicated: Secondary | ICD-10-CM

## 2016-07-11 DIAGNOSIS — I2510 Atherosclerotic heart disease of native coronary artery without angina pectoris: Secondary | ICD-10-CM

## 2016-07-11 DIAGNOSIS — Z7982 Long term (current) use of aspirin: Secondary | ICD-10-CM | POA: Diagnosis not present

## 2016-07-11 DIAGNOSIS — J449 Chronic obstructive pulmonary disease, unspecified: Secondary | ICD-10-CM | POA: Diagnosis not present

## 2016-07-11 DIAGNOSIS — J439 Emphysema, unspecified: Secondary | ICD-10-CM

## 2016-07-11 NOTE — Telephone Encounter (Signed)
Appointments scheduled per 2/12 LOS. Patient given AVS report and calendars with future scheduled appointments.

## 2016-07-11 NOTE — Patient Instructions (Signed)
Steps to Quit Smoking Smoking tobacco can be bad for your health. It can also affect almost every organ in your body. Smoking puts you and people around you at risk for many serious long-lasting (chronic) diseases. Quitting smoking is hard, but it is one of the best things that you can do for your health. It is never too late to quit. What are the benefits of quitting smoking? When you quit smoking, you lower your risk for getting serious diseases and conditions. They can include:  Lung cancer or lung disease.  Heart disease.  Stroke.  Heart attack.  Not being able to have children (infertility).  Weak bones (osteoporosis) and broken bones (fractures). If you have coughing, wheezing, and shortness of breath, those symptoms may get better when you quit. You may also get sick less often. If you are pregnant, quitting smoking can help to lower your chances of having a baby of low birth weight. What can I do to help me quit smoking? Talk with your doctor about what can help you quit smoking. Some things you can do (strategies) include:  Quitting smoking totally, instead of slowly cutting back how much you smoke over a period of time.  Going to in-person counseling. You are more likely to quit if you go to many counseling sessions.  Using resources and support systems, such as:  Online chats with a counselor.  Phone quitlines.  Printed self-help materials.  Support groups or group counseling.  Text messaging programs.  Mobile phone apps or applications.  Taking medicines. Some of these medicines may have nicotine in them. If you are pregnant or breastfeeding, do not take any medicines to quit smoking unless your doctor says it is okay. Talk with your doctor about counseling or other things that can help you. Talk with your doctor about using more than one strategy at the same time, such as taking medicines while you are also going to in-person counseling. This can help make quitting  easier. What things can I do to make it easier to quit? Quitting smoking might feel very hard at first, but there is a lot that you can do to make it easier. Take these steps:  Talk to your family and friends. Ask them to support and encourage you.  Call phone quitlines, reach out to support groups, or work with a counselor.  Ask people who smoke to not smoke around you.  Avoid places that make you want (trigger) to smoke, such as:  Bars.  Parties.  Smoke-break areas at work.  Spend time with people who do not smoke.  Lower the stress in your life. Stress can make you want to smoke. Try these things to help your stress:  Getting regular exercise.  Deep-breathing exercises.  Yoga.  Meditating.  Doing a body scan. To do this, close your eyes, focus on one area of your body at a time from head to toe, and notice which parts of your body are tense. Try to relax the muscles in those areas.  Download or buy apps on your mobile phone or tablet that can help you stick to your quit plan. There are many free apps, such as QuitGuide from the CDC (Centers for Disease Control and Prevention). You can find more support from smokefree.gov and other websites. This information is not intended to replace advice given to you by your health care provider. Make sure you discuss any questions you have with your health care provider. Document Released: 03/12/2009 Document Revised: 01/12/2016 Document   Reviewed: 09/30/2014 Elsevier Interactive Patient Education  2017 [employer]  
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
## 2016-07-11 NOTE — Progress Notes (Signed)
Oncology Nurse Navigator Documentation  Oncology Nurse Navigator Flowsheets 07/11/2016  Navigator Location [facility]  Navigator Encounter Type Clinic/MDC/I spoke with Mr. Abbey today at [facility].  According to Dr. [name] patient looks to have disease progression.  Dr. [name] would like tissue obtained on 11/02/15 to be sent to [facility] and PDL1 testing. I called [facility] and requested this to be sent.  I also spoke to patient about smoking cessation. I listened as he explained.  I encouraged him to quit and stated if he needs help to contact me.   Patient Visit Type MedOnc  Treatment Phase Abnormal Scans  Barriers/Navigation Needs Coordination of Care;Education  Education Other;Smoking cessation  Interventions Coordination of Care;Education  Coordination of Care Other  Education Method Verbal  Acuity Level 2  Acuity Level 2 Educational needs;Other  Time Spent with Patient 30

## 2016-07-12 ENCOUNTER — Other Ambulatory Visit (HOSPITAL_COMMUNITY)
Admission: RE | Admit: 2016-07-12 | Discharge: 2016-07-12 | Disposition: A | Discharge status: Home / Self Care | Payer: Medicare Other | Source: Ambulatory Visit | Attending: Internal Medicine | Admitting: Internal Medicine

## 2016-07-12 DIAGNOSIS — C3490 Malignant neoplasm of unspecified part of unspecified bronchus or lung: Secondary | ICD-10-CM | POA: Diagnosis not present

## 2016-07-13 ENCOUNTER — Telehealth: Payer: Self-pay | Admitting: *Deleted

## 2016-07-13 NOTE — Telephone Encounter (Signed)
Oncology Nurse Navigator Documentation  Oncology Nurse Navigator Flowsheets 07/13/2016  Navigator Location [facility]  Navigator Encounter Type Telephone/I called central scheduling to obtain at date and time for PET and MRI brain.  I then called patient.  I spoke with him and gave him the appt time and place.  I also gave him pre-procedure instructions nothing to eat or drink after 6:15 am.  He verbalized understanding of appt   Telephone Outgoing Call  Treatment Phase Abnormal Scans  Barriers/Navigation Needs Coordination of Care  Interventions Coordination of Care  Coordination of Care Appts;Other;Radiology  Acuity Level 2  Time Spent with Patient 30

## 2016-07-14 ENCOUNTER — Other Ambulatory Visit: Payer: Self-pay | Admitting: Internal Medicine

## 2016-07-14 MED ORDER — CARVEDILOL 3.125 MG PO TABS: 3.1250 mg | ORAL_TABLET | Freq: Two times a day (BID) | ORAL | 0 refills | Status: DC

## 2016-07-18 ENCOUNTER — Encounter (HOSPITAL_COMMUNITY): Payer: Self-pay

## 2016-07-19 ENCOUNTER — Telehealth: Payer: Self-pay | Admitting: Internal Medicine

## 2016-07-19 NOTE — Telephone Encounter (Signed)
Prior authorization for Spiriva handihaler initiated with [employer]. Pts key is: [id number]. Will route to [place] to follow.

## 2016-07-20 ENCOUNTER — Ambulatory Visit (HOSPITAL_COMMUNITY): Admission: RE | Admit: 2016-07-20 | Payer: Medicare Other | Source: Ambulatory Visit

## 2016-07-20 ENCOUNTER — Ambulatory Visit (HOSPITAL_COMMUNITY)
Admission: RE | Admit: 2016-07-20 | Discharge: 2016-07-20 | Disposition: A | Discharge status: Home / Self Care | Payer: Medicare Other | Source: Ambulatory Visit | Attending: Internal Medicine | Admitting: Internal Medicine

## 2016-07-20 DIAGNOSIS — R918 Other nonspecific abnormal finding of lung field: Secondary | ICD-10-CM

## 2016-07-20 NOTE — Telephone Encounter (Signed)
Received fax stating Spiriva has been approved from 12.30.17 - 12.31.2018. Fax has been sent to scan. LMTCB for pt.

## 2016-07-21 ENCOUNTER — Telehealth: Payer: Self-pay | Admitting: *Deleted

## 2016-07-21 ENCOUNTER — Encounter: Payer: Self-pay | Admitting: *Deleted

## 2016-07-21 DIAGNOSIS — C3431 Malignant neoplasm of lower lobe, right bronchus or lung: Secondary | ICD-10-CM

## 2016-07-21 NOTE — Telephone Encounter (Signed)
LMTCB for the pt 

## 2016-07-21 NOTE — Telephone Encounter (Signed)
Oncology Nurse Navigator Documentation  Oncology Nurse Navigator Flowsheets 07/21/2016  Navigator Location [facility]  Navigator Encounter Type Telephone/I called [name] today to update him on MRI being cancelled and CT head to be done.  I was unable to reach him and left vm message for him to call me with my name and phone number.   Treatment Phase Abnormal Scans  Barriers/Navigation Needs Coordination of Care  Interventions Coordination of Care  Coordination of Care Other  Time Spent with Patient 30

## 2016-07-22 ENCOUNTER — Ambulatory Visit (HOSPITAL_COMMUNITY)
Admission: RE | Admit: 2016-07-22 | Discharge: 2016-07-22 | Disposition: A | Discharge status: Home / Self Care | Payer: Medicare Other | Source: Ambulatory Visit | Attending: Internal Medicine | Admitting: Internal Medicine

## 2016-07-22 DIAGNOSIS — R918 Other nonspecific abnormal finding of lung field: Secondary | ICD-10-CM | POA: Diagnosis not present

## 2016-07-22 DIAGNOSIS — C3492 Malignant neoplasm of unspecified part of left bronchus or lung: Secondary | ICD-10-CM | POA: Diagnosis not present

## 2016-07-22 LAB — GLUCOSE, CAPILLARY: GLUCOSE-CAPILLARY: 79 mg/dL (ref 65–99)

## 2016-07-22 MED ORDER — FLUDEOXYGLUCOSE F - 18 (FDG) INJECTION
6.6000 | Freq: Once | INTRAVENOUS | Status: AC | PRN
  Administered 2016-07-22: 6.6 via INTRAVENOUS

## 2016-07-22 NOTE — Telephone Encounter (Signed)
lmtcb for pt.  

## 2016-07-25 NOTE — Telephone Encounter (Signed)
Will sign off on message per protocol due to several unsuccessful attempts to reach pt.

## 2016-07-26 ENCOUNTER — Encounter: Payer: Self-pay | Admitting: *Deleted

## 2016-07-27 ENCOUNTER — Encounter (HOSPITAL_COMMUNITY): Payer: Self-pay

## 2016-07-28 ENCOUNTER — Telehealth: Payer: Self-pay | Admitting: Internal Medicine

## 2016-07-28 NOTE — Telephone Encounter (Signed)
Garrett Salina, RN    Received fax stating [employer] has been approved from 12.30.17 - 12.31.2018. Fax has been sent to scan. LMTCB for pt.     ---------------------------------------------------- Spoke with pt. He was already aware that Spiriva had been approved. Nothing further was needed.

## 2016-08-02 ENCOUNTER — Encounter (HOSPITAL_COMMUNITY): Payer: Self-pay

## 2016-08-02 ENCOUNTER — Other Ambulatory Visit: Payer: Self-pay | Admitting: Internal Medicine

## 2016-08-04 ENCOUNTER — Encounter (HOSPITAL_COMMUNITY): Payer: Self-pay

## 2016-08-04 ENCOUNTER — Ambulatory Visit (HOSPITAL_COMMUNITY)
Admission: RE | Admit: 2016-08-04 | Discharge: 2016-08-04 | Disposition: A | Discharge status: Home / Self Care | Payer: Medicare Other | Source: Ambulatory Visit | Attending: Internal Medicine | Admitting: Internal Medicine

## 2016-08-04 DIAGNOSIS — C3431 Malignant neoplasm of lower lobe, right bronchus or lung: Secondary | ICD-10-CM | POA: Diagnosis not present

## 2016-08-04 DIAGNOSIS — C3490 Malignant neoplasm of unspecified part of unspecified bronchus or lung: Secondary | ICD-10-CM | POA: Diagnosis not present

## 2016-08-04 MED ORDER — IOPAMIDOL (ISOVUE-300) INJECTION 61%
75.0000 mL | Freq: Once | INTRAVENOUS | Status: AC | PRN
  Administered 2016-08-04: 75 mL via INTRAVENOUS

## 2016-08-04 MED ORDER — IOPAMIDOL (ISOVUE-300) INJECTION 61%
INTRAVENOUS | Status: AC
  Filled 2016-08-04: qty 75

## 2016-08-08 ENCOUNTER — Other Ambulatory Visit: Payer: Self-pay | Admitting: *Deleted

## 2016-08-08 DIAGNOSIS — R918 Other nonspecific abnormal finding of lung field: Secondary | ICD-10-CM

## 2016-08-09 ENCOUNTER — Other Ambulatory Visit (HOSPITAL_BASED_OUTPATIENT_CLINIC_OR_DEPARTMENT_OTHER): Payer: Medicare Other

## 2016-08-09 ENCOUNTER — Telehealth: Payer: Self-pay | Admitting: Internal Medicine

## 2016-08-09 ENCOUNTER — Ambulatory Visit (HOSPITAL_BASED_OUTPATIENT_CLINIC_OR_DEPARTMENT_OTHER): Payer: Medicare Other | Admitting: Internal Medicine

## 2016-08-09 ENCOUNTER — Encounter: Payer: Self-pay | Admitting: Internal Medicine

## 2016-08-09 VITALS — BP 121/81 | HR 71 | Temp 98.4°F | Resp 18 | Ht 68.0 in | Wt 126.4 lb

## 2016-08-09 DIAGNOSIS — C3491 Malignant neoplasm of unspecified part of right bronchus or lung: Secondary | ICD-10-CM

## 2016-08-09 DIAGNOSIS — J431 Panlobular emphysema: Secondary | ICD-10-CM

## 2016-08-09 DIAGNOSIS — C3431 Malignant neoplasm of lower lobe, right bronchus or lung: Secondary | ICD-10-CM

## 2016-08-09 DIAGNOSIS — J449 Chronic obstructive pulmonary disease, unspecified: Secondary | ICD-10-CM

## 2016-08-09 DIAGNOSIS — R918 Other nonspecific abnormal finding of lung field: Secondary | ICD-10-CM

## 2016-08-09 DIAGNOSIS — J439 Emphysema, unspecified: Secondary | ICD-10-CM

## 2016-08-09 LAB — COMPREHENSIVE METABOLIC PANEL
ALBUMIN: 3.6 g/dL (ref 3.5–5.0)
ALK PHOS: 86 U/L (ref 40–150)
ALT: 32 U/L (ref 0–55)
ANION GAP: 7 meq/L (ref 3–11)
AST: 29 U/L (ref 5–34)
BILIRUBIN TOTAL: 0.45 mg/dL (ref 0.20–1.20)
BUN: 14.6 mg/dL (ref 7.0–26.0)
CALCIUM: 9.7 mg/dL (ref 8.4–10.4)
CO2: 26 mEq/L (ref 22–29)
Chloride: 106 mEq/L (ref 98–109)
Creatinine: 0.9 mg/dL (ref 0.7–1.3)
Glucose: 102 mg/dl (ref 70–140)
Potassium: 4.6 mEq/L (ref 3.5–5.1)
Sodium: 139 mEq/L (ref 136–145)
TOTAL PROTEIN: 8.9 g/dL — AB (ref 6.4–8.3)

## 2016-08-09 LAB — CBC WITH DIFFERENTIAL/PLATELET
BASO%: 0.7 % (ref 0.0–2.0)
Basophils Absolute: 0 10*3/uL (ref 0.0–0.1)
EOS ABS: 0.3 10*3/uL (ref 0.0–0.5)
EOS%: 4.8 % (ref 0.0–7.0)
HEMATOCRIT: 45.3 % (ref 38.4–49.9)
HEMOGLOBIN: 15.2 g/dL (ref 13.0–17.1)
LYMPH#: 2.6 10*3/uL (ref 0.9–3.3)
LYMPH%: 37.5 % (ref 14.0–49.0)
MCH: 29.2 pg (ref 27.2–33.4)
MCHC: 33.5 g/dL (ref 32.0–36.0)
MCV: 87.1 fL (ref 79.3–98.0)
MONO#: 0.6 10*3/uL (ref 0.1–0.9)
MONO%: 8.8 % (ref 0.0–14.0)
NEUT%: 48.2 % (ref 39.0–75.0)
NEUTROS ABS: 3.4 10*3/uL (ref 1.5–6.5)
PLATELETS: 277 10*3/uL (ref 140–400)
RBC: 5.2 10*6/uL (ref 4.20–5.82)
RDW: 14.7 % — ABNORMAL HIGH (ref 11.0–14.6)
WBC: 7.1 10*3/uL (ref 4.0–10.3)

## 2016-08-09 NOTE — Patient Instructions (Signed)
Steps to Quit Smoking Smoking tobacco can be bad for your health. It can also affect almost every organ in your body. Smoking puts you and people around you at risk for many serious long-lasting (chronic) diseases. Quitting smoking is hard, but it is one of the best things that you can do for your health. It is never too late to quit. What are the benefits of quitting smoking? When you quit smoking, you lower your risk for getting serious diseases and conditions. They can include:  Lung cancer or lung disease.  Heart disease.  Stroke.  Heart attack.  Not being able to have children (infertility).  Weak bones (osteoporosis) and broken bones (fractures). If you have coughing, wheezing, and shortness of breath, those symptoms may get better when you quit. You may also get sick less often. If you are pregnant, quitting smoking can help to lower your chances of having a baby of low birth weight. What can I do to help me quit smoking? Talk with your doctor about what can help you quit smoking. Some things you can do (strategies) include:  Quitting smoking totally, instead of slowly cutting back how much you smoke over a period of time.  Going to in-person counseling. You are more likely to quit if you go to many counseling sessions.  Using resources and support systems, such as:  Online chats with a counselor.  Phone quitlines.  Printed self-help materials.  Support groups or group counseling.  Text messaging programs.  Mobile phone apps or applications.  Taking medicines. Some of these medicines may have nicotine in them. If you are pregnant or breastfeeding, do not take any medicines to quit smoking unless your doctor says it is okay. Talk with your doctor about counseling or other things that can help you. Talk with your doctor about using more than one strategy at the same time, such as taking medicines while you are also going to in-person counseling. This can help make quitting  easier. What things can I do to make it easier to quit? Quitting smoking might feel very hard at first, but there is a lot that you can do to make it easier. Take these steps:  Talk to your family and friends. Ask them to support and encourage you.  Call phone quitlines, reach out to support groups, or work with a counselor.  Ask people who smoke to not smoke around you.  Avoid places that make you want (trigger) to smoke, such as:  Bars.  Parties.  Smoke-break areas at work.  Spend time with people who do not smoke.  Lower the stress in your life. Stress can make you want to smoke. Try these things to help your stress:  Getting regular exercise.  Deep-breathing exercises.  Yoga.  Meditating.  Doing a body scan. To do this, close your eyes, focus on one area of your body at a time from head to toe, and notice which parts of your body are tense. Try to relax the muscles in those areas.  Download or buy apps on your mobile phone or tablet that can help you stick to your quit plan. There are many free apps, such as QuitGuide from the CDC (Centers for Disease Control and Prevention). You can find more support from smokefree.gov and other websites. This information is not intended to replace advice given to you by your health care provider. Make sure you discuss any questions you have with your health care provider. Document Released: 03/12/2009 Document Revised: 01/12/2016 Document   Reviewed: 09/30/2014 Elsevier Interactive Patient Education  2017 [employer]  

## 2016-08-09 NOTE — Telephone Encounter (Signed)
Appointments scheduled per 3/13 LOS. Patient given AVS report and calendars with future scheduled appointments.
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
## 2016-08-17 ENCOUNTER — Ambulatory Visit: Payer: Medicare Other | Admitting: Adult Health

## 2016-08-19 ENCOUNTER — Telehealth: Payer: Self-pay | Admitting: Internal Medicine

## 2016-08-19 NOTE — Telephone Encounter (Signed)
Called and lmomtcb x 1 to call back about pt.

## 2016-08-22 NOTE — Telephone Encounter (Signed)
[place] with [facility] is returning call. She called several times. She requested to leave a detailed message regarding order.

## 2016-08-22 NOTE — Telephone Encounter (Signed)
Spoke with Charise Carwin, she has re-faxed the order for MR to sign for this pt's oxygen. The forms were received and placed in Elise's look at. The correct fax number is highlighted on form.   Message forwarded to [facility]

## 2016-08-22 NOTE — Telephone Encounter (Signed)
lmomtcb x 2  

## 2016-08-29 NOTE — Telephone Encounter (Signed)
[name] has CMN and is awaiting MR to return to clinic to sign forms

## 2016-08-30 ENCOUNTER — Other Ambulatory Visit: Payer: Self-pay | Admitting: Cardiology

## 2016-08-30 ENCOUNTER — Other Ambulatory Visit: Payer: Self-pay | Admitting: Internal Medicine

## 2016-09-05 NOTE — Telephone Encounter (Signed)
Rodena Piety please advise on the CMN and if we can close this message.  thanks

## 2016-09-05 NOTE — Telephone Encounter (Signed)
[name] knows that I have this CMN but Dr. Chase Caller hasn't been in the office to sign anything

## 2016-09-12 NOTE — Telephone Encounter (Signed)
This has been signed and given to [place].

## 2016-09-12 NOTE — Telephone Encounter (Signed)
This order has been signed by Dr. Chase Caller today 09/12/2016 and has been faxed to [facility] with confirmation on 09/12/2016 @ 3:23pm

## 2016-09-20 ENCOUNTER — Ambulatory Visit (INDEPENDENT_AMBULATORY_CARE_PROVIDER_SITE_OTHER): Payer: Medicare Other | Admitting: *Deleted

## 2016-09-20 ENCOUNTER — Telehealth: Payer: Self-pay | Admitting: Cardiology

## 2016-09-20 DIAGNOSIS — I255 Ischemic cardiomyopathy: Secondary | ICD-10-CM | POA: Diagnosis not present

## 2016-09-20 NOTE — Telephone Encounter (Signed)
LMOVM reminding pt to send remote transmission.   

## 2016-09-20 NOTE — Progress Notes (Signed)
Remote ICD transmission.   

## 2016-09-21 ENCOUNTER — Encounter: Payer: Self-pay | Admitting: Internal Medicine

## 2016-09-22 LAB — CUP PACEART REMOTE DEVICE CHECK
Battery Remaining Longevity: 72 mo
Battery Voltage: 2.99 V
Date Time Interrogation Session: 20180424180433
HighPow Impedance: 66 Ohm
HighPow Impedance: 66 Ohm
Implantable Lead Location: 753860
Implantable Lead Model: 181
Lead Channel Sensing Intrinsic Amplitude: 11.3 mV
Lead Channel Setting Pacing Amplitude: 2.5 V
MDC IDC LEAD IMPLANT DT: 20140929
MDC IDC LEAD SERIAL: 325827
MDC IDC MSMT BATTERY REMAINING PERCENTAGE: 71 %
MDC IDC MSMT LEADCHNL RV IMPEDANCE VALUE: 360 Ohm
MDC IDC MSMT LEADCHNL RV PACING THRESHOLD AMPLITUDE: 0.75 V
MDC IDC MSMT LEADCHNL RV PACING THRESHOLD PULSEWIDTH: 0.5 ms
MDC IDC PG IMPLANT DT: 20140929
MDC IDC PG SERIAL: 7132440
MDC IDC SET LEADCHNL RV PACING PULSEWIDTH: 0.5 ms
MDC IDC SET LEADCHNL RV SENSING SENSITIVITY: 0.5 mV
MDC IDC STAT BRADY RV PERCENT PACED: 1 %

## 2016-09-23 ENCOUNTER — Encounter: Payer: Self-pay | Admitting: Cardiology

## 2016-10-31 ENCOUNTER — Ambulatory Visit (INDEPENDENT_AMBULATORY_CARE_PROVIDER_SITE_OTHER): Payer: Medicare Other | Admitting: Adult Health

## 2016-10-31 ENCOUNTER — Encounter: Payer: Self-pay | Admitting: Adult Health

## 2016-10-31 DIAGNOSIS — I255 Ischemic cardiomyopathy: Secondary | ICD-10-CM | POA: Diagnosis not present

## 2016-10-31 DIAGNOSIS — C3431 Malignant neoplasm of lower lobe, right bronchus or lung: Secondary | ICD-10-CM

## 2016-10-31 DIAGNOSIS — J431 Panlobular emphysema: Secondary | ICD-10-CM

## 2016-10-31 DIAGNOSIS — F17200 Nicotine dependence, unspecified, uncomplicated: Secondary | ICD-10-CM | POA: Diagnosis not present

## 2016-10-31 NOTE — Assessment & Plan Note (Signed)
Continue with follow up with Dr. [name] next month as planned with serial CT chest

## 2016-10-31 NOTE — Assessment & Plan Note (Signed)
Smoking cessation  

## 2016-10-31 NOTE — Patient Instructions (Signed)
Continue Spiriva and Advair .  Continue to work on not smoking . Follow up with CT chest next month and follow up with Dr. [name] .  Follow up Dr. Chase Caller in 4-6 months and As needed   Please contact office for sooner follow up if symptoms do not improve or worsen or seek emergency care
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
## 2016-10-31 NOTE — Assessment & Plan Note (Signed)
Compensated on current regimen. Smoking cessation was encouraged.  Plan  Patient Instructions  Continue Spiriva and Advair .  Continue to work on not smoking . Follow up with CT chest next month and follow up with Dr. [name] .  Follow up Dr. Chase Caller in 4-6 months and As needed   Please contact office for sooner follow up if symptoms do not improve or worsen or seek emergency care

## 2016-11-01 ENCOUNTER — Encounter: Payer: Self-pay | Admitting: Internal Medicine

## 2016-11-07 ENCOUNTER — Other Ambulatory Visit: Payer: Medicare Other

## 2016-11-07 ENCOUNTER — Encounter (HOSPITAL_COMMUNITY): Payer: Self-pay

## 2016-11-07 ENCOUNTER — Other Ambulatory Visit (HOSPITAL_BASED_OUTPATIENT_CLINIC_OR_DEPARTMENT_OTHER): Payer: Medicare Other

## 2016-11-07 ENCOUNTER — Ambulatory Visit (HOSPITAL_COMMUNITY)
Admission: RE | Admit: 2016-11-07 | Discharge: 2016-11-07 | Disposition: A | Discharge status: Home / Self Care | Payer: Medicare Other | Source: Ambulatory Visit | Attending: Internal Medicine | Admitting: Internal Medicine

## 2016-11-07 DIAGNOSIS — C3491 Malignant neoplasm of unspecified part of right bronchus or lung: Secondary | ICD-10-CM

## 2016-11-07 DIAGNOSIS — R918 Other nonspecific abnormal finding of lung field: Secondary | ICD-10-CM | POA: Insufficient documentation

## 2016-11-07 DIAGNOSIS — I700 Atherosclerosis of aorta: Secondary | ICD-10-CM | POA: Insufficient documentation

## 2016-11-07 LAB — COMPREHENSIVE METABOLIC PANEL
ALT: 11 U/L (ref 0–55)
ANION GAP: 6 meq/L (ref 3–11)
AST: 16 U/L (ref 5–34)
Albumin: 3.3 g/dL — ABNORMAL LOW (ref 3.5–5.0)
Alkaline Phosphatase: 82 U/L (ref 40–150)
BUN: 7.3 mg/dL (ref 7.0–26.0)
CHLORIDE: 107 meq/L (ref 98–109)
CO2: 25 meq/L (ref 22–29)
Calcium: 9.4 mg/dL (ref 8.4–10.4)
Creatinine: 0.8 mg/dL (ref 0.7–1.3)
Glucose: 91 mg/dl (ref 70–140)
POTASSIUM: 4.1 meq/L (ref 3.5–5.1)
Sodium: 139 mEq/L (ref 136–145)
Total Bilirubin: 0.44 mg/dL (ref 0.20–1.20)
Total Protein: 8.5 g/dL — ABNORMAL HIGH (ref 6.4–8.3)

## 2016-11-07 LAB — CBC WITH DIFFERENTIAL/PLATELET
BASO%: 0.6 % (ref 0.0–2.0)
Basophils Absolute: 0 10*3/uL (ref 0.0–0.1)
EOS ABS: 0.3 10*3/uL (ref 0.0–0.5)
EOS%: 5 % (ref 0.0–7.0)
HCT: 44.7 % (ref 38.4–49.9)
HGB: 15 g/dL (ref 13.0–17.1)
LYMPH%: 35.6 % (ref 14.0–49.0)
MCH: 29.6 pg (ref 27.2–33.4)
MCHC: 33.6 g/dL (ref 32.0–36.0)
MCV: 88.1 fL (ref 79.3–98.0)
MONO#: 0.5 10*3/uL (ref 0.1–0.9)
MONO%: 7.6 % (ref 0.0–14.0)
NEUT#: 3.5 10*3/uL (ref 1.5–6.5)
NEUT%: 51.2 % (ref 39.0–75.0)
PLATELETS: 317 10*3/uL (ref 140–400)
RBC: 5.07 10*6/uL (ref 4.20–5.82)
RDW: 16 % — ABNORMAL HIGH (ref 11.0–14.6)
WBC: 6.9 10*3/uL (ref 4.0–10.3)
lymph#: 2.4 10*3/uL (ref 0.9–3.3)

## 2016-11-07 MED ORDER — IOPAMIDOL (ISOVUE-300) INJECTION 61%
INTRAVENOUS | Status: AC
  Filled 2016-11-07: qty 75

## 2016-11-07 MED ORDER — IOPAMIDOL (ISOVUE-300) INJECTION 61%
75.0000 mL | Freq: Once | INTRAVENOUS | Status: AC | PRN
  Administered 2016-11-07: 75 mL via INTRAVENOUS

## 2016-11-10 ENCOUNTER — Telehealth: Payer: Self-pay | Admitting: Internal Medicine

## 2016-11-10 ENCOUNTER — Ambulatory Visit: Payer: Medicare Other | Admitting: Internal Medicine

## 2016-11-10 NOTE — Telephone Encounter (Signed)
Patient called to cancel his appointment he was feeling under the weather and is reschedule for 7/3

## 2016-11-14 ENCOUNTER — Other Ambulatory Visit: Payer: Self-pay | Admitting: Internal Medicine

## 2016-11-29 ENCOUNTER — Encounter: Payer: Self-pay | Admitting: *Deleted

## 2016-11-29 ENCOUNTER — Telehealth: Payer: Self-pay | Admitting: Internal Medicine

## 2016-11-29 ENCOUNTER — Encounter: Payer: Self-pay | Admitting: Internal Medicine

## 2016-11-29 ENCOUNTER — Ambulatory Visit (HOSPITAL_BASED_OUTPATIENT_CLINIC_OR_DEPARTMENT_OTHER): Payer: Medicare Other | Admitting: Internal Medicine

## 2016-11-29 VITALS — BP 129/80 | HR 67 | Temp 98.5°F | Resp 18 | Ht 68.0 in | Wt 123.7 lb

## 2016-11-29 DIAGNOSIS — Z923 Personal history of irradiation: Secondary | ICD-10-CM | POA: Diagnosis not present

## 2016-11-29 DIAGNOSIS — C3431 Malignant neoplasm of lower lobe, right bronchus or lung: Secondary | ICD-10-CM

## 2016-11-29 DIAGNOSIS — Z85118 Personal history of other malignant neoplasm of bronchus and lung: Secondary | ICD-10-CM | POA: Diagnosis not present

## 2016-11-29 DIAGNOSIS — C3491 Malignant neoplasm of unspecified part of right bronchus or lung: Secondary | ICD-10-CM

## 2016-11-29 NOTE — Telephone Encounter (Signed)
Scheduled appt per 7/3 los - Gave patient AVS and calender per los.  

## 2016-11-29 NOTE — Progress Notes (Signed)
  Oncology Nurse Navigator Documentation  Navigator Location: [facility] (11/29/16 1530)   )Navigator Encounter Type: Follow-up Appt (11/29/16 1530)    Patient at [facility] for f/u visit with Dr. [name].  Patient reports that he has been doing well.  He as pleased with his visit and to know that he is stable.  He denied any questions or concerns at this time.  I encouraged him to call for any needs.                 Patient Visit Type: MedOnc (11/29/16 1530) Treatment Phase: Follow-up (11/29/16 1530) Barriers/Navigation Needs: No barriers at this time (11/29/16 1530)   Interventions: None required (11/29/16 1530)                      Time Spent with Patient: 15 (11/29/16 1530)
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
## 2016-12-09 ENCOUNTER — Encounter: Payer: Medicare Other | Admitting: Internal Medicine

## 2016-12-20 ENCOUNTER — Telehealth: Payer: Self-pay | Admitting: Cardiology

## 2016-12-20 ENCOUNTER — Encounter: Payer: Medicare Other | Admitting: *Deleted

## 2016-12-20 NOTE — Telephone Encounter (Signed)
LMOVM reminding pt to send remote transmission.   

## 2016-12-23 ENCOUNTER — Encounter: Payer: Self-pay | Admitting: Cardiology

## 2016-12-27 ENCOUNTER — Other Ambulatory Visit: Payer: Self-pay | Admitting: Cardiology

## 2016-12-29 ENCOUNTER — Telehealth: Payer: Self-pay | Admitting: *Deleted

## 2016-12-29 NOTE — Telephone Encounter (Signed)
Request for refill on zetia. Left message for pt to call to schedule follow up appointment for refills or he is to contact his medical doctor.

## 2017-01-14 IMAGING — CT NM PET TUM IMG INITIAL (PI) SKULL BASE T - THIGH
1 of 8 series · 1 of 25 positions shown · non-contrast
Comparison: PET-CT 06/19/2013, CT chest 10/20/2015

CLINICAL DATA: Subsequent treatment strategy for lung carcinoma..

EXAM:
NUCLEAR MEDICINE PET SKULL BASE TO THIGH
TECHNIQUE: 6.2 mCi F-18 FDG was injected intravenously. Full-ring PET imaging
was performed from the skull base to thigh after the radiotracer. CT
data was obtained and used for attenuation correction and anatomic
localization.
FASTING BLOOD GLUCOSE:  Value: 95 mg/dl

[Series 4: ct sk_thigh 5.0 b31f · axial · 5.0mm · 0.98mm/px · 1 of 214 slices shown]
[im 214/214  brain]
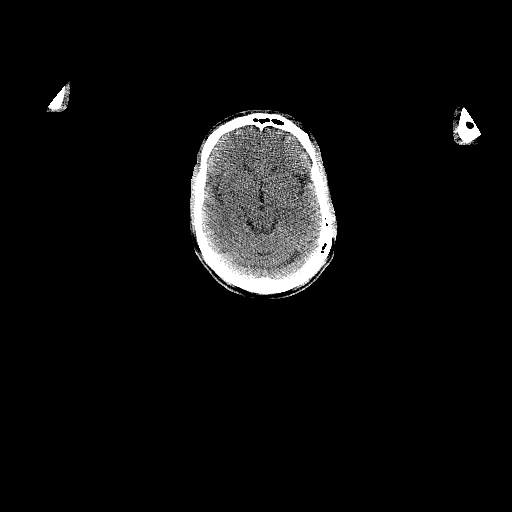

[1 of 25 positions shown; findings below may reference images not displayed]

FINDINGS: NECK

No hypermetabolic lymph nodes in the neck.

CHEST

Enlarging nodule in the medial aspect of the RIGHT middle lobe with
intense metabolic activity for size with SUV max equal 6.9. Nodule
is along the suture line measuring 13 mm (image 88, series 4).

Band of linear interstitial thickening in the RIGHT upper lobe has
low metabolic activity (SUV max 1.5).

There is intense of rim of metabolic activity within the LEFT upper
lobe surrounding a rounded focus of ovoid parenchymal consolidation
measuring 4.0 x 3.4 cm (image 56, series 4). This activity is
intense with SUV max equal 9.0. The central ovoid "Mass" portion
above has progressed in size over more remote comparison CT scans.

There are several hypermetabolic high sub pectoralis lymph nodes on
the LEFT. These nodes are difficult define on the CT portion (fused
imaging on image 41 and 47 close per.

ABDOMEN/PELVIS

No abnormal hypermetabolic activity within the liver, pancreas,
adrenal glands, or spleen. No hypermetabolic lymph nodes in the
abdomen or pelvis.

SKELETON

No focal hypermetabolic activity to suggest skeletal metastasis.
IMPRESSION: 1. Intense hypermetabolic activity associated with the nodule of
concern in medial aspect of the RIGHT lower lobe consistent with
lung cancer recurrence.
2. Rim of intense hypermetabolic activity in the LEFT upper lobe
surrounding an enlarging oval of parenchymal consolidation is
concerning for a chronic infectious process. Consider fungal
infection or mycobacterium.
3. Mild metabolic activity associated with a band of linear
thickening in the RIGHT upper lobe is favored inflammatory.
4. Metabolic activity associated with 2 sub pectoralis lymph nodes
on the LEFT are favored reactive.

## 2017-03-22 ENCOUNTER — Inpatient Hospital Stay (HOSPITAL_COMMUNITY)
Admission: EM | Admit: 2017-03-22 | Discharge: 2017-03-26 | DRG: 291 | Disposition: A | Discharge status: Home / Self Care | Payer: Medicare Other | Attending: Family Medicine | Admitting: Family Medicine

## 2017-03-22 DIAGNOSIS — Z8611 Personal history of tuberculosis: Secondary | ICD-10-CM | POA: Diagnosis present

## 2017-03-22 DIAGNOSIS — Z95810 Presence of automatic (implantable) cardiac defibrillator: Secondary | ICD-10-CM

## 2017-03-22 DIAGNOSIS — J449 Chronic obstructive pulmonary disease, unspecified: Secondary | ICD-10-CM | POA: Diagnosis present

## 2017-03-22 DIAGNOSIS — J189 Pneumonia, unspecified organism: Secondary | ICD-10-CM | POA: Diagnosis present

## 2017-03-22 DIAGNOSIS — R062 Wheezing: Secondary | ICD-10-CM

## 2017-03-22 DIAGNOSIS — I509 Heart failure, unspecified: Secondary | ICD-10-CM | POA: Diagnosis not present

## 2017-03-22 DIAGNOSIS — I5023 Acute on chronic systolic (congestive) heart failure: Principal | ICD-10-CM | POA: Diagnosis present

## 2017-03-22 DIAGNOSIS — C3490 Malignant neoplasm of unspecified part of unspecified bronchus or lung: Secondary | ICD-10-CM | POA: Diagnosis present

## 2017-03-22 DIAGNOSIS — I252 Old myocardial infarction: Secondary | ICD-10-CM

## 2017-03-22 DIAGNOSIS — Z9981 Dependence on supplemental oxygen: Secondary | ICD-10-CM

## 2017-03-22 DIAGNOSIS — Z955 Presence of coronary angioplasty implant and graft: Secondary | ICD-10-CM

## 2017-03-22 DIAGNOSIS — Z8249 Family history of ischemic heart disease and other diseases of the circulatory system: Secondary | ICD-10-CM

## 2017-03-22 DIAGNOSIS — E785 Hyperlipidemia, unspecified: Secondary | ICD-10-CM | POA: Diagnosis present

## 2017-03-22 DIAGNOSIS — F17210 Nicotine dependence, cigarettes, uncomplicated: Secondary | ICD-10-CM | POA: Diagnosis present

## 2017-03-22 DIAGNOSIS — M21371 Foot drop, right foot: Secondary | ICD-10-CM | POA: Diagnosis present

## 2017-03-22 DIAGNOSIS — R0602 Shortness of breath: Secondary | ICD-10-CM | POA: Diagnosis not present

## 2017-03-22 DIAGNOSIS — I255 Ischemic cardiomyopathy: Secondary | ICD-10-CM | POA: Diagnosis present

## 2017-03-22 DIAGNOSIS — J9621 Acute and chronic respiratory failure with hypoxia: Secondary | ICD-10-CM | POA: Diagnosis not present

## 2017-03-22 DIAGNOSIS — Z951 Presence of aortocoronary bypass graft: Secondary | ICD-10-CM

## 2017-03-22 DIAGNOSIS — Z923 Personal history of irradiation: Secondary | ICD-10-CM

## 2017-03-22 DIAGNOSIS — Z85118 Personal history of other malignant neoplasm of bronchus and lung: Secondary | ICD-10-CM

## 2017-03-22 DIAGNOSIS — I472 Ventricular tachycardia: Secondary | ICD-10-CM | POA: Diagnosis not present

## 2017-03-22 DIAGNOSIS — I5043 Acute on chronic combined systolic (congestive) and diastolic (congestive) heart failure: Secondary | ICD-10-CM | POA: Diagnosis present

## 2017-03-22 DIAGNOSIS — Z902 Acquired absence of lung [part of]: Secondary | ICD-10-CM

## 2017-03-22 DIAGNOSIS — Z79899 Other long term (current) drug therapy: Secondary | ICD-10-CM

## 2017-03-22 DIAGNOSIS — R0902 Hypoxemia: Secondary | ICD-10-CM

## 2017-03-22 DIAGNOSIS — I5042 Chronic combined systolic (congestive) and diastolic (congestive) heart failure: Secondary | ICD-10-CM | POA: Diagnosis present

## 2017-03-22 DIAGNOSIS — I2510 Atherosclerotic heart disease of native coronary artery without angina pectoris: Secondary | ICD-10-CM | POA: Diagnosis not present

## 2017-03-22 DIAGNOSIS — Z7982 Long term (current) use of aspirin: Secondary | ICD-10-CM

## 2017-03-22 MED ORDER — ALBUTEROL (5 MG/ML) CONTINUOUS INHALATION SOLN
10.0000 mg/h | INHALATION_SOLUTION | RESPIRATORY_TRACT | Status: DC
  Administered 2017-03-23: 10 mg/h via RESPIRATORY_TRACT
  Filled 2017-03-22: qty 20
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
## 2017-03-23 ENCOUNTER — Emergency Department (HOSPITAL_COMMUNITY): Payer: Medicare Other

## 2017-03-23 ENCOUNTER — Inpatient Hospital Stay (HOSPITAL_COMMUNITY): Payer: Medicare Other

## 2017-03-23 ENCOUNTER — Encounter (HOSPITAL_COMMUNITY): Payer: Self-pay

## 2017-03-23 DIAGNOSIS — Z7982 Long term (current) use of aspirin: Secondary | ICD-10-CM | POA: Diagnosis not present

## 2017-03-23 DIAGNOSIS — M21371 Foot drop, right foot: Secondary | ICD-10-CM | POA: Diagnosis present

## 2017-03-23 DIAGNOSIS — I5021 Acute systolic (congestive) heart failure: Secondary | ICD-10-CM | POA: Diagnosis not present

## 2017-03-23 DIAGNOSIS — J449 Chronic obstructive pulmonary disease, unspecified: Secondary | ICD-10-CM | POA: Diagnosis not present

## 2017-03-23 DIAGNOSIS — F17210 Nicotine dependence, cigarettes, uncomplicated: Secondary | ICD-10-CM | POA: Diagnosis present

## 2017-03-23 DIAGNOSIS — I5042 Chronic combined systolic (congestive) and diastolic (congestive) heart failure: Secondary | ICD-10-CM | POA: Diagnosis present

## 2017-03-23 DIAGNOSIS — I472 Ventricular tachycardia: Secondary | ICD-10-CM | POA: Diagnosis not present

## 2017-03-23 DIAGNOSIS — E785 Hyperlipidemia, unspecified: Secondary | ICD-10-CM | POA: Diagnosis present

## 2017-03-23 DIAGNOSIS — I5043 Acute on chronic combined systolic (congestive) and diastolic (congestive) heart failure: Secondary | ICD-10-CM | POA: Diagnosis present

## 2017-03-23 DIAGNOSIS — Z923 Personal history of irradiation: Secondary | ICD-10-CM | POA: Diagnosis not present

## 2017-03-23 DIAGNOSIS — Z95810 Presence of automatic (implantable) cardiac defibrillator: Secondary | ICD-10-CM | POA: Diagnosis not present

## 2017-03-23 DIAGNOSIS — I255 Ischemic cardiomyopathy: Secondary | ICD-10-CM | POA: Diagnosis present

## 2017-03-23 DIAGNOSIS — I2510 Atherosclerotic heart disease of native coronary artery without angina pectoris: Secondary | ICD-10-CM | POA: Diagnosis present

## 2017-03-23 DIAGNOSIS — I361 Nonrheumatic tricuspid (valve) insufficiency: Secondary | ICD-10-CM | POA: Diagnosis not present

## 2017-03-23 DIAGNOSIS — Z951 Presence of aortocoronary bypass graft: Secondary | ICD-10-CM | POA: Diagnosis not present

## 2017-03-23 DIAGNOSIS — I5023 Acute on chronic systolic (congestive) heart failure: Secondary | ICD-10-CM | POA: Diagnosis not present

## 2017-03-23 DIAGNOSIS — Z8249 Family history of ischemic heart disease and other diseases of the circulatory system: Secondary | ICD-10-CM | POA: Diagnosis not present

## 2017-03-23 DIAGNOSIS — J189 Pneumonia, unspecified organism: Secondary | ICD-10-CM

## 2017-03-23 DIAGNOSIS — Z8611 Personal history of tuberculosis: Secondary | ICD-10-CM | POA: Diagnosis not present

## 2017-03-23 DIAGNOSIS — Z85118 Personal history of other malignant neoplasm of bronchus and lung: Secondary | ICD-10-CM | POA: Diagnosis not present

## 2017-03-23 DIAGNOSIS — Z79899 Other long term (current) drug therapy: Secondary | ICD-10-CM | POA: Diagnosis not present

## 2017-03-23 DIAGNOSIS — J9621 Acute and chronic respiratory failure with hypoxia: Secondary | ICD-10-CM

## 2017-03-23 DIAGNOSIS — Z902 Acquired absence of lung [part of]: Secondary | ICD-10-CM | POA: Diagnosis not present

## 2017-03-23 DIAGNOSIS — I252 Old myocardial infarction: Secondary | ICD-10-CM | POA: Diagnosis not present

## 2017-03-23 DIAGNOSIS — R0602 Shortness of breath: Secondary | ICD-10-CM | POA: Diagnosis not present

## 2017-03-23 DIAGNOSIS — Z955 Presence of coronary angioplasty implant and graft: Secondary | ICD-10-CM | POA: Diagnosis not present

## 2017-03-23 DIAGNOSIS — Z9981 Dependence on supplemental oxygen: Secondary | ICD-10-CM | POA: Diagnosis not present

## 2017-03-23 LAB — CBC WITH DIFFERENTIAL/PLATELET
Basophils Absolute: 0 10*3/uL (ref 0.0–0.1)
Basophils Relative: 0 %
EOS PCT: 3 %
Eosinophils Absolute: 0.3 10*3/uL (ref 0.0–0.7)
HCT: 35.3 % — ABNORMAL LOW (ref 39.0–52.0)
Hemoglobin: 11.9 g/dL — ABNORMAL LOW (ref 13.0–17.0)
LYMPHS ABS: 5.3 10*3/uL — AB (ref 0.7–4.0)
LYMPHS PCT: 49 %
MCH: 29 pg (ref 26.0–34.0)
MCHC: 33.7 g/dL (ref 30.0–36.0)
MCV: 86.1 fL (ref 78.0–100.0)
MONO ABS: 0.5 10*3/uL (ref 0.1–1.0)
MONOS PCT: 5 %
Neutro Abs: 4.6 10*3/uL (ref 1.7–7.7)
Neutrophils Relative %: 43 %
PLATELETS: 297 10*3/uL (ref 150–400)
RBC: 4.1 MIL/uL — AB (ref 4.22–5.81)
RDW: 15.3 % (ref 11.5–15.5)
WBC: 10.7 10*3/uL — AB (ref 4.0–10.5)

## 2017-03-23 LAB — BASIC METABOLIC PANEL
Anion gap: 6 (ref 5–15)
BUN: 10 mg/dL (ref 6–20)
CHLORIDE: 105 mmol/L (ref 101–111)
CO2: 27 mmol/L (ref 22–32)
Calcium: 8.5 mg/dL — ABNORMAL LOW (ref 8.9–10.3)
Creatinine, Ser: 1.09 mg/dL (ref 0.61–1.24)
GFR calc Af Amer: >60 mL/min (ref >60)
GLUCOSE: 172 mg/dL — AB (ref 65–99)
POTASSIUM: 3.6 mmol/L (ref 3.5–5.1)
Sodium: 138 mmol/L (ref 135–145)

## 2017-03-23 LAB — CBC
HEMATOCRIT: 34 % — AB (ref 39.0–52.0)
Hemoglobin: 11.4 g/dL — ABNORMAL LOW (ref 13.0–17.0)
MCH: 28.7 pg (ref 26.0–34.0)
MCHC: 33.5 g/dL (ref 30.0–36.0)
MCV: 85.6 fL (ref 78.0–100.0)
Platelets: 270 10*3/uL (ref 150–400)
RBC: 3.97 MIL/uL — ABNORMAL LOW (ref 4.22–5.81)
RDW: 15.1 % (ref 11.5–15.5)
WBC: 8 10*3/uL (ref 4.0–10.5)

## 2017-03-23 LAB — ECHOCARDIOGRAM COMPLETE
Height: 68 in
WEIGHTICAEL: 2000 [oz_av]

## 2017-03-23 LAB — MAGNESIUM: Magnesium: 2 mg/dL (ref 1.7–2.4)

## 2017-03-23 LAB — CREATININE, SERUM
Creatinine, Ser: 0.9 mg/dL (ref 0.61–1.24)
GFR calc Af Amer: >60 mL/min (ref >60)
GFR calc non Af Amer: >60 mL/min (ref >60)

## 2017-03-23 LAB — TROPONIN I
TROPONIN I: 0.09 ng/mL — AB (ref <0.03)
Troponin I: 0.09 ng/mL (ref <0.03)
Troponin I: 0.06 ng/mL (ref <0.03)

## 2017-03-23 LAB — I-STAT TROPONIN, ED: Troponin i, poc: 0 ng/mL (ref 0.00–0.08)

## 2017-03-23 LAB — HIV ANTIBODY (ROUTINE TESTING W REFLEX): HIV Screen 4th Generation wRfx: NONREACTIVE

## 2017-03-23 LAB — TSH: TSH: 0.517 u[IU]/mL (ref 0.350–4.500)

## 2017-03-23 LAB — BRAIN NATRIURETIC PEPTIDE: B Natriuretic Peptide: 1615.3 pg/mL — ABNORMAL HIGH (ref 0.0–100.0)

## 2017-03-23 LAB — PROCALCITONIN: PROCALCITONIN: 0.7 ng/mL

## 2017-03-23 MED ORDER — IPRATROPIUM-ALBUTEROL 0.5-2.5 (3) MG/3ML IN SOLN
3.0000 mL | RESPIRATORY_TRACT | Status: DC
  Administered 2017-03-23 (×4): 3 mL via RESPIRATORY_TRACT
  Filled 2017-03-23 (×3): qty 3

## 2017-03-23 MED ORDER — LISINOPRIL 2.5 MG PO TABS
2.5000 mg | ORAL_TABLET | Freq: Every day | ORAL | Status: DC
  Administered 2017-03-23 – 2017-03-25 (×3): 2.5 mg via ORAL
  Filled 2017-03-23 (×4): qty 1

## 2017-03-23 MED ORDER — ONDANSETRON HCL 4 MG/2ML IJ SOLN: 4.0000 mg | Freq: Four times a day (QID) | INTRAMUSCULAR | Status: DC | PRN

## 2017-03-23 MED ORDER — FUROSEMIDE 10 MG/ML IJ SOLN
40.0000 mg | Freq: Two times a day (BID) | INTRAMUSCULAR | Status: DC
  Administered 2017-03-23 – 2017-03-24 (×3): 40 mg via INTRAVENOUS
  Filled 2017-03-23 (×3): qty 4

## 2017-03-23 MED ORDER — POTASSIUM CHLORIDE CRYS ER 10 MEQ PO TBCR
10.0000 meq | EXTENDED_RELEASE_TABLET | Freq: Every day | ORAL | Status: DC
  Administered 2017-03-23 – 2017-03-24 (×2): 10 meq via ORAL
  Filled 2017-03-23 (×2): qty 1

## 2017-03-23 MED ORDER — IPRATROPIUM-ALBUTEROL 0.5-2.5 (3) MG/3ML IN SOLN
3.0000 mL | Freq: Three times a day (TID) | RESPIRATORY_TRACT | Status: DC
  Administered 2017-03-24 – 2017-03-26 (×7): 3 mL via RESPIRATORY_TRACT
  Filled 2017-03-23 (×8): qty 3

## 2017-03-23 MED ORDER — IPRATROPIUM-ALBUTEROL 0.5-2.5 (3) MG/3ML IN SOLN
RESPIRATORY_TRACT | Status: AC
  Administered 2017-03-23: 3 mL via RESPIRATORY_TRACT
  Filled 2017-03-23: qty 3

## 2017-03-23 MED ORDER — ONDANSETRON HCL 4 MG PO TABS: 4.0000 mg | ORAL_TABLET | Freq: Four times a day (QID) | ORAL | Status: DC | PRN

## 2017-03-23 MED ORDER — ACETAMINOPHEN 650 MG RE SUPP: 650.0000 mg | Freq: Four times a day (QID) | RECTAL | Status: DC | PRN

## 2017-03-23 MED ORDER — DEXTROSE 5 % IV SOLN
500.0000 mg | INTRAVENOUS | Status: DC
  Administered 2017-03-24 – 2017-03-25 (×2): 500 mg via INTRAVENOUS
  Filled 2017-03-23 (×2): qty 500

## 2017-03-23 MED ORDER — ASPIRIN 81 MG PO TBEC: 81.0000 mg | DELAYED_RELEASE_TABLET | Freq: Every day | ORAL | Status: DC

## 2017-03-23 MED ORDER — IPRATROPIUM-ALBUTEROL 0.5-2.5 (3) MG/3ML IN SOLN: 3.0000 mL | RESPIRATORY_TRACT | Status: DC | PRN

## 2017-03-23 MED ORDER — DEXTROSE 5 % IV SOLN
500.0000 mg | Freq: Once | INTRAVENOUS | Status: AC
  Administered 2017-03-23: 500 mg via INTRAVENOUS
  Filled 2017-03-23: qty 500

## 2017-03-23 MED ORDER — FUROSEMIDE 10 MG/ML IJ SOLN
40.0000 mg | INTRAMUSCULAR | Status: AC
  Administered 2017-03-23: 40 mg via INTRAVENOUS
  Filled 2017-03-23: qty 4

## 2017-03-23 MED ORDER — DEXTROSE 5 % IV SOLN
1.0000 g | INTRAVENOUS | Status: DC
  Administered 2017-03-23 – 2017-03-24 (×2): 1 g via INTRAVENOUS
  Filled 2017-03-23 (×3): qty 10

## 2017-03-23 MED ORDER — DEXTROSE 5 % IV SOLN
1.0000 g | Freq: Once | INTRAVENOUS | Status: AC
  Administered 2017-03-23: 1 g via INTRAVENOUS
  Filled 2017-03-23: qty 10

## 2017-03-23 MED ORDER — ASPIRIN EC 81 MG PO TBEC
81.0000 mg | DELAYED_RELEASE_TABLET | Freq: Every day | ORAL | Status: DC
  Administered 2017-03-23 – 2017-03-26 (×4): 81 mg via ORAL
  Filled 2017-03-23 (×4): qty 1

## 2017-03-23 MED ORDER — ENOXAPARIN SODIUM 40 MG/0.4ML ~~LOC~~ SOLN
40.0000 mg | SUBCUTANEOUS | Status: DC
  Administered 2017-03-23 – 2017-03-25 (×2): 40 mg via SUBCUTANEOUS
  Filled 2017-03-23 (×4): qty 0.4

## 2017-03-23 MED ORDER — ACETAMINOPHEN 325 MG PO TABS: 650.0000 mg | ORAL_TABLET | Freq: Four times a day (QID) | ORAL | Status: DC | PRN

## 2017-03-23 MED ORDER — BUDESONIDE 0.25 MG/2ML IN SUSP
0.2500 mg | Freq: Two times a day (BID) | RESPIRATORY_TRACT | Status: DC
  Administered 2017-03-23 – 2017-03-26 (×7): 0.25 mg via RESPIRATORY_TRACT
  Filled 2017-03-23 (×7): qty 2

## 2017-03-23 NOTE — ED Notes (Signed)
Date and time results received: 03/23/17  0603   Test: Troponin Critical Value: 0.09  Name of Provider Notified: Hal Hope, MD  Orders Received? Or Actions Taken?:  MD paged.

## 2017-03-23 NOTE — Progress Notes (Signed)
Patient seen and evaluated earlier this am by my associate. Please refer to H and P for details regarding assessment and plan.   Pt is a  56 y.o. male with history of ischemic cardiomyopathy, COPD, CAD, lung cancer under observation presents to the ER because of worsening shortness of breath over the last 4 days.  Patient states that shortness of breath increases on lying down.   He reports much improvement on current medical regimen. Will continue. CT scan reported scarring vs recurrent of lung cancer. This will be something that will need to be monitored as outpatient.  Gen: Pt in nad, alert and awake CV: s1 and s2 wnl Pulm: rhales, no wheezes, equal chest rise.  Will reassess next am.  Velvet Bathe

## 2017-03-23 NOTE — ED Notes (Signed)
RT at bedside at this time.

## 2017-03-23 NOTE — Progress Notes (Signed)
  Echocardiogram 2D Echocardiogram has been performed.  Bobbye Charleston 03/23/2017, 3:44 PM

## 2017-03-23 NOTE — ED Notes (Signed)
Pt unable to ambulated due to drop in SP02.

## 2017-03-23 NOTE — ED Provider Notes (Signed)
Patient presented to the ER with shortness of breath.  Face to face Exam: HEENT - PERRLA Lungs -diminished bilaterally with wheezing at the bases Heart - RRR, no M/R/G Abd - S/NT/ND Neuro - alert, oriented x3  Plan: Patient presents with hypoxia and significant shortness of breath secondary to COPD.  Patient administered bronchodilator therapy, with only minimal improvement.  Patient required BiPAP for a period of time, progressively improved.  He has been weaned to nasal cannula but still was hypoxic.  Will require hospitalization.   Orpah Greek, MD 03/23/17 [phone]

## 2017-03-23 NOTE — ED Notes (Signed)
Airborne precautions implemented. Sign placed on door.

## 2017-03-23 NOTE — ED Notes (Signed)
[name] d/t patient has not received lunch which was ordered by NS at 1152 via email. [facility] claimed no order placed. Order placed again via phone requesting lunch for this patient. Patient dissatisfied.

## 2017-03-23 NOTE — ED Triage Notes (Signed)
Pt from home bib [facility]. Pt c/o SOB woke up this am with SOB. Ems gave 10mg  albuterol 1mg  atrovent and 1,5 mg of mag sulfate. Pt c/o chest tightness.
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
## 2017-03-23 NOTE — ED Notes (Signed)
Pt. To CT via stretcher. 

## 2017-03-23 NOTE — ED Notes (Signed)
Pt taken of bi-pap. Pts Sp02 on room air dropped down to 85% lowest. Pt placed on [place] 4L/min. When resting pts sp02 back up to 94% on 4L [place] as long as pt is not moving around and requiring the use of a lot of energy. PA and RN notified.

## 2017-03-24 LAB — BASIC METABOLIC PANEL
Anion gap: 7 (ref 5–15)
BUN: 14 mg/dL (ref 6–20)
CHLORIDE: 100 mmol/L — AB (ref 101–111)
CO2: 31 mmol/L (ref 22–32)
CREATININE: 0.73 mg/dL (ref 0.61–1.24)
Calcium: 8.6 mg/dL — ABNORMAL LOW (ref 8.9–10.3)
GFR calc Af Amer: >60 mL/min (ref >60)
GFR calc non Af Amer: >60 mL/min (ref >60)
Glucose, Bld: 85 mg/dL (ref 65–99)
Potassium: 3.3 mmol/L — ABNORMAL LOW (ref 3.5–5.1)
Sodium: 138 mmol/L (ref 135–145)

## 2017-03-24 LAB — STREP PNEUMONIAE URINARY ANTIGEN: Strep Pneumo Urinary Antigen: NEGATIVE

## 2017-03-24 MED ORDER — FUROSEMIDE 10 MG/ML IJ SOLN
60.0000 mg | Freq: Two times a day (BID) | INTRAMUSCULAR | Status: DC
  Administered 2017-03-24 – 2017-03-25 (×2): 60 mg via INTRAVENOUS
  Filled 2017-03-24 (×2): qty 6

## 2017-03-24 MED ORDER — POTASSIUM CHLORIDE 10 MEQ/100ML IV SOLN
10.0000 meq | INTRAVENOUS | Status: AC
  Administered 2017-03-24 (×3): 10 meq via INTRAVENOUS
  Filled 2017-03-24: qty 100

## 2017-03-24 MED ORDER — POTASSIUM CHLORIDE CRYS ER 20 MEQ PO TBCR
20.0000 meq | EXTENDED_RELEASE_TABLET | Freq: Every day | ORAL | Status: DC
  Administered 2017-03-24 – 2017-03-26 (×3): 20 meq via ORAL
  Filled 2017-03-24 (×3): qty 1

## 2017-03-24 NOTE — Progress Notes (Signed)
PROGRESS NOTE    [name]  [id number]:[id number] DOB: May 27, 1961 DOA: 03/22/2017 PCP: Lucianne Lei, MD   Brief Narrative:  56 y.o. male with history of ischemic cardiomyopathy, COPD, CAD, lung cancer under observation presents to the ER because of worsening shortness of breath over the last 4 days.  Patient states that shortness of breath increases on lying down.  Denies any chest pain.  Has been having some productive cough for last 2 weeks.  Denies any hemoptysis.  Denies any fever chills.  Patient has noticed increasing lower extremity edema bilaterally over the ankles.   Assessment & Plan:   Principal Problem:   Acute on chronic respiratory failure with hypoxia (HCC) - improving on lasix. Will increase dose from 40 mg to 60 mg IV BID    Acute systolic CHF (congestive heart failure) ([place]) - Please see lasix discussion above. Echo reporting EF of 25 to 30 percent with grade 2 DD - will consult Cardiology for further assistance  Non sustained V tach - 8 beats and patient asymptomatic.  Also replace potassium. Question is whether systolic chf is bad enough to where the patient may need consideration for further treatment therapy. - Pt has ICD - discussed with cardiology and plan is to replace potassium and recheck magnesium. Extra potassium dosing order placed.  Active Problems:   Coronary artery disease - stable on aspirin    Hx of tuberculosis -  Leukocytosis resolved and patient afebrile, I do not suspect patient has active tuberculosis    CAP (community acquired pneumonia) - improving on current antibiotic regimen - Will narrow most likely next am.    COPD (chronic obstructive pulmonary disease) ([place]) - stable on pulmicort, duonebs    Lung cancer ([place]) -  Will need f/u given CT results. Recommendations are for continued outpatient evaluation  DVT prophylaxis: Lovenox Code Status: Full Family Communication: None at bedside. Disposition Plan: pending improvement in  breathing status back to baseline.   Consultants:   Cardiology   Procedures: Echocardiogram.   Antimicrobials: Azithromycin and Rocephin   Subjective: Pt has no new complaints, no acute issues overnight.  Objective: Vitals:   03/24/17 0913 03/24/17 0918 03/24/17 1101 03/24/17 1323  BP:   103/63   Pulse: 70  73 71  Resp: 18  16 16   Temp:   98.5 F (36.9 C)   TempSrc:   Oral   SpO2: 96% 96% 97% 100%  Weight:      Height:        Intake/Output Summary (Last 24 hours) at 03/24/17 1609 Last data filed at 03/24/17 1313  Gross per 24 hour  Intake             1260 ml  Output             1000 ml  Net              260 ml   Filed Weights   03/22/17 2355 03/23/17 1445 03/24/17 0500  Weight: 56.7 kg (125 lb) 56.3 kg (124 lb 1.6 oz) 56 kg (123 lb 7.3 oz)    Examination:  General exam: Appears calm and comfortable, in nad. Respiratory system: decreased breath sounds over bases, no wheezes, Equal chest rise.  Cardiovascular system: S1 & S2 heard, RRR. No rubs Gastrointestinal system: Abdomen is nondistended, soft and nontender. No organomegaly or masses felt. Normal bowel sounds heard. Central nervous system: Alert and oriented. No focal neurological deficits. Extremities: Symmetric 5 x 5 power. Skin: No rashes, lesions or  ulcers, on limited exam Psychiatry: Judgement and insight appear normal. Mood & affect appropriate.     Data Reviewed: I have personally reviewed following labs and imaging studies  CBC:  Recent Labs Lab 03/23/17 0001 03/23/17 0435  WBC 10.7* 8.0  NEUTROABS 4.6  --   HGB 11.9* 11.4*  HCT 35.3* 34.0*  MCV 86.1 85.6  PLT 297 619   Basic Metabolic Panel:  Recent Labs Lab 03/23/17 0001 03/23/17 0435 03/24/17 0520  NA 138  --  138  K 3.6  --  3.3*  CL 105  --  100*  CO2 27  --  31  GLUCOSE 172*  --  85  BUN 10  --  14  CREATININE 1.09 0.90 0.73  CALCIUM 8.5*  --  8.6*  MG  --  2.0  --    GFR: Estimated Creatinine Clearance: 83.6  mL/min (by C-G formula based on SCr of 0.73 mg/dL). Liver Function Tests: No results for input(s): AST, ALT, ALKPHOS, BILITOT, PROT, ALBUMIN in the last 168 hours. No results for input(s): LIPASE, AMYLASE in the last 168 hours. No results for input(s): AMMONIA in the last 168 hours. Coagulation Profile: No results for input(s): INR, PROTIME in the last 168 hours. Cardiac Enzymes:  Recent Labs Lab 03/23/17 0435 03/23/17 1029 03/23/17 1540  TROPONINI 0.09* 0.09* 0.06*   BNP (last 3 results) No results for input(s): PROBNP in the last 8760 hours. HbA1C: No results for input(s): HGBA1C in the last 72 hours. CBG: No results for input(s): GLUCAP in the last 168 hours. Lipid Profile: No results for input(s): CHOL, HDL, LDLCALC, TRIG, CHOLHDL, LDLDIRECT in the last 72 hours. Thyroid Function Tests:  Recent Labs  03/23/17 0435  TSH 0.517   Anemia Panel: No results for input(s): VITAMINB12, FOLATE, FERRITIN, TIBC, IRON, RETICCTPCT in the last 72 hours. Sepsis Labs:  Recent Labs Lab 03/23/17 0435  PROCALCITON 0.70    No results found for this or any previous visit (from the past 240 hour(s)).       Radiology Studies: Ct Chest Wo Contrast  Result Date: 03/23/2017 CLINICAL DATA:  Subacute onset of shortness of breath and cough. Initial encounter. EXAM: CT CHEST WITHOUT CONTRAST TECHNIQUE: Multidetector CT imaging of the chest was performed following the standard protocol without IV contrast. COMPARISON:  Chest radiograph performed earlier today at 12:10 a.m., and CT of the chest performed 11/07/2016 FINDINGS: Cardiovascular: The heart is normal in size. Evaluation of the vasculature is limited without contrast. Scattered calcification is noted along the aortic arch. The great vessels are not well assessed. A pacemaker is noted at the left chest wall, with a single lead ending at the right ventricle. Mediastinum/Nodes: Large periaortic nodes appear somewhat increased in size,  measuring up to 1.6 cm in short axis. No additional mediastinal lymphadenopathy is seen. No pericardial effusion is identified. The patient is status post median sternotomy. The thyroid gland is unremarkable. No axillary lymphadenopathy is seen. Lungs/Pleura: The extent of dense consolidation at the left upper lobe is mildly worsened from the prior studies, with worsening interstitial thickening noted bilaterally. Given underlying enlarging periaortic nodes, this may reflect worsening changes of chronic tuberculosis. Nodular pleural based opacity along the medial right lower lobe demonstrates architectural distortion, and measures 2.5 x 1.5 cm, increased in size from the prior study. Adjacent postoperative change is seen, and this may simply reflect scarring. However, recurrent malignancy cannot be entirely excluded. Previously noted nodules within the right lung have otherwise largely resolved, aside from  a small residual 6 mm nodule at the right lower lobe. No pleural effusion or pneumothorax is seen. Scarring at the right lung apex is relatively stable. Upper Abdomen: The visualized portions of the liver and spleen are grossly unremarkable. Musculoskeletal: No acute osseous abnormalities are identified. The visualized musculature is unremarkable in appearance. IMPRESSION: 1. Dense consolidation at the left upper lung lobe is mildly worsened from prior studies, with worsening interstitial thickening noted bilaterally. Given increasing size of underlying enlarged periaortic nodes, this may reflect worsening changes of chronic tuberculosis, or possibly other superimposed infection. 2. Nodular pleural-based opacity along the medial right lower lung lobe demonstrate appetite distortion, and measures 2.5 x 1.5 cm, increased in size from June. Adjacent postoperative change seen; this may simply reflect scarring. However, recurrent malignancy cannot be entirely excluded. PET/CT could be considered for further evaluation.  3. Stable scarring at the right lung apex. Electronically Signed   By: Garald Balding M.D.   On: 03/23/2017 06:33   Dg Chest Port 1 View  Result Date: 03/23/2017 CLINICAL DATA:  Acute onset of shortness of breath and wheezing. Initial encounter. EXAM: PORTABLE CHEST 1 VIEW COMPARISON:  Chest radiograph performed 11/02/2015, and CT of the chest performed 11/07/2016 FINDINGS: Chronic left upper lobe consolidation is unchanged in appearance. There is new right midlung and right basilar airspace opacity, concerning for pneumonia. Underlying pulmonary nodules are not well characterized. No definite pleural effusion or pneumothorax is seen. The cardiomediastinal silhouette is borderline enlarged. The patient is status post median sternotomy. An AICD is noted overlying the left chest wall, with a single lead ending overlying the right ventricle. No acute osseous abnormalities are identified. IMPRESSION: 1. New right midlung and right basilar airspace opacity, concerning for pneumonia. Underlying known pulmonary nodules are not well seen. Followup PA and lateral chest X-ray is recommended in 3-4 weeks following trial of antibiotic therapy to ensure resolution and exclude underlying malignancy. 2. Chronic left upper lobe consolidation is unchanged in appearance. 3. Borderline cardiomegaly. Electronically Signed   By: Garald Balding M.D.   On: 03/23/2017 00:32        Scheduled Meds: . aspirin EC  81 mg Oral Daily  . budesonide (PULMICORT) nebulizer solution  0.25 mg Nebulization BID  . enoxaparin (LOVENOX) injection  40 mg Subcutaneous Q24H  . furosemide  40 mg Intravenous Q12H  . ipratropium-albuterol  3 mL Nebulization TID  . lisinopril  2.5 mg Oral Daily  . potassium chloride  10 mEq Oral Daily   Continuous Infusions: . azithromycin Stopped (03/24/17 0200)  . cefTRIAXone (ROCEPHIN)  IV Stopped (03/23/17 2052)     LOS: 1 day    Time spent: > 35 minutes  Velvet Bathe, MD [facility] Pager [phone]  If 7PM-7AM, please contact night-coverage [email or web address] Password [email or web address] 03/24/2017, 4:09 PM

## 2017-03-24 NOTE — Care Management Note (Signed)
Case Management Note  Patient Details  Name: [name] MRN: [id number] Date of Birth: 11/16/1960  Subjective/Objective:  Acute on Chronic Resp Failure                Action/Plan: PCP: Lucianne Lei, MD; has private insurance with Medicare; CM following for DCP  Expected Discharge Date:  Possibly 03/28/2017            Expected Discharge Plan:  Home/Self Care  In-House Referral:   [facility]  Discharge planning Services  CM Consult  Status of Service:  In process, will continue to follow  Sherrilyn Rist [phone] 03/24/2017, 3:00 PM

## 2017-03-24 NOTE — Progress Notes (Signed)
Patient had 8 beats VT and Dr. Wendee Beavers informed via text page.  K+ level = 3.3 today. PO and IV potassium ordered.

## 2017-03-25 LAB — BASIC METABOLIC PANEL
Anion gap: 9 (ref 5–15)
BUN: 16 mg/dL (ref 6–20)
CALCIUM: 9 mg/dL (ref 8.9–10.3)
CHLORIDE: 97 mmol/L — AB (ref 101–111)
CO2: 31 mmol/L (ref 22–32)
Creatinine, Ser: 0.74 mg/dL (ref 0.61–1.24)
GFR calc Af Amer: >60 mL/min (ref >60)
GFR calc non Af Amer: >60 mL/min (ref >60)
GLUCOSE: 90 mg/dL (ref 65–99)
Potassium: 4 mmol/L (ref 3.5–5.1)
Sodium: 137 mmol/L (ref 135–145)

## 2017-03-25 LAB — MAGNESIUM: Magnesium: 1.9 mg/dL (ref 1.7–2.4)

## 2017-03-25 MED ORDER — FUROSEMIDE 40 MG PO TABS
40.0000 mg | ORAL_TABLET | Freq: Two times a day (BID) | ORAL | Status: DC
  Administered 2017-03-25 – 2017-03-26 (×2): 40 mg via ORAL
  Filled 2017-03-25 (×2): qty 1

## 2017-03-25 NOTE — Progress Notes (Signed)
PROGRESS NOTE    [name]  QIO:[id number] DOB: 07-10-1960 DOA: 03/22/2017 PCP: Lucianne Lei, MD   Brief Narrative:  56 y.o. male with history of ischemic cardiomyopathy, COPD, CAD, lung cancer under observation presents to the ER because of worsening shortness of breath over the last 4 days.  Patient states that shortness of breath increases on lying down.  Denies any chest pain.  Has been having some productive cough for last 2 weeks.  Denies any hemoptysis.  Denies any fever chills.  Patient has noticed increasing lower extremity edema bilaterally over the ankles.   Assessment & Plan:   Principal Problem:   Acute on chronic respiratory failure with hypoxia (HCC) - improved after IV lasix administration - will transition to oral lasix regimen - I do not believe patient has active infection. His condition drastically improved with IV lasix administration which is more consistent with acute CHF as cause of hypoxia. As such will d/c antibiotics    Acute systolic CHF (congestive heart failure) ([place]) - Please see lasix discussion above. Echo reporting EF of 25 to 30 percent with grade 2 DD - will consult Cardiology for further assistance  Non sustained V tach - 8 beats and patient asymptomatic.  Also replace potassium. Question is whether systolic chf is bad enough to where the patient may need consideration for further treatment therapy. - Pt has ICD - discussed with cardiology and plan is to replace potassium and recheck magnesium.  - resolved with potassium replacement.   Active Problems:   Coronary artery disease - stable on aspirin    Hx of tuberculosis -  Leukocytosis resolved and patient afebrile, I do not suspect patient has active tuberculosis - removing airborne precaution    CAP (community acquired pneumonia) - I do not suspect patient has active infection. Hospital course does no correlate. Pt is afebrile and wbc within normal limits. Will d/c antibiotics    COPD  (chronic obstructive pulmonary disease) ([place]) - stable on pulmicort, duonebs    Lung cancer ([place]) -  Will need f/u given CT results. Recommendations are for continued outpatient evaluation  DVT prophylaxis: Lovenox Code Status: Full Family Communication: None at bedside. Disposition Plan: d/c next am if patients breathing status continues to improve despite being on oral diuretic regimen.   Consultants:   Cardiology   Procedures: Echocardiogram.   Antimicrobials: Azithromycin and Rocephin   Subjective: Pt not quite at baseline  Objective: Vitals:   03/25/17 0753 03/25/17 0904 03/25/17 1107 03/25/17 1225  BP: 95/61  (!) 102/56 (!) 90/52  Pulse: 68  96 85  Resp: 18   20  Temp: 98.3 F (36.8 C)   97.9 F (36.6 C)  TempSrc: Oral   Oral  SpO2: 96% 95% 100% 100%  Weight: 53.4 kg (117 lb 11.6 oz)     Height:        Intake/Output Summary (Last 24 hours) at 03/25/17 1346 Last data filed at 03/25/17 0904  Gross per 24 hour  Intake             1460 ml  Output             1625 ml  Net             -165 ml   Filed Weights   03/23/17 1445 03/24/17 0500 03/25/17 0753  Weight: 56.3 kg (124 lb 1.6 oz) 56 kg (123 lb 7.3 oz) 53.4 kg (117 lb 11.6 oz)    Examination:  General exam: Appears calm  and comfortable, in nad. Respiratory system: equal chest rise, no wheezes, no rhales Cardiovascular system: S1 & S2 heard, RRR. No rubs Gastrointestinal system: Abdomen is nondistended, soft and nontender. No organomegaly or masses felt. Normal bowel sounds heard. Central nervous system: Alert and oriented. No focal neurological deficits. Extremities: Symmetric 5 x 5 power. Skin: No rashes, lesions or ulcers, on limited exam Psychiatry: Judgement and insight appear normal. Mood & affect appropriate.     Data Reviewed: I have personally reviewed following labs and imaging studies  CBC:  Recent Labs Lab 03/23/17 0001 03/23/17 0435  WBC 10.7* 8.0  NEUTROABS 4.6  --   HGB  11.9* 11.4*  HCT 35.3* 34.0*  MCV 86.1 85.6  PLT 297 106   Basic Metabolic Panel:  Recent Labs Lab 03/23/17 0001 03/23/17 0435 03/24/17 0520 03/25/17 0627  NA 138  --  138 137  K 3.6  --  3.3* 4.0  CL 105  --  100* 97*  CO2 27  --  31 31  GLUCOSE 172*  --  85 90  BUN 10  --  14 16  CREATININE 1.09 0.90 0.73 0.74  CALCIUM 8.5*  --  8.6* 9.0  MG  --  2.0  --  1.9   GFR: Estimated Creatinine Clearance: 79.7 mL/min (by C-G formula based on SCr of 0.74 mg/dL). Liver Function Tests: No results for input(s): AST, ALT, ALKPHOS, BILITOT, PROT, ALBUMIN in the last 168 hours. No results for input(s): LIPASE, AMYLASE in the last 168 hours. No results for input(s): AMMONIA in the last 168 hours. Coagulation Profile: No results for input(s): INR, PROTIME in the last 168 hours. Cardiac Enzymes:  Recent Labs Lab 03/23/17 0435 03/23/17 1029 03/23/17 1540  TROPONINI 0.09* 0.09* 0.06*   BNP (last 3 results) No results for input(s): PROBNP in the last 8760 hours. HbA1C: No results for input(s): HGBA1C in the last 72 hours. CBG: No results for input(s): GLUCAP in the last 168 hours. Lipid Profile: No results for input(s): CHOL, HDL, LDLCALC, TRIG, CHOLHDL, LDLDIRECT in the last 72 hours. Thyroid Function Tests:  Recent Labs  03/23/17 0435  TSH 0.517   Anemia Panel: No results for input(s): VITAMINB12, FOLATE, FERRITIN, TIBC, IRON, RETICCTPCT in the last 72 hours. Sepsis Labs:  Recent Labs Lab 03/23/17 0435  PROCALCITON 0.70    No results found for this or any previous visit (from the past 240 hour(s)).       Radiology Studies: No results found.      Scheduled Meds: . aspirin EC  81 mg Oral Daily  . budesonide (PULMICORT) nebulizer solution  0.25 mg Nebulization BID  . enoxaparin (LOVENOX) injection  40 mg Subcutaneous Q24H  . furosemide  40 mg Oral BID  . ipratropium-albuterol  3 mL Nebulization TID  . lisinopril  2.5 mg Oral Daily  . potassium chloride   20 mEq Oral Daily   Continuous Infusions: . azithromycin Stopped (03/25/17 0122)  . cefTRIAXone (ROCEPHIN)  IV Stopped (03/24/17 2329)     LOS: 2 days    Time spent: > 35 minutes  Velvet Bathe, MD [facility] Pager [phone]  If 7PM-7AM, please contact night-coverage [email or web address] Password [email or web address] 03/25/2017, 1:46 PM

## 2017-03-25 NOTE — Plan of Care (Signed)
Problem: Tissue Perfusion: Goal: Risk factors for ineffective tissue perfusion will decrease Outcome: Progressing Maintains oxygen saturation in the 90's on room air   Problem: Fluid Volume: Goal: Ability to maintain a balanced intake and output will improve Outcome: Progressing Continues to diurese   Problem: Activity: Goal: Capacity to carry out activities will improve Outcome: Progressing Reports shortness of breath is improved

## 2017-03-25 NOTE — Progress Notes (Signed)
Patient stable, no complaints or concerns from patient.

## 2017-03-26 LAB — BASIC METABOLIC PANEL
ANION GAP: 9 (ref 5–15)
BUN: 20 mg/dL (ref 6–20)
CALCIUM: 9 mg/dL (ref 8.9–10.3)
CO2: 31 mmol/L (ref 22–32)
CREATININE: 0.78 mg/dL (ref 0.61–1.24)
Chloride: 97 mmol/L — ABNORMAL LOW (ref 101–111)
GFR calc Af Amer: >60 mL/min (ref >60)
GFR calc non Af Amer: >60 mL/min (ref >60)
GLUCOSE: 68 mg/dL (ref 65–99)
Potassium: 4.3 mmol/L (ref 3.5–5.1)
Sodium: 137 mmol/L (ref 135–145)

## 2017-03-26 LAB — LEGIONELLA PNEUMOPHILA SEROGP 1 UR AG: L. pneumophila Serogp 1 Ur Ag: NEGATIVE

## 2017-03-26 MED ORDER — FUROSEMIDE 40 MG PO TABS: 40.0000 mg | ORAL_TABLET | Freq: Every day | ORAL | 0 refills | Status: DC

## 2017-03-26 MED ORDER — LISINOPRIL 2.5 MG PO TABS: 2.5000 mg | ORAL_TABLET | Freq: Every day | ORAL | 0 refills | Status: DC

## 2017-03-26 MED ORDER — POTASSIUM CHLORIDE CRYS ER 10 MEQ PO TBCR: 10.0000 meq | EXTENDED_RELEASE_TABLET | Freq: Every day | ORAL | 0 refills | Status: DC

## 2017-03-26 NOTE — Progress Notes (Signed)
Clinical Social Worker was consulted by patients RN to assist with transportation. CSW left bus pass for patients use with charge nurse. CSW signing off as patients needs have been met.  Rhea Pink, MSW,  [place]

## 2017-03-26 NOTE — Progress Notes (Signed)
Patient stable, respiratory status has greatly improved. No concerns or complaints of patient.

## 2017-03-26 NOTE — Discharge Summary (Signed)
Physician Discharge Summary  [name] KGY:[id number] DOB: 10-21-1960 DOA: 03/22/2017  PCP: Garrett Lei, MD  Admit date: 03/22/2017 Discharge date: 03/26/2017  Time spent: > 35 minutes  Recommendations for Outpatient Follow-up:  1. Per imaging study: New right midlung and right basilar airspace opacity, concerning for pneumonia. Underlying known pulmonary nodules are not well seen. Followup PA and lateral chest X-ray is recommended in 3-4 weeks following trial of antibiotic therapy to ensure resolution and exclude underlying malignancy.  Pt has systolic CHF and was not on lasix at home. Will d/c on lasix  Discharge Diagnoses:  Principal Problem:   Acute on chronic respiratory failure with hypoxia ([place]) Active Problems:   Coronary artery disease   Hx of tuberculosis   CAP (community acquired pneumonia)   COPD (chronic obstructive pulmonary disease) (HCC)   Lung cancer (HCC)   CHF (congestive heart failure) (HCC)   Acute systolic CHF (congestive heart failure) ([place])   Discharge Condition: stable  Diet recommendation: heart healthy  Filed Weights   03/24/17 0500 03/25/17 0753 03/26/17 0532  Weight: 56 kg (123 lb 7.3 oz) 53.4 kg (117 lb 11.6 oz) 54 kg (119 lb)    History of present illness:  56 y.o.malewith history of ischemic cardiomyopathy, COPD, CAD, lung cancer under observation presents to the ER because of worsening shortness of breath over the last 4 days. Patient states that shortness of breath increases on lying down. Denies any chest pain. Has been having some productive cough for last 2 weeks. Denies any hemoptysis. Denies any fever chills. Patient has noticed increasing lower extremity edema bilaterally over the ankles.  Hospital Course:  Principal Problem:   Acute on chronic respiratory failure with hypoxia (HCC) - improved after IV lasix administration -Transitioned to oral lasix regimen - I do not believe patient has active infection. His condition  drastically improved with IV lasix administration which is more consistent with acute CHF as cause of hypoxia. Pt condition continued to improve despite being off antibiotics further making me feel his SOB was not due to infection but rather Acute systolic CHF exacerbation. Pt reports he was not on lasix at home.    Acute systolic CHF (congestive heart failure) ([place]) - Please see lasix discussion above. Echo reporting EF of 25 to 30 percent with grade 2 DD - will d/c on oral lasix regimen and K replacement  Non sustained V tach - 8 beats and patient asymptomatic.  resolved after K replacement - Pt has ICD - discussed with cardiology and plan was to replace potassium and recheck magnesium. With improvement in K levels no red flags were reported to me.  Active Problems:   Coronary artery disease - stable on aspirin    Hx of tuberculosis -  Leukocytosis resolved and patient afebrile, I do not suspect patient has active tuberculosis.    CAP (community acquired pneumonia) - I do not suspect patient has active infection. Hospital course does no correlate. Pt is afebrile and wbc within normal limits. Pt did well off antibiotics. SOB improved with lasix administration    COPD (chronic obstructive pulmonary disease) ([place]) - stable on pulmicort, duonebs    Lung cancer ([place]) -  Will need f/u given CT results. Recommendations are for continued outpatient evaluation   Procedures:  None  Consultations:  None  Discharge Exam: Vitals:   03/26/17 0934 03/26/17 0937  BP: (!) 82/49 90/60  Pulse: 98   Resp:    Temp:    SpO2: 100%     General:  Pt in nad, alert and awake Cardiovascular: rrr, no rubs Respiratory: no increased wob, no wheezes  Discharge Instructions   Discharge Instructions    (HEART FAILURE PATIENTS) Call MD:  Anytime you have any of the following symptoms: 1) 3 pound weight gain in 24 hours or 5 pounds in 1 week 2) shortness of breath, with or without a dry  hacking cough 3) swelling in the hands, feet or stomach 4) if you have to sleep on extra pillows at night in order to breathe.    Complete by:  As directed    Call MD for:  difficulty breathing, headache or visual disturbances    Complete by:  As directed    Call MD for:  temperature >100.4    Complete by:  As directed    Diet - low sodium heart healthy    Complete by:  As directed    Discharge instructions    Complete by:  As directed    Please follow up with your primary care physician so that you can keep tabs on abnormal finding on CT scan to ensure pulmonary cancer is not recurring.   Increase activity slowly    Complete by:  As directed      Current Discharge Medication List    START taking these medications   Details  furosemide (LASIX) 40 MG tablet Take 1 tablet (40 mg total) by mouth daily. Qty: 30 tablet, Refills: 0    lisinopril (PRINIVIL,ZESTRIL) 2.5 MG tablet Take 1 tablet (2.5 mg total) by mouth daily. Qty: 30 tablet, Refills: 0    potassium chloride SA (K-DUR,KLOR-CON) 10 MEQ tablet Take 1 tablet (10 mEq total) by mouth daily. Qty: 30 tablet, Refills: 0      CONTINUE these medications which have NOT CHANGED   Details  ADVAIR DISKUS 100-50 MCG/DOSE AEPB INHALE CONTENTS OF 1 BLISTER USING DISKUS TWO TIMES DAILY Qty: 120 each, Refills: 5    ASPIRIN LOW DOSE 81 MG EC tablet TAKE 1 TABLET BY MOUTH EVERY DAY Qty: 30 tablet, Refills: 2    SPIRIVA HANDIHALER 18 MCG inhalation capsule PLACE 1 CAPSULE INTO HANDIHALER AND INHALE DAILY Qty: 30 capsule, Refills: 5       No Known Allergies    The results of significant diagnostics from this hospitalization (including imaging, microbiology, ancillary and laboratory) are listed below for reference.    Significant Diagnostic Studies: Ct Chest Wo Contrast  Result Date: 03/23/2017 CLINICAL DATA:  Subacute onset of shortness of breath and cough. Initial encounter. EXAM: CT CHEST WITHOUT CONTRAST TECHNIQUE: Multidetector  CT imaging of the chest was performed following the standard protocol without IV contrast. COMPARISON:  Chest radiograph performed earlier today at 12:10 a.m., and CT of the chest performed 11/07/2016 FINDINGS: Cardiovascular: The heart is normal in size. Evaluation of the vasculature is limited without contrast. Scattered calcification is noted along the aortic arch. The great vessels are not well assessed. A pacemaker is noted at the left chest wall, with a single lead ending at the right ventricle. Mediastinum/Nodes: Large periaortic nodes appear somewhat increased in size, measuring up to 1.6 cm in short axis. No additional mediastinal lymphadenopathy is seen. No pericardial effusion is identified. The patient is status post median sternotomy. The thyroid gland is unremarkable. No axillary lymphadenopathy is seen. Lungs/Pleura: The extent of dense consolidation at the left upper lobe is mildly worsened from the prior studies, with worsening interstitial thickening noted bilaterally. Given underlying enlarging periaortic nodes, this may reflect worsening changes of chronic  tuberculosis. Nodular pleural based opacity along the medial right lower lobe demonstrates architectural distortion, and measures 2.5 x 1.5 cm, increased in size from the prior study. Adjacent postoperative change is seen, and this may simply reflect scarring. However, recurrent malignancy cannot be entirely excluded. Previously noted nodules within the right lung have otherwise largely resolved, aside from a small residual 6 mm nodule at the right lower lobe. No pleural effusion or pneumothorax is seen. Scarring at the right lung apex is relatively stable. Upper Abdomen: The visualized portions of the liver and spleen are grossly unremarkable. Musculoskeletal: No acute osseous abnormalities are identified. The visualized musculature is unremarkable in appearance. IMPRESSION: 1. Dense consolidation at the left upper lung lobe is mildly worsened  from prior studies, with worsening interstitial thickening noted bilaterally. Given increasing size of underlying enlarged periaortic nodes, this may reflect worsening changes of chronic tuberculosis, or possibly other superimposed infection. 2. Nodular pleural-based opacity along the medial right lower lung lobe demonstrate appetite distortion, and measures 2.5 x 1.5 cm, increased in size from June. Adjacent postoperative change seen; this may simply reflect scarring. However, recurrent malignancy cannot be entirely excluded. PET/CT could be considered for further evaluation. 3. Stable scarring at the right lung apex. Electronically Signed   By: Garald Balding M.D.   On: 03/23/2017 06:33   Dg Chest Port 1 View  Result Date: 03/23/2017 CLINICAL DATA:  Acute onset of shortness of breath and wheezing. Initial encounter. EXAM: PORTABLE CHEST 1 VIEW COMPARISON:  Chest radiograph performed 11/02/2015, and CT of the chest performed 11/07/2016 FINDINGS: Chronic left upper lobe consolidation is unchanged in appearance. There is new right midlung and right basilar airspace opacity, concerning for pneumonia. Underlying pulmonary nodules are not well characterized. No definite pleural effusion or pneumothorax is seen. The cardiomediastinal silhouette is borderline enlarged. The patient is status post median sternotomy. An AICD is noted overlying the left chest wall, with a single lead ending overlying the right ventricle. No acute osseous abnormalities are identified. IMPRESSION: 1. New right midlung and right basilar airspace opacity, concerning for pneumonia. Underlying known pulmonary nodules are not well seen. Followup PA and lateral chest X-ray is recommended in 3-4 weeks following trial of antibiotic therapy to ensure resolution and exclude underlying malignancy. 2. Chronic left upper lobe consolidation is unchanged in appearance. 3. Borderline cardiomegaly. Electronically Signed   By: Garald Balding M.D.   On:  03/23/2017 00:32    Microbiology: No results found for this or any previous visit (from the past 240 hour(s)).   Labs: Basic Metabolic Panel:  Recent Labs Lab 03/23/17 0001 03/23/17 0435 03/24/17 0520 03/25/17 0627 03/26/17 0441  NA 138  --  138 137 137  K 3.6  --  3.3* 4.0 4.3  CL 105  --  100* 97* 97*  CO2 27  --  31 31 31   GLUCOSE 172*  --  85 90 68  BUN 10  --  14 16 20   CREATININE 1.09 0.90 0.73 0.74 0.78  CALCIUM 8.5*  --  8.6* 9.0 9.0  MG  --  2.0  --  1.9  --    Liver Function Tests: No results for input(s): AST, ALT, ALKPHOS, BILITOT, PROT, ALBUMIN in the last 168 hours. No results for input(s): LIPASE, AMYLASE in the last 168 hours. No results for input(s): AMMONIA in the last 168 hours. CBC:  Recent Labs Lab 03/23/17 0001 03/23/17 0435  WBC 10.7* 8.0  NEUTROABS 4.6  --   HGB 11.9* 11.4*  HCT 35.3* 34.0*  MCV 86.1 85.6  PLT 297 270   Cardiac Enzymes:  Recent Labs Lab 03/23/17 0435 03/23/17 1029 03/23/17 1540  TROPONINI 0.09* 0.09* 0.06*   BNP: BNP (last 3 results)  Recent Labs  03/23/17 0001  BNP 1,615.3*    ProBNP (last 3 results) No results for input(s): PROBNP in the last 8760 hours.  CBG: No results for input(s): GLUCAP in the last 168 hours.   Signed:  Velvet Bathe MD.  [facility] 03/26/2017, 1:25 PM

## 2017-03-26 NOTE — Progress Notes (Signed)
Discharge instructions reviewed with patient, questions answered, verbalized understanding.  Patient transported to main entrance of hospital to await ride home.

## 2017-03-29 ENCOUNTER — Other Ambulatory Visit (HOSPITAL_BASED_OUTPATIENT_CLINIC_OR_DEPARTMENT_OTHER): Payer: Medicare Other

## 2017-03-29 ENCOUNTER — Ambulatory Visit (HOSPITAL_COMMUNITY): Admission: RE | Admit: 2017-03-29 | Payer: Medicare Other | Source: Ambulatory Visit

## 2017-03-29 DIAGNOSIS — C3491 Malignant neoplasm of unspecified part of right bronchus or lung: Secondary | ICD-10-CM

## 2017-03-29 LAB — COMPREHENSIVE METABOLIC PANEL
ALBUMIN: 3.4 g/dL — AB (ref 3.5–5.0)
ALK PHOS: 77 U/L (ref 40–150)
ALT: 17 U/L (ref 0–55)
ANION GAP: 8 meq/L (ref 3–11)
AST: 19 U/L (ref 5–34)
BUN: 19.5 mg/dL (ref 7.0–26.0)
CALCIUM: 9.5 mg/dL (ref 8.4–10.4)
CO2: 25 mEq/L (ref 22–29)
Chloride: 104 mEq/L (ref 98–109)
Creatinine: 0.9 mg/dL (ref 0.7–1.3)
Glucose: 100 mg/dl (ref 70–140)
Potassium: 4.8 mEq/L (ref 3.5–5.1)
Sodium: 137 mEq/L (ref 136–145)
Total Bilirubin: 0.47 mg/dL (ref 0.20–1.20)
Total Protein: 8.9 g/dL — ABNORMAL HIGH (ref 6.4–8.3)

## 2017-03-29 LAB — CBC WITH DIFFERENTIAL/PLATELET
BASO%: 1 % (ref 0.0–2.0)
Basophils Absolute: 0.1 10*3/uL (ref 0.0–0.1)
EOS ABS: 0.3 10*3/uL (ref 0.0–0.5)
EOS%: 3.6 % (ref 0.0–7.0)
HEMATOCRIT: 40.6 % (ref 38.4–49.9)
HEMOGLOBIN: 13.5 g/dL (ref 13.0–17.1)
LYMPH%: 36.5 % (ref 14.0–49.0)
MCH: 29 pg (ref 27.2–33.4)
MCHC: 33.3 g/dL (ref 32.0–36.0)
MCV: 87.1 fL (ref 79.3–98.0)
MONO#: 0.5 10*3/uL (ref 0.1–0.9)
MONO%: 7.6 % (ref 0.0–14.0)
NEUT#: 3.5 10*3/uL (ref 1.5–6.5)
NEUT%: 51.3 % (ref 39.0–75.0)
Platelets: 336 10*3/uL (ref 140–400)
RBC: 4.67 10*6/uL (ref 4.20–5.82)
RDW: 15.4 % — ABNORMAL HIGH (ref 11.0–14.6)
WBC: 6.9 10*3/uL (ref 4.0–10.3)
lymph#: 2.5 10*3/uL (ref 0.9–3.3)

## 2017-03-30 ENCOUNTER — Other Ambulatory Visit: Payer: Self-pay | Admitting: Internal Medicine

## 2017-04-05 ENCOUNTER — Encounter: Payer: Self-pay | Admitting: Internal Medicine

## 2017-04-05 ENCOUNTER — Telehealth: Payer: Self-pay | Admitting: Internal Medicine

## 2017-04-05 ENCOUNTER — Ambulatory Visit (HOSPITAL_BASED_OUTPATIENT_CLINIC_OR_DEPARTMENT_OTHER): Payer: Medicare Other | Admitting: Internal Medicine

## 2017-04-05 DIAGNOSIS — C3431 Malignant neoplasm of lower lobe, right bronchus or lung: Secondary | ICD-10-CM | POA: Diagnosis not present

## 2017-04-05 DIAGNOSIS — C3490 Malignant neoplasm of unspecified part of unspecified bronchus or lung: Secondary | ICD-10-CM
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
## 2017-04-05 NOTE — Telephone Encounter (Signed)
Gave avs and calendar for march 2019 

## 2017-04-10 ENCOUNTER — Encounter: Payer: Self-pay | Admitting: Internal Medicine

## 2017-04-10 ENCOUNTER — Ambulatory Visit (INDEPENDENT_AMBULATORY_CARE_PROVIDER_SITE_OTHER): Payer: Medicare Other | Admitting: Internal Medicine

## 2017-04-10 VITALS — BP 102/62 | HR 89 | Ht 68.0 in | Wt 126.0 lb

## 2017-04-10 DIAGNOSIS — I255 Ischemic cardiomyopathy: Secondary | ICD-10-CM | POA: Diagnosis not present

## 2017-04-10 DIAGNOSIS — F17200 Nicotine dependence, unspecified, uncomplicated: Secondary | ICD-10-CM

## 2017-04-10 DIAGNOSIS — J449 Chronic obstructive pulmonary disease, unspecified: Secondary | ICD-10-CM | POA: Diagnosis not present

## 2017-04-10 DIAGNOSIS — Z23 Encounter for immunization: Secondary | ICD-10-CM

## 2017-04-10 MED ORDER — VARENICLINE TARTRATE 0.5 MG X 11 & 1 MG X 42 PO MISC: ORAL | 0 refills | Status: DC
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
## 2017-04-10 NOTE — Patient Instructions (Addendum)
ICD-10-CM   1. Smoker F17.200   2. Chronic obstructive pulmonary disease, unspecified COPD type ([place]) J44.9     Smoker -I wish there was inpatient rehabilitation for quitting smoking  Recommend you do your own online search-to see if a drug rehab facility would accept you for quitting smoking  Meanwhile, #SMoking  - -  start chantix as directed  - Days 1-3: 0.5 mg once daily  - Days 4-7: 0.5 mg twice daily  - Maintenance (? Day 8):  1 mg twice daily for 11 weeks   - if you have bad dream stop medication and call us  - take the medicaiton as instructed and after food - quit smoking  1 week after starting chantix        Chronic obstructive pulmonary disease, unspecified COPD type ([place])  -Stable disease -Recent admission according to the hospitalist in October 2018 was more heart failure than pneumonia -Continue Spiriva and Advair -Flu shot today April 10, 2017 -Keep a follow-up with Dr. [name] especially with the scan  Follow-up 3-6 months or sooner if needed

## 2017-04-29 ENCOUNTER — Other Ambulatory Visit: Payer: Self-pay | Admitting: Internal Medicine

## 2017-07-20 ENCOUNTER — Telehealth: Payer: Self-pay

## 2017-07-20 NOTE — Telephone Encounter (Signed)
Attempted PA for Spiriva 18 mcg.   Fax sent to the plan   Your PA has been faxed to the plan as a paper copy. Please contact the plan directly if you haven't received a determination in a typical timeframe.  You will be notified of the determination via fax.  How do I know if the plan approved the PA?  Add Reminder to your Dashboard  Remind me in:    Will route to emily for follow up

## 2017-07-21 NOTE — Telephone Encounter (Signed)
Received fax from pt's insurance stating that pt has been approved for coverage of Spiriva from 05/28/17-05/29/18.  [place] and spoke with Leanne letting her know this information. Leanne also ran the claim and was able to see this as well.  Nothing further needed at this current time.

## 2017-07-26 ENCOUNTER — Telehealth: Payer: Self-pay | Admitting: Internal Medicine

## 2017-07-26 NOTE — Telephone Encounter (Signed)
Received a fax from [employer] [place] in regards to forms that need to be filled out for pt from vocational rehabilitation services and independent living.  Per MR, pt needs to come in for a visit with an APP and at that visit, they can fill out the forms that need to be taken care of for pt and then we can fax them to the proper place.  On the form, it states they need to have records from 05/29/16 to present so I have gone ahead and printed out the pt's OV's he recently had with our office and have it paperclipped with the forms that need to be filled out.  Attempted to call pt stating to him we needed to get him scheduled for an appt, but pt did not answer. Left message for pt to return our call so that we can get him scheduled for an OV.

## 2017-07-27 NOTE — Telephone Encounter (Signed)
Patient returned call, scheduled OV with TP for tomorrow, 03/01.  No call back is needed.

## 2017-07-28 ENCOUNTER — Ambulatory Visit (INDEPENDENT_AMBULATORY_CARE_PROVIDER_SITE_OTHER): Payer: Medicare Other | Admitting: Adult Health

## 2017-07-28 ENCOUNTER — Ambulatory Visit (HOSPITAL_COMMUNITY)
Admission: RE | Admit: 2017-07-28 | Discharge: 2017-07-28 | Disposition: A | Discharge status: Home / Self Care | Payer: Medicare Other | Source: Ambulatory Visit | Attending: Internal Medicine | Admitting: Internal Medicine

## 2017-07-28 ENCOUNTER — Encounter: Payer: Self-pay | Admitting: Adult Health

## 2017-07-28 VITALS — BP 132/76 | HR 89 | Ht 68.0 in | Wt 131.0 lb

## 2017-07-28 DIAGNOSIS — C3490 Malignant neoplasm of unspecified part of unspecified bronchus or lung: Secondary | ICD-10-CM | POA: Diagnosis present

## 2017-07-28 DIAGNOSIS — I5022 Chronic systolic (congestive) heart failure: Secondary | ICD-10-CM

## 2017-07-28 DIAGNOSIS — C3491 Malignant neoplasm of unspecified part of right bronchus or lung: Secondary | ICD-10-CM

## 2017-07-28 DIAGNOSIS — J431 Panlobular emphysema: Secondary | ICD-10-CM | POA: Diagnosis not present

## 2017-07-28 DIAGNOSIS — J439 Emphysema, unspecified: Secondary | ICD-10-CM | POA: Diagnosis not present

## 2017-07-28 DIAGNOSIS — I700 Atherosclerosis of aorta: Secondary | ICD-10-CM | POA: Insufficient documentation

## 2017-07-28 MED ORDER — IOPAMIDOL (ISOVUE-300) INJECTION 61%
75.0000 mL | Freq: Once | INTRAVENOUS | Status: AC | PRN
  Administered 2017-07-28: 75 mL via INTRAVENOUS

## 2017-07-28 MED ORDER — SODIUM CHLORIDE 0.9 % IJ SOLN
INTRAMUSCULAR | Status: AC
  Filled 2017-07-28: qty 50

## 2017-07-28 MED ORDER — IOPAMIDOL (ISOVUE-300) INJECTION 61%
INTRAVENOUS | Status: DC
Start: 2017-07-28 — End: 2017-07-29
  Filled 2017-07-28: qty 75

## 2017-07-28 NOTE — Assessment & Plan Note (Signed)
Set up follow up with cards

## 2017-07-28 NOTE — Assessment & Plan Note (Signed)
History of lung cancer with follow-up with oncology next week
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
## 2017-07-28 NOTE — Assessment & Plan Note (Signed)
Compensated on present regimen.  Patient is encouraged on smoking cessation  vocational job work was completed  Plan  Patient Instructions  Refer to to cardiology to re-establish . -Dr. Caryl Comes  Continue on Advair and Spiriva .  Work on not smoking .  Follow up with Dr. Chase Caller in 4 months with PFT and As needed   Follow up with Dr. [name] as planned.

## 2017-07-28 NOTE — Patient Instructions (Signed)
Refer to to cardiology to re-establish . -Dr. Caryl Comes  Continue on Advair and Spiriva .  Work on not smoking .  Follow up with Dr. Chase Caller in 4 months with PFT and As needed   Follow up with Dr. [name] as planned.

## 2017-08-03 ENCOUNTER — Inpatient Hospital Stay: Payer: Medicare Other | Attending: Internal Medicine

## 2017-08-03 DIAGNOSIS — C3490 Malignant neoplasm of unspecified part of unspecified bronchus or lung: Secondary | ICD-10-CM

## 2017-08-03 DIAGNOSIS — C3431 Malignant neoplasm of lower lobe, right bronchus or lung: Secondary | ICD-10-CM | POA: Insufficient documentation

## 2017-08-03 DIAGNOSIS — Z85118 Personal history of other malignant neoplasm of bronchus and lung: Secondary | ICD-10-CM | POA: Insufficient documentation

## 2017-08-03 DIAGNOSIS — Z79899 Other long term (current) drug therapy: Secondary | ICD-10-CM | POA: Insufficient documentation

## 2017-08-03 DIAGNOSIS — Z923 Personal history of irradiation: Secondary | ICD-10-CM | POA: Insufficient documentation

## 2017-08-03 LAB — COMPREHENSIVE METABOLIC PANEL
ALK PHOS: 67 U/L (ref 40–150)
ALT: 10 U/L (ref 0–55)
AST: 13 U/L (ref 5–34)
Albumin: 3 g/dL — ABNORMAL LOW (ref 3.5–5.0)
Anion gap: 6 (ref 3–11)
BUN: 7 mg/dL (ref 7–26)
CALCIUM: 9.2 mg/dL (ref 8.4–10.4)
CO2: 26 mmol/L (ref 22–29)
CREATININE: 0.81 mg/dL (ref 0.70–1.30)
Chloride: 106 mmol/L (ref 98–109)
Glucose, Bld: 83 mg/dL (ref 70–140)
Potassium: 4.3 mmol/L (ref 3.5–5.1)
Sodium: 138 mmol/L (ref 136–145)
Total Bilirubin: 0.4 mg/dL (ref 0.2–1.2)
Total Protein: 8.1 g/dL (ref 6.4–8.3)

## 2017-08-03 LAB — CBC WITH DIFFERENTIAL/PLATELET
BASOS PCT: 1 %
Basophils Absolute: 0 10*3/uL (ref 0.0–0.1)
EOS ABS: 0.1 10*3/uL (ref 0.0–0.5)
EOS PCT: 2 %
HCT: 39.8 % (ref 38.4–49.9)
HEMOGLOBIN: 13.3 g/dL (ref 13.0–17.1)
Lymphocytes Relative: 37 %
Lymphs Abs: 2.2 10*3/uL (ref 0.9–3.3)
MCH: 28.4 pg (ref 27.2–33.4)
MCHC: 33.4 g/dL (ref 32.0–36.0)
MCV: 84.9 fL (ref 79.3–98.0)
MONOS PCT: 5 %
Monocytes Absolute: 0.3 10*3/uL (ref 0.1–0.9)
NEUTROS PCT: 55 %
Neutro Abs: 3.3 10*3/uL (ref 1.5–6.5)
Platelets: 377 10*3/uL (ref 140–400)
RBC: 4.69 MIL/uL (ref 4.20–5.82)
RDW: 15 % — ABNORMAL HIGH (ref 11.0–14.6)
WBC: 5.9 10*3/uL (ref 4.0–10.3)

## 2017-08-08 ENCOUNTER — Telehealth: Payer: Self-pay | Admitting: Internal Medicine

## 2017-08-08 ENCOUNTER — Encounter: Payer: Self-pay | Admitting: Internal Medicine

## 2017-08-08 ENCOUNTER — Inpatient Hospital Stay (HOSPITAL_BASED_OUTPATIENT_CLINIC_OR_DEPARTMENT_OTHER): Payer: Medicare Other | Admitting: Internal Medicine

## 2017-08-08 VITALS — BP 129/73 | HR 92 | Temp 98.6°F | Resp 20 | Ht 68.0 in | Wt 128.1 lb

## 2017-08-08 DIAGNOSIS — C3490 Malignant neoplasm of unspecified part of unspecified bronchus or lung: Secondary | ICD-10-CM

## 2017-08-08 DIAGNOSIS — Z79899 Other long term (current) drug therapy: Secondary | ICD-10-CM | POA: Diagnosis not present

## 2017-08-08 DIAGNOSIS — Z85118 Personal history of other malignant neoplasm of bronchus and lung: Secondary | ICD-10-CM

## 2017-08-08 DIAGNOSIS — Z923 Personal history of irradiation: Secondary | ICD-10-CM

## 2017-08-08 DIAGNOSIS — C3491 Malignant neoplasm of unspecified part of right bronchus or lung: Secondary | ICD-10-CM

## 2017-08-08 DIAGNOSIS — C3431 Malignant neoplasm of lower lobe, right bronchus or lung: Secondary | ICD-10-CM | POA: Diagnosis not present

## 2017-08-08 DIAGNOSIS — F17200 Nicotine dependence, unspecified, uncomplicated: Secondary | ICD-10-CM

## 2017-08-08 NOTE — Telephone Encounter (Signed)
Gave patient avs and calendar with appts per 3/12 los.
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
## 2017-09-08 DIAGNOSIS — Z0000 Encounter for general adult medical examination without abnormal findings: Secondary | ICD-10-CM | POA: Diagnosis not present

## 2017-09-15 DIAGNOSIS — I5022 Chronic systolic (congestive) heart failure: Secondary | ICD-10-CM | POA: Diagnosis not present

## 2017-09-15 DIAGNOSIS — J438 Other emphysema: Secondary | ICD-10-CM | POA: Diagnosis not present

## 2017-09-15 DIAGNOSIS — J449 Chronic obstructive pulmonary disease, unspecified: Secondary | ICD-10-CM | POA: Diagnosis not present

## 2017-09-15 DIAGNOSIS — J8410 Pulmonary fibrosis, unspecified: Secondary | ICD-10-CM | POA: Diagnosis not present

## 2017-09-15 DIAGNOSIS — I5040 Unspecified combined systolic (congestive) and diastolic (congestive) heart failure: Secondary | ICD-10-CM | POA: Diagnosis not present

## 2017-09-21 ENCOUNTER — Ambulatory Visit (INDEPENDENT_AMBULATORY_CARE_PROVIDER_SITE_OTHER): Payer: Medicare HMO | Admitting: Internal Medicine

## 2017-09-21 ENCOUNTER — Encounter: Payer: Self-pay | Admitting: Internal Medicine

## 2017-09-21 VITALS — BP 114/80 | HR 67 | Ht 68.0 in | Wt 131.0 lb

## 2017-09-21 DIAGNOSIS — Z95810 Presence of automatic (implantable) cardiac defibrillator: Secondary | ICD-10-CM

## 2017-09-21 DIAGNOSIS — I255 Ischemic cardiomyopathy: Secondary | ICD-10-CM | POA: Diagnosis not present

## 2017-09-21 LAB — CUP PACEART INCLINIC DEVICE CHECK
Battery Remaining Longevity: 64 mo
Brady Statistic RV Percent Paced: 0 %
Date Time Interrogation Session: 20190425153903
HighPow Impedance: 59.625
Implantable Lead Location: 753860
Implantable Lead Model: 181
Implantable Lead Serial Number: 325827
Implantable Pulse Generator Implant Date: 20140929
Lead Channel Pacing Threshold Amplitude: 1 V
Lead Channel Pacing Threshold Pulse Width: 0.5 ms
Lead Channel Pacing Threshold Pulse Width: 0.5 ms
Lead Channel Sensing Intrinsic Amplitude: 11.3 mV
Lead Channel Setting Pacing Pulse Width: 0.5 ms
Lead Channel Setting Sensing Sensitivity: 0.5 mV
MDC IDC LEAD IMPLANT DT: 20140929
MDC IDC MSMT LEADCHNL RV IMPEDANCE VALUE: 337.5 Ohm
MDC IDC MSMT LEADCHNL RV PACING THRESHOLD AMPLITUDE: 1 V
MDC IDC SET LEADCHNL RV PACING AMPLITUDE: 2.5 V
Pulse Gen Serial Number: 7132440
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
## 2017-09-21 NOTE — Patient Instructions (Addendum)
Medication Instructions:  Your physician has recommended you make the following change in your medication:   1. Begin Entresto 24/26. Take one tablet in the AM and one table in the PM.     You will need to return for blood work around May 7th.  Labwork: Your physician recommends that you return for lab work on May 7th-- BMP  Testing/Procedures: Your physician has requested that you have an echocardiogram. Echocardiography is a painless test that uses sound waves to create images of your heart. It provides your doctor with information about the size and shape of your heart and how well your heart's chambers and valves are working. This procedure takes approximately one hour. There are no restrictions for this procedure.   Follow-Up:  Please follow up with Dr Stanford Breed within the next 4 weeks.   Your physician wants you to follow-up in: One Year with Dr Gari Crown will receive a reminder letter in the mail two months in advance. If you don't receive a letter, please call our office to schedule the follow-up appointment.  Remote monitoring is used to monitor your Pacemaker of ICD from home. This monitoring reduces the number of office visits required to check your device to one time per year. It allows [place] to keep an eye on the functioning of your device to ensure it is working properly. You are scheduled for a device check from home on 12/20/2017. You may send your transmission at any time that day. If you have a wireless device, the transmission will be sent automatically. After your physician reviews your transmission, you will receive a postcard with your next transmission date.    Any Other Special Instructions Will Be Listed Below (If Applicable).     If you need a refill on your cardiac medications before your next appointment, please call your pharmacy.

## 2017-09-22 DIAGNOSIS — E782 Mixed hyperlipidemia: Secondary | ICD-10-CM | POA: Diagnosis not present

## 2017-09-22 DIAGNOSIS — J441 Chronic obstructive pulmonary disease with (acute) exacerbation: Secondary | ICD-10-CM | POA: Diagnosis not present

## 2017-09-22 DIAGNOSIS — I10 Essential (primary) hypertension: Secondary | ICD-10-CM | POA: Diagnosis not present

## 2017-09-22 DIAGNOSIS — C3490 Malignant neoplasm of unspecified part of unspecified bronchus or lung: Secondary | ICD-10-CM | POA: Diagnosis not present

## 2017-09-22 DIAGNOSIS — E118 Type 2 diabetes mellitus with unspecified complications: Secondary | ICD-10-CM | POA: Diagnosis not present

## 2017-09-22 DIAGNOSIS — Z125 Encounter for screening for malignant neoplasm of prostate: Secondary | ICD-10-CM | POA: Diagnosis not present

## 2017-09-28 ENCOUNTER — Ambulatory Visit (HOSPITAL_COMMUNITY): Payer: Medicare HMO | Attending: Internal Medicine

## 2017-09-28 ENCOUNTER — Other Ambulatory Visit: Payer: Self-pay

## 2017-09-28 DIAGNOSIS — Z8611 Personal history of tuberculosis: Secondary | ICD-10-CM | POA: Insufficient documentation

## 2017-09-28 DIAGNOSIS — Z95810 Presence of automatic (implantable) cardiac defibrillator: Secondary | ICD-10-CM | POA: Diagnosis not present

## 2017-09-28 DIAGNOSIS — J449 Chronic obstructive pulmonary disease, unspecified: Secondary | ICD-10-CM | POA: Diagnosis not present

## 2017-09-28 DIAGNOSIS — I509 Heart failure, unspecified: Secondary | ICD-10-CM | POA: Diagnosis not present

## 2017-09-28 DIAGNOSIS — I272 Pulmonary hypertension, unspecified: Secondary | ICD-10-CM | POA: Diagnosis not present

## 2017-09-28 DIAGNOSIS — C3490 Malignant neoplasm of unspecified part of unspecified bronchus or lung: Secondary | ICD-10-CM | POA: Insufficient documentation

## 2017-09-28 DIAGNOSIS — I255 Ischemic cardiomyopathy: Secondary | ICD-10-CM | POA: Diagnosis not present

## 2017-09-28 DIAGNOSIS — Z720 Tobacco use: Secondary | ICD-10-CM | POA: Diagnosis not present

## 2017-09-28 DIAGNOSIS — D649 Anemia, unspecified: Secondary | ICD-10-CM | POA: Diagnosis not present

## 2017-09-28 DIAGNOSIS — I340 Nonrheumatic mitral (valve) insufficiency: Secondary | ICD-10-CM | POA: Insufficient documentation

## 2017-09-28 DIAGNOSIS — I2510 Atherosclerotic heart disease of native coronary artery without angina pectoris: Secondary | ICD-10-CM | POA: Insufficient documentation

## 2017-09-28 MED ORDER — PERFLUTREN LIPID MICROSPHERE
1.0000 mL | INTRAVENOUS | Status: AC | PRN
  Administered 2017-09-28: 2 mL via INTRAVENOUS

## 2017-10-03 ENCOUNTER — Other Ambulatory Visit: Payer: Medicare HMO

## 2017-10-03 DIAGNOSIS — I255 Ischemic cardiomyopathy: Secondary | ICD-10-CM

## 2017-10-03 DIAGNOSIS — Z95810 Presence of automatic (implantable) cardiac defibrillator: Secondary | ICD-10-CM

## 2017-10-03 LAB — BASIC METABOLIC PANEL
BUN / CREAT RATIO: 10 (ref 9–20)
BUN: 8 mg/dL (ref 6–24)
CHLORIDE: 103 mmol/L (ref 96–106)
CO2: 18 mmol/L — ABNORMAL LOW (ref 20–29)
Calcium: 9.2 mg/dL (ref 8.7–10.2)
Creatinine, Ser: 0.82 mg/dL (ref 0.76–1.27)
GFR calc non Af Amer: 100 mL/min/{1.73_m2} (ref >59)
GFR, EST AFRICAN AMERICAN: 115 mL/min/{1.73_m2} (ref >59)
Glucose: 150 mg/dL — ABNORMAL HIGH (ref 65–99)
POTASSIUM: 4.8 mmol/L (ref 3.5–5.2)
Sodium: 139 mmol/L (ref 134–144)

## 2017-10-06 DIAGNOSIS — J441 Chronic obstructive pulmonary disease with (acute) exacerbation: Secondary | ICD-10-CM | POA: Diagnosis not present

## 2017-10-06 DIAGNOSIS — I10 Essential (primary) hypertension: Secondary | ICD-10-CM | POA: Diagnosis not present

## 2017-10-06 DIAGNOSIS — C3490 Malignant neoplasm of unspecified part of unspecified bronchus or lung: Secondary | ICD-10-CM | POA: Diagnosis not present

## 2017-10-09 ENCOUNTER — Telehealth: Payer: Self-pay | Admitting: Internal Medicine

## 2017-10-09 MED ORDER — SACUBITRIL-VALSARTAN 49-51 MG PO TABS: 1.0000 | ORAL_TABLET | Freq: Two times a day (BID) | ORAL | 0 refills | Status: DC

## 2017-10-09 NOTE — Telephone Encounter (Signed)
Pt will be in tomorrow 5/14 to pick up new dosage of Entresto 49/51. He is to follow up with the pharmacist for further titration.

## 2017-10-09 NOTE — Telephone Encounter (Signed)
Follow up    Patient returning call from nurse

## 2017-10-09 NOTE — Telephone Encounter (Signed)
New Message:      Pt is calling to see if he can get some more samples or a prescription of Entresto

## 2017-10-10 NOTE — Telephone Encounter (Signed)
Pt scheduled in pharmacy clinic for uptitration of Entresto

## 2017-10-15 DIAGNOSIS — I5040 Unspecified combined systolic (congestive) and diastolic (congestive) heart failure: Secondary | ICD-10-CM | POA: Diagnosis not present

## 2017-10-15 DIAGNOSIS — J438 Other emphysema: Secondary | ICD-10-CM | POA: Diagnosis not present

## 2017-10-15 DIAGNOSIS — J8410 Pulmonary fibrosis, unspecified: Secondary | ICD-10-CM | POA: Diagnosis not present

## 2017-10-15 DIAGNOSIS — J449 Chronic obstructive pulmonary disease, unspecified: Secondary | ICD-10-CM | POA: Diagnosis not present

## 2017-10-15 DIAGNOSIS — I5022 Chronic systolic (congestive) heart failure: Secondary | ICD-10-CM | POA: Diagnosis not present

## 2017-10-23 ENCOUNTER — Encounter (HOSPITAL_COMMUNITY): Payer: Self-pay | Admitting: *Deleted

## 2017-10-23 ENCOUNTER — Other Ambulatory Visit: Payer: Self-pay

## 2017-10-23 ENCOUNTER — Emergency Department (HOSPITAL_COMMUNITY): Payer: Medicare HMO

## 2017-10-23 ENCOUNTER — Inpatient Hospital Stay (HOSPITAL_COMMUNITY)
Admission: EM | Admit: 2017-10-23 | Discharge: 2017-10-26 | DRG: 313 | Disposition: A | Discharge status: Home / Self Care | Payer: Medicare HMO | Attending: Internal Medicine | Admitting: Internal Medicine

## 2017-10-23 DIAGNOSIS — I5043 Acute on chronic combined systolic (congestive) and diastolic (congestive) heart failure: Secondary | ICD-10-CM | POA: Diagnosis present

## 2017-10-23 DIAGNOSIS — I952 Hypotension due to drugs: Secondary | ICD-10-CM | POA: Diagnosis present

## 2017-10-23 DIAGNOSIS — Z9981 Dependence on supplemental oxygen: Secondary | ICD-10-CM

## 2017-10-23 DIAGNOSIS — E860 Dehydration: Secondary | ICD-10-CM | POA: Diagnosis present

## 2017-10-23 DIAGNOSIS — I5042 Chronic combined systolic (congestive) and diastolic (congestive) heart failure: Secondary | ICD-10-CM | POA: Diagnosis not present

## 2017-10-23 DIAGNOSIS — R0789 Other chest pain: Secondary | ICD-10-CM | POA: Diagnosis not present

## 2017-10-23 DIAGNOSIS — I959 Hypotension, unspecified: Secondary | ICD-10-CM | POA: Diagnosis present

## 2017-10-23 DIAGNOSIS — Z95810 Presence of automatic (implantable) cardiac defibrillator: Secondary | ICD-10-CM | POA: Diagnosis not present

## 2017-10-23 DIAGNOSIS — C3431 Malignant neoplasm of lower lobe, right bronchus or lung: Secondary | ICD-10-CM | POA: Diagnosis not present

## 2017-10-23 DIAGNOSIS — Z8249 Family history of ischemic heart disease and other diseases of the circulatory system: Secondary | ICD-10-CM

## 2017-10-23 DIAGNOSIS — N179 Acute kidney failure, unspecified: Secondary | ICD-10-CM | POA: Diagnosis present

## 2017-10-23 DIAGNOSIS — I428 Other cardiomyopathies: Secondary | ICD-10-CM | POA: Diagnosis not present

## 2017-10-23 DIAGNOSIS — Z8611 Personal history of tuberculosis: Secondary | ICD-10-CM

## 2017-10-23 DIAGNOSIS — E785 Hyperlipidemia, unspecified: Secondary | ICD-10-CM | POA: Diagnosis present

## 2017-10-23 DIAGNOSIS — J449 Chronic obstructive pulmonary disease, unspecified: Secondary | ICD-10-CM | POA: Diagnosis present

## 2017-10-23 DIAGNOSIS — Z7982 Long term (current) use of aspirin: Secondary | ICD-10-CM | POA: Diagnosis not present

## 2017-10-23 DIAGNOSIS — R0602 Shortness of breath: Secondary | ICD-10-CM | POA: Diagnosis not present

## 2017-10-23 DIAGNOSIS — F17210 Nicotine dependence, cigarettes, uncomplicated: Secondary | ICD-10-CM | POA: Diagnosis present

## 2017-10-23 DIAGNOSIS — C3490 Malignant neoplasm of unspecified part of unspecified bronchus or lung: Secondary | ICD-10-CM | POA: Diagnosis present

## 2017-10-23 DIAGNOSIS — I2510 Atherosclerotic heart disease of native coronary artery without angina pectoris: Secondary | ICD-10-CM | POA: Diagnosis present

## 2017-10-23 DIAGNOSIS — J441 Chronic obstructive pulmonary disease with (acute) exacerbation: Secondary | ICD-10-CM | POA: Diagnosis not present

## 2017-10-23 DIAGNOSIS — I252 Old myocardial infarction: Secondary | ICD-10-CM | POA: Diagnosis not present

## 2017-10-23 DIAGNOSIS — Z955 Presence of coronary angioplasty implant and graft: Secondary | ICD-10-CM | POA: Diagnosis not present

## 2017-10-23 DIAGNOSIS — I255 Ischemic cardiomyopathy: Secondary | ICD-10-CM | POA: Diagnosis present

## 2017-10-23 DIAGNOSIS — R05 Cough: Secondary | ICD-10-CM | POA: Diagnosis not present

## 2017-10-23 DIAGNOSIS — Z951 Presence of aortocoronary bypass graft: Secondary | ICD-10-CM | POA: Diagnosis not present

## 2017-10-23 DIAGNOSIS — J431 Panlobular emphysema: Secondary | ICD-10-CM | POA: Diagnosis not present

## 2017-10-23 DIAGNOSIS — Z7951 Long term (current) use of inhaled steroids: Secondary | ICD-10-CM | POA: Diagnosis not present

## 2017-10-23 DIAGNOSIS — Z720 Tobacco use: Secondary | ICD-10-CM | POA: Diagnosis not present

## 2017-10-23 DIAGNOSIS — R079 Chest pain, unspecified: Secondary | ICD-10-CM | POA: Diagnosis not present

## 2017-10-23 DIAGNOSIS — Z923 Personal history of irradiation: Secondary | ICD-10-CM

## 2017-10-23 DIAGNOSIS — C3491 Malignant neoplasm of unspecified part of right bronchus or lung: Secondary | ICD-10-CM | POA: Diagnosis not present

## 2017-10-23 DIAGNOSIS — Z79899 Other long term (current) drug therapy: Secondary | ICD-10-CM | POA: Diagnosis not present

## 2017-10-23 DIAGNOSIS — J9610 Chronic respiratory failure, unspecified whether with hypoxia or hypercapnia: Secondary | ICD-10-CM | POA: Diagnosis not present

## 2017-10-23 LAB — CBC
HCT: 48.5 % (ref 39.0–52.0)
HEMOGLOBIN: 16.2 g/dL (ref 13.0–17.0)
MCH: 27.5 pg (ref 26.0–34.0)
MCHC: 33.4 g/dL (ref 30.0–36.0)
MCV: 82.2 fL (ref 78.0–100.0)
Platelets: 420 10*3/uL — ABNORMAL HIGH (ref 150–400)
RBC: 5.9 MIL/uL — ABNORMAL HIGH (ref 4.22–5.81)
RDW: 13.9 % (ref 11.5–15.5)
WBC: 8.8 10*3/uL (ref 4.0–10.5)

## 2017-10-23 LAB — BRAIN NATRIURETIC PEPTIDE: B Natriuretic Peptide: 307.5 pg/mL — ABNORMAL HIGH (ref 0.0–100.0)

## 2017-10-23 LAB — I-STAT TROPONIN, ED: TROPONIN I, POC: 0.01 ng/mL (ref 0.00–0.08)

## 2017-10-23 LAB — BASIC METABOLIC PANEL
ANION GAP: 10 (ref 5–15)
BUN: 42 mg/dL — ABNORMAL HIGH (ref 6–20)
CALCIUM: 9.7 mg/dL (ref 8.9–10.3)
CO2: 19 mmol/L — ABNORMAL LOW (ref 22–32)
Chloride: 106 mmol/L (ref 101–111)
Creatinine, Ser: 2.62 mg/dL — ABNORMAL HIGH (ref 0.61–1.24)
GFR calc Af Amer: 30 mL/min — ABNORMAL LOW (ref >60)
GFR, EST NON AFRICAN AMERICAN: 26 mL/min — AB (ref >60)
GLUCOSE: 102 mg/dL — AB (ref 65–99)
Potassium: 4.6 mmol/L (ref 3.5–5.1)
Sodium: 135 mmol/L (ref 135–145)

## 2017-10-23 MED ORDER — SODIUM CHLORIDE 0.9 % IV BOLUS
500.0000 mL | Freq: Once | INTRAVENOUS | Status: AC
  Administered 2017-10-23: 500 mL via INTRAVENOUS

## 2017-10-23 MED ORDER — IPRATROPIUM BROMIDE HFA 17 MCG/ACT IN AERS: 2.0000 | INHALATION_SPRAY | Freq: Once | RESPIRATORY_TRACT | Status: DC

## 2017-10-23 MED ORDER — PREDNISONE 20 MG PO TABS: 20.0000 mg | ORAL_TABLET | Freq: Two times a day (BID) | ORAL | 0 refills | Status: DC

## 2017-10-23 MED ORDER — IPRATROPIUM BROMIDE 0.02 % IN SOLN
0.5000 mg | Freq: Once | RESPIRATORY_TRACT | Status: AC
  Administered 2017-10-23: 0.5 mg via RESPIRATORY_TRACT
  Filled 2017-10-23: qty 2.5

## 2017-10-23 MED ORDER — METHYLPREDNISOLONE SODIUM SUCC 125 MG IJ SOLR
125.0000 mg | Freq: Once | INTRAMUSCULAR | Status: AC
  Administered 2017-10-23: 125 mg via INTRAVENOUS
  Filled 2017-10-23: qty 2

## 2017-10-23 MED ORDER — SODIUM CHLORIDE 0.9 % IV BOLUS: 500.0000 mL | Freq: Once | INTRAVENOUS | Status: DC

## 2017-10-23 MED ORDER — ALBUTEROL SULFATE (2.5 MG/3ML) 0.083% IN NEBU
5.0000 mg | INHALATION_SOLUTION | Freq: Once | RESPIRATORY_TRACT | Status: AC
  Administered 2017-10-23: 5 mg via RESPIRATORY_TRACT
  Filled 2017-10-23: qty 6

## 2017-10-23 NOTE — ED Notes (Signed)
Spoke with Synetta Shadow regarding [employer]  pacemaker interrogation report. There are no episodes recorded. Notified Dr. Jeneen Rinks.

## 2017-10-23 NOTE — ED Notes (Signed)
Pt resting on stretcher with eyes closed, RR even and unlabored, NAD. Pt currently on 2L by [place].

## 2017-10-23 NOTE — Discharge Instructions (Addendum)
Do not take your Entresto.  Call Dr. Olin Pia office tomorrow for further instructions regarding resuming or change in dosing.  Your symptoms tonight were secondary to flare of your COPD.  Continue your inhaler at home as needed.  Prescription for prednisone x5 days.   Chronic Obstructive Pulmonary Disease Chronic obstructive pulmonary disease (COPD) is a long-term (chronic) lung problem. When you have COPD, it is hard for air to get in and out of your lungs. The way your lungs work will never return to normal. Usually the condition gets worse over time. There are things you can do to keep yourself as healthy as possible. Your doctor may treat your condition with:  Medicines.  Quitting smoking, if you smoke.  Rehabilitation. This may involve a team of specialists.  Oxygen.  Exercise and changes to your diet.  Lung surgery.  Comfort measures (palliative care).  Follow these instructions at home: Medicines  Take over-the-counter and prescription medicines only as told by your doctor.  Talk to your doctor before taking any cough or allergy medicines. You may need to avoid medicines that cause your lungs to be dry. Lifestyle  If you smoke, stop. Smoking makes the problem worse. If you need help quitting, ask your doctor.  Avoid being around things that make your breathing worse. This may include smoke, chemicals, and fumes.  Stay active, but remember to also rest.  Learn and use tips on how to relax.  Make sure you get enough sleep. Most adults need at least 7 hours a night.  Eat healthy foods. Eat smaller meals more often. Rest before meals. Controlled breathing  Learn and use tips on how to control your breathing as told by your doctor. Try: ? Breathing in (inhaling) through your nose for 1 second. Then, pucker your lips and breath out (exhale) through your lips for 2 seconds. ? Putting one hand on your belly (abdomen). Breathe in slowly through your nose for 1 second. Your  hand on your belly should move out. Pucker your lips and breathe out slowly through your lips. Your hand on your belly should move in as you breathe out. Controlled coughing  Learn and use controlled coughing to clear mucus from your lungs. The steps are: 1. Lean your head a little forward. 2. Breathe in deeply. 3. Try to hold your breath for 3 seconds. 4. Keep your mouth slightly open while coughing 2 times. 5. Spit any mucus out into a tissue. 6. Rest and do the steps again 1 or 2 times as needed. General instructions  Make sure you get all the shots (vaccines) that your doctor recommends. Ask your doctor about a flu shot and a pneumonia shot.  Use oxygen therapy and therapy to help improve your lungs (pulmonary rehabilitation) if told by your doctor. If you need home oxygen therapy, ask your doctor if you should buy a tool to measure your oxygen level (oximeter).  Make a COPD action plan with your doctor. This helps you know what to do if you feel worse than usual.  Manage any other conditions you have as told by your doctor.  Avoid going outside when it is very hot, cold, or humid.  Avoid people who have a sickness you can catch (contagious).  Keep all follow-up visits as told by your doctor. This is important. Contact a doctor if:  You cough up more mucus than usual.  There is a change in the color or thickness of the mucus.  It is harder to breathe  than usual.  Your breathing is faster than usual.  You have trouble sleeping.  You need to use your medicines more often than usual.  You have trouble doing your normal activities such as getting dressed or walking around the house. Get help right away if:  You have shortness of breath while resting.  You have shortness of breath that stops you from: ? Being able to talk. ? Doing normal activities.  Your chest hurts for longer than 5 minutes.  Your skin color is more blue than usual.  Your pulse oximeter shows that  you have low oxygen for longer than 5 minutes.  You have a fever.  You feel too tired to breathe normally. Summary  Chronic obstructive pulmonary disease (COPD) is a long-term lung problem.  The way your lungs work will never return to normal. Usually the condition gets worse over time. There are things you can do to keep yourself as healthy as possible.  Take over-the-counter and prescription medicines only as told by your doctor.  If you smoke, stop. Smoking makes the problem worse. This information is not intended to replace advice given to you by your health care provider. Make sure you discuss any questions you have with your health care provider. Document Released: 11/02/2007 Document Revised: 10/22/2015 Document Reviewed: 01/10/2013 Elsevier Interactive Patient Education  2017 [employer].    Low-Sodium Eating Plan Sodium, which is an element that makes up salt, helps you maintain a healthy balance of fluids in your body. Too much sodium can increase your blood pressure and cause fluid and waste to be held in your body. Your health care provider or dietitian may recommend following this plan if you have high blood pressure (hypertension), kidney disease, liver disease, or heart failure. Eating less sodium can help lower your blood pressure, reduce swelling, and protect your heart, liver, and kidneys. What are tips for following this plan? General guidelines  Most people on this plan should limit their sodium intake to 1,500-2,000 mg (milligrams) of sodium each day. Reading food labels  The Nutrition Facts label lists the amount of sodium in one serving of the food. If you eat more than one serving, you must multiply the listed amount of sodium by the number of servings.  Choose foods with less than 140 mg of sodium per serving.  Avoid foods with 300 mg of sodium or more per serving. Shopping  Look for lower-sodium products, often labeled as "low-sodium" or "no salt  added."  Always check the sodium content even if foods are labeled as "unsalted" or "no salt added".  Buy fresh foods. ? Avoid canned foods and premade or frozen meals. ? Avoid canned, cured, or processed meats  Buy breads that have less than 80 mg of sodium per slice. Cooking  Eat more home-cooked food and less restaurant, buffet, and fast food.  Avoid adding salt when cooking. Use salt-free seasonings or herbs instead of table salt or sea salt. Check with your health care provider or pharmacist before using salt substitutes.  Cook with plant-based oils, such as canola, sunflower, or olive oil. Meal planning  When eating at a restaurant, ask that your food be prepared with less salt or no salt, if possible.  Avoid foods that contain MSG (monosodium glutamate). MSG is sometimes added to [place] food, bouillon, and some canned foods. What foods are recommended? The items listed may not be a complete list. Talk with your dietitian about what dietary choices are best for you. Grains Low-sodium  cereals, including oats, puffed wheat and rice, and shredded wheat. Low-sodium crackers. Unsalted rice. Unsalted pasta. Low-sodium bread. Whole-grain breads and whole-grain pasta. Vegetables Fresh or frozen vegetables. "No salt added" canned vegetables. "No salt added" tomato sauce and paste. Low-sodium or reduced-sodium tomato and vegetable juice. Fruits Fresh, frozen, or canned fruit. Fruit juice. Meats and other protein foods Fresh or frozen (no salt added) meat, poultry, seafood, and fish. Low-sodium canned tuna and salmon. Unsalted nuts. Dried peas, beans, and lentils without added salt. Unsalted canned beans. Eggs. Unsalted nut butters. Dairy Milk. Soy milk. Cheese that is naturally low in sodium, such as ricotta cheese, fresh mozzarella, or Swiss cheese Low-sodium or reduced-sodium cheese. Cream cheese. Yogurt. Fats and oils Unsalted butter. Unsalted margarine with no trans fat. Vegetable  oils such as canola or olive oils. Seasonings and other foods Fresh and dried herbs and spices. Salt-free seasonings. Low-sodium mustard and ketchup. Sodium-free salad dressing. Sodium-free light mayonnaise. Fresh or refrigerated horseradish. Lemon juice. Vinegar. Homemade, reduced-sodium, or low-sodium soups. Unsalted popcorn and pretzels. Low-salt or salt-free chips. What foods are not recommended? The items listed may not be a complete list. Talk with your dietitian about what dietary choices are best for you. Grains Instant hot cereals. Bread stuffing, pancake, and biscuit mixes. Croutons. Seasoned rice or pasta mixes. Noodle soup cups. Boxed or frozen macaroni and cheese. Regular salted crackers. Self-rising flour. Vegetables Sauerkraut, pickled vegetables, and relishes. Olives. [place] fries. Onion rings. Regular canned vegetables (not low-sodium or reduced-sodium). Regular canned tomato sauce and paste (not low-sodium or reduced-sodium). Regular tomato and vegetable juice (not low-sodium or reduced-sodium). Frozen vegetables in sauces. Meats and other protein foods Meat or fish that is salted, canned, smoked, spiced, or pickled. Bacon, ham, sausage, hotdogs, corned beef, chipped beef, packaged lunch meats, salt pork, jerky, pickled herring, anchovies, regular canned tuna, sardines, salted nuts. Dairy Processed cheese and cheese spreads. Cheese curds. Blue cheese. Feta cheese. String cheese. Regular cottage cheese. Buttermilk. Canned milk. Fats and oils Salted butter. Regular margarine. Ghee. Bacon fat. Seasonings and other foods Onion salt, garlic salt, seasoned salt, table salt, and sea salt. Canned and packaged gravies. Worcestershire sauce. Tartar sauce. Barbecue sauce. Teriyaki sauce. Soy sauce, including reduced-sodium. Steak sauce. Fish sauce. Oyster sauce. Cocktail sauce. Horseradish that you find on the shelf. Regular ketchup and mustard. Meat flavorings and tenderizers. Bouillon cubes.  Hot sauce and Tabasco sauce. Premade or packaged marinades. Premade or packaged taco seasonings. Relishes. Regular salad dressings. Salsa. Potato and tortilla chips. Corn chips and puffs. Salted popcorn and pretzels. Canned or dried soups. Pizza. Frozen entrees and pot pies. Summary  Eating less sodium can help lower your blood pressure, reduce swelling, and protect your heart, liver, and kidneys.  Most people on this plan should limit their sodium intake to 1,500-2,000 mg (milligrams) of sodium each day.  Canned, boxed, and frozen foods are high in sodium. Restaurant foods, fast foods, and pizza are also very high in sodium. You also get sodium by adding salt to food.  Try to cook at home, eat more fresh fruits and vegetables, and eat less fast food, canned, processed, or prepared foods. This information is not intended to replace advice given to you by your health care provider. Make sure you discuss any questions you have with your health care provider. Document Released: 11/05/2001 Document Revised: 05/09/2016 Document Reviewed: 05/09/2016 Elsevier Interactive Patient Education  [employer].
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
## 2017-10-23 NOTE — ED Triage Notes (Addendum)
Pt has been having chest pain and sob for the past week with nonproductive cough. Pt unable to describe the pain stating "It varies from each time the pain starts." pt noted to be hypotensive in triage after bp was checked. Reports his bp runs around 544 systolic, has recently started taking entresto. Has dizziness at times but denies at present
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
## 2017-10-24 ENCOUNTER — Encounter (HOSPITAL_COMMUNITY): Payer: Self-pay | Admitting: General Practice

## 2017-10-24 ENCOUNTER — Other Ambulatory Visit: Payer: Self-pay

## 2017-10-24 DIAGNOSIS — E785 Hyperlipidemia, unspecified: Secondary | ICD-10-CM | POA: Diagnosis present

## 2017-10-24 DIAGNOSIS — J441 Chronic obstructive pulmonary disease with (acute) exacerbation: Secondary | ICD-10-CM | POA: Diagnosis present

## 2017-10-24 DIAGNOSIS — Z8611 Personal history of tuberculosis: Secondary | ICD-10-CM | POA: Diagnosis not present

## 2017-10-24 DIAGNOSIS — I2510 Atherosclerotic heart disease of native coronary artery without angina pectoris: Secondary | ICD-10-CM | POA: Diagnosis present

## 2017-10-24 DIAGNOSIS — C3431 Malignant neoplasm of lower lobe, right bronchus or lung: Secondary | ICD-10-CM | POA: Diagnosis present

## 2017-10-24 DIAGNOSIS — I5042 Chronic combined systolic (congestive) and diastolic (congestive) heart failure: Secondary | ICD-10-CM | POA: Diagnosis present

## 2017-10-24 DIAGNOSIS — J431 Panlobular emphysema: Secondary | ICD-10-CM | POA: Diagnosis not present

## 2017-10-24 DIAGNOSIS — C3491 Malignant neoplasm of unspecified part of right bronchus or lung: Secondary | ICD-10-CM | POA: Diagnosis not present

## 2017-10-24 DIAGNOSIS — J9610 Chronic respiratory failure, unspecified whether with hypoxia or hypercapnia: Secondary | ICD-10-CM | POA: Diagnosis present

## 2017-10-24 DIAGNOSIS — R079 Chest pain, unspecified: Principal | ICD-10-CM

## 2017-10-24 DIAGNOSIS — I959 Hypotension, unspecified: Secondary | ICD-10-CM | POA: Diagnosis present

## 2017-10-24 DIAGNOSIS — N179 Acute kidney failure, unspecified: Secondary | ICD-10-CM | POA: Diagnosis present

## 2017-10-24 DIAGNOSIS — E860 Dehydration: Secondary | ICD-10-CM | POA: Diagnosis present

## 2017-10-24 DIAGNOSIS — Z8249 Family history of ischemic heart disease and other diseases of the circulatory system: Secondary | ICD-10-CM | POA: Diagnosis not present

## 2017-10-24 DIAGNOSIS — R0789 Other chest pain: Secondary | ICD-10-CM | POA: Diagnosis not present

## 2017-10-24 DIAGNOSIS — F17210 Nicotine dependence, cigarettes, uncomplicated: Secondary | ICD-10-CM | POA: Diagnosis present

## 2017-10-24 DIAGNOSIS — Z923 Personal history of irradiation: Secondary | ICD-10-CM | POA: Diagnosis not present

## 2017-10-24 DIAGNOSIS — Z7951 Long term (current) use of inhaled steroids: Secondary | ICD-10-CM | POA: Diagnosis not present

## 2017-10-24 DIAGNOSIS — Z951 Presence of aortocoronary bypass graft: Secondary | ICD-10-CM | POA: Diagnosis not present

## 2017-10-24 DIAGNOSIS — I428 Other cardiomyopathies: Secondary | ICD-10-CM | POA: Diagnosis not present

## 2017-10-24 DIAGNOSIS — I952 Hypotension due to drugs: Secondary | ICD-10-CM | POA: Diagnosis present

## 2017-10-24 DIAGNOSIS — Z79899 Other long term (current) drug therapy: Secondary | ICD-10-CM | POA: Diagnosis not present

## 2017-10-24 DIAGNOSIS — Z720 Tobacco use: Secondary | ICD-10-CM | POA: Diagnosis not present

## 2017-10-24 DIAGNOSIS — Z7982 Long term (current) use of aspirin: Secondary | ICD-10-CM | POA: Diagnosis not present

## 2017-10-24 DIAGNOSIS — Z955 Presence of coronary angioplasty implant and graft: Secondary | ICD-10-CM | POA: Diagnosis not present

## 2017-10-24 DIAGNOSIS — I252 Old myocardial infarction: Secondary | ICD-10-CM | POA: Diagnosis not present

## 2017-10-24 DIAGNOSIS — Z95810 Presence of automatic (implantable) cardiac defibrillator: Secondary | ICD-10-CM | POA: Diagnosis not present

## 2017-10-24 DIAGNOSIS — I255 Ischemic cardiomyopathy: Secondary | ICD-10-CM | POA: Diagnosis present

## 2017-10-24 DIAGNOSIS — Z9981 Dependence on supplemental oxygen: Secondary | ICD-10-CM | POA: Diagnosis not present

## 2017-10-24 LAB — LIPID PANEL
CHOL/HDL RATIO: 6.6 ratio
CHOLESTEROL: 99 mg/dL (ref 0–200)
HDL: 15 mg/dL — ABNORMAL LOW (ref >40)
LDL Cholesterol: 73 mg/dL (ref 0–99)
Triglycerides: 56 mg/dL (ref <150)
VLDL: 11 mg/dL (ref 0–40)

## 2017-10-24 LAB — RAPID URINE DRUG SCREEN, HOSP PERFORMED
Amphetamines: NOT DETECTED
Barbiturates: NOT DETECTED
Benzodiazepines: NOT DETECTED
COCAINE: NOT DETECTED
OPIATES: NOT DETECTED
TETRAHYDROCANNABINOL: POSITIVE — AB

## 2017-10-24 LAB — HEMOGLOBIN A1C
Hgb A1c MFr Bld: 5.5 % (ref 4.8–5.6)
MEAN PLASMA GLUCOSE: 111.15 mg/dL

## 2017-10-24 LAB — LACTIC ACID, PLASMA: Lactic Acid, Venous: 1 mmol/L (ref 0.5–1.9)

## 2017-10-24 LAB — I-STAT TROPONIN, ED: Troponin i, poc: 0.03 ng/mL (ref 0.00–0.08)

## 2017-10-24 LAB — TROPONIN I
Troponin I: <0.03 ng/mL (ref <0.03)
Troponin I: 0.03 ng/mL (ref <0.03)

## 2017-10-24 LAB — MRSA PCR SCREENING: MRSA by PCR: NEGATIVE

## 2017-10-24 LAB — CREATININE, URINE, RANDOM: Creatinine, Urine: 234.85 mg/dL

## 2017-10-24 MED ORDER — ASPIRIN EC 81 MG PO TBEC
81.0000 mg | DELAYED_RELEASE_TABLET | Freq: Every day | ORAL | Status: DC
  Administered 2017-10-24 – 2017-10-26 (×3): 81 mg via ORAL
  Filled 2017-10-24 (×3): qty 1

## 2017-10-24 MED ORDER — IPRATROPIUM BROMIDE 0.02 % IN SOLN
0.5000 mg | Freq: Three times a day (TID) | RESPIRATORY_TRACT | Status: DC
  Administered 2017-10-24 – 2017-10-25 (×2): 0.5 mg via RESPIRATORY_TRACT
  Filled 2017-10-24 (×3): qty 2.5

## 2017-10-24 MED ORDER — ENOXAPARIN SODIUM 30 MG/0.3ML ~~LOC~~ SOLN
30.0000 mg | Freq: Every day | SUBCUTANEOUS | Status: DC
  Administered 2017-10-24 – 2017-10-25 (×2): 30 mg via SUBCUTANEOUS
  Filled 2017-10-24 (×2): qty 0.3

## 2017-10-24 MED ORDER — IPRATROPIUM BROMIDE 0.02 % IN SOLN
0.5000 mg | Freq: Four times a day (QID) | RESPIRATORY_TRACT | Status: DC
  Administered 2017-10-24 (×2): 0.5 mg via RESPIRATORY_TRACT
  Filled 2017-10-24 (×2): qty 2.5

## 2017-10-24 MED ORDER — ZOLPIDEM TARTRATE 5 MG PO TABS: 5.0000 mg | ORAL_TABLET | Freq: Every evening | ORAL | Status: DC | PRN

## 2017-10-24 MED ORDER — ACETAMINOPHEN 325 MG PO TABS: 650.0000 mg | ORAL_TABLET | ORAL | Status: DC | PRN

## 2017-10-24 MED ORDER — MORPHINE SULFATE (PF) 4 MG/ML IV SOLN: 1.0000 mg | INTRAVENOUS | Status: DC | PRN

## 2017-10-24 MED ORDER — LEVALBUTEROL HCL 1.25 MG/0.5ML IN NEBU
1.2500 mg | INHALATION_SOLUTION | Freq: Four times a day (QID) | RESPIRATORY_TRACT | Status: DC
Start: 2017-10-24 — End: 2017-10-24
  Administered 2017-10-24: 1.25 mg via RESPIRATORY_TRACT
  Filled 2017-10-24 (×2): qty 0.5

## 2017-10-24 MED ORDER — DM-GUAIFENESIN ER 30-600 MG PO TB12
1.0000 | ORAL_TABLET | Freq: Two times a day (BID) | ORAL | Status: DC | PRN
  Administered 2017-10-24 – 2017-10-25 (×2): 1 via ORAL
  Filled 2017-10-24 (×2): qty 1

## 2017-10-24 MED ORDER — SODIUM CHLORIDE 0.9 % IV BOLUS
500.0000 mL | Freq: Once | INTRAVENOUS | Status: AC
  Administered 2017-10-24: 500 mL via INTRAVENOUS

## 2017-10-24 MED ORDER — LEVALBUTEROL HCL 1.25 MG/0.5ML IN NEBU
1.2500 mg | INHALATION_SOLUTION | Freq: Three times a day (TID) | RESPIRATORY_TRACT | Status: DC
  Administered 2017-10-24 – 2017-10-25 (×2): 1.25 mg via RESPIRATORY_TRACT
  Filled 2017-10-24 (×3): qty 0.5

## 2017-10-24 MED ORDER — MOMETASONE FURO-FORMOTEROL FUM 100-5 MCG/ACT IN AERO
2.0000 | INHALATION_SPRAY | Freq: Two times a day (BID) | RESPIRATORY_TRACT | Status: DC
  Administered 2017-10-24 – 2017-10-26 (×4): 2 via RESPIRATORY_TRACT
  Filled 2017-10-24: qty 8.8

## 2017-10-24 MED ORDER — LEVALBUTEROL HCL 1.25 MG/0.5ML IN NEBU
1.2500 mg | INHALATION_SOLUTION | Freq: Four times a day (QID) | RESPIRATORY_TRACT | Status: DC
  Administered 2017-10-24 (×2): 1.25 mg via RESPIRATORY_TRACT
  Filled 2017-10-24 (×4): qty 0.5

## 2017-10-24 MED ORDER — SODIUM CHLORIDE 0.9 % IV BOLUS
250.0000 mL | Freq: Once | INTRAVENOUS | Status: AC
  Administered 2017-10-24: 250 mL via INTRAVENOUS

## 2017-10-24 MED ORDER — IPRATROPIUM BROMIDE 0.02 % IN SOLN
0.5000 mg | RESPIRATORY_TRACT | Status: DC
  Administered 2017-10-24: 0.5 mg via RESPIRATORY_TRACT
  Filled 2017-10-24: qty 2.5

## 2017-10-24 MED ORDER — ONDANSETRON HCL 4 MG/2ML IJ SOLN: 4.0000 mg | Freq: Four times a day (QID) | INTRAMUSCULAR | Status: DC | PRN

## 2017-10-24 MED ORDER — OXYCODONE HCL 5 MG PO TABS: 5.0000 mg | ORAL_TABLET | ORAL | Status: DC | PRN

## 2017-10-24 NOTE — ED Notes (Signed)
Requested hospital bed (new, SDU kind)

## 2017-10-24 NOTE — ED Notes (Signed)
Transfer pt. To a hospital bed

## 2017-10-24 NOTE — Progress Notes (Signed)
Ashar Lewinski is a 57 y.o. male with medical history significant of CHF with EF 20%, hyperlipidemia, COPD, chronic respiratory failure on 3 L nasal cannula oxygen, AICD placement, CAD, CABG, stent placement, non-small cell lung cancer, resection of left upper lobe 2/15, recurrence 2017 with RTX, TB at who presents with chest pain.  10/24/2017: Patient seen and examined at his bedside.  His chest pain has resolved.  Persistent hypotension.  Also presented with AKI with no prior history of CKD.  Entresto and other cardiac medications are being held. Small boluses IV fluid normal saline as needed to maintain map above 65.  Please refer to H&P dictated by Dr. Blaine Hamper on 10/24/2017 for further details of the assessment and plan.

## 2017-10-24 NOTE — ED Notes (Signed)
Dr Niu at bedside 

## 2017-10-24 NOTE — Progress Notes (Signed)
Pt. O2 sat 87- 92% on RA- Pt requested supplemental O2 for sleep. Applied nasal canula @ 1L.

## 2017-10-24 NOTE — ED Notes (Signed)
Spoke with admitting MD, reports he can eat [place].
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
## 2017-10-24 NOTE — ED Notes (Signed)
REPAGED @ 105

## 2017-10-24 NOTE — Progress Notes (Signed)
    Cardiology consultation from earlier this morning reviewed.  Agree with holding Entresto mid dose as well as Lasix.  3 weeks ago, creatinine was normal.  Only new change is Entresto.  Agree with gentle administration of fluids to hopefully help with acute kidney injury.  Candee Furbish, MD

## 2017-10-24 NOTE — ED Notes (Signed)
Admitting MD at bedside.

## 2017-10-24 NOTE — ED Provider Notes (Signed)
D/w cardiology Dr. Georgette Shell.  She will evaluate patient and recommends hospitalist admission. D/w Dr. Blaine Hamper and Dr. Georgette Shell at bedside. Both agree with additional IVF for hypotension and AKI. Patient warm and well perfused. Low suspicion for cardiogenic shock.    Ezequiel Essex, MD 10/24/17 [phone]
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
## 2017-10-24 NOTE — ED Notes (Signed)
Paged [place] to [place]
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
## 2017-10-25 ENCOUNTER — Other Ambulatory Visit: Payer: Self-pay | Admitting: Medical

## 2017-10-25 ENCOUNTER — Inpatient Hospital Stay (HOSPITAL_COMMUNITY): Payer: Medicare HMO

## 2017-10-25 DIAGNOSIS — I5042 Chronic combined systolic (congestive) and diastolic (congestive) heart failure: Secondary | ICD-10-CM

## 2017-10-25 DIAGNOSIS — N179 Acute kidney failure, unspecified: Secondary | ICD-10-CM

## 2017-10-25 DIAGNOSIS — R079 Chest pain, unspecified: Secondary | ICD-10-CM

## 2017-10-25 DIAGNOSIS — J431 Panlobular emphysema: Secondary | ICD-10-CM

## 2017-10-25 DIAGNOSIS — Z720 Tobacco use: Secondary | ICD-10-CM

## 2017-10-25 LAB — CBC
HCT: 39.6 % (ref 39.0–52.0)
HEMOGLOBIN: 13.4 g/dL (ref 13.0–17.0)
MCH: 27.9 pg (ref 26.0–34.0)
MCHC: 33.8 g/dL (ref 30.0–36.0)
MCV: 82.3 fL (ref 78.0–100.0)
Platelets: 394 10*3/uL (ref 150–400)
RBC: 4.81 MIL/uL (ref 4.22–5.81)
RDW: 13.8 % (ref 11.5–15.5)
WBC: 8.7 10*3/uL (ref 4.0–10.5)

## 2017-10-25 LAB — ECHOCARDIOGRAM LIMITED
Height: 68 in
WEIGHTICAEL: 2127 [oz_av]

## 2017-10-25 LAB — UREA NITROGEN, URINE: Urea Nitrogen, Ur: 653 mg/dL

## 2017-10-25 LAB — BASIC METABOLIC PANEL
Anion gap: 7 (ref 5–15)
BUN: 22 mg/dL — AB (ref 6–20)
CALCIUM: 9.1 mg/dL (ref 8.9–10.3)
CHLORIDE: 108 mmol/L (ref 101–111)
CO2: 24 mmol/L (ref 22–32)
Creatinine, Ser: 0.82 mg/dL (ref 0.61–1.24)
GFR calc Af Amer: >60 mL/min (ref >60)
GFR calc non Af Amer: >60 mL/min (ref >60)
Glucose, Bld: 97 mg/dL (ref 65–99)
Potassium: 4.2 mmol/L (ref 3.5–5.1)
SODIUM: 139 mmol/L (ref 135–145)

## 2017-10-25 MED ORDER — SACUBITRIL-VALSARTAN 24-26 MG PO TABS
1.0000 | ORAL_TABLET | Freq: Two times a day (BID) | ORAL | Status: DC
  Administered 2017-10-25 – 2017-10-26 (×2): 1 via ORAL
  Filled 2017-10-25 (×4): qty 1

## 2017-10-25 MED ORDER — IPRATROPIUM-ALBUTEROL 0.5-2.5 (3) MG/3ML IN SOLN
3.0000 mL | Freq: Four times a day (QID) | RESPIRATORY_TRACT | Status: DC
  Administered 2017-10-25: 3 mL via RESPIRATORY_TRACT
  Filled 2017-10-25: qty 3

## 2017-10-25 MED ORDER — PERFLUTREN LIPID MICROSPHERE
1.0000 mL | INTRAVENOUS | Status: AC | PRN
  Administered 2017-10-25: 2 mL via INTRAVENOUS
  Filled 2017-10-25: qty 10

## 2017-10-25 MED ORDER — METHYLPREDNISOLONE SODIUM SUCC 125 MG IJ SOLR
60.0000 mg | INTRAMUSCULAR | Status: DC
  Administered 2017-10-25: 60 mg via INTRAVENOUS
  Filled 2017-10-25: qty 2

## 2017-10-25 MED ORDER — ENOXAPARIN SODIUM 40 MG/0.4ML ~~LOC~~ SOLN
40.0000 mg | Freq: Every day | SUBCUTANEOUS | Status: DC
  Filled 2017-10-25: qty 0.4

## 2017-10-25 NOTE — Progress Notes (Signed)
  Echocardiogram 2D Echocardiogram has been performed.  [name] L Androw 10/25/2017, 2:07 PM

## 2017-10-25 NOTE — Progress Notes (Signed)
PROGRESS NOTE    [name]  OZH:[id number] DOB: 11-10-1960 DOA: 10/23/2017 PCP: Lucianne Lei, MD   Brief Narrative:  57 y.o. BM PMHx STEMI, Chronic Systolic CHF (EF =62%), ischemic cardiomyopathy, AICD placement, CAD, CABG, stent placement, HLD,COPD, chronic respiratory failure on 3 L nasal cannula oxygen, , non-small cell lung cancer, resection of left upper lobe 2/15, recurrence 2017 with XRT, TB    Presents with chest pain.   He has been having intermittent chest pain in the past several days.  The chest pain is located in the substernal area, sometimes to the left side of chest. It can last from a few minutes to 30 minutes at a time. It is.  It is associated with the shortness of breath. He has dry cough, no fever or chills. Pt state that he was seen by Dr. Caryl Comes, his EP cardiologist April 25.  He was started back on Entresto. 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 He has dizzy and lightheadedness sometimes.  Patient denies nausea, vomiting, diarrhea, abdominal pain, symptoms of UTI or unilateral weakness.   ED Course: pt was found to have hypotension with blood pressure 71/53, which improved to 90/60 5:04 100 cc normal saline bolus in the ED, troponin negative, BNP 307.5, acute renal injury with creatinine 2.62, BUN 42, temperature normal, tachycardia, tachypnea, oxygen saturation 90 to 94% on 2 L nasal cannula oxygen. Pt admitted to stepdown as inpatient.    Subjective: 5/29 A/O x4, negative CP, positive S OB, negative abdominal pain.  Unknown dry weight.  Does not weigh himself daily.  On 2 L O2 via [place] at night.  States stopped smoking 1 week ago.    Assessment & Plan:   Principal Problem:   Chest pain Active Problems:   Coronary artery disease   Adenocarcinoma of lung, stage 1, 49mm RLL paraspinal mass   COPD (chronic obstructive pulmonary disease) (HCC)   Chronic combined systolic (congestive) and diastolic (congestive) heart failure (HCC)   AKI (acute kidney injury) (HCC)  Hypotension   Chest pain and CAD:  -S/P CABG and stent placement.  Currently chest pain-free -Cardiology Dr. Candee Furbish adjusting medication. - Echocardiogram pending - ASA 81 mg daily - Entresto 24-26 mg BID -Hold Lasix per cardiology -Follow-up with [facility] to see Dr. Adam Phenix and PA [facility]  on 6/5  @1530    Chronic combined systolic and diastolic CHF:  -2D echo on 09/28/2017 showed EF 20-25% with grade 2 diastolic dysfunction.  Patient does not have leg edema.  BNP 307.5. Clinically patient seems to be dry. -See chest pain   Large Cell Adenocarcinoma of lung, stage 1, 23mm RLL paraspinal mass -S/P Wedge resection RIGHT UPPER LOBE on 2/15 with recurrence 2/17 treated with XRT  -pt is followed by Dr. Curt Bears.  Per Dr. Worthy Flank note 3/12 patient is to follow-up in 6 months for repeat CT scan of chest   COPD  -Solu-Medrol 60 mg daily - DuoNeb QID - Dulera 100-5 mcg 2 puff BID -Flutter valve -Mucinex DM BID -Discussed at length absolute need to refrain from smoking  Tobacco abuse -See COPD   Hypotension -most likely secondary to poor EF and overdiuresis. -resolved   Acute kidney injury Recent Labs  Lab 10/23/17 2011 10/25/17 0640  CREATININE 2.62* 0.82  -Resolved   Abnormal chest x-ray - CXR shows left apical consolidation and volume loss with underlying cavitary component characterized on prior CT.  Review of oncology notes: Dr. [name] 12 patient on 08/08/2017 and reviewed all  pertinent radiology scans per his notes stable.  Patient to follow-up in 6 months for repeat CT scan.    DVT prophylaxis: Lovenox Code Status: Full Family Communication: None Disposition Plan: TBD: When cleared by cardiology   Consultants:  Radiology PCCM    Procedures/Significant Events:  Echocardiogram pending   I have personally reviewed and interpreted all radiology studies and my findings are as above.  VENTILATOR  SETTINGS:    Cultures   Antimicrobials: Anti-infectives (From admission, onward)   None       Devices    LINES / TUBES:      Continuous Infusions:   Objective: Vitals:   10/24/17 2023 10/25/17 0016 10/25/17 0300 10/25/17 0843  BP: 106/67 (!) 86/55 103/74 113/65  Pulse: 93 78 72 67  Resp:      Temp: 97.6 F (36.4 C) 97.9 F (36.6 C) 98 F (36.7 C) (!) 97.2 F (36.2 C)  TempSrc: Oral Oral Oral Oral  SpO2: 93% 95% 96% 96%  Weight:   132 lb 15 oz (60.3 kg)   Height:        Intake/Output Summary (Last 24 hours) at 10/25/2017 0855 Last data filed at 10/25/2017 0800 Gross per 24 hour  Intake 360 ml  Output 701 ml  Net -341 ml   Filed Weights   10/23/17 2019 10/25/17 0300  Weight: 130 lb (59 kg) 132 lb 15 oz (60.3 kg)    Examination:  General: A/O x4, positive acute respiratory distress, cachectic Neck:  Negative scars, masses, torticollis, lymphadenopathy, JVD Lungs: positive diffuse expiratory wheeze, negative crackles Cardiovascular: Tachycardic, regular  rhythm without murmur gallop or rub normal S1 and S2 Abdomen: negative abdominal pain, nondistended, positive soft, bowel sounds, no rebound, no ascites, no appreciable mass Extremities: No significant cyanosis, clubbing, or edema bilateral lower extremities Skin: Negative rashes, lesions, ulcers Psychiatric:  Negative depression, negative anxiety, negative fatigue, negative mania  Central nervous system:  Cranial nerves II through XII intact, tongue/uvula midline, all extremities muscle strength 5/5, sensation intact throughout, fnegative dysarthria, negative expressive aphasia, negative receptive aphasia.  .     Data Reviewed: Care during the described time interval was provided by me .  I have reviewed this patient's available data, including medical history, events of note, physical examination, and all test results as part of my evaluation.   CBC: Recent Labs  Lab 10/23/17 2011 10/25/17 0640   WBC 8.8 8.7  HGB 16.2 13.4  HCT 48.5 39.6  MCV 82.2 82.3  PLT 420* 314   Basic Metabolic Panel: Recent Labs  Lab 10/23/17 2011 10/25/17 0640  NA 135 139  K 4.6 4.2  CL 106 108  CO2 19* 24  GLUCOSE 102* 97  BUN 42* 22*  CREATININE 2.62* 0.82  CALCIUM 9.7 9.1   GFR: Estimated Creatinine Clearance: 86.8 mL/min (by C-G formula based on SCr of 0.82 mg/dL). Liver Function Tests: No results for input(s): AST, ALT, ALKPHOS, BILITOT, PROT, ALBUMIN in the last 168 hours. No results for input(s): LIPASE, AMYLASE in the last 168 hours. No results for input(s): AMMONIA in the last 168 hours. Coagulation Profile: No results for input(s): INR, PROTIME in the last 168 hours. Cardiac Enzymes: Recent Labs  Lab 10/24/17 0252 10/24/17 0810 10/24/17 1338  TROPONINI <0.03 <0.03 0.03*   BNP (last 3 results) No results for input(s): PROBNP in the last 8760 hours. HbA1C: Recent Labs    10/24/17 0252  HGBA1C 5.5   CBG: No results for input(s): GLUCAP in the last 168  hours. Lipid Profile: Recent Labs    10/24/17 0253  CHOL 99  HDL 15*  LDLCALC 73  TRIG 56  CHOLHDL 6.6   Thyroid Function Tests: No results for input(s): TSH, T4TOTAL, FREET4, T3FREE, THYROIDAB in the last 72 hours. Anemia Panel: No results for input(s): VITAMINB12, FOLATE, FERRITIN, TIBC, IRON, RETICCTPCT in the last 72 hours. Urine analysis:    Component Value Date/Time   COLORURINE YELLOW 06/14/2015 1943   APPEARANCEUR CLOUDY (A) 06/14/2015 1943   LABSPEC 1.018 06/14/2015 1943   PHURINE 5.0 06/14/2015 1943   GLUCOSEU NEGATIVE 06/14/2015 [place] NEGATIVE 06/14/2015 [place] 06/14/2015 [place] 06/14/2015 1943   PROTEINUR 30 (A) 06/14/2015 1943   UROBILINOGEN 0.2 08/06/2013 1436   NITRITE NEGATIVE 06/14/2015 1943   LEUKOCYTESUR MODERATE (A) 06/14/2015 1943   Sepsis Labs: @LABRCNTIP (procalcitonin:4,lacticidven:4)  ) Recent Results (from the past 240 hour(s))   MRSA PCR Screening     Status: None   Collection Time: 10/24/17  4:37 PM  Result Value Ref Range Status   MRSA by PCR NEGATIVE NEGATIVE Final    Comment:        The GeneXpert MRSA Assay (FDA approved for NASAL specimens only), is one component of a comprehensive MRSA colonization surveillance program. It is not intended to diagnose MRSA infection nor to guide or monitor treatment for MRSA infections. Performed at [facility], [place] [address] [place], [place] [address]          Radiology Studies: Dg Chest Portable 1 View  Result Date: 10/23/2017 CLINICAL DATA:  Acute onset of nonproductive cough, generalized chest pain and shortness of breath. Patient hypotensive. Dizziness. EXAM: PORTABLE CHEST 1 VIEW COMPARISON:  CT of the chest performed 07/28/2017 FINDINGS: Left apical consolidation and volume loss are again noted, with underlying cavitary component better characterized on prior CT. There is no evidence of pleural effusion or pneumothorax. The cardiomediastinal silhouette is normal in size. The patient is status post median sternotomy. An AICD is noted overlying the left chest wall, with a single lead ending at the right ventricle. No acute osseous abnormalities are seen. IMPRESSION: Left apical consolidation and volume loss again noted, with underlying cavitary component better characterized on prior CT. As before, this may reflect chronic atypical infection, such as Mycobacterium. Electronically Signed   By: Garald Balding M.D.   On: 10/23/2017 20:35        Scheduled Meds: . aspirin EC  81 mg Oral Daily  . enoxaparin (LOVENOX) injection  30 mg Subcutaneous Daily  . ipratropium  0.5 mg Nebulization TID  . levalbuterol  1.25 mg Nebulization TID  . mometasone-formoterol  2 puff Inhalation BID   Continuous Infusions:   LOS: 1 day    Time spent: 40 minutes    WOODS, Geraldo Docker, MD [facility] Pager [phone]   If 7PM-7AM, please contact  night-coverage [email or web address] Password [facility] 10/25/2017, 8:55 AM
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
## 2017-10-26 ENCOUNTER — Ambulatory Visit: Payer: Medicare HMO | Admitting: Physician Assistant

## 2017-10-26 LAB — BASIC METABOLIC PANEL
ANION GAP: 5 (ref 5–15)
Anion gap: 7 (ref 5–15)
BUN: 14 mg/dL (ref 6–20)
BUN: 11 mg/dL (ref 6–20)
CHLORIDE: 106 mmol/L (ref 101–111)
CO2: 26 mmol/L (ref 22–32)
CO2: 27 mmol/L (ref 22–32)
CREATININE: 0.77 mg/dL (ref 0.61–1.24)
Calcium: 9.4 mg/dL (ref 8.9–10.3)
Calcium: 9.3 mg/dL (ref 8.9–10.3)
Chloride: 106 mmol/L (ref 101–111)
Creatinine, Ser: 0.71 mg/dL (ref 0.61–1.24)
GFR calc Af Amer: >60 mL/min (ref >60)
GFR calc Af Amer: >60 mL/min (ref >60)
GFR calc non Af Amer: >60 mL/min (ref >60)
GLUCOSE: 141 mg/dL — AB (ref 65–99)
GLUCOSE: 94 mg/dL (ref 65–99)
POTASSIUM: 5.3 mmol/L — AB (ref 3.5–5.1)
POTASSIUM: 4.1 mmol/L (ref 3.5–5.1)
Sodium: 139 mmol/L (ref 135–145)
Sodium: 138 mmol/L (ref 135–145)

## 2017-10-26 LAB — MAGNESIUM: Magnesium: 1.9 mg/dL (ref 1.7–2.4)

## 2017-10-26 MED ORDER — ZOLPIDEM TARTRATE 5 MG PO TABS: 5.0000 mg | ORAL_TABLET | Freq: Every evening | ORAL | 0 refills | Status: DC | PRN

## 2017-10-26 MED ORDER — SACUBITRIL-VALSARTAN 24-26 MG PO TABS: 1.0000 | ORAL_TABLET | Freq: Two times a day (BID) | ORAL | 0 refills | Status: DC

## 2017-10-26 MED ORDER — IPRATROPIUM-ALBUTEROL 0.5-2.5 (3) MG/3ML IN SOLN
3.0000 mL | Freq: Three times a day (TID) | RESPIRATORY_TRACT | Status: DC
  Administered 2017-10-26 (×2): 3 mL via RESPIRATORY_TRACT
  Filled 2017-10-26 (×2): qty 3

## 2017-10-26 MED ORDER — IPRATROPIUM-ALBUTEROL 0.5-2.5 (3) MG/3ML IN SOLN: 3.0000 mL | Freq: Four times a day (QID) | RESPIRATORY_TRACT | Status: DC | PRN

## 2017-10-26 NOTE — Discharge Summary (Signed)
Physician Discharge Summary  [name] NFA:[id number] DOB: [birth year] DOA: 10/23/2017  PCP: Lucianne Lei, MD  Admit date: 10/23/2017 Discharge date: 10/26/2017  Time spent: 35 minutes  Recommendations for Outpatient Follow-up:   Chest pain and CAD:  -S/P CABG and stent placement.  Currently chest pain-free -Cardiology Dr. Candee Furbish adjusting medication. - Echocardiogram: EF remains severely reduced at 20 to 25% stable when compared to previous echocardiogram - ASA 81 mg daily - Entresto 24-26 mg BID -Hold Lasix per cardiology -Follow-up with [facility] to see Dr. Adam Phenix and [place]  on 6/5  @1530     Chronic combined systolic and diastolic CHF:  -2D echo on 09/28/2017 showed EF 20-25% with grade 2 diastolic dysfunction.  Patient does not have leg edema.  BNP 307.5. Clinically patient seems to be dry. -See chest pain   Large Cell Adenocarcinoma of lung, stage 1, 63mm RLL paraspinal mass -S/P Wedge resection RIGHT UPPER LOBE on 2/15 with recurrence 2/17 treated with XRT  -pt is followed by Dr. Curt Bears.  Per Dr. Worthy Flank note 3/12 patient is to follow-up in 6 months for repeat CT scan of chest   COPD  --Restart home medication  -Discussed at length absolute need to refrain from smoking   Tobacco abuse -See COPD   Hypotension -most likely secondary to poor EF and overdiuresis. -resolved     Acute kidney injury Recent Labs  Lab 10/23/17 2011 10/25/17 0640 10/26/17 0408 10/26/17 1140  CREATININE 2.62* 0.82 0.77 0.71  Resolved   Abnormal chest x-ray - CXR shows left apical consolidation and volume loss with underlying cavitary component characterized on prior CT.  Review of oncology notes: Dr. [name] saw patient on 08/08/2017 and reviewed all pertinent radiology scans per his notes stable.  Patient to follow-up in 6 months for repeat CT scan.      Discharge Diagnoses:  Principal Problem:   Chest pain Active Problems:   Coronary artery disease    Adenocarcinoma of lung, stage 1, 84mm RLL paraspinal mass   COPD (chronic obstructive pulmonary disease) (HCC)   Chronic combined systolic (congestive) and diastolic (congestive) heart failure (HCC)   AKI (acute kidney injury) (HCC)   Hypotension   Discharge Condition: Stable  Diet recommendation: Heart healthy  Filed Weights   10/23/17 2019 10/25/17 0300 10/26/17 0619  Weight: 130 lb (59 kg) 132 lb 15 oz (60.3 kg) 119 lb 9.6 oz (54.3 kg)    History of present illness:  57 y.o. BM PMHx STEMI, Chronic Systolic CHF (EF =46%), ischemic cardiomyopathy, AICD placement, CAD, CABG, stent placement, HLD,COPD, chronic respiratory failure on 3 L nasal cannula oxygen, , non-small cell lung cancer, resection of left upper lobe 2/15, recurrence 2017 with XRT, TB     Presents with chest pain.   He has been having intermittent chest pain in the past several days.  The chest pain is located in the substernal area, sometimes to the left side of chest. It can last from a few minutes to 30 minutes at a time. It is.  It is associated with the shortness of breath. He has dry cough, no fever or chills. Pt state that he was seen by Dr. Caryl Comes, his EP cardiologist April 25.  He was started back on Entresto. 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 He has dizzy and lightheadedness sometimes.  Patient denies nausea, vomiting, diarrhea, abdominal pain, symptoms of UTI or unilateral weakness. During his hospitalization patient was treated for chest pain/CAD's, chronic systolic and diastolic CHF,  COPD.  Following adjustment of medication patient stabilized and is ready for discharge.      Procedures: 5/29 Echocardiogram:Left ventricle: LVEF = 20 to 25% (unchanged from previous study), Diffuse   hypokinesis with regional variation. No mural thrombus.  -Right atrium: AICD wire noted in right atrium. - Atrial septum: Hypermobile interatrial septum - cannot exclude PFO.          Consultations: Radiology PCCM Cardiology      Discharge Exam: Vitals:   10/26/17 4259 10/26/17 0754 10/26/17 1302 10/26/17 1431  BP: 100/69   100/68  Pulse: 75   77  Resp:      Temp: 98.2 F (36.8 C)   98.4 F (36.9 C)  TempSrc: Oral   Oral  SpO2: 96% 97% 98% 97%  Weight: 119 lb 9.6 oz (54.3 kg)     Height:        General: A/O x4, negative acute respiratory distress, cachectic Neck:  Negative scars, masses, torticollis, lymphadenopathy, JVD Lungs:  clear to auscultation bilateral, negative wheezing, negative crackles  Cardiovascular:  Regular rhythm and rate, negative murmurs rubs or gallops, normal S1/S2     Discharge Instructions   Allergies as of 10/26/2017   No Known Allergies     Medication List    STOP taking these medications   furosemide 40 MG tablet Commonly known as:  LASIX   OXYGEN   sacubitril-valsartan 49-51 MG Commonly known as:  ENTRESTO Replaced by:  sacubitril-valsartan 24-26 MG     TAKE these medications   ADVAIR DISKUS 100-50 MCG/DOSE Aepb Generic drug:  Fluticasone-Salmeterol INHALE CONTENTS OF 1 BLISTER USING DISKUS TWO TIMES DAILY   ASPIRIN LOW DOSE 81 MG EC tablet Generic drug:  aspirin TAKE 1 TABLET BY MOUTH EVERY DAY   predniSONE 20 MG tablet Commonly known as:  DELTASONE Take 1 tablet (20 mg total) by mouth 2 (two) times daily with a meal.   sacubitril-valsartan 24-26 MG Commonly known as:  ENTRESTO Take 1 tablet by mouth 2 (two) times daily. Replaces:  sacubitril-valsartan 49-51 MG   SPIRIVA HANDIHALER 18 MCG inhalation capsule Generic drug:  tiotropium PLACE 1 CAPSULE INTO HANDIHALER AND INHALE DAILY   zolpidem 5 MG tablet Commonly known as:  AMBIEN Take 1 tablet (5 mg total) by mouth at bedtime as needed and may repeat dose one time if needed for sleep.      No Known Allergies Follow-up Information    Deboraha Sprang, MD.   Specialty:  Cardiology Contact information: [phone] [place] [address] [phone]        [facility], [place] Follow up on 11/01/2017.   Specialties:  Cardiology, Radiology Why:  Please arrive 15 minutes early for you 3:30pm appointment.  Contact information: [address] [place] [address] [phone]        [facility] Follow up.   Specialty:  Cardiology Why:  Please present on 11/01/17 anytime before your appointment at 3:30pm for a blood draw to monitor your kidney function.  Contact information: [address] [place] [phone]           The results of significant diagnostics from this hospitalization (including imaging, microbiology, ancillary and laboratory) are listed below for reference.    Significant Diagnostic Studies: Dg Chest Portable 1 View  Result Date: 10/23/2017 CLINICAL DATA:  Acute onset of nonproductive cough, generalized chest pain and shortness of breath. Patient hypotensive. Dizziness. EXAM: PORTABLE CHEST 1 VIEW  COMPARISON:  CT of the chest performed 07/28/2017 FINDINGS: Left apical consolidation and volume loss are again noted, with underlying cavitary component better characterized on prior CT. There is no evidence of pleural effusion or pneumothorax. The cardiomediastinal silhouette is normal in size. The patient is status post median sternotomy. An AICD is noted overlying the left chest wall, with a single lead ending at the right ventricle. No acute osseous abnormalities are seen. IMPRESSION: Left apical consolidation and volume loss again noted, with underlying cavitary component better characterized on prior CT. As before, this may reflect chronic atypical infection, such as Mycobacterium. Electronically Signed   By: Garald Balding M.D.   On: 10/23/2017 20:35    Microbiology: Recent Results (from the past 240 hour(s))  MRSA PCR Screening     Status: None   Collection Time: 10/24/17  4:37 PM  Result Value Ref  Range Status   MRSA by PCR NEGATIVE NEGATIVE Final    Comment:        The GeneXpert MRSA Assay (FDA approved for NASAL specimens only), is one component of a comprehensive MRSA colonization surveillance program. It is not intended to diagnose MRSA infection nor to guide or monitor treatment for MRSA infections. Performed at [facility], [place] [address] [place], [place] [address]      Labs: Basic Metabolic Panel: Recent Labs  Lab 10/23/17 2011 10/25/17 0640 10/26/17 0408 10/26/17 1140  NA 135 139 139 138  K 4.6 4.2 5.3* 4.1  CL 106 108 106 106  CO2 19* 24 26 27   GLUCOSE 102* 97 141* 94  BUN 42* 22* 14 11  CREATININE 2.62* 0.82 0.77 0.71  CALCIUM 9.7 9.1 9.4 9.3  MG  --   --  1.9  --    Liver Function Tests: No results for input(s): AST, ALT, ALKPHOS, BILITOT, PROT, ALBUMIN in the last 168 hours. No results for input(s): LIPASE, AMYLASE in the last 168 hours. No results for input(s): AMMONIA in the last 168 hours. CBC: Recent Labs  Lab 10/23/17 2011 10/25/17 0640  WBC 8.8 8.7  HGB 16.2 13.4  HCT 48.5 39.6  MCV 82.2 82.3  PLT 420* 394   Cardiac Enzymes: Recent Labs  Lab 10/24/17 0252 10/24/17 0810 10/24/17 1338  TROPONINI <0.03 <0.03 0.03*   BNP: BNP (last 3 results) Recent Labs    03/23/17 0001 10/23/17 2032  BNP 1,615.3* 307.5*    ProBNP (last 3 results) No results for input(s): PROBNP in the last 8760 hours.  CBG: No results for input(s): GLUCAP in the last 168 hours.     Signed:  Dia Crawford, MD [facility] [phone] pager
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
## 2017-11-01 ENCOUNTER — Encounter: Payer: Self-pay | Admitting: Physician Assistant

## 2017-11-01 ENCOUNTER — Telehealth: Payer: Self-pay

## 2017-11-01 ENCOUNTER — Ambulatory Visit (INDEPENDENT_AMBULATORY_CARE_PROVIDER_SITE_OTHER): Payer: Medicare HMO | Admitting: Physician Assistant

## 2017-11-01 VITALS — BP 118/60 | HR 91 | Ht 68.0 in | Wt 123.0 lb

## 2017-11-01 DIAGNOSIS — N179 Acute kidney failure, unspecified: Secondary | ICD-10-CM | POA: Diagnosis not present

## 2017-11-01 DIAGNOSIS — I255 Ischemic cardiomyopathy: Secondary | ICD-10-CM

## 2017-11-01 DIAGNOSIS — I25810 Atherosclerosis of coronary artery bypass graft(s) without angina pectoris: Secondary | ICD-10-CM | POA: Diagnosis not present

## 2017-11-01 DIAGNOSIS — Z95810 Presence of automatic (implantable) cardiac defibrillator: Secondary | ICD-10-CM | POA: Diagnosis not present

## 2017-11-01 DIAGNOSIS — C3490 Malignant neoplasm of unspecified part of unspecified bronchus or lung: Secondary | ICD-10-CM | POA: Diagnosis not present

## 2017-11-01 DIAGNOSIS — E785 Hyperlipidemia, unspecified: Secondary | ICD-10-CM

## 2017-11-01 MED ORDER — CARVEDILOL 3.125 MG PO TABS: 3.1250 mg | ORAL_TABLET | Freq: Two times a day (BID) | ORAL | 3 refills | Status: DC
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
## 2017-11-01 NOTE — Patient Instructions (Addendum)
Medication Instructions:  START Coreg 3.125mg  take 1 tablet twice a day  Labwork: Your physician recommends that you return for lab work in: TODAY-BMET  Testing/Procedures: NONE  Follow-Up: Your physician recommends that you schedule a follow-up appointment in: 2 Wks with PHARMACIST for Entresto titration  Your physician recommends that you schedule a follow-up appointment in:3 Months with DR CRENSHAW ONLY  Any Other Special Instructions Will Be Listed Below (If Applicable). If you need a refill on your cardiac medications before your next appointment, please call your pharmacy.

## 2017-11-01 NOTE — Telephone Encounter (Signed)
I did an Ship [employer] PA over the phone with Truddie Crumble at [facility] @ [phone]. Per Truddie Crumble it will take 24 -72 hrs for determination and they will notify us by fax.

## 2017-11-02 ENCOUNTER — Ambulatory Visit: Payer: Medicare HMO

## 2017-11-02 LAB — BASIC METABOLIC PANEL
BUN/Creatinine Ratio: 13 (ref 9–20)
BUN: 10 mg/dL (ref 6–24)
CO2: 23 mmol/L (ref 20–29)
Calcium: 9.8 mg/dL (ref 8.7–10.2)
Chloride: 103 mmol/L (ref 96–106)
Creatinine, Ser: 0.8 mg/dL (ref 0.76–1.27)
GFR, EST AFRICAN AMERICAN: 116 mL/min/{1.73_m2} (ref >59)
GFR, EST NON AFRICAN AMERICAN: 101 mL/min/{1.73_m2} (ref >59)
Glucose: 74 mg/dL (ref 65–99)
POTASSIUM: 5 mmol/L (ref 3.5–5.2)
SODIUM: 140 mmol/L (ref 134–144)

## 2017-11-03 ENCOUNTER — Other Ambulatory Visit: Payer: Self-pay | Admitting: *Deleted

## 2017-11-03 ENCOUNTER — Encounter: Payer: Self-pay | Admitting: Physician Assistant

## 2017-11-03 MED ORDER — TIOTROPIUM BROMIDE MONOHYDRATE 18 MCG IN CAPS: ORAL_CAPSULE | RESPIRATORY_TRACT | 5 refills | Status: DC

## 2017-11-03 MED ORDER — FLUTICASONE-SALMETEROL 100-50 MCG/DOSE IN AEPB: INHALATION_SPRAY | RESPIRATORY_TRACT | 5 refills | Status: DC

## 2017-11-03 NOTE — Telephone Encounter (Signed)
Letter received via fax from [facility] stating that the [place] PA has been approved. Approval good until 11/02/2019.   I have faxed approval letter to the pts pharmacy.

## 2017-11-03 NOTE — Progress Notes (Signed)
Patient has a visit with clinical pharmacist in 2 weeks, please repeat BMET the day before. Kidney function is stable, but potassium is approaching upper limit of normal

## 2017-11-06 ENCOUNTER — Other Ambulatory Visit: Payer: Self-pay

## 2017-11-06 DIAGNOSIS — I255 Ischemic cardiomyopathy: Secondary | ICD-10-CM

## 2017-11-06 DIAGNOSIS — I5042 Chronic combined systolic (congestive) and diastolic (congestive) heart failure: Secondary | ICD-10-CM

## 2017-11-09 ENCOUNTER — Telehealth: Payer: Self-pay | Admitting: Cardiology

## 2017-11-09 NOTE — Telephone Encounter (Signed)
Spoke with patient and lab results given to patient; see lab results note

## 2017-11-09 NOTE — Telephone Encounter (Signed)
Pt calling and stating he is returning a call to nurse about his results. Please call pt.

## 2017-11-14 ENCOUNTER — Ambulatory Visit (INDEPENDENT_AMBULATORY_CARE_PROVIDER_SITE_OTHER): Payer: Medicare HMO | Admitting: Pharmacist

## 2017-11-14 VITALS — BP 104/66 | HR 73

## 2017-11-14 DIAGNOSIS — I5042 Chronic combined systolic (congestive) and diastolic (congestive) heart failure: Secondary | ICD-10-CM

## 2017-11-14 DIAGNOSIS — I255 Ischemic cardiomyopathy: Secondary | ICD-10-CM

## 2017-11-14 LAB — BASIC METABOLIC PANEL
BUN / CREAT RATIO: 13 (ref 9–20)
BUN: 9 mg/dL (ref 6–24)
CHLORIDE: 105 mmol/L (ref 96–106)
CO2: 20 mmol/L (ref 20–29)
CREATININE: 0.71 mg/dL — AB (ref 0.76–1.27)
Calcium: 9.1 mg/dL (ref 8.7–10.2)
GFR calc Af Amer: 122 mL/min/{1.73_m2} (ref >59)
GFR calc non Af Amer: 106 mL/min/{1.73_m2} (ref >59)
GLUCOSE: 96 mg/dL (ref 65–99)
Potassium: 4.8 mmol/L (ref 3.5–5.2)
Sodium: 139 mmol/L (ref 134–144)
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
## 2017-11-14 NOTE — Patient Instructions (Signed)
It was nice to meet you today  Continue taking your current medications  We will check your kidneys and electrolytes today

## 2017-11-15 DIAGNOSIS — I5040 Unspecified combined systolic (congestive) and diastolic (congestive) heart failure: Secondary | ICD-10-CM | POA: Diagnosis not present

## 2017-11-15 DIAGNOSIS — J8410 Pulmonary fibrosis, unspecified: Secondary | ICD-10-CM | POA: Diagnosis not present

## 2017-11-15 DIAGNOSIS — J449 Chronic obstructive pulmonary disease, unspecified: Secondary | ICD-10-CM | POA: Diagnosis not present

## 2017-11-15 DIAGNOSIS — J438 Other emphysema: Secondary | ICD-10-CM | POA: Diagnosis not present

## 2017-11-15 DIAGNOSIS — I5022 Chronic systolic (congestive) heart failure: Secondary | ICD-10-CM | POA: Diagnosis not present

## 2017-11-15 NOTE — Progress Notes (Signed)
Kidney function and electrolyte stable on current medication

## 2017-11-16 ENCOUNTER — Telehealth: Payer: Self-pay

## 2017-11-16 DIAGNOSIS — I255 Ischemic cardiomyopathy: Secondary | ICD-10-CM

## 2017-11-16 NOTE — Telephone Encounter (Signed)
Patient notified directly and voiced understanding.  Order placed. Message sent to schedulers.

## 2017-11-16 NOTE — Telephone Encounter (Signed)
-----   Message from Faith, [place] sent at 11/15/2017  1:32 PM EDT ----- Regarding: schedule 3 month ago Based on report from our clinical pharmacist, patient is current on maximal tolerated dose of heart failure medication. Please arrange repeat complete echocardiogram in 3 month (around late September or early October of 2019) to see if there is any improvement in ejection fraction.   Thank you  Signed, Almyra Deforest PA Pager: [phone]

## 2017-12-05 DIAGNOSIS — J449 Chronic obstructive pulmonary disease, unspecified: Secondary | ICD-10-CM | POA: Diagnosis not present

## 2017-12-05 DIAGNOSIS — M12372 Palindromic rheumatism, left ankle and foot: Secondary | ICD-10-CM | POA: Diagnosis not present

## 2017-12-05 DIAGNOSIS — Z681 Body mass index (BMI) 19 or less, adult: Secondary | ICD-10-CM | POA: Diagnosis not present

## 2017-12-05 DIAGNOSIS — Z125 Encounter for screening for malignant neoplasm of prostate: Secondary | ICD-10-CM | POA: Diagnosis not present

## 2017-12-05 DIAGNOSIS — I255 Ischemic cardiomyopathy: Secondary | ICD-10-CM | POA: Diagnosis not present

## 2017-12-05 DIAGNOSIS — I5042 Chronic combined systolic (congestive) and diastolic (congestive) heart failure: Secondary | ICD-10-CM | POA: Diagnosis not present

## 2017-12-05 DIAGNOSIS — C3490 Malignant neoplasm of unspecified part of unspecified bronchus or lung: Secondary | ICD-10-CM | POA: Diagnosis not present

## 2017-12-05 DIAGNOSIS — I10 Essential (primary) hypertension: Secondary | ICD-10-CM | POA: Diagnosis not present

## 2017-12-05 DIAGNOSIS — J441 Chronic obstructive pulmonary disease with (acute) exacerbation: Secondary | ICD-10-CM | POA: Diagnosis not present

## 2017-12-05 DIAGNOSIS — E118 Type 2 diabetes mellitus with unspecified complications: Secondary | ICD-10-CM | POA: Diagnosis not present

## 2017-12-05 DIAGNOSIS — E782 Mixed hyperlipidemia: Secondary | ICD-10-CM | POA: Diagnosis not present

## 2017-12-05 DIAGNOSIS — R972 Elevated prostate specific antigen [PSA]: Secondary | ICD-10-CM | POA: Diagnosis not present

## 2017-12-15 DIAGNOSIS — I5022 Chronic systolic (congestive) heart failure: Secondary | ICD-10-CM | POA: Diagnosis not present

## 2017-12-15 DIAGNOSIS — I5040 Unspecified combined systolic (congestive) and diastolic (congestive) heart failure: Secondary | ICD-10-CM | POA: Diagnosis not present

## 2017-12-15 DIAGNOSIS — J449 Chronic obstructive pulmonary disease, unspecified: Secondary | ICD-10-CM | POA: Diagnosis not present

## 2017-12-15 DIAGNOSIS — J8410 Pulmonary fibrosis, unspecified: Secondary | ICD-10-CM | POA: Diagnosis not present

## 2017-12-15 DIAGNOSIS — J438 Other emphysema: Secondary | ICD-10-CM | POA: Diagnosis not present

## 2017-12-18 ENCOUNTER — Telehealth: Payer: Self-pay | Admitting: Cardiology

## 2017-12-18 MED ORDER — SACUBITRIL-VALSARTAN 24-26 MG PO TABS: 1.0000 | ORAL_TABLET | Freq: Two times a day (BID) | ORAL | 3 refills | Status: DC

## 2017-12-18 NOTE — Telephone Encounter (Signed)
°*  STAT* If patient is at the pharmacy, call can be transferred to refill team.   1. Which medications need to be refilled? (please list name of each medication and dose if known) Entresto  2. Which pharmacy/location (including street and city if local pharmacy) is medication to be sent to?[facility] 3. Do they need a 30 day or 90 day supply? 180 and refills

## 2017-12-18 NOTE — Telephone Encounter (Signed)
Spoke with pt and informed that refill for [facility] went sent over to the pharmacy.

## 2017-12-18 NOTE — Addendum Note (Signed)
Addended by: Meryl Crutch on: 12/18/2017 05:38 PM   Modules accepted: Orders

## 2017-12-20 ENCOUNTER — Encounter: Payer: Medicare HMO | Admitting: *Deleted

## 2017-12-20 ENCOUNTER — Telehealth: Payer: Self-pay | Admitting: Cardiology

## 2017-12-20 NOTE — Telephone Encounter (Signed)
Spoke with pt and reminded pt of remote transmission that is due today. Pt verbalized understanding.   

## 2017-12-21 ENCOUNTER — Encounter: Payer: Self-pay | Admitting: Cardiology

## 2017-12-22 ENCOUNTER — Other Ambulatory Visit: Payer: Self-pay | Admitting: Cardiology

## 2017-12-22 NOTE — Telephone Encounter (Signed)
°*  STAT* If patient is at the pharmacy, call can be transferred to refill team.   1. Which medications need to be refilled? (please list name of each medication and dose if known) Entresto-sent to his local pharmacy until his mail order comes  2. Which pharmacy/location (including street and city if local pharmacy) is medication to be sent to?[facility] on [address], [place] 3. Do they need a 30 day or 90 day supply? [place]

## 2017-12-25 MED ORDER — SACUBITRIL-VALSARTAN 24-26 MG PO TABS: 1.0000 | ORAL_TABLET | Freq: Two times a day (BID) | ORAL | 3 refills | Status: DC

## 2018-01-15 DIAGNOSIS — J449 Chronic obstructive pulmonary disease, unspecified: Secondary | ICD-10-CM | POA: Diagnosis not present

## 2018-01-15 DIAGNOSIS — J8410 Pulmonary fibrosis, unspecified: Secondary | ICD-10-CM | POA: Diagnosis not present

## 2018-01-15 DIAGNOSIS — I5040 Unspecified combined systolic (congestive) and diastolic (congestive) heart failure: Secondary | ICD-10-CM | POA: Diagnosis not present

## 2018-01-15 DIAGNOSIS — J438 Other emphysema: Secondary | ICD-10-CM | POA: Diagnosis not present

## 2018-01-15 DIAGNOSIS — I5022 Chronic systolic (congestive) heart failure: Secondary | ICD-10-CM | POA: Diagnosis not present

## 2018-02-05 ENCOUNTER — Other Ambulatory Visit: Payer: Medicare Other
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
## 2018-02-06 ENCOUNTER — Encounter: Payer: Self-pay | Admitting: Cardiology

## 2018-02-06 ENCOUNTER — Telehealth: Payer: Self-pay

## 2018-02-06 ENCOUNTER — Ambulatory Visit (INDEPENDENT_AMBULATORY_CARE_PROVIDER_SITE_OTHER): Payer: Medicare HMO | Admitting: Cardiology

## 2018-02-06 VITALS — BP 102/64 | HR 69 | Ht 68.0 in | Wt 121.0 lb

## 2018-02-06 DIAGNOSIS — I25810 Atherosclerosis of coronary artery bypass graft(s) without angina pectoris: Secondary | ICD-10-CM | POA: Diagnosis not present

## 2018-02-06 DIAGNOSIS — I255 Ischemic cardiomyopathy: Secondary | ICD-10-CM | POA: Diagnosis not present

## 2018-02-06 DIAGNOSIS — Z79899 Other long term (current) drug therapy: Secondary | ICD-10-CM

## 2018-02-06 MED ORDER — CARVEDILOL 6.25 MG PO TABS: 6.2500 mg | ORAL_TABLET | Freq: Two times a day (BID) | ORAL | 3 refills | Status: DC

## 2018-02-06 NOTE — Patient Instructions (Addendum)
Medication Instructions: Your Physician recommend you make the following changes to your medication. Increase: Coreg 6.25 mg two times aday   If you need a refill on your cardiac medications before your next appointment, please call your pharmacy.   Labwork: None  Procedures/Testing: None  Follow-Up: Your physician wants you to follow-up in 6 months with Dr. Stanford Breed. You will receive a reminder letter in the mail two months in advance. If you don't receive a letter, please call our office at [phone] to schedule this follow-up appointment.   Special Instructions: Call office with updated medication information, ask for Adonia Porada.    Thank you for choosing [facility]!!

## 2018-02-06 NOTE — Telephone Encounter (Signed)
Per Dr. Stanford Breed, pt is to call office with updated cholesterol medication .

## 2018-02-07 ENCOUNTER — Encounter (HOSPITAL_COMMUNITY): Payer: Self-pay

## 2018-02-07 ENCOUNTER — Ambulatory Visit (HOSPITAL_COMMUNITY)
Admission: RE | Admit: 2018-02-07 | Discharge: 2018-02-07 | Disposition: A | Discharge status: Home / Self Care | Payer: Medicare HMO | Source: Ambulatory Visit | Attending: Internal Medicine | Admitting: Internal Medicine

## 2018-02-07 DIAGNOSIS — I700 Atherosclerosis of aorta: Secondary | ICD-10-CM | POA: Insufficient documentation

## 2018-02-07 DIAGNOSIS — J432 Centrilobular emphysema: Secondary | ICD-10-CM | POA: Diagnosis not present

## 2018-02-07 DIAGNOSIS — C3490 Malignant neoplasm of unspecified part of unspecified bronchus or lung: Secondary | ICD-10-CM | POA: Diagnosis not present

## 2018-02-07 DIAGNOSIS — C3431 Malignant neoplasm of lower lobe, right bronchus or lung: Secondary | ICD-10-CM | POA: Diagnosis not present

## 2018-02-07 DIAGNOSIS — R918 Other nonspecific abnormal finding of lung field: Secondary | ICD-10-CM | POA: Diagnosis not present

## 2018-02-07 DIAGNOSIS — Z923 Personal history of irradiation: Secondary | ICD-10-CM | POA: Insufficient documentation

## 2018-02-07 DIAGNOSIS — R590 Localized enlarged lymph nodes: Secondary | ICD-10-CM | POA: Diagnosis not present

## 2018-02-07 DIAGNOSIS — J438 Other emphysema: Secondary | ICD-10-CM | POA: Diagnosis not present

## 2018-02-07 MED ORDER — IOHEXOL 300 MG/ML  SOLN
75.0000 mL | Freq: Once | INTRAMUSCULAR | Status: AC | PRN
  Administered 2018-02-07: 75 mL via INTRAVENOUS

## 2018-02-07 NOTE — Telephone Encounter (Signed)
New message   Patient is returning call.

## 2018-02-07 NOTE — Telephone Encounter (Signed)
Returned pt's call. [email or web address] 

## 2018-02-08 ENCOUNTER — Ambulatory Visit: Payer: Medicare Other | Admitting: Oncology

## 2018-02-08 MED ORDER — ROSUVASTATIN CALCIUM 40 MG PO TABS: 40.0000 mg | ORAL_TABLET | Freq: Every day | ORAL | 3 refills | Status: DC

## 2018-02-08 NOTE — Telephone Encounter (Signed)
Routed to [employer]

## 2018-02-08 NOTE — Telephone Encounter (Signed)
Spoke with pt who states he is currently not taking any cholesterol medication. Pt advised that per Dr. Stanford Breed, he would like for him to start on Crestor 40 mg daily and return for labwork (Lipid, LFT) in 4 weeks. Pt verbalized understanding.

## 2018-02-08 NOTE — Telephone Encounter (Signed)
° °  Patient calling to report he is taking only Entresto and Carvedilol 3.12.  Returning call from 02/07/18 Patient states he does not need a return call unless he needs to change dosage

## 2018-02-15 ENCOUNTER — Telehealth: Payer: Self-pay | Admitting: Internal Medicine

## 2018-02-15 DIAGNOSIS — J438 Other emphysema: Secondary | ICD-10-CM | POA: Diagnosis not present

## 2018-02-15 DIAGNOSIS — J8410 Pulmonary fibrosis, unspecified: Secondary | ICD-10-CM | POA: Diagnosis not present

## 2018-02-15 DIAGNOSIS — J449 Chronic obstructive pulmonary disease, unspecified: Secondary | ICD-10-CM | POA: Diagnosis not present

## 2018-02-15 DIAGNOSIS — I5022 Chronic systolic (congestive) heart failure: Secondary | ICD-10-CM | POA: Diagnosis not present

## 2018-02-15 DIAGNOSIS — I5040 Unspecified combined systolic (congestive) and diastolic (congestive) heart failure: Secondary | ICD-10-CM | POA: Diagnosis not present

## 2018-02-15 NOTE — Telephone Encounter (Signed)
Appointment complete per 9/13 schedule message. Spoke with patient re f/u 10/1.

## 2018-02-27 ENCOUNTER — Inpatient Hospital Stay: Payer: Medicare HMO | Attending: Internal Medicine | Admitting: Internal Medicine

## 2018-02-27 ENCOUNTER — Telehealth: Payer: Self-pay

## 2018-02-27 ENCOUNTER — Encounter: Payer: Self-pay | Admitting: Internal Medicine

## 2018-02-27 VITALS — BP 119/76 | HR 70 | Temp 97.6°F | Resp 18 | Ht 68.0 in | Wt 122.1 lb

## 2018-02-27 DIAGNOSIS — Z955 Presence of coronary angioplasty implant and graft: Secondary | ICD-10-CM | POA: Insufficient documentation

## 2018-02-27 DIAGNOSIS — R0602 Shortness of breath: Secondary | ICD-10-CM | POA: Diagnosis not present

## 2018-02-27 DIAGNOSIS — Z902 Acquired absence of lung [part of]: Secondary | ICD-10-CM | POA: Insufficient documentation

## 2018-02-27 DIAGNOSIS — Z79899 Other long term (current) drug therapy: Secondary | ICD-10-CM | POA: Diagnosis not present

## 2018-02-27 DIAGNOSIS — Z95810 Presence of automatic (implantable) cardiac defibrillator: Secondary | ICD-10-CM | POA: Diagnosis not present

## 2018-02-27 DIAGNOSIS — I252 Old myocardial infarction: Secondary | ICD-10-CM | POA: Insufficient documentation

## 2018-02-27 DIAGNOSIS — Z951 Presence of aortocoronary bypass graft: Secondary | ICD-10-CM | POA: Diagnosis not present

## 2018-02-27 DIAGNOSIS — F17200 Nicotine dependence, unspecified, uncomplicated: Secondary | ICD-10-CM

## 2018-02-27 DIAGNOSIS — R0609 Other forms of dyspnea: Secondary | ICD-10-CM | POA: Insufficient documentation

## 2018-02-27 DIAGNOSIS — Z9981 Dependence on supplemental oxygen: Secondary | ICD-10-CM | POA: Diagnosis not present

## 2018-02-27 DIAGNOSIS — Z923 Personal history of irradiation: Secondary | ICD-10-CM | POA: Diagnosis not present

## 2018-02-27 DIAGNOSIS — Z7982 Long term (current) use of aspirin: Secondary | ICD-10-CM | POA: Diagnosis not present

## 2018-02-27 DIAGNOSIS — Z85118 Personal history of other malignant neoplasm of bronchus and lung: Secondary | ICD-10-CM | POA: Insufficient documentation

## 2018-02-27 DIAGNOSIS — I700 Atherosclerosis of aorta: Secondary | ICD-10-CM | POA: Insufficient documentation

## 2018-02-27 DIAGNOSIS — R918 Other nonspecific abnormal finding of lung field: Secondary | ICD-10-CM | POA: Diagnosis not present

## 2018-02-27 DIAGNOSIS — C3431 Malignant neoplasm of lower lobe, right bronchus or lung: Secondary | ICD-10-CM

## 2018-02-27 DIAGNOSIS — J439 Emphysema, unspecified: Secondary | ICD-10-CM | POA: Insufficient documentation

## 2018-02-27 DIAGNOSIS — C3490 Malignant neoplasm of unspecified part of unspecified bronchus or lung: Secondary | ICD-10-CM

## 2018-02-27 NOTE — Telephone Encounter (Signed)
Printed avs and calender of upcoming appointment. Per 10/1 los
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
## 2018-03-01 ENCOUNTER — Ambulatory Visit (HOSPITAL_COMMUNITY): Payer: Medicare HMO | Attending: Physician Assistant

## 2018-03-01 ENCOUNTER — Other Ambulatory Visit: Payer: Self-pay

## 2018-03-01 DIAGNOSIS — I509 Heart failure, unspecified: Secondary | ICD-10-CM | POA: Insufficient documentation

## 2018-03-01 DIAGNOSIS — I252 Old myocardial infarction: Secondary | ICD-10-CM | POA: Diagnosis not present

## 2018-03-01 DIAGNOSIS — Z951 Presence of aortocoronary bypass graft: Secondary | ICD-10-CM | POA: Insufficient documentation

## 2018-03-01 DIAGNOSIS — I2510 Atherosclerotic heart disease of native coronary artery without angina pectoris: Secondary | ICD-10-CM | POA: Diagnosis not present

## 2018-03-01 DIAGNOSIS — I071 Rheumatic tricuspid insufficiency: Secondary | ICD-10-CM | POA: Insufficient documentation

## 2018-03-01 DIAGNOSIS — J449 Chronic obstructive pulmonary disease, unspecified: Secondary | ICD-10-CM | POA: Insufficient documentation

## 2018-03-01 DIAGNOSIS — E785 Hyperlipidemia, unspecified: Secondary | ICD-10-CM | POA: Diagnosis not present

## 2018-03-01 DIAGNOSIS — I255 Ischemic cardiomyopathy: Secondary | ICD-10-CM | POA: Insufficient documentation

## 2018-03-01 NOTE — Progress Notes (Unsigned)
Patient refused to consent to the administation of Definity.

## 2018-03-05 DIAGNOSIS — N5201 Erectile dysfunction due to arterial insufficiency: Secondary | ICD-10-CM | POA: Diagnosis not present

## 2018-03-05 DIAGNOSIS — R972 Elevated prostate specific antigen [PSA]: Secondary | ICD-10-CM | POA: Diagnosis not present

## 2018-03-05 DIAGNOSIS — A599 Trichomoniasis, unspecified: Secondary | ICD-10-CM | POA: Diagnosis not present

## 2018-03-17 DIAGNOSIS — I5040 Unspecified combined systolic (congestive) and diastolic (congestive) heart failure: Secondary | ICD-10-CM | POA: Diagnosis not present

## 2018-03-17 DIAGNOSIS — J8410 Pulmonary fibrosis, unspecified: Secondary | ICD-10-CM | POA: Diagnosis not present

## 2018-03-17 DIAGNOSIS — I5022 Chronic systolic (congestive) heart failure: Secondary | ICD-10-CM | POA: Diagnosis not present

## 2018-03-17 DIAGNOSIS — J449 Chronic obstructive pulmonary disease, unspecified: Secondary | ICD-10-CM | POA: Diagnosis not present

## 2018-03-17 DIAGNOSIS — J438 Other emphysema: Secondary | ICD-10-CM | POA: Diagnosis not present

## 2018-03-22 ENCOUNTER — Encounter: Payer: Self-pay | Admitting: Cardiology

## 2018-03-23 ENCOUNTER — Telehealth: Payer: Self-pay | Admitting: *Deleted

## 2018-03-23 ENCOUNTER — Telehealth: Payer: Self-pay | Admitting: Cardiology

## 2018-03-23 NOTE — Telephone Encounter (Signed)
New message:   Patient having some concerns about some medications.

## 2018-03-23 NOTE — Telephone Encounter (Signed)
Medication Instructions:  (per oc note 02-06-18) Your Physician recommend you make the following changes to your medication. Increase: Coreg 6.25 mg two times a day  Pt calling because he is confused he received rousavastatin  in the mail and did not know how he was supposed to take. S/w pt no further questions.

## 2018-03-26 ENCOUNTER — Ambulatory Visit (INDEPENDENT_AMBULATORY_CARE_PROVIDER_SITE_OTHER): Payer: Medicare HMO | Admitting: *Deleted

## 2018-03-26 ENCOUNTER — Telehealth: Payer: Self-pay | Admitting: Internal Medicine

## 2018-03-26 DIAGNOSIS — I255 Ischemic cardiomyopathy: Secondary | ICD-10-CM | POA: Diagnosis not present

## 2018-03-26 DIAGNOSIS — E785 Hyperlipidemia, unspecified: Secondary | ICD-10-CM

## 2018-03-26 NOTE — Telephone Encounter (Signed)
Instructed Mr. Voigt how to send a transmission- monitor connected. Patient agreeable and appreciative of call.

## 2018-03-26 NOTE — Telephone Encounter (Signed)
New message     1. Has your device fired? no  2. Is you device beeping? no 3. Are you experiencing draining or swelling at device site?no  4. Are you calling to see if we received your device transmission? Yes   5. Have you passed out? no    Please route to [place]

## 2018-03-27 NOTE — Progress Notes (Signed)
Remote ICD transmission.   

## 2018-04-02 DIAGNOSIS — R972 Elevated prostate specific antigen [PSA]: Secondary | ICD-10-CM | POA: Diagnosis not present

## 2018-04-17 DIAGNOSIS — J449 Chronic obstructive pulmonary disease, unspecified: Secondary | ICD-10-CM | POA: Diagnosis not present

## 2018-04-17 DIAGNOSIS — I5040 Unspecified combined systolic (congestive) and diastolic (congestive) heart failure: Secondary | ICD-10-CM | POA: Diagnosis not present

## 2018-04-17 DIAGNOSIS — J8410 Pulmonary fibrosis, unspecified: Secondary | ICD-10-CM | POA: Diagnosis not present

## 2018-04-17 DIAGNOSIS — I5022 Chronic systolic (congestive) heart failure: Secondary | ICD-10-CM | POA: Diagnosis not present

## 2018-04-17 DIAGNOSIS — J438 Other emphysema: Secondary | ICD-10-CM | POA: Diagnosis not present

## 2018-05-02 DIAGNOSIS — F17210 Nicotine dependence, cigarettes, uncomplicated: Secondary | ICD-10-CM | POA: Diagnosis not present

## 2018-05-02 DIAGNOSIS — Z0000 Encounter for general adult medical examination without abnormal findings: Secondary | ICD-10-CM | POA: Diagnosis not present

## 2018-05-02 DIAGNOSIS — I509 Heart failure, unspecified: Secondary | ICD-10-CM | POA: Diagnosis not present

## 2018-05-02 DIAGNOSIS — C3490 Malignant neoplasm of unspecified part of unspecified bronchus or lung: Secondary | ICD-10-CM | POA: Diagnosis not present

## 2018-05-02 DIAGNOSIS — J432 Centrilobular emphysema: Secondary | ICD-10-CM | POA: Diagnosis not present

## 2018-05-02 DIAGNOSIS — I208 Other forms of angina pectoris: Secondary | ICD-10-CM | POA: Diagnosis not present

## 2018-05-02 DIAGNOSIS — E46 Unspecified protein-calorie malnutrition: Secondary | ICD-10-CM | POA: Diagnosis not present

## 2018-05-02 DIAGNOSIS — I700 Atherosclerosis of aorta: Secondary | ICD-10-CM | POA: Diagnosis not present

## 2018-05-02 DIAGNOSIS — I25810 Atherosclerosis of coronary artery bypass graft(s) without angina pectoris: Secondary | ICD-10-CM | POA: Diagnosis not present

## 2018-05-17 DIAGNOSIS — J449 Chronic obstructive pulmonary disease, unspecified: Secondary | ICD-10-CM | POA: Diagnosis not present

## 2018-05-17 DIAGNOSIS — I5022 Chronic systolic (congestive) heart failure: Secondary | ICD-10-CM | POA: Diagnosis not present

## 2018-05-17 DIAGNOSIS — I5040 Unspecified combined systolic (congestive) and diastolic (congestive) heart failure: Secondary | ICD-10-CM | POA: Diagnosis not present

## 2018-05-17 DIAGNOSIS — J8410 Pulmonary fibrosis, unspecified: Secondary | ICD-10-CM | POA: Diagnosis not present

## 2018-05-17 DIAGNOSIS — J438 Other emphysema: Secondary | ICD-10-CM | POA: Diagnosis not present

## 2018-05-22 DIAGNOSIS — N5201 Erectile dysfunction due to arterial insufficiency: Secondary | ICD-10-CM | POA: Diagnosis not present

## 2018-05-22 DIAGNOSIS — C61 Malignant neoplasm of prostate: Secondary | ICD-10-CM | POA: Diagnosis not present

## 2018-05-25 DIAGNOSIS — I700 Atherosclerosis of aorta: Secondary | ICD-10-CM | POA: Diagnosis not present

## 2018-05-25 DIAGNOSIS — Z0001 Encounter for general adult medical examination with abnormal findings: Secondary | ICD-10-CM | POA: Diagnosis not present

## 2018-05-25 DIAGNOSIS — Z1211 Encounter for screening for malignant neoplasm of colon: Secondary | ICD-10-CM | POA: Diagnosis not present

## 2018-05-25 DIAGNOSIS — Z23 Encounter for immunization: Secondary | ICD-10-CM | POA: Diagnosis not present

## 2018-05-25 DIAGNOSIS — C3491 Malignant neoplasm of unspecified part of right bronchus or lung: Secondary | ICD-10-CM | POA: Diagnosis not present

## 2018-05-25 DIAGNOSIS — I110 Hypertensive heart disease with heart failure: Secondary | ICD-10-CM | POA: Diagnosis not present

## 2018-05-25 DIAGNOSIS — Z95810 Presence of automatic (implantable) cardiac defibrillator: Secondary | ICD-10-CM | POA: Diagnosis not present

## 2018-05-25 DIAGNOSIS — E46 Unspecified protein-calorie malnutrition: Secondary | ICD-10-CM | POA: Diagnosis not present

## 2018-05-25 DIAGNOSIS — I5022 Chronic systolic (congestive) heart failure: Secondary | ICD-10-CM | POA: Diagnosis not present

## 2018-05-29 ENCOUNTER — Inpatient Hospital Stay: Payer: Medicare HMO | Attending: Internal Medicine

## 2018-05-29 DIAGNOSIS — C3490 Malignant neoplasm of unspecified part of unspecified bronchus or lung: Secondary | ICD-10-CM

## 2018-05-29 DIAGNOSIS — Z85118 Personal history of other malignant neoplasm of bronchus and lung: Secondary | ICD-10-CM | POA: Diagnosis not present

## 2018-05-29 LAB — CBC WITH DIFFERENTIAL (CANCER CENTER ONLY)
Abs Immature Granulocytes: 0.03 10*3/uL (ref 0.00–0.07)
Basophils Absolute: 0 10*3/uL (ref 0.0–0.1)
Basophils Relative: 0 %
Eosinophils Absolute: 0.3 10*3/uL (ref 0.0–0.5)
Eosinophils Relative: 4 %
HCT: 40.7 % (ref 39.0–52.0)
Hemoglobin: 13.8 g/dL (ref 13.0–17.0)
Immature Granulocytes: 0 %
LYMPHS ABS: 2.4 10*3/uL (ref 0.7–4.0)
Lymphocytes Relative: 33 %
MCH: 28.8 pg (ref 26.0–34.0)
MCHC: 33.9 g/dL (ref 30.0–36.0)
MCV: 85 fL (ref 80.0–100.0)
MONOS PCT: 7 %
Monocytes Absolute: 0.5 10*3/uL (ref 0.1–1.0)
Neutro Abs: 4 10*3/uL (ref 1.7–7.7)
Neutrophils Relative %: 56 %
Platelet Count: 280 10*3/uL (ref 150–400)
RBC: 4.79 MIL/uL (ref 4.22–5.81)
RDW: 14.1 % (ref 11.5–15.5)
WBC Count: 7.3 10*3/uL (ref 4.0–10.5)
nRBC: 0 % (ref 0.0–0.2)

## 2018-05-29 LAB — CUP PACEART REMOTE DEVICE CHECK
Battery Remaining Longevity: 60 mo
Battery Remaining Percentage: 60 %
Battery Voltage: 2.98 V
Brady Statistic RV Percent Paced: 1 %
Date Time Interrogation Session: 20191028191528
HIGH POWER IMPEDANCE MEASURED VALUE: 56 Ohm
HighPow Impedance: 56 Ohm
Implantable Lead Implant Date: 20140929
Implantable Lead Location: 753860
Implantable Lead Model: 181
Implantable Lead Serial Number: 325827
Implantable Pulse Generator Implant Date: 20140929
Lead Channel Impedance Value: 330 Ohm
Lead Channel Pacing Threshold Amplitude: 1 V
Lead Channel Pacing Threshold Pulse Width: 0.5 ms
Lead Channel Sensing Intrinsic Amplitude: 11.3 mV
Lead Channel Setting Pacing Amplitude: 2.5 V
Lead Channel Setting Pacing Pulse Width: 0.5 ms
Lead Channel Setting Sensing Sensitivity: 0.5 mV
MDC IDC PG SERIAL: 7132440

## 2018-05-29 LAB — CMP (CANCER CENTER ONLY)
ALT: 61 U/L — ABNORMAL HIGH (ref 0–44)
AST: 51 U/L — AB (ref 15–41)
Albumin: 3.6 g/dL (ref 3.5–5.0)
Alkaline Phosphatase: 71 U/L (ref 38–126)
Anion gap: 9 (ref 5–15)
BUN: 11 mg/dL (ref 6–20)
CO2: 25 mmol/L (ref 22–32)
CREATININE: 0.94 mg/dL (ref 0.61–1.24)
Calcium: 9.6 mg/dL (ref 8.9–10.3)
Chloride: 106 mmol/L (ref 98–111)
GFR, Est AFR Am: >60 mL/min (ref >60)
GFR, Estimated: >60 mL/min (ref >60)
Glucose, Bld: 102 mg/dL — ABNORMAL HIGH (ref 70–99)
POTASSIUM: 4.9 mmol/L (ref 3.5–5.1)
Sodium: 140 mmol/L (ref 135–145)
Total Bilirubin: 0.6 mg/dL (ref 0.3–1.2)
Total Protein: 8.4 g/dL — ABNORMAL HIGH (ref 6.5–8.1)

## 2018-06-01 ENCOUNTER — Other Ambulatory Visit: Payer: Self-pay | Admitting: Urology

## 2018-06-01 ENCOUNTER — Other Ambulatory Visit (HOSPITAL_COMMUNITY): Payer: Self-pay | Admitting: Urology

## 2018-06-01 DIAGNOSIS — C61 Malignant neoplasm of prostate: Secondary | ICD-10-CM

## 2018-06-05 ENCOUNTER — Telehealth: Payer: Self-pay | Admitting: Internal Medicine

## 2018-06-05 ENCOUNTER — Ambulatory Visit: Payer: Medicaid Other | Admitting: Internal Medicine

## 2018-06-05 NOTE — Telephone Encounter (Signed)
R/s appt per 1/7 sch message - pt is aware of appt date and time   

## 2018-06-06 ENCOUNTER — Ambulatory Visit: Payer: Medicaid Other | Admitting: Internal Medicine

## 2018-06-06 ENCOUNTER — Telehealth: Payer: Self-pay | Admitting: *Deleted

## 2018-06-06 NOTE — Telephone Encounter (Signed)
TC from patient stating that he needed to cancel his appt today with Dr. [name] d/t having head cold and also having diarrhea today. Denies fever or chills. Advised to get some Imodium from his phamacy and take as directed, increase fluid intake. Scheduling message sent to reschedule appt.

## 2018-06-07 ENCOUNTER — Telehealth: Payer: Self-pay | Admitting: Internal Medicine

## 2018-06-07 NOTE — Telephone Encounter (Signed)
Called pt per 1/8 sch message - unable to reach patient - left message for patient to call back to r./s

## 2018-06-19 ENCOUNTER — Encounter (HOSPITAL_COMMUNITY)
Admission: RE | Admit: 2018-06-19 | Discharge: 2018-06-19 | Disposition: A | Discharge status: Home / Self Care | Payer: Medicare HMO | Source: Ambulatory Visit | Attending: Urology | Admitting: Urology

## 2018-06-19 DIAGNOSIS — C61 Malignant neoplasm of prostate: Secondary | ICD-10-CM | POA: Insufficient documentation

## 2018-06-19 MED ORDER — TECHNETIUM TC 99M MEDRONATE IV KIT
20.0000 | PACK | Freq: Once | INTRAVENOUS | Status: AC | PRN
  Administered 2018-06-19: 21.5 via INTRAVENOUS

## 2018-06-25 ENCOUNTER — Ambulatory Visit (INDEPENDENT_AMBULATORY_CARE_PROVIDER_SITE_OTHER): Payer: Medicare HMO

## 2018-06-25 DIAGNOSIS — I255 Ischemic cardiomyopathy: Secondary | ICD-10-CM

## 2018-06-28 ENCOUNTER — Encounter: Payer: Self-pay | Admitting: *Deleted

## 2018-06-28 NOTE — Progress Notes (Signed)
Remote ICD transmission.   

## 2018-06-28 NOTE — Progress Notes (Signed)
Not needed

## 2018-06-29 LAB — CUP PACEART REMOTE DEVICE CHECK
Battery Remaining Longevity: 59 mo
Battery Remaining Percentage: 58 %
Battery Voltage: 2.98 V
Brady Statistic RV Percent Paced: 1 %
Date Time Interrogation Session: 20200129183805
HighPow Impedance: 61 Ohm
HighPow Impedance: 61 Ohm
Implantable Lead Implant Date: 20140929
Implantable Lead Location: 753860
Implantable Lead Model: 181
Implantable Lead Serial Number: 325827
Implantable Pulse Generator Implant Date: 20140929
Lead Channel Impedance Value: 340 Ohm
Lead Channel Pacing Threshold Amplitude: 1 V
Lead Channel Pacing Threshold Pulse Width: 0.5 ms
Lead Channel Sensing Intrinsic Amplitude: 11.3 mV
Lead Channel Setting Pacing Amplitude: 2.5 V
Lead Channel Setting Sensing Sensitivity: 0.5 mV
MDC IDC SET LEADCHNL RV PACING PULSEWIDTH: 0.5 ms
Pulse Gen Serial Number: 7132440

## 2018-07-02 ENCOUNTER — Encounter: Payer: Self-pay | Admitting: Radiation Oncology

## 2018-07-02 NOTE — Progress Notes (Signed)
GU Location of Tumor / Histology: high risk prostate cancer  If Prostate Cancer, Gleason Score is (4 + 4) and PSA iss (7.8). Prostate volume: 33 mL.   Meric Joye had a PSA of 4.8 in 2016 but did not keep follow up appointments. Dr. Criss Rosales referred patient to Dr. Tresa Moore in October 2019 for further evaluation of elevated PSA of 7.8 in 08/2017.   Biopsies of prostate (if applicable) revealed:    Past/Anticipated interventions by urology, if any: prostate biopsy, CT abd/pelvis (neg), bone scan (neg), referral for consideration of radiation therapy (patient most interested in brachytherapy). Dr. Tresa Moore recommends radiation + 2 years of ADT vs. Surgery.  Past/Anticipated interventions by medical oncology, if any: no  Weight changes, if any: Denies  Bowel/Bladder complaints, if any: Denies hematuria or dysuria. Denies urinary leakage or incontinence.   Nausea/Vomiting, if any: no  Pain issues, if any:  no  SAFETY ISSUES:  Prior radiation? yes, SBRT under the care of Dr. Sondra Come in 2017  Pacemaker/ICD? yes  Possible current pregnancy? no, male patient.  Is the patient on methotrexate? no  Current Complaints / other details:  58 year old male. NKDA. Resides with his cousin here in [place].

## 2018-07-03 ENCOUNTER — Encounter: Payer: Self-pay | Admitting: Medical Oncology

## 2018-07-03 ENCOUNTER — Encounter: Payer: Self-pay | Admitting: Radiation Oncology

## 2018-07-03 ENCOUNTER — Ambulatory Visit
Admission: RE | Admit: 2018-07-03 | Discharge: 2018-07-03 | Disposition: A | Discharge status: Home / Self Care | Payer: Medicare HMO | Source: Ambulatory Visit | Attending: Radiation Oncology | Admitting: Radiation Oncology

## 2018-07-03 ENCOUNTER — Telehealth: Payer: Self-pay | Admitting: Radiation Oncology

## 2018-07-03 ENCOUNTER — Telehealth: Payer: Self-pay | Admitting: *Deleted

## 2018-07-03 ENCOUNTER — Other Ambulatory Visit: Payer: Self-pay

## 2018-07-03 VITALS — BP 115/64 | HR 61 | Temp 97.5°F | Resp 18 | Ht 68.0 in | Wt 124.5 lb

## 2018-07-03 DIAGNOSIS — J449 Chronic obstructive pulmonary disease, unspecified: Secondary | ICD-10-CM | POA: Diagnosis not present

## 2018-07-03 DIAGNOSIS — F17210 Nicotine dependence, cigarettes, uncomplicated: Secondary | ICD-10-CM | POA: Insufficient documentation

## 2018-07-03 DIAGNOSIS — I2510 Atherosclerotic heart disease of native coronary artery without angina pectoris: Secondary | ICD-10-CM | POA: Diagnosis not present

## 2018-07-03 DIAGNOSIS — C61 Malignant neoplasm of prostate: Secondary | ICD-10-CM

## 2018-07-03 DIAGNOSIS — Z510 Encounter for antineoplastic radiation therapy: Secondary | ICD-10-CM | POA: Diagnosis not present

## 2018-07-03 DIAGNOSIS — E785 Hyperlipidemia, unspecified: Secondary | ICD-10-CM | POA: Insufficient documentation

## 2018-07-03 DIAGNOSIS — I252 Old myocardial infarction: Secondary | ICD-10-CM | POA: Diagnosis not present

## 2018-07-03 DIAGNOSIS — I509 Heart failure, unspecified: Secondary | ICD-10-CM | POA: Insufficient documentation

## 2018-07-03 HISTORY — DX: Malignant neoplasm of prostate: C61

## 2018-07-03 HISTORY — DX: Malignant neoplasm of unspecified part of unspecified bronchus or lung: C34.90

## 2018-07-03 NOTE — Progress Notes (Signed)
See progress note under physician encounter. 

## 2018-07-03 NOTE — Telephone Encounter (Signed)
Patient late for 1030 appointment. Phoned to inquire. No answer. Left message with contact information and encouraged patient to reschedule. Awaiting return call.

## 2018-07-03 NOTE — Telephone Encounter (Signed)
Called patient to inform of appt. with Dr. Tresa Moore on 07-09-18 @ 11 am for ADT Inj., spoke with patient and he is aware of this appt.
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
## 2018-07-03 NOTE — Progress Notes (Signed)
Introduced myself to [name] as the prostate nurse navigator and my role. He does not know of any family history of prostate cancer. He was treated for lung cancer in 2017 and is doing well. Due to cardiac history is not a surgical candidate and he is not interested in surgery. He is interested in his radiation options and leaning towards radiation with seed boost. I gave him my business card and asked him to call me with questions or concerns. He voiced understanding.

## 2018-07-04 ENCOUNTER — Telehealth: Payer: Self-pay | Admitting: Cardiology

## 2018-07-04 ENCOUNTER — Telehealth: Payer: Self-pay | Admitting: *Deleted

## 2018-07-04 DIAGNOSIS — C61 Malignant neoplasm of prostate: Secondary | ICD-10-CM | POA: Insufficient documentation

## 2018-07-04 NOTE — Telephone Encounter (Signed)
° ° °  ° °  [facility] Pre-operative Risk Assessment    Request for surgical clearance:  1. What type of surgery is being performed? PROSTATE IMPLANT  2. When is this surgery scheduled? TBD  3. What type of clearance is required (medical clearance vs. Pharmacy clearance to hold med vs. Both)? BOTH  4. Are there any medications that need to be held prior to surgery and how long?  5. Practice name and name of physician performing surgery? DR MATTHEW MANNING  6. What is your office phone number [phone]   7.   What is your office fax number [phone]  8.   Anesthesia type (None, local, MAC, general) ? [place] 07/04/2018, 1:05 PM  _________________________________________________________________   (provider comments below)

## 2018-07-04 NOTE — Telephone Encounter (Signed)
CALLED PATIENT TO INFORM OF CARDIAC CLEARANCE WITH DR. Aaron Edelman CRENSHAW ON 07-12-18 - ARRIVAL TIME - 3:15 PM , ADDRESS - [place] SPOKE WITH PATIENT AND HE IS AWARE OF THIS APPT.

## 2018-07-05 ENCOUNTER — Encounter: Payer: Self-pay | Admitting: General Practice

## 2018-07-05 NOTE — Progress Notes (Signed)
[facility] CSW Progress Notes  Referred to [employer] via [place] for help w obtaining permanent stable housing.  Edwyna Shell, LCSW Clinical Social Worker Phone:  [phone]

## 2018-07-05 NOTE — Progress Notes (Signed)
[facility] Psychosocial Distress Screening Clinical Social Work  Clinical Social Work was referred by distress screening protocol.  The patient scored a 5 on the Psychosocial Distress Thermometer which indicates moderate distress. Clinical Social Worker contacted patient by phone to assess for distress and other psychosocial needs.  Living temporarily w cousin while waiting for disability to be approved - now wants to find own place but has been limited due to fatigue and current diagnosis.  Also diagnosed w CHF which also limits his ability to tolerate exercise.  Somewhat isolated at home - "its pretty much me and my dog, my cousin works second shift."  Occasionally goes for a walk, but mostly stays in apartment.  No current concerns about transportation and housing is stable unless situation w cousin changes.  CSW and patient discussed common feeling and emotions when being diagnosed with cancer, and the importance of support during treatment. CSW informed patient of the support team and support services at [facility]. CSW provided contact information and encouraged patient to call with any questions or concerns.  States "I was worried about my heart disease, but now w this cancer I've forgotten all about that."  Will mail information packet and encouraged patient to reach out for support/resources as needed.    ONCBCN DISTRESS SCREENING 07/03/2018  Screening Type Initial Screening  Distress experienced in past week (1-10) 5  Practical problem type Housing;Transportation  Emotional problem type Nervousness/Anxiety  Physical Problem type Sexual problems  Physician notified of physical symptoms Yes  Referral to clinical psychology No  Referral to clinical social work Yes  Referral to dietition No  Referral to financial advocate No  Referral to support programs No  Referral to palliative care No    Clinical Social Worker follow up needed: No.  If yes, follow up plan:  Beverely Pace, [place], LCSW Clinical Social Worker Phone:  [phone]

## 2018-07-06 NOTE — Telephone Encounter (Signed)
Would continue asa if possible [name]

## 2018-07-09 NOTE — Telephone Encounter (Signed)
Left message for patient to call back and speak to the preop APP on call.

## 2018-07-11 NOTE — Telephone Encounter (Signed)
Follow up   The patient is returning your call he can be reached at [phone].

## 2018-07-11 NOTE — Telephone Encounter (Signed)
I have called the patient, but he did not answer again. I left another message for him to call back and speak to the on call preop APP.

## 2018-07-11 NOTE — Telephone Encounter (Signed)
   Primary Cardiologist: Kirk Ruths, MD  Chart reviewed as part of pre-operative protocol coverage. Patient was contacted 07/11/2018 in reference to pre-operative risk assessment for pending surgery as outlined below.  [name] was last seen on 02/06/2018 by Dr. Stanford Breed.  Since that day, [name] has done well without any chest pain or shortness of breath.  Therefore, based on ACC/AHA guidelines, the patient would be at acceptable risk for the planned procedure without further cardiovascular testing.   I will route this recommendation to the requesting party via Epic fax function and remove from pre-op pool.  Please call with questions. Per Dr. Stanford Breed, given his past cardiac history, we recommend continue aspirin through the surgery if possible.   [place], [place] 07/11/2018, 3:00 PM

## 2018-07-12 ENCOUNTER — Telehealth: Payer: Self-pay | Admitting: Cardiology

## 2018-07-12 ENCOUNTER — Ambulatory Visit: Payer: Medicare HMO | Admitting: Cardiology

## 2018-07-12 ENCOUNTER — Encounter: Payer: Self-pay | Admitting: Urology

## 2018-07-12 NOTE — Progress Notes (Signed)
Patient was granted cardiac clearance by his cardiologist, Dr. Kirk Ruths.  He was started on ADT on 07/09/2018 and upfront brachytherapy boost is scheduled for 09/14/2018.  Approximately 3 weeks thereafter, he will proceed with 5 weeks of daily prostate IMRT.  Nicholos Johns, MMS, PA-C [place] at [place]: [phone]  Fax: [phone]

## 2018-07-12 NOTE — Telephone Encounter (Signed)
Advised pt that per Almyra Deforest, PA, he has already been cleared for surgery and does not need to come to appointment that scheduled for today 2/13 with Kerin Ransom, [place]. Pt encouraged to keep 6 mo f/u with Dr. Stanford Breed on 10/01/18 at 4 pm. Pt verbalized understanding.

## 2018-07-12 NOTE — Telephone Encounter (Signed)
° °  Patient calling to confirm if he needs to keep appointment today. Patient has already spoken with pre op team and cleared for procedure. Is there a need to keep appointment today?

## 2018-07-17 ENCOUNTER — Other Ambulatory Visit: Payer: Self-pay | Admitting: Urology

## 2018-08-08 ENCOUNTER — Telehealth: Payer: Self-pay | Admitting: *Deleted

## 2018-08-08 NOTE — Telephone Encounter (Signed)
CALLED PATIENT TO REMIND OF PRE-SEED APPTS. FOR 08-09-18, SPOKE WITH PATIENT AND HE IS AWARE OF THESE APPTS.

## 2018-08-09 ENCOUNTER — Ambulatory Visit
Admission: RE | Admit: 2018-08-09 | Discharge: 2018-08-09 | Disposition: A | Discharge status: Home / Self Care | Payer: Medicare HMO | Source: Ambulatory Visit | Attending: Urology | Admitting: Urology

## 2018-08-09 ENCOUNTER — Encounter: Payer: Self-pay | Admitting: Medical Oncology

## 2018-08-09 ENCOUNTER — Ambulatory Visit
Admission: RE | Admit: 2018-08-09 | Discharge: 2018-08-09 | Disposition: A | Discharge status: Home / Self Care | Payer: Medicare HMO | Source: Ambulatory Visit | Attending: Radiation Oncology | Admitting: Radiation Oncology

## 2018-08-09 ENCOUNTER — Other Ambulatory Visit: Payer: Self-pay

## 2018-08-09 DIAGNOSIS — J449 Chronic obstructive pulmonary disease, unspecified: Secondary | ICD-10-CM | POA: Diagnosis not present

## 2018-08-09 DIAGNOSIS — I509 Heart failure, unspecified: Secondary | ICD-10-CM | POA: Insufficient documentation

## 2018-08-09 DIAGNOSIS — E785 Hyperlipidemia, unspecified: Secondary | ICD-10-CM | POA: Insufficient documentation

## 2018-08-09 DIAGNOSIS — I2510 Atherosclerotic heart disease of native coronary artery without angina pectoris: Secondary | ICD-10-CM | POA: Diagnosis not present

## 2018-08-09 DIAGNOSIS — I252 Old myocardial infarction: Secondary | ICD-10-CM | POA: Diagnosis not present

## 2018-08-09 DIAGNOSIS — Z510 Encounter for antineoplastic radiation therapy: Secondary | ICD-10-CM | POA: Insufficient documentation

## 2018-08-09 DIAGNOSIS — C61 Malignant neoplasm of prostate: Secondary | ICD-10-CM | POA: Diagnosis not present

## 2018-08-09 DIAGNOSIS — F17210 Nicotine dependence, cigarettes, uncomplicated: Secondary | ICD-10-CM | POA: Insufficient documentation

## 2018-08-09 NOTE — Progress Notes (Signed)
  Radiation Oncology         (336) [phone] ________________________________  Name: [name] MRN: [id number]  Date: 08/09/2018  DOB: [birth year]  SIMULATION AND TREATMENT PLANNING NOTE PUBIC ARCH STUDY  [id number], Myra Rude, MD  Melrose Nakayama, *  DIAGNOSIS: 58 y.o. gentleman with Stage T1c adenocarcinoma of the prostate with Gleason score of 4+4, and PSA of 7.8.     ICD-10-CM   1. Malignant neoplasm of prostate ([place]) C61     COMPLEX SIMULATION:  The patient presented today for evaluation for possible prostate seed implant. He was brought to the radiation planning suite and placed supine on the CT couch. A 3-dimensional image study set was obtained in upload to the planning computer. There, on each axial slice, I contoured the prostate gland. Then, using three-dimensional radiation planning tools I reconstructed the prostate in view of the structures from the transperineal needle pathway to assess for possible pubic arch interference. In doing so, I did not appreciate any pubic arch interference. Also, the patient's prostate volume was estimated based on the drawn structure. The volume was 33 cc.  Given the pubic arch appearance and prostate volume, patient remains a good candidate to proceed with prostate seed implant. Today, he freely provided informed written consent to proceed.    PLAN: The patient will undergo prostate seed implant.   ________________________________  Sheral Apley. [name], M.D.

## 2018-08-14 ENCOUNTER — Encounter: Payer: Self-pay | Admitting: Urology

## 2018-08-14 DIAGNOSIS — Z95810 Presence of automatic (implantable) cardiac defibrillator: Secondary | ICD-10-CM | POA: Insufficient documentation

## 2018-08-14 NOTE — Progress Notes (Signed)
Patient is scheduled for brachytherapy boost with SpaceOAR on 09/14/2018.  He will not have an MRI prostate due to having an implantable ICD.  Approximately 3 weeks thereafter, he will be scheduled for CT simulation in preparation and planning for 5 weeks of prostate IMRT.  Nicholos Johns, MMS, PA-C [place] at [place]: [phone]  Fax: [phone]

## 2018-09-14 ENCOUNTER — Encounter (HOSPITAL_COMMUNITY): Admission: RE | Payer: Self-pay | Source: Home / Self Care

## 2018-09-14 ENCOUNTER — Ambulatory Visit (HOSPITAL_COMMUNITY): Admission: RE | Admit: 2018-09-14 | Payer: Medicare HMO | Source: Home / Self Care | Admitting: Urology

## 2018-09-14 SURGERY — INSERTION, RADIATION SOURCE, PROSTATE
Anesthesia: Choice

## 2018-09-24 ENCOUNTER — Other Ambulatory Visit: Payer: Self-pay

## 2018-09-24 ENCOUNTER — Encounter: Payer: Medicare HMO | Admitting: *Deleted

## 2018-09-25 ENCOUNTER — Telehealth: Payer: Self-pay

## 2018-09-25 NOTE — Telephone Encounter (Signed)
Left message for patient to remind of missed remote transmission.  
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
## 2018-10-01 ENCOUNTER — Telehealth: Payer: Self-pay | Admitting: Internal Medicine

## 2018-10-01 ENCOUNTER — Encounter: Payer: Self-pay | Admitting: Cardiology

## 2018-10-01 ENCOUNTER — Telehealth (INDEPENDENT_AMBULATORY_CARE_PROVIDER_SITE_OTHER): Payer: Medicare HMO | Admitting: Cardiology

## 2018-10-01 VITALS — Ht 68.0 in | Wt 125.0 lb

## 2018-10-01 DIAGNOSIS — I255 Ischemic cardiomyopathy: Secondary | ICD-10-CM

## 2018-10-01 DIAGNOSIS — I2510 Atherosclerotic heart disease of native coronary artery without angina pectoris: Secondary | ICD-10-CM | POA: Diagnosis not present

## 2018-10-01 DIAGNOSIS — E785 Hyperlipidemia, unspecified: Secondary | ICD-10-CM

## 2018-10-01 NOTE — Patient Instructions (Signed)
Medication Instructions:  NO CHANGE If you need a refill on your cardiac medications before your next appointment, please call your pharmacy.   Lab work: Your physician recommends that you return for lab work in: [place] If you have labs (blood work) drawn today and your tests are completely normal, you will receive your results only by: Marland Kitchen MyChart Message (if you have MyChart) OR . A paper copy in the mail If you have any lab test that is abnormal or we need to change your treatment, we will call you to review the results.  Follow-Up: At [facility], you and your health needs are our priority.  As part of our continuing mission to provide you with exceptional heart care, we have created designated Provider Care Teams.  These Care Teams include your primary Cardiologist (physician) and Advanced Practice Providers (APPs -  Physician Assistants and Nurse Practitioners) who all work together to provide you with the care you need, when you need it. You will need a follow up appointment in 6 months.  Please call our office 2 months in advance to schedule this appointment.  You may see Kirk Ruths, MD or one of the following Advanced Practice Providers on your designated Care Team:   Kerin Ransom, PA-C Roby Lofts, [place] . Sande Rives, PA-C

## 2018-10-01 NOTE — Telephone Encounter (Signed)
New message   [facility] for pt to call back and schedule virutal visit with Dr. Donavan Burnet.

## 2018-10-09 ENCOUNTER — Ambulatory Visit: Payer: Medicare HMO | Admitting: Cardiology

## 2018-10-10 ENCOUNTER — Other Ambulatory Visit: Payer: Self-pay | Admitting: Urology

## 2018-10-15 ENCOUNTER — Ambulatory Visit (INDEPENDENT_AMBULATORY_CARE_PROVIDER_SITE_OTHER): Payer: Medicare HMO | Admitting: *Deleted

## 2018-10-15 ENCOUNTER — Other Ambulatory Visit: Payer: Self-pay | Admitting: Urology

## 2018-10-15 ENCOUNTER — Other Ambulatory Visit: Payer: Self-pay

## 2018-10-15 DIAGNOSIS — I255 Ischemic cardiomyopathy: Secondary | ICD-10-CM

## 2018-10-15 NOTE — Progress Notes (Signed)
SPOKE W/  _Patient     SCREENING SYMPTOMS OF COVID 19:   COUGH--no  RUNNY NOSE--- no  SORE THROAT---no  NASAL CONGESTION--no--  SNEEZING----no  SHORTNESS OF BREATH---no  DIFFICULTY BREATHING--no-  TEMP >100.0 -----no  UNEXPLAINED BODY ACHES---no---  CHILLS ----no----   HEADACHES ------no---  LOSS OF SMELL/ TASTE ----no----    HAVE YOU OR ANY FAMILY MEMBER TRAVELLED PAST 14 DAYS OUT OF THE   COUNTY---yes STATE----yes [place]--no--  HAVE YOU OR ANY FAMILY MEMBER BEEN EXPOSED TO ANYONE WITH COVID 19?  no

## 2018-10-15 NOTE — Patient Instructions (Addendum)
Bilal Vandergriff   Your procedure is scheduled on: 10/24/18   Report to [facility] Main  Entrance Report to admitting at 6:30 AM   YOU NEED TO HAVE A COVID 19 TEST ON_5/22/20 at_1200 pm_____, THIS TEST MUST BE DONE BEFORE SURGERY, COME TO [place] ENTRANCE BETWEEN THE HOURS OF 900 AM AND 300 PM ON YOUR COVID TEST DATE.   Call this number if you have problems the morning of surgery [phone]    Remember: Do not eat food or drink liquids :After Midnight.           BRUSH YOUR TEETH MORNING OF SURGERY AND RINSE YOUR MOUTH OUT, NO CHEWING GUM CANDY OR MINTS.     Take these medicines the morning of surgery with A SIP OF WATER: Carvedilol (Coreg).               You may use your inhalers if needed and bring them to the hospital with you                                You may not have any metal on your body including  piercings  Do not wear jewelry, lotions, powders or, deodorant           .              Men may shave face and neck.   Do not bring valuables to the hospital. [place].  Contacts, dentures or bridgework may not be worn into surgery.      Patients discharged the day of surgery will not be allowed to drive home.             IF YOU ARE HAVING SURGERY AND GOING HOME THE SAME DAY, YOU MUST HAVE AN ADULT TO DRIVE YOU HOME AND BE             WITH YOU FOR 24 HOURS.           YOU MAY GO HOME BY TAXI OR UBER OR ORTHERWISE, BUT AN ADULT MUST ACCOMPANY YOU HOME AND STAY WITH YOU              FOR 24 HOURS.   Name and phone number of your driver:    Special Instructions: N/A              Please read over the following fact sheets you were given: _____________________________________________________________________             [facility] - Preparing for Surgery   Before surgery, you can play an important role.  Because skin is not sterile, your skin needs to be as free of germs  as possible.  You can reduce the number of germs on your skin by washing with CHG (chlorahexidine gluconate) soap before surgery.  CHG is an antiseptic cleaner which kills germs and bonds with the skin to continue killing germs even after washing. Please DO NOT use if you have an allergy to CHG or antibacterial soaps.  If your skin becomes reddened/irritated stop using the CHG and inform your nurse when you arrive at Short Stay. Do not shave (including legs and underarms) for at least 48 hours prior to the first CHG shower.  You may shave your face/neck.  Please follow these instructions carefully:  1.  Shower with CHG Soap the night before surgery and the  morning of Surgery.  2.  If you choose to wash your hair, wash your hair first as usual with your  normal  shampoo.  3.  After you shampoo, rinse your hair and body thoroughly to remove the  shampoo.                                        4.  Use CHG as you would any other liquid soap.  You can apply chg directly  to the skin and wash                       Gently with a scrungie or clean washcloth.  5.  Apply the CHG Soap to your body ONLY FROM THE NECK DOWN.   Do not use on face/ open                           Wound or open sores. Avoid contact with eyes, ears mouth and genitals (private parts).                       Wash face,  Genitals (private parts) with your normal soap.             6.  Wash thoroughly, paying special attention to the area where your surgery  will be performed.  7.  Thoroughly rinse your body with warm water from the neck down.  8.  DO NOT shower/wash with your normal soap after using and rinsing off  the CHG Soap.                9.  Pat yourself dry with a clean towel.            10.  Wear clean pajamas.            11.  Place clean sheets on your bed the night of your first shower and do not  sleep with pets . Day of Surgery : Do not apply any lotions/deodorants the morning of surgery.  Please wear clean clothes to the  hospital/surgery center.   FAILURE TO FOLLOW THESE INSTRUCTIONS MAY RESULT IN THE CANCELLATION OF YOUR SURGERY PATIENT SIGNATURE_________________________________  NURSE SIGNATURE__________________________________  ________________________________________________________________________

## 2018-10-16 ENCOUNTER — Other Ambulatory Visit: Payer: Self-pay

## 2018-10-16 ENCOUNTER — Ambulatory Visit (HOSPITAL_COMMUNITY)
Admission: RE | Admit: 2018-10-16 | Discharge: 2018-10-16 | Disposition: A | Discharge status: Home / Self Care | Payer: Medicare HMO | Source: Ambulatory Visit | Attending: Anesthesiology | Admitting: Anesthesiology

## 2018-10-16 ENCOUNTER — Encounter (HOSPITAL_COMMUNITY): Payer: Self-pay

## 2018-10-16 ENCOUNTER — Encounter (HOSPITAL_COMMUNITY)
Admission: RE | Admit: 2018-10-16 | Discharge: 2018-10-16 | Disposition: A | Discharge status: Home / Self Care | Payer: Medicare HMO | Source: Ambulatory Visit | Attending: Urology | Admitting: Urology

## 2018-10-16 DIAGNOSIS — I252 Old myocardial infarction: Secondary | ICD-10-CM | POA: Diagnosis not present

## 2018-10-16 DIAGNOSIS — I509 Heart failure, unspecified: Secondary | ICD-10-CM | POA: Insufficient documentation

## 2018-10-16 DIAGNOSIS — I2510 Atherosclerotic heart disease of native coronary artery without angina pectoris: Secondary | ICD-10-CM | POA: Diagnosis not present

## 2018-10-16 DIAGNOSIS — Z79899 Other long term (current) drug therapy: Secondary | ICD-10-CM | POA: Insufficient documentation

## 2018-10-16 DIAGNOSIS — Z7982 Long term (current) use of aspirin: Secondary | ICD-10-CM | POA: Insufficient documentation

## 2018-10-16 DIAGNOSIS — I255 Ischemic cardiomyopathy: Secondary | ICD-10-CM | POA: Insufficient documentation

## 2018-10-16 DIAGNOSIS — Z95810 Presence of automatic (implantable) cardiac defibrillator: Secondary | ICD-10-CM | POA: Insufficient documentation

## 2018-10-16 DIAGNOSIS — J449 Chronic obstructive pulmonary disease, unspecified: Secondary | ICD-10-CM | POA: Diagnosis not present

## 2018-10-16 DIAGNOSIS — E785 Hyperlipidemia, unspecified: Secondary | ICD-10-CM | POA: Diagnosis not present

## 2018-10-16 DIAGNOSIS — C61 Malignant neoplasm of prostate: Secondary | ICD-10-CM | POA: Diagnosis not present

## 2018-10-16 DIAGNOSIS — Z9981 Dependence on supplemental oxygen: Secondary | ICD-10-CM | POA: Diagnosis not present

## 2018-10-16 DIAGNOSIS — Z85118 Personal history of other malignant neoplasm of bronchus and lung: Secondary | ICD-10-CM | POA: Diagnosis not present

## 2018-10-16 DIAGNOSIS — F17210 Nicotine dependence, cigarettes, uncomplicated: Secondary | ICD-10-CM | POA: Insufficient documentation

## 2018-10-16 DIAGNOSIS — Z01811 Encounter for preprocedural respiratory examination: Secondary | ICD-10-CM

## 2018-10-16 DIAGNOSIS — Z951 Presence of aortocoronary bypass graft: Secondary | ICD-10-CM | POA: Insufficient documentation

## 2018-10-16 DIAGNOSIS — Z923 Personal history of irradiation: Secondary | ICD-10-CM | POA: Insufficient documentation

## 2018-10-16 DIAGNOSIS — Z7951 Long term (current) use of inhaled steroids: Secondary | ICD-10-CM | POA: Diagnosis not present

## 2018-10-16 LAB — COMPREHENSIVE METABOLIC PANEL
ALT: 31 U/L (ref 0–44)
AST: 28 U/L (ref 15–41)
Albumin: 3.8 g/dL (ref 3.5–5.0)
Alkaline Phosphatase: 53 U/L (ref 38–126)
Anion gap: 6 (ref 5–15)
BUN: 20 mg/dL (ref 6–20)
CO2: 30 mmol/L (ref 22–32)
Calcium: 9.7 mg/dL (ref 8.9–10.3)
Chloride: 101 mmol/L (ref 98–111)
Creatinine, Ser: 0.82 mg/dL (ref 0.61–1.24)
GFR calc Af Amer: >60 mL/min (ref >60)
GFR calc non Af Amer: >60 mL/min (ref >60)
Glucose, Bld: 104 mg/dL — ABNORMAL HIGH (ref 70–99)
Potassium: 5.8 mmol/L — ABNORMAL HIGH (ref 3.5–5.1)
Sodium: 137 mmol/L (ref 135–145)
Total Bilirubin: 0.4 mg/dL (ref 0.3–1.2)
Total Protein: 9.1 g/dL — ABNORMAL HIGH (ref 6.5–8.1)

## 2018-10-16 LAB — CBC
HCT: 36 % — ABNORMAL LOW (ref 39.0–52.0)
Hemoglobin: 11.4 g/dL — ABNORMAL LOW (ref 13.0–17.0)
MCH: 28.9 pg (ref 26.0–34.0)
MCHC: 31.7 g/dL (ref 30.0–36.0)
MCV: 91.1 fL (ref 80.0–100.0)
Platelets: 384 10*3/uL (ref 150–400)
RBC: 3.95 MIL/uL — ABNORMAL LOW (ref 4.22–5.81)
RDW: 13.6 % (ref 11.5–15.5)
WBC: 6.9 10*3/uL (ref 4.0–10.5)
nRBC: 0 % (ref 0.0–0.2)

## 2018-10-16 LAB — CUP PACEART REMOTE DEVICE CHECK
Date Time Interrogation Session: 20200519110715
Implantable Lead Implant Date: 20140929
Implantable Lead Location: 753860
Implantable Lead Model: 181
Implantable Lead Serial Number: 325827
Implantable Pulse Generator Implant Date: 20140929
Pulse Gen Serial Number: 7132440

## 2018-10-16 LAB — PROTIME-INR
INR: 1.1 (ref 0.8–1.2)
Prothrombin Time: 13.8 seconds (ref 11.4–15.2)

## 2018-10-16 LAB — APTT: aPTT: 51 seconds — ABNORMAL HIGH (ref 24–36)

## 2018-10-16 NOTE — Progress Notes (Addendum)
   Garrett Felix PA  YME:BRAXE Bland  CARDIOLOGIST:Brian Crenshaw  INFO IN Epic:Card. Clearance,ECHO,CXR, EKG  INFO ON CHART:card. clear  BLOOD THINNERS AND LAST DOSES:ASA 5/15 /20 ____________________________________  PATIENT SYMPTOMS AT TIME OF PREOP:no  Please review patients CMP,CBC,PTT   Pt uses home O2  Sometimes at night.

## 2018-10-17 NOTE — Anesthesia Preprocedure Evaluation (Addendum)
Anesthesia Evaluation  Patient identified by MRN, date of birth, ID band Patient awake    Reviewed: Allergy & Precautions, NPO status , Patient's Chart, lab work & pertinent test results  Airway Mallampati: II  TM Distance: >3 FB Neck ROM: Full    Dental no notable dental hx.    Pulmonary COPD, Current Smoker,    Pulmonary exam normal breath sounds clear to auscultation       Cardiovascular + CAD, + Past MI, + Cardiac Stents, + CABG and +CHF  Normal cardiovascular exam+ Cardiac Defibrillator  Rhythm:Regular Rate:Normal  Pt was last seen by cardiologist, Dr. Kirk Ruths, 10/01/2018   Neuro/Psych  Headaches, negative psych ROS   GI/Hepatic negative GI ROS, Neg liver ROS,   Endo/Other  negative endocrine ROS  Renal/GU negative Renal ROS     Musculoskeletal negative musculoskeletal ROS (+)   Abdominal   Peds  Hematology  (+) anemia , HLD   Anesthesia Other Findings PROSTATE CANCER  Reproductive/Obstetrics                           Anesthesia Physical Anesthesia Plan  ASA: III  Anesthesia Plan: General   Post-op Pain Management:    Induction: Intravenous  PONV Risk Score and Plan: 1 and Ondansetron, Dexamethasone, Midazolam and Treatment may vary due to age or medical condition  Airway Management Planned: Oral ETT  Additional Equipment:   Intra-op Plan:   Post-operative Plan: Extubation in OR  Informed Consent: I have reviewed the patients History and Physical, chart, labs and discussed the procedure including the risks, benefits and alternatives for the proposed anesthesia with the patient or authorized representative who has indicated his/her understanding and acceptance.     Dental advisory given  Plan Discussed with: CRNA  Anesthesia Plan Comments: (Reviewed PAT note 10/16/18, Konrad Felix, PA-C)      Anesthesia Quick Evaluation
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
## 2018-10-19 ENCOUNTER — Other Ambulatory Visit (HOSPITAL_COMMUNITY)
Admission: RE | Admit: 2018-10-19 | Discharge: 2018-10-19 | Disposition: A | Discharge status: Home / Self Care | Payer: Medicare HMO | Source: Ambulatory Visit | Attending: Anesthesiology | Admitting: Anesthesiology

## 2018-10-19 DIAGNOSIS — Z1159 Encounter for screening for other viral diseases: Secondary | ICD-10-CM | POA: Insufficient documentation

## 2018-10-20 LAB — NOVEL CORONAVIRUS, NAA (HOSP ORDER, SEND-OUT TO REF LAB; TAT 18-24 HRS): SARS-CoV-2, NAA: NOT DETECTED

## 2018-10-23 ENCOUNTER — Telehealth: Payer: Self-pay | Admitting: *Deleted

## 2018-10-23 NOTE — Progress Notes (Signed)
Called for covid screening, no VM available to leave message.

## 2018-10-23 NOTE — Telephone Encounter (Signed)
CALLED PATIENT TO REMIND OF IMPLANT FOR 10-24-18, SPOKE WITH PATIENT AND HE IS AWARE OF THIS PROCEDURE.

## 2018-10-23 NOTE — Progress Notes (Signed)
SPOKE With patient via phone     [place] [facility]:   COUGH--yes  (Seasonal allergies and COPD) RUNNY NOSE--- YES (Seasonal allergies )  SORE THROAT---NO  NASAL CONGESTION----NO  SNEEZING----NO  SHORTNESS OF BREATH---NO  DIFFICULTY BREATHING---NO  TEMP >100.0 -----NO  UNEXPLAINED BODY ACHES------NO  CHILLS -------- NO  HEADACHES ---------NO  LOSS OF SMELL/ TASTE --------NO    HAVE YOU OR ANY FAMILY MEMBER TRAVELLED PAST 14 DAYS OUT OF THE   COUNTY---YES [facility]) STATE----NO COUNTRY----NO  HAVE YOU OR ANY FAMILY MEMBER BEEN EXPOSED TO ANYONE WITH COVID 19? NO

## 2018-10-24 ENCOUNTER — Ambulatory Visit (HOSPITAL_COMMUNITY)
Admission: RE | Admit: 2018-10-24 | Discharge: 2018-10-24 | Disposition: A | Discharge status: Home / Self Care | Payer: Medicare HMO | Source: Other Acute Inpatient Hospital | Attending: Urology | Admitting: Urology

## 2018-10-24 ENCOUNTER — Ambulatory Visit (HOSPITAL_COMMUNITY): Payer: Medicare HMO | Admitting: Physician Assistant

## 2018-10-24 ENCOUNTER — Encounter (HOSPITAL_COMMUNITY)
Admission: RE | Disposition: A | Discharge status: Home / Self Care | Payer: Self-pay | Source: Other Acute Inpatient Hospital | Attending: Urology

## 2018-10-24 ENCOUNTER — Encounter: Payer: Self-pay | Admitting: Medical Oncology

## 2018-10-24 ENCOUNTER — Ambulatory Visit (HOSPITAL_COMMUNITY): Payer: Medicare HMO

## 2018-10-24 ENCOUNTER — Encounter (HOSPITAL_COMMUNITY): Payer: Self-pay | Admitting: *Deleted

## 2018-10-24 ENCOUNTER — Ambulatory Visit (HOSPITAL_COMMUNITY): Payer: Medicare HMO | Admitting: Registered Nurse

## 2018-10-24 DIAGNOSIS — Z951 Presence of aortocoronary bypass graft: Secondary | ICD-10-CM | POA: Insufficient documentation

## 2018-10-24 DIAGNOSIS — E785 Hyperlipidemia, unspecified: Secondary | ICD-10-CM | POA: Insufficient documentation

## 2018-10-24 DIAGNOSIS — Z955 Presence of coronary angioplasty implant and graft: Secondary | ICD-10-CM | POA: Insufficient documentation

## 2018-10-24 DIAGNOSIS — I509 Heart failure, unspecified: Secondary | ICD-10-CM | POA: Insufficient documentation

## 2018-10-24 DIAGNOSIS — Z923 Personal history of irradiation: Secondary | ICD-10-CM | POA: Insufficient documentation

## 2018-10-24 DIAGNOSIS — Z7951 Long term (current) use of inhaled steroids: Secondary | ICD-10-CM | POA: Insufficient documentation

## 2018-10-24 DIAGNOSIS — F17210 Nicotine dependence, cigarettes, uncomplicated: Secondary | ICD-10-CM | POA: Insufficient documentation

## 2018-10-24 DIAGNOSIS — C61 Malignant neoplasm of prostate: Secondary | ICD-10-CM | POA: Insufficient documentation

## 2018-10-24 DIAGNOSIS — I255 Ischemic cardiomyopathy: Secondary | ICD-10-CM | POA: Insufficient documentation

## 2018-10-24 DIAGNOSIS — J449 Chronic obstructive pulmonary disease, unspecified: Secondary | ICD-10-CM | POA: Insufficient documentation

## 2018-10-24 DIAGNOSIS — Z79899 Other long term (current) drug therapy: Secondary | ICD-10-CM | POA: Insufficient documentation

## 2018-10-24 DIAGNOSIS — I110 Hypertensive heart disease with heart failure: Secondary | ICD-10-CM | POA: Insufficient documentation

## 2018-10-24 DIAGNOSIS — I252 Old myocardial infarction: Secondary | ICD-10-CM | POA: Diagnosis not present

## 2018-10-24 DIAGNOSIS — Z85118 Personal history of other malignant neoplasm of bronchus and lung: Secondary | ICD-10-CM | POA: Diagnosis not present

## 2018-10-24 DIAGNOSIS — Z95810 Presence of automatic (implantable) cardiac defibrillator: Secondary | ICD-10-CM | POA: Insufficient documentation

## 2018-10-24 DIAGNOSIS — I2510 Atherosclerotic heart disease of native coronary artery without angina pectoris: Secondary | ICD-10-CM | POA: Insufficient documentation

## 2018-10-24 HISTORY — PX: SPACE OAR INSTILLATION: SHX6769

## 2018-10-24 HISTORY — PX: RADIOACTIVE SEED IMPLANT: SHX5150

## 2018-10-24 SURGERY — INSERTION, RADIATION SOURCE, PROSTATE
Anesthesia: General

## 2018-10-24 MED ORDER — SUGAMMADEX SODIUM 200 MG/2ML IV SOLN
INTRAVENOUS | Status: DC | PRN
  Administered 2018-10-24: 120 mg via INTRAVENOUS

## 2018-10-24 MED ORDER — ONDANSETRON HCL 4 MG/2ML IJ SOLN
INTRAMUSCULAR | Status: AC
  Filled 2018-10-24: qty 2

## 2018-10-24 MED ORDER — MIDAZOLAM HCL 2 MG/2ML IJ SOLN
INTRAMUSCULAR | Status: AC
  Filled 2018-10-24: qty 2

## 2018-10-24 MED ORDER — STERILE WATER FOR IRRIGATION IR SOLN
Status: DC | PRN
  Administered 2018-10-24: 1000 mL

## 2018-10-24 MED ORDER — STERILE WATER FOR IRRIGATION IR SOLN
Status: DC | PRN
  Administered 2018-10-24: 500 mL

## 2018-10-24 MED ORDER — LACTATED RINGERS IV SOLN
INTRAVENOUS | Status: DC
  Administered 2018-10-24: 07:00:00 via INTRAVENOUS

## 2018-10-24 MED ORDER — LIDOCAINE 2% (20 MG/ML) 5 ML SYRINGE
INTRAMUSCULAR | Status: DC | PRN
  Administered 2018-10-24: 60 mg via INTRAVENOUS

## 2018-10-24 MED ORDER — EPHEDRINE 5 MG/ML INJ
INTRAVENOUS | Status: AC
  Filled 2018-10-24: qty 10

## 2018-10-24 MED ORDER — LIDOCAINE 2% (20 MG/ML) 5 ML SYRINGE
INTRAMUSCULAR | Status: AC
  Filled 2018-10-24: qty 5

## 2018-10-24 MED ORDER — ROCURONIUM BROMIDE 10 MG/ML (PF) SYRINGE
PREFILLED_SYRINGE | INTRAVENOUS | Status: AC
  Filled 2018-10-24: qty 10

## 2018-10-24 MED ORDER — FENTANYL CITRATE (PF) 100 MCG/2ML IJ SOLN
INTRAMUSCULAR | Status: DC | PRN
  Administered 2018-10-24: 50 ug via INTRAVENOUS

## 2018-10-24 MED ORDER — PHENYLEPHRINE 40 MCG/ML (10ML) SYRINGE FOR IV PUSH (FOR BLOOD PRESSURE SUPPORT)
PREFILLED_SYRINGE | INTRAVENOUS | Status: DC | PRN
  Administered 2018-10-24: 200 ug via INTRAVENOUS

## 2018-10-24 MED ORDER — OXYCODONE HCL 5 MG PO TABS: 5.0000 mg | ORAL_TABLET | Freq: Once | ORAL | Status: DC | PRN

## 2018-10-24 MED ORDER — PROPOFOL 10 MG/ML IV BOLUS
INTRAVENOUS | Status: AC
  Filled 2018-10-24: qty 20

## 2018-10-24 MED ORDER — ROCURONIUM BROMIDE 10 MG/ML (PF) SYRINGE
PREFILLED_SYRINGE | INTRAVENOUS | Status: DC | PRN
  Administered 2018-10-24: 10 mg via INTRAVENOUS
  Administered 2018-10-24: 40 mg via INTRAVENOUS

## 2018-10-24 MED ORDER — CIPROFLOXACIN IN D5W 400 MG/200ML IV SOLN
400.0000 mg | INTRAVENOUS | Status: AC
  Administered 2018-10-24: 09:00:00 400 mg via INTRAVENOUS
  Filled 2018-10-24: qty 200

## 2018-10-24 MED ORDER — HYDROMORPHONE HCL 1 MG/ML IJ SOLN: 0.2500 mg | INTRAMUSCULAR | Status: DC | PRN

## 2018-10-24 MED ORDER — FENTANYL CITRATE (PF) 100 MCG/2ML IJ SOLN
INTRAMUSCULAR | Status: AC
  Filled 2018-10-24: qty 2

## 2018-10-24 MED ORDER — PROPOFOL 10 MG/ML IV BOLUS
INTRAVENOUS | Status: DC | PRN
  Administered 2018-10-24: 150 mg via INTRAVENOUS

## 2018-10-24 MED ORDER — SODIUM CHLORIDE 0.9 % IV SOLN
INTRAVENOUS | Status: DC | PRN
  Administered 2018-10-24: 09:00:00 15 ug/min via INTRAVENOUS

## 2018-10-24 MED ORDER — FLEET ENEMA 7-19 GM/118ML RE ENEM
1.0000 | ENEMA | Freq: Once | RECTAL | Status: DC
  Filled 2018-10-24: qty 1

## 2018-10-24 MED ORDER — SODIUM CHLORIDE (PF) 0.9 % IJ SOLN
INTRAMUSCULAR | Status: AC
  Filled 2018-10-24: qty 10

## 2018-10-24 MED ORDER — IOHEXOL 300 MG/ML  SOLN
INTRAMUSCULAR | Status: DC | PRN
  Administered 2018-10-24: 5 mL

## 2018-10-24 MED ORDER — ONDANSETRON HCL 4 MG/2ML IJ SOLN
INTRAMUSCULAR | Status: DC | PRN
  Administered 2018-10-24: 4 mg via INTRAVENOUS

## 2018-10-24 MED ORDER — TAMSULOSIN HCL 0.4 MG PO CAPS: 0.4000 mg | ORAL_CAPSULE | Freq: Every day | ORAL | 3 refills | Status: DC | PRN

## 2018-10-24 MED ORDER — TRAMADOL HCL 50 MG PO TABS: 50.0000 mg | ORAL_TABLET | Freq: Four times a day (QID) | ORAL | 0 refills | Status: DC | PRN

## 2018-10-24 MED ORDER — CARVEDILOL 6.25 MG PO TABS
6.2500 mg | ORAL_TABLET | Freq: Once | ORAL | Status: AC
  Administered 2018-10-24: 07:00:00 6.25 mg via ORAL
  Filled 2018-10-24: qty 1

## 2018-10-24 MED ORDER — PROMETHAZINE HCL 25 MG/ML IJ SOLN: 6.2500 mg | INTRAMUSCULAR | Status: DC | PRN

## 2018-10-24 MED ORDER — SODIUM CHLORIDE FLUSH 0.9 % IV SOLN
INTRAVENOUS | Status: DC | PRN
  Administered 2018-10-24: 10 mL

## 2018-10-24 MED ORDER — OXYCODONE HCL 5 MG/5ML PO SOLN: 5.0000 mg | Freq: Once | ORAL | Status: DC | PRN

## 2018-10-24 MED ORDER — SUGAMMADEX SODIUM 200 MG/2ML IV SOLN
INTRAVENOUS | Status: AC
  Filled 2018-10-24: qty 2

## 2018-10-24 MED ORDER — DEXAMETHASONE SODIUM PHOSPHATE 10 MG/ML IJ SOLN
INTRAMUSCULAR | Status: DC | PRN
  Administered 2018-10-24: 4 mg via INTRAVENOUS

## 2018-10-24 MED ORDER — MIDAZOLAM HCL 5 MG/5ML IJ SOLN
INTRAMUSCULAR | Status: DC | PRN
  Administered 2018-10-24: 1 mg via INTRAVENOUS

## 2018-10-24 MED ORDER — ACETAMINOPHEN 500 MG PO TABS
1000.0000 mg | ORAL_TABLET | Freq: Once | ORAL | Status: AC
  Administered 2018-10-24: 1000 mg via ORAL
  Filled 2018-10-24: qty 2

## 2018-10-24 MED ORDER — PHENYLEPHRINE 40 MCG/ML (10ML) SYRINGE FOR IV PUSH (FOR BLOOD PRESSURE SUPPORT)
PREFILLED_SYRINGE | INTRAVENOUS | Status: AC
  Filled 2018-10-24: qty 10

## 2018-10-24 MED ORDER — EPHEDRINE SULFATE-NACL 50-0.9 MG/10ML-% IV SOSY
PREFILLED_SYRINGE | INTRAVENOUS | Status: DC | PRN
  Administered 2018-10-24: 5 mg via INTRAVENOUS
  Administered 2018-10-24: 20 mg via INTRAVENOUS

## 2018-10-24 MED ORDER — PHENYLEPHRINE HCL (PRESSORS) 10 MG/ML IV SOLN
INTRAVENOUS | Status: AC
  Filled 2018-10-24: qty 2

## 2018-10-24 SURGICAL SUPPLY — 25 items
BAG URINE DRAINAGE (UROLOGICAL SUPPLIES) ×3 IMPLANT
CATH FOLEY 2WAY SLVR  5CC 16FR (CATHETERS) ×2
CATH FOLEY 2WAY SLVR 5CC 16FR (CATHETERS) ×1 IMPLANT
CATH ROBINSON RED A/P 20FR (CATHETERS) ×3 IMPLANT
CONT SPEC 4OZ CLIKSEAL STRL BL (MISCELLANEOUS) ×6 IMPLANT
COVER MAYO STAND STRL (DRAPES) ×6 IMPLANT
COVER WAND RF STERILE (DRAPES) IMPLANT
DRSG PAD ABDOMINAL 8X10 ST (GAUZE/BANDAGES/DRESSINGS) IMPLANT
DRSG TEGADERM 4X4.75 (GAUZE/BANDAGES/DRESSINGS) ×3 IMPLANT
DRSG TEGADERM 8X12 (GAUZE/BANDAGES/DRESSINGS) ×3 IMPLANT
GLOVE BIO SURGEON STRL SZ7.5 (GLOVE) ×3 IMPLANT
GLOVE BIOGEL M STRL SZ7.5 (GLOVE) ×6 IMPLANT
GLOVE ECLIPSE 8.0 STRL XLNG CF (GLOVE) ×3 IMPLANT
GOWN STRL REUS W/TWL LRG LVL3 (GOWN DISPOSABLE) ×6 IMPLANT
HOLDER FOLEY CATH W/STRAP (MISCELLANEOUS) IMPLANT
IMPL SPACEOAR SYSTEM 10ML (Spacer) ×1 IMPLANT
IMPLANT SPACEOAR SYSTEM 10ML (Spacer) ×3 IMPLANT
KIT TURNOVER KIT A (KITS) IMPLANT
MARKER SKIN DUAL TIP RULER LAB (MISCELLANEOUS) ×3 IMPLANT
PACK CYSTO (CUSTOM PROCEDURE TRAY) ×3 IMPLANT
Radionuclide Brachytherapy Source.  I-Seed Agx100 ×3 IMPLANT
SURGILUBE 2OZ TUBE FLIPTOP (MISCELLANEOUS) ×3 IMPLANT
SYR 10ML LL (SYRINGE) ×3 IMPLANT
TOWEL OR 17X26 10 PK STRL BLUE (TOWEL DISPOSABLE) ×3 IMPLANT
UNDERPAD 30X30 (UNDERPADS AND DIAPERS) ×3 IMPLANT
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
## 2018-10-24 NOTE — Progress Notes (Signed)
  Radiation Oncology         [phone] ________________________________  Name: [name] MRN: [id number]  Date: 10/24/2018  DOB: 06/07/1960       Prostate Seed Implant  XU:XYBFX, Myra Rude, MD  No ref. provider found  DIAGNOSIS: 58 y.o. gentleman with Stage T1c adenocarcinoma of the prostate with Gleason score of 4+4, and PSA of 7.8  PROCEDURE: Insertion of radioactive I-125 seeds into the prostate gland.  RADIATION DOSE: 110 Gy, boost therapy.  TECHNIQUE: [name] was brought to the operating room with the urologist. He was placed in the dorsolithotomy position. He was catheterized and a rectal tube was inserted. The perineum was shaved, prepped and draped. The ultrasound probe was then introduced into the rectum to see the prostate gland.  TREATMENT DEVICE: A needle grid was attached to the ultrasound probe stand and anchor needles were placed.  3D PLANNING: The prostate was imaged in 3D using a sagittal sweep of the prostate probe. These images were transferred to the planning computer. There, the prostate, urethra and rectum were defined on each axial reconstructed image. Then, the software created an optimized 3D plan and a few seed positions were adjusted. The quality of the plan was reviewed using [facility] information for the target and the following two organs at risk:  Urethra and Rectum.  Then the accepted plan was printed and handed off to the radiation therapist.  Under my supervision, the custom loading of the seeds and spacers was carried out and loaded into sealed vicryl sleeves.  These pre-loaded needles were then placed into the needle holder.Marland Kitchen  PROSTATE VOLUME STUDY:  Using transrectal ultrasound the volume of the prostate was verified to be 33.6 cc.  SPECIAL TREATMENT PROCEDURE/SUPERVISION AND HANDLING: The pre-loaded needles were then delivered under sagittal guidance. A total of 25 needles were used to deposit 69 seeds in the prostate gland. The individual seed activity  was 0.303 mCi.  SpaceOAR:  Yes  COMPLEX SIMULATION: At the end of the procedure, an anterior radiograph of the pelvis was obtained to document seed positioning and count. Cystoscopy was performed to check the urethra and bladder.  MICRODOSIMETRY: At the end of the procedure, the patient was emitting 0.25 mR/hr at 1 meter. Accordingly, he was considered safe for hospital discharge.  PLAN: The patient will return to the radiation oncology clinic for post implant CT dosimetry in three weeks.   ________________________________  Sheral Apley [name], M.D.

## 2018-10-24 NOTE — Transfer of Care (Signed)
Immediate Anesthesia Transfer of Care Note  Patient: [name]  Procedure(s) Performed: RADIOACTIVE SEED IMPLANT/BRACHYTHERAPY IMPLANT (N/A ) SPACE OAR INSTILLATION (N/A )  Patient Location: PACU  Anesthesia Type:General  Level of Consciousness: awake, alert , oriented and patient cooperative  Airway & Oxygen Therapy: Patient Spontanous Breathing and Patient connected to face mask oxygen  Post-op Assessment: Report given to RN, Post -op Vital signs reviewed and stable and Patient moving all extremities  Post vital signs: Reviewed and stable  Last Vitals:  Vitals Value Taken Time  BP 107/73 10/24/2018 10:10 AM  Temp    Pulse 72 10/24/2018 10:11 AM  Resp 16 10/24/2018 10:11 AM  SpO2 100 % 10/24/2018 10:11 AM  Vitals shown include unvalidated device data.  Last Pain:  Vitals:   10/24/18 0637  TempSrc: Oral         Complications: No apparent anesthesia complications

## 2018-10-24 NOTE — Discharge Instructions (Signed)
1 - You may have urinary urgency (bladder spasms) and bloody urine on / off for up to 2 weeks. This is normal.  2 - Call MD or go to ER for fever >102, severe pain / nausea / vomiting not relieved by medications, or acute change in medical status   General Anesthesia, Adult, Care After This sheet gives you information about how to care for yourself after your procedure. Your health care provider may also give you more specific instructions. If you have problems or questions, contact your health care provider. What can I expect after the procedure? After the procedure, the following side effects are common:  Pain or discomfort at the IV site.  Nausea.  Vomiting.  Sore throat.  Trouble concentrating.  Feeling cold or chills.  Weak or tired.  Sleepiness and fatigue.  Soreness and body aches. These side effects can affect parts of the body that were not involved in surgery. Follow these instructions at home:  For at least 24 hours after the procedure:  Have a responsible adult stay with you. It is important to have someone help care for you until you are awake and alert.  Rest as needed.  Do not: ? Participate in activities in which you could fall or become injured. ? Drive. ? Use heavy machinery. ? Drink alcohol. ? Take sleeping pills or medicines that cause drowsiness. ? Make important decisions or sign legal documents. ? Take care of children on your own. Eating and drinking  Follow any instructions from your health care provider about eating or drinking restrictions.  When you feel hungry, start by eating small amounts of foods that are soft and easy to digest (bland), such as toast. Gradually return to your regular diet.  Drink enough fluid to keep your urine pale yellow.  If you vomit, rehydrate by drinking water, juice, or clear broth. General instructions  If you have sleep apnea, surgery and certain medicines can increase your risk for breathing problems.  Follow instructions from your health care provider about wearing your sleep device: ? Anytime you are sleeping, including during daytime naps. ? While taking prescription pain medicines, sleeping medicines, or medicines that make you drowsy.  Return to your normal activities as told by your health care provider. Ask your health care provider what activities are safe for you.  Take over-the-counter and prescription medicines only as told by your health care provider.  If you smoke, do not smoke without supervision.  Keep all follow-up visits as told by your health care provider. This is important. Contact a health care provider if:  You have nausea or vomiting that does not get better with medicine.  You cannot eat or drink without vomiting.  You have pain that does not get better with medicine.  You are unable to pass urine.  You develop a skin rash.  You have a fever.  You have redness around your IV site that gets worse. Get help right away if:  You have difficulty breathing.  You have chest pain.  You have blood in your urine or stool, or you vomit blood. Summary  After the procedure, it is common to have a sore throat or nausea. It is also common to feel tired.  Have a responsible adult stay with you for the first 24 hours after general anesthesia. It is important to have someone help care for you until you are awake and alert.  When you feel hungry, start by eating small amounts of foods that are  soft and easy to digest (bland), such as toast. Gradually return to your regular diet.  Drink enough fluid to keep your urine pale yellow.  Return to your normal activities as told by your health care provider. Ask your health care provider what activities are safe for you. This information is not intended to replace advice given to you by your health care provider. Make sure you discuss any questions you have with your health care provider. Document Released: 08/22/2000  Document Revised: 12/30/2016 Document Reviewed: 12/30/2016 Elsevier Interactive Patient Education  2019 [employer].

## 2018-10-24 NOTE — Op Note (Signed)
NAMEClarnce, [name] [facility] MEDICAL RECORD [id number] ACCOUNT [id number] DATE OF BIRTH:[birth year] FACILITY: [facility] LOCATION: [facility] PHYSICIAN:Garrett Ontko, MD  OPERATIVE REPORT  DATE OF PROCEDURE:  10/24/2018  PREOPERATIVE DIAGNOSIS:  High risk prostate cancer.  PROCEDURE: 1.  Brachytherapy prostate seed implantation. 2.  Cystoscopy. 3.  SpaceOAR gel matrix implantation.  ESTIMATED BLOOD LOSS:  Nil.  COMPLICATIONS:  None.  SPECIMENS:  None.  FINDINGS: 1.  Unremarkable bladder and prostate following prostate brachytherapy. 2.  Excellent placement of 10 mL of SpaceOAR gel matrix in prerectal location.  RADIATION PARAMETERS:  110 Gy delivered over 25 needles and 69 seeds.  INDICATION:  The patient is a pleasant but oncologically comorbid 58 year old man with history of multiple prior cancers including lung cancer.  He was found on workup of elevated PSA to have multifocal high-risk prostate cancer as well.  Given the young  age and localized disease on staging imaging, it was clearly felt that primary therapy is warranted, but given his comorbidity, he understandably has opted for ablative therapy with radiation.  He underwent hormone dosing several months ago.  He now  presents for brachytherapy portion of his radiation in conjunction with [name] radiation oncology.  Informed consent was obtained and placed in the medical record.  DESCRIPTION OF PROCEDURE:  The patient being identified, the procedure being brachytherapy seed implantation, cystoscopy and SpaceOAR was confirmed.  Procedure timeout was performed.  IV antibiotics were administered.  General anesthesia was induced.   The patient was placed into a medium lithotomy position.  A sterile field was created, prepped and draped the base of the penis, perineum, proximal thighs using iodine.  Foley catheter was placed free to straight drain.  Initial radiation planning.   Intraoperative ultrasound was performed as per  separate radiation oncology note.  After planning, performed 25 needles were delivered as per radiation plan, delivering a total of 69 seeds for a total prescribed dose of 110 Gy.  It was performed in an  anterior, posterior direction.  After placement of brachytherapy seeds, the ultrasound stepper was taken off of anterior pressure to better develop perirectal space, and the SpaceOAR introducer needle was advanced transperineally into the perirectal  space at the junction of the base of the prostate and seminal vesicles.  Two mL of sterile water was injected to verify the needle placement, which appeared excellent.  Next, 10 mL of a SpaceOAR gel matrix was placed over 10 seconds as per manufacturer's  guidelines, which resulted in excellent displacement of the anterior rectal wall posteriorly.  Next, the in situ Foley catheter was removed.  Cystourethroscopy was performed with a 16-French flexible cystoscope.  This revealed no evidence of  intraluminal seeds or significant prostate, bladder, or urethral trauma.  The bladder was partially filled per cystoscope on purpose.  The catheter was not replaced.  Anesthesia was terminated.  The patient tolerated the procedure well.  No immediate  perioperative complications.  The patient was taken to postanesthesia care in stable condition with plan for trial of void and discharge home.  LN/NUANCE  D:10/24/2018 T:10/24/2018 JOB:[id number]

## 2018-10-24 NOTE — Anesthesia Procedure Notes (Signed)

## 2018-10-24 NOTE — Brief Op Note (Signed)
10/24/2018  9:56 AM  PATIENT:  [name]  58 y.o. male  PRE-OPERATIVE DIAGNOSIS:  PROSTATE CANCER  POST-OPERATIVE DIAGNOSIS:  PROSTATE CANCER  PROCEDURE:  Procedure(s) with comments: RADIOACTIVE SEED IMPLANT/BRACHYTHERAPY IMPLANT (N/A) - 90 MINS SPACE OAR INSTILLATION (N/A)  SURGEON:  Surgeon(s) and Role:    * Alexis Frock, MD - Primary    * Tyler Pita, MD - Assisting  PHYSICIAN ASSISTANT:   ASSISTANTS: none   ANESTHESIA:   general  EBL:  minimal   BLOOD ADMINISTERED:none  DRAINS: none   LOCAL MEDICATIONS USED:  NONE  SPECIMEN:  No Specimen  DISPOSITION OF SPECIMEN:  N/A  COUNTS:  YES  TOURNIQUET:  * No tourniquets in log *  DICTATION: .Other Dictation: Dictation Number [id number]  PLAN OF CARE: Discharge to home after PACU  PATIENT DISPOSITION:  PACU - hemodynamically stable.   Delay start of Pharmacological VTE agent (>24hrs) due to surgical blood loss or risk of bleeding: yes

## 2018-10-24 NOTE — Anesthesia Postprocedure Evaluation (Signed)
Anesthesia Post Note  Patient: [name]  Procedure(s) Performed: RADIOACTIVE SEED IMPLANT/BRACHYTHERAPY IMPLANT (N/A ) SPACE OAR INSTILLATION (N/A )     Patient location during evaluation: PACU Anesthesia Type: General Level of consciousness: awake and alert Pain management: pain level controlled Vital Signs Assessment: post-procedure vital signs reviewed and stable Respiratory status: spontaneous breathing, nonlabored ventilation, respiratory function stable and patient connected to nasal cannula oxygen Cardiovascular status: blood pressure returned to baseline and stable Postop Assessment: no apparent nausea or vomiting Anesthetic complications: no    Last Vitals:  Vitals:   10/24/18 1100 10/24/18 1115  BP: 119/69 109/74  Pulse: 61 63  Resp: (!) 21 13  Temp:  36.4 C  SpO2: 95% 92%    Last Pain:  Vitals:   10/24/18 1115  TempSrc:   PainSc: 0-No pain                 Karmon Andis P Domnic Vantol

## 2018-10-25 ENCOUNTER — Encounter: Payer: Self-pay | Admitting: Cardiology

## 2018-10-25 ENCOUNTER — Encounter (HOSPITAL_COMMUNITY): Payer: Self-pay | Admitting: Urology

## 2018-10-25 NOTE — Progress Notes (Signed)
Remote ICD transmission.   

## 2018-11-02 ENCOUNTER — Telehealth: Payer: Self-pay | Admitting: Medical Oncology

## 2018-11-06 NOTE — Telephone Encounter (Signed)
Mr. Lollar called, leaving a voicemail stating he is feeling really tired since having seed boost 5/27 for prostate cancer. He asked for a return call. I call him but had to leave a message. I explained that surgery and anesthesia can cause fatigue and just not feeling well. I will follow up with him next week.

## 2018-11-07 ENCOUNTER — Telehealth: Payer: Self-pay | Admitting: *Deleted

## 2018-11-07 NOTE — Telephone Encounter (Signed)
CALLED PATIENT TO REMIND OF POST SEED APPTS. FOR 11-08-18, SPOKE WITH PATIENT AND HE IS AWARE OF THESE APPTS.

## 2018-11-08 ENCOUNTER — Ambulatory Visit
Admission: RE | Admit: 2018-11-08 | Discharge: 2018-11-08 | Disposition: A | Discharge status: Home / Self Care | Payer: Medicare HMO | Source: Ambulatory Visit | Attending: Radiation Oncology | Admitting: Radiation Oncology

## 2018-11-08 ENCOUNTER — Other Ambulatory Visit: Payer: Self-pay

## 2018-11-08 ENCOUNTER — Ambulatory Visit
Admission: RE | Admit: 2018-11-08 | Discharge: 2018-11-08 | Disposition: A | Discharge status: Home / Self Care | Payer: Medicare HMO | Source: Ambulatory Visit | Attending: Urology | Admitting: Urology

## 2018-11-08 ENCOUNTER — Telehealth: Payer: Self-pay | Admitting: Radiation Oncology

## 2018-11-08 DIAGNOSIS — R8290 Unspecified abnormal findings in urine: Secondary | ICD-10-CM | POA: Insufficient documentation

## 2018-11-08 DIAGNOSIS — E785 Hyperlipidemia, unspecified: Secondary | ICD-10-CM | POA: Diagnosis not present

## 2018-11-08 DIAGNOSIS — I252 Old myocardial infarction: Secondary | ICD-10-CM | POA: Insufficient documentation

## 2018-11-08 DIAGNOSIS — R300 Dysuria: Secondary | ICD-10-CM | POA: Insufficient documentation

## 2018-11-08 DIAGNOSIS — F17210 Nicotine dependence, cigarettes, uncomplicated: Secondary | ICD-10-CM | POA: Insufficient documentation

## 2018-11-08 DIAGNOSIS — J449 Chronic obstructive pulmonary disease, unspecified: Secondary | ICD-10-CM | POA: Insufficient documentation

## 2018-11-08 DIAGNOSIS — I509 Heart failure, unspecified: Secondary | ICD-10-CM | POA: Insufficient documentation

## 2018-11-08 DIAGNOSIS — R3911 Hesitancy of micturition: Secondary | ICD-10-CM | POA: Insufficient documentation

## 2018-11-08 DIAGNOSIS — C61 Malignant neoplasm of prostate: Secondary | ICD-10-CM | POA: Diagnosis not present

## 2018-11-08 DIAGNOSIS — I2510 Atherosclerotic heart disease of native coronary artery without angina pectoris: Secondary | ICD-10-CM | POA: Diagnosis not present

## 2018-11-08 DIAGNOSIS — Z510 Encounter for antineoplastic radiation therapy: Secondary | ICD-10-CM | POA: Insufficient documentation

## 2018-11-08 NOTE — Progress Notes (Signed)
  Radiation Oncology         [phone] ________________________________  Name: [name] MRN: [id number]  Date: 11/08/2018  DOB: [birth year]  COMPLEX SIMULATION NOTE  NARRATIVE:  The patient was brought to the [facility] today following prostate seed implantation approximately one month ago.  Identity was confirmed.  All relevant records and images related to the planned course of therapy were reviewed.  Then, the patient was set-up supine.  CT images were obtained.  The CT images were loaded into the planning software.  Then the prostate and rectum were contoured.  Treatment planning then occurred.  The implanted iodine 125 seeds were identified by the physics staff for projection of radiation distribution  I have requested : 3D Simulation  I have requested a DVH of the following structures: Prostate and rectum.    ________________________________  Sheral Apley [name], M.D.

## 2018-11-08 NOTE — Telephone Encounter (Signed)
Phoned patient to inquire about urinary symptoms since seed implant. No answer. Left voicemail message with my direct number requesting a return call. Will continue to try and reach patient.

## 2018-11-08 NOTE — Progress Notes (Addendum)
  Radiation Oncology         [phone] ________________________________  Name: [name] MRN: [id number]  Date: 11/08/2018  DOB: 02-Aug-1960  SIMULATION AND TREATMENT PLANNING NOTE    ICD-10-CM   1. Malignant neoplasm of prostate ([place])  C61     DIAGNOSIS:  58 y.o. gentleman with Stage T1c adenocarcinoma of the prostate with Gleason score of 4+4, and PSA of 7.8.  NARRATIVE:  The patient was brought to the [facility].  Identity was confirmed.  All relevant records and images related to the planned course of therapy were reviewed.  The patient freely provided informed written consent to proceed with treatment after reviewing the details related to the planned course of therapy. The consent form was witnessed and verified by the simulation staff.  Then, the patient was set-up in a stable reproducible supine position for radiation therapy.  A vacuum lock pillow device was custom fabricated to position his legs in a reproducible immobilized position.  Then, I performed a urethrogram under sterile conditions to identify the prostatic apex.  CT images were obtained.  Surface markings were placed.  The CT images were loaded into the planning software.  Then the prostate target and avoidance structures including the rectum, bladder, bowel and hips were contoured.  Treatment planning then occurred.  The radiation prescription was entered and confirmed.  A total of one complex treatment devices were fabricated. I have requested : Intensity Modulated Radiotherapy (IMRT) is medically necessary for this case for the following reason:  Rectal sparing.Marland Kitchen  PLAN:  The patient will receive 45 Gy in 25 fractions of 1.8 Gy, to supplement an up-front prostate seed implant boost of 110 Gy to achieve a total nominal dose of 165 Gy.  ________________________________  Sheral Apley [name], M.D.

## 2018-11-09 ENCOUNTER — Telehealth: Payer: Self-pay | Admitting: Medical Oncology

## 2018-11-09 NOTE — Telephone Encounter (Signed)
Called [name] to see how he is feeling. Post seed boost, he was feeling down and fatigued. He states he is still tired but it has improved this week. We discussed side effects of hormone therapy, surgery and  radiation. He has days he feels like his "old"self and then others with hot flashes and fatigue. He had CT simulation yesterday and will begin radiation 6/18.He voiced concerns about daily transportation. I will make a referral to transportation and informed him to expect a call. He is aware I am working remotely and encouraged him to call me with questions or concerns and I will return his call. He voiced understanding of the above.

## 2018-11-12 ENCOUNTER — Telehealth: Payer: Self-pay | Admitting: Radiation Oncology

## 2018-11-12 ENCOUNTER — Ambulatory Visit: Payer: Medicare HMO

## 2018-11-12 DIAGNOSIS — R300 Dysuria: Secondary | ICD-10-CM

## 2018-11-12 DIAGNOSIS — R8290 Unspecified abnormal findings in urine: Secondary | ICD-10-CM

## 2018-11-12 DIAGNOSIS — Z510 Encounter for antineoplastic radiation therapy: Secondary | ICD-10-CM | POA: Diagnosis not present

## 2018-11-12 DIAGNOSIS — R3915 Urgency of urination: Secondary | ICD-10-CM

## 2018-11-12 DIAGNOSIS — R3911 Hesitancy of micturition: Secondary | ICD-10-CM

## 2018-11-12 NOTE — Telephone Encounter (Signed)
Sam- I agree with checking ua and culture. Given his heart history I think he should have already started back on his ASA. Thanks, Bryson Ha

## 2018-11-12 NOTE — Telephone Encounter (Signed)
Phoned patient back. No answer. Left voicemail message that he should resume his aspirin today. Provided my direct contact number for future questions. Reminded patient of appointment today to provide urine sample.

## 2018-11-12 NOTE — Telephone Encounter (Signed)
Second attempt to reach patient. Phoned patient's cell. Patient approximately 3 weeks out from seed implant. Patient reports urine odor is improving. Patient denies hematuria. Patient reports dysuria. Patient reports urinary urgency with little output. Patient denies other urinary or any bowel complaints. Patient understands these symptoms could be related to a urinary tract infection that would require antibiotics. Patient agreed to present today to provide a urine sample for culture and analysis. Also, patient questions when he may resume is aspirin regimen. Patient understands this RN will phone back with further directions.

## 2018-11-12 NOTE — Telephone Encounter (Signed)
Opened in error

## 2018-11-13 NOTE — Addendum Note (Signed)
Addended by: Joaquim Lai C on: 11/13/2018 12:56 PM   Modules accepted: Orders

## 2018-11-13 NOTE — Telephone Encounter (Signed)
Received voicemail from patient requesting a return call. Phoned patient back. Patient reports his urinary symptoms persist but due to transportation issues he was unable to provide a urine sample yesterday. Explained he has a ride on Thursday. Explained the lab closes at 4 thus he would need to present by 3:30 pm to provide a sample then come down for radiation therapy at 4. Patient agreed with this plan and verbalized understanding. Orders and appointment adjusted accordingly.

## 2018-11-15 ENCOUNTER — Other Ambulatory Visit: Payer: Self-pay

## 2018-11-15 ENCOUNTER — Ambulatory Visit
Admission: RE | Admit: 2018-11-15 | Discharge: 2018-11-15 | Disposition: A | Discharge status: Home / Self Care | Payer: Medicare HMO | Source: Ambulatory Visit | Attending: Urology | Admitting: Urology

## 2018-11-15 ENCOUNTER — Ambulatory Visit
Admission: RE | Admit: 2018-11-15 | Discharge: 2018-11-15 | Disposition: A | Discharge status: Home / Self Care | Payer: Medicare HMO | Source: Ambulatory Visit | Attending: Radiation Oncology | Admitting: Radiation Oncology

## 2018-11-15 ENCOUNTER — Encounter: Payer: Self-pay | Admitting: Medical Oncology

## 2018-11-15 DIAGNOSIS — R300 Dysuria: Secondary | ICD-10-CM

## 2018-11-15 DIAGNOSIS — Z510 Encounter for antineoplastic radiation therapy: Secondary | ICD-10-CM | POA: Diagnosis not present

## 2018-11-15 DIAGNOSIS — R3911 Hesitancy of micturition: Secondary | ICD-10-CM

## 2018-11-15 DIAGNOSIS — R8290 Unspecified abnormal findings in urine: Secondary | ICD-10-CM

## 2018-11-15 LAB — URINALYSIS, COMPLETE (UACMP) WITH MICROSCOPIC
Bacteria, UA: NONE SEEN
Bilirubin Urine: NEGATIVE
Glucose, UA: NEGATIVE mg/dL
Hgb urine dipstick: NEGATIVE
Ketones, ur: NEGATIVE mg/dL
Leukocytes,Ua: NEGATIVE
Nitrite: NEGATIVE
Protein, ur: NEGATIVE mg/dL
Specific Gravity, Urine: 1.008 (ref 1.005–1.030)
pH: 7 (ref 5.0–8.0)

## 2018-11-16 ENCOUNTER — Ambulatory Visit
Admission: RE | Admit: 2018-11-16 | Discharge: 2018-11-16 | Disposition: A | Discharge status: Home / Self Care | Payer: Medicare HMO | Source: Ambulatory Visit | Attending: Radiation Oncology | Admitting: Radiation Oncology

## 2018-11-16 ENCOUNTER — Telehealth: Payer: Self-pay | Admitting: Radiation Oncology

## 2018-11-16 ENCOUNTER — Other Ambulatory Visit: Payer: Self-pay

## 2018-11-16 DIAGNOSIS — Z510 Encounter for antineoplastic radiation therapy: Secondary | ICD-10-CM | POA: Diagnosis not present

## 2018-11-16 LAB — URINE CULTURE: Culture: NO GROWTH

## 2018-11-16 NOTE — Telephone Encounter (Signed)
Phoned patient to inform him of urine test results. No answer. Left message explaining his urine test came back negative for infection thus no antibiotics would need to be prescribed at this time. Provided my direct number for further questions.

## 2018-11-19 ENCOUNTER — Ambulatory Visit
Admission: RE | Admit: 2018-11-19 | Discharge: 2018-11-19 | Disposition: A | Discharge status: Home / Self Care | Payer: Medicare HMO | Source: Ambulatory Visit | Attending: Radiation Oncology | Admitting: Radiation Oncology

## 2018-11-19 DIAGNOSIS — Z510 Encounter for antineoplastic radiation therapy: Secondary | ICD-10-CM | POA: Diagnosis not present

## 2018-11-20 ENCOUNTER — Ambulatory Visit
Admission: RE | Admit: 2018-11-20 | Discharge: 2018-11-20 | Disposition: A | Discharge status: Home / Self Care | Payer: Medicare HMO | Source: Ambulatory Visit | Attending: Radiation Oncology | Admitting: Radiation Oncology

## 2018-11-20 ENCOUNTER — Other Ambulatory Visit: Payer: Self-pay

## 2018-11-20 DIAGNOSIS — Z510 Encounter for antineoplastic radiation therapy: Secondary | ICD-10-CM | POA: Diagnosis not present

## 2018-11-21 ENCOUNTER — Ambulatory Visit
Admission: RE | Admit: 2018-11-21 | Discharge: 2018-11-21 | Disposition: A | Discharge status: Home / Self Care | Payer: Medicare HMO | Source: Ambulatory Visit | Attending: Radiation Oncology | Admitting: Radiation Oncology

## 2018-11-21 ENCOUNTER — Other Ambulatory Visit: Payer: Self-pay

## 2018-11-21 DIAGNOSIS — Z510 Encounter for antineoplastic radiation therapy: Secondary | ICD-10-CM | POA: Diagnosis not present

## 2018-11-22 ENCOUNTER — Ambulatory Visit
Admission: RE | Admit: 2018-11-22 | Discharge: 2018-11-22 | Disposition: A | Discharge status: Home / Self Care | Payer: Medicare HMO | Source: Ambulatory Visit | Attending: Radiation Oncology | Admitting: Radiation Oncology

## 2018-11-22 ENCOUNTER — Other Ambulatory Visit: Payer: Self-pay

## 2018-11-22 DIAGNOSIS — Z510 Encounter for antineoplastic radiation therapy: Secondary | ICD-10-CM | POA: Diagnosis not present

## 2018-11-23 ENCOUNTER — Other Ambulatory Visit: Payer: Self-pay

## 2018-11-23 ENCOUNTER — Ambulatory Visit
Admission: RE | Admit: 2018-11-23 | Discharge: 2018-11-23 | Disposition: A | Discharge status: Home / Self Care | Payer: Medicare HMO | Source: Ambulatory Visit | Attending: Radiation Oncology | Admitting: Radiation Oncology

## 2018-11-23 DIAGNOSIS — Z510 Encounter for antineoplastic radiation therapy: Secondary | ICD-10-CM | POA: Diagnosis not present

## 2018-11-26 ENCOUNTER — Other Ambulatory Visit: Payer: Self-pay

## 2018-11-26 ENCOUNTER — Ambulatory Visit
Admission: RE | Admit: 2018-11-26 | Discharge: 2018-11-26 | Disposition: A | Discharge status: Home / Self Care | Payer: Medicare HMO | Source: Ambulatory Visit | Attending: Radiation Oncology | Admitting: Radiation Oncology

## 2018-11-26 DIAGNOSIS — Z510 Encounter for antineoplastic radiation therapy: Secondary | ICD-10-CM | POA: Diagnosis not present

## 2018-11-27 ENCOUNTER — Ambulatory Visit
Admission: RE | Admit: 2018-11-27 | Discharge: 2018-11-27 | Disposition: A | Discharge status: Home / Self Care | Payer: Medicare HMO | Source: Ambulatory Visit | Attending: Radiation Oncology | Admitting: Radiation Oncology

## 2018-11-27 DIAGNOSIS — Z510 Encounter for antineoplastic radiation therapy: Secondary | ICD-10-CM | POA: Diagnosis not present

## 2018-11-28 ENCOUNTER — Other Ambulatory Visit: Payer: Self-pay

## 2018-11-28 ENCOUNTER — Ambulatory Visit
Admission: RE | Admit: 2018-11-28 | Discharge: 2018-11-28 | Disposition: A | Discharge status: Home / Self Care | Payer: Medicare HMO | Source: Ambulatory Visit | Attending: Radiation Oncology | Admitting: Radiation Oncology

## 2018-11-28 DIAGNOSIS — Z510 Encounter for antineoplastic radiation therapy: Secondary | ICD-10-CM | POA: Diagnosis present

## 2018-11-28 DIAGNOSIS — C61 Malignant neoplasm of prostate: Secondary | ICD-10-CM | POA: Insufficient documentation

## 2018-11-28 DIAGNOSIS — J449 Chronic obstructive pulmonary disease, unspecified: Secondary | ICD-10-CM | POA: Diagnosis not present

## 2018-11-28 DIAGNOSIS — I252 Old myocardial infarction: Secondary | ICD-10-CM | POA: Insufficient documentation

## 2018-11-28 DIAGNOSIS — I509 Heart failure, unspecified: Secondary | ICD-10-CM | POA: Insufficient documentation

## 2018-11-28 DIAGNOSIS — R3911 Hesitancy of micturition: Secondary | ICD-10-CM | POA: Insufficient documentation

## 2018-11-28 DIAGNOSIS — R8290 Unspecified abnormal findings in urine: Secondary | ICD-10-CM | POA: Insufficient documentation

## 2018-11-28 DIAGNOSIS — F17210 Nicotine dependence, cigarettes, uncomplicated: Secondary | ICD-10-CM | POA: Diagnosis not present

## 2018-11-28 DIAGNOSIS — I2510 Atherosclerotic heart disease of native coronary artery without angina pectoris: Secondary | ICD-10-CM | POA: Insufficient documentation

## 2018-11-28 DIAGNOSIS — R300 Dysuria: Secondary | ICD-10-CM | POA: Diagnosis not present

## 2018-11-28 DIAGNOSIS — E785 Hyperlipidemia, unspecified: Secondary | ICD-10-CM | POA: Diagnosis not present

## 2018-11-29 ENCOUNTER — Ambulatory Visit
Admission: RE | Admit: 2018-11-29 | Discharge: 2018-11-29 | Disposition: A | Discharge status: Home / Self Care | Payer: Medicare HMO | Source: Ambulatory Visit | Attending: Radiation Oncology | Admitting: Radiation Oncology

## 2018-11-29 ENCOUNTER — Other Ambulatory Visit: Payer: Self-pay

## 2018-11-29 ENCOUNTER — Encounter: Payer: Self-pay | Admitting: Radiation Oncology

## 2018-11-29 DIAGNOSIS — Z510 Encounter for antineoplastic radiation therapy: Secondary | ICD-10-CM | POA: Diagnosis not present

## 2018-12-03 ENCOUNTER — Ambulatory Visit
Admission: RE | Admit: 2018-12-03 | Discharge: 2018-12-03 | Disposition: A | Discharge status: Home / Self Care | Payer: Medicare HMO | Source: Ambulatory Visit | Attending: Radiation Oncology | Admitting: Radiation Oncology

## 2018-12-03 ENCOUNTER — Other Ambulatory Visit: Payer: Self-pay

## 2018-12-03 DIAGNOSIS — Z510 Encounter for antineoplastic radiation therapy: Secondary | ICD-10-CM | POA: Diagnosis not present

## 2018-12-04 ENCOUNTER — Ambulatory Visit
Admission: RE | Admit: 2018-12-04 | Discharge: 2018-12-04 | Disposition: A | Discharge status: Home / Self Care | Payer: Medicare HMO | Source: Ambulatory Visit | Attending: Radiation Oncology | Admitting: Radiation Oncology

## 2018-12-04 ENCOUNTER — Other Ambulatory Visit: Payer: Self-pay

## 2018-12-04 DIAGNOSIS — Z510 Encounter for antineoplastic radiation therapy: Secondary | ICD-10-CM | POA: Diagnosis not present

## 2018-12-05 ENCOUNTER — Ambulatory Visit
Admission: RE | Admit: 2018-12-05 | Discharge: 2018-12-05 | Disposition: A | Discharge status: Home / Self Care | Payer: Medicare HMO | Source: Ambulatory Visit | Attending: Radiation Oncology | Admitting: Radiation Oncology

## 2018-12-05 ENCOUNTER — Other Ambulatory Visit: Payer: Self-pay

## 2018-12-05 DIAGNOSIS — Z510 Encounter for antineoplastic radiation therapy: Secondary | ICD-10-CM | POA: Diagnosis not present

## 2018-12-06 ENCOUNTER — Ambulatory Visit
Admission: RE | Admit: 2018-12-06 | Discharge: 2018-12-06 | Disposition: A | Discharge status: Home / Self Care | Payer: Medicare HMO | Source: Ambulatory Visit | Attending: Radiation Oncology | Admitting: Radiation Oncology

## 2018-12-06 ENCOUNTER — Other Ambulatory Visit: Payer: Self-pay

## 2018-12-06 DIAGNOSIS — Z510 Encounter for antineoplastic radiation therapy: Secondary | ICD-10-CM | POA: Diagnosis not present

## 2018-12-07 ENCOUNTER — Ambulatory Visit
Admission: RE | Admit: 2018-12-07 | Discharge: 2018-12-07 | Disposition: A | Discharge status: Home / Self Care | Payer: Medicare HMO | Source: Ambulatory Visit | Attending: Radiation Oncology | Admitting: Radiation Oncology

## 2018-12-07 DIAGNOSIS — Z510 Encounter for antineoplastic radiation therapy: Secondary | ICD-10-CM | POA: Diagnosis not present

## 2018-12-10 ENCOUNTER — Telehealth: Payer: Self-pay | Admitting: Radiation Oncology

## 2018-12-10 ENCOUNTER — Ambulatory Visit: Payer: Medicare HMO

## 2018-12-10 DIAGNOSIS — I5042 Chronic combined systolic (congestive) and diastolic (congestive) heart failure: Secondary | ICD-10-CM

## 2018-12-10 NOTE — Telephone Encounter (Signed)
Received notification from L1 that patient cancelled treatment for today. Phoned patient to inquire further. Patient reports low energy endorsing he can "barely walk from room to room." Patient reports he can't eat nothing because of nausea. Patient denies vomiting. Patient reports eating a banana, peaches and a [place] sandwich today but "unable to eat anything solid." Patient reports trying the low residue diet and drinking more water but "it doesn't help."   Patient denies dysuria or hematuria. Patient reports a moderate stream. Patient denies LUTS stating,"all that with the radiation area is fine." Patient understands this RN will inform the provider of these findings and phone him back with further directions.   Noted: 58 year old male with ICD. Hx of MI. Drinks beer and smokes daily. Hx of smoking marijuana. 7 lb weight loss noted since 11/23/2018.  Last know bp 91/59 and heart rate 87. Received 16/25 intended radiation boost treatments to his prostate. Gleason score 4+4.

## 2018-12-11 ENCOUNTER — Ambulatory Visit
Admission: RE | Admit: 2018-12-11 | Discharge: 2018-12-11 | Disposition: A | Discharge status: Home / Self Care | Payer: Medicare HMO | Source: Ambulatory Visit | Attending: Radiation Oncology | Admitting: Radiation Oncology

## 2018-12-11 ENCOUNTER — Telehealth: Payer: Self-pay | Admitting: Radiation Oncology

## 2018-12-11 ENCOUNTER — Other Ambulatory Visit: Payer: Self-pay

## 2018-12-11 ENCOUNTER — Ambulatory Visit: Payer: Medicare HMO

## 2018-12-11 ENCOUNTER — Other Ambulatory Visit: Payer: Self-pay | Admitting: Radiation Oncology

## 2018-12-11 DIAGNOSIS — N179 Acute kidney failure, unspecified: Secondary | ICD-10-CM | POA: Diagnosis not present

## 2018-12-11 DIAGNOSIS — E876 Hypokalemia: Secondary | ICD-10-CM | POA: Diagnosis not present

## 2018-12-11 MED ORDER — PROCHLORPERAZINE MALEATE 10 MG PO TABS: 10.0000 mg | ORAL_TABLET | Freq: Four times a day (QID) | ORAL | 0 refills | Status: DC | PRN

## 2018-12-11 NOTE — Telephone Encounter (Signed)
Sam- I sent in compazine to his attached pharmacy

## 2018-12-11 NOTE — Telephone Encounter (Signed)
[name] Ask patient to follow his blood pressure and contact as with readings.  Continue cardiac meds at present dose for now. Check potassium and renal function. Kirk Ruths

## 2018-12-11 NOTE — Telephone Encounter (Signed)
[name]- not sure what his bps are running if he can check them at home, if not I'd suggest they check him out in PCP to make sure his coreg doesn't need to be adjustaed. Possible that flomax is also contributing to hypotensive episodes if he's taking it regularly. Should continue XRT if possible

## 2018-12-11 NOTE — Addendum Note (Signed)
Addended by: Cristopher Estimable on: 12/11/2018 01:54 PM   Modules accepted: Orders

## 2018-12-11 NOTE — Progress Notes (Signed)
Received call from lab that patient hasn't presented for BMP ordered by Dr. Stanford Breed. Noted patient presented for treatment today. Found patient outside of [facility] sitting on a bench waiting for UBER. Patient refused to step back inside for lab work stating, "I will do it tomorrow I don't want to today." Explained to the patient his lab appointment will be rescheduled for tomorrow following radiation treatment but if his condition worsen to present to the emergency room. Patient verbalized understanding.

## 2018-12-11 NOTE — Telephone Encounter (Signed)
Spoke with pt, he will have to get something to follow his bp. Lab orders mailed to the pt

## 2018-12-11 NOTE — Telephone Encounter (Signed)
Phoned patient back. Explained that per Shona Simpson, PA-C the combination of his Coreg and Flomax could be cause his bp to remain low thus creating weakness. Instructed patient to follow up with PCP to adjust Coreg. Stress the importance of not missing radiation treatment. Patient verbalized understanding and express appreciation for the return call.

## 2018-12-11 NOTE — Telephone Encounter (Signed)
Phoned patient to ensure understanding. Patient did not answer. This RN left a detailed message instructing him to stop/hold [facility] for now. Also, instructed patient to pick up Compazine and take 1 tablet every six hours for nausea as needed. Reminded patient again of lab appointment tomorrow after treatment to draw BMP for Dr. Stanford Breed. Provided patient with my direct contact number for future needs or questions.

## 2018-12-12 ENCOUNTER — Telehealth: Payer: Self-pay | Admitting: *Deleted

## 2018-12-12 ENCOUNTER — Ambulatory Visit
Admission: RE | Admit: 2018-12-12 | Discharge: 2018-12-12 | Disposition: A | Discharge status: Home / Self Care | Payer: Medicare HMO | Source: Ambulatory Visit | Attending: Radiation Oncology | Admitting: Radiation Oncology

## 2018-12-12 ENCOUNTER — Other Ambulatory Visit: Payer: Self-pay

## 2018-12-12 ENCOUNTER — Emergency Department (HOSPITAL_COMMUNITY): Payer: Medicare HMO

## 2018-12-12 ENCOUNTER — Other Ambulatory Visit: Payer: Self-pay | Admitting: Radiation Oncology

## 2018-12-12 ENCOUNTER — Inpatient Hospital Stay (HOSPITAL_COMMUNITY)
Admission: EM | Admit: 2018-12-12 | Discharge: 2018-12-14 | DRG: 683 | Disposition: A | Discharge status: Home / Self Care | Payer: Medicare HMO | Attending: Internal Medicine | Admitting: Internal Medicine

## 2018-12-12 ENCOUNTER — Inpatient Hospital Stay (HOSPITAL_COMMUNITY): Payer: Medicare HMO

## 2018-12-12 ENCOUNTER — Encounter (HOSPITAL_COMMUNITY): Payer: Self-pay

## 2018-12-12 ENCOUNTER — Ambulatory Visit: Payer: Medicare HMO

## 2018-12-12 DIAGNOSIS — E871 Hypo-osmolality and hyponatremia: Secondary | ICD-10-CM | POA: Diagnosis present

## 2018-12-12 DIAGNOSIS — E785 Hyperlipidemia, unspecified: Secondary | ICD-10-CM | POA: Diagnosis present

## 2018-12-12 DIAGNOSIS — Z20828 Contact with and (suspected) exposure to other viral communicable diseases: Secondary | ICD-10-CM | POA: Diagnosis present

## 2018-12-12 DIAGNOSIS — Z8249 Family history of ischemic heart disease and other diseases of the circulatory system: Secondary | ICD-10-CM

## 2018-12-12 DIAGNOSIS — N179 Acute kidney failure, unspecified: Secondary | ICD-10-CM | POA: Diagnosis not present

## 2018-12-12 DIAGNOSIS — I9589 Other hypotension: Secondary | ICD-10-CM | POA: Diagnosis not present

## 2018-12-12 DIAGNOSIS — F17210 Nicotine dependence, cigarettes, uncomplicated: Secondary | ICD-10-CM | POA: Diagnosis present

## 2018-12-12 DIAGNOSIS — C3431 Malignant neoplasm of lower lobe, right bronchus or lung: Secondary | ICD-10-CM | POA: Diagnosis not present

## 2018-12-12 DIAGNOSIS — E861 Hypovolemia: Secondary | ICD-10-CM

## 2018-12-12 DIAGNOSIS — E860 Dehydration: Secondary | ICD-10-CM | POA: Diagnosis present

## 2018-12-12 DIAGNOSIS — I5042 Chronic combined systolic (congestive) and diastolic (congestive) heart failure: Secondary | ICD-10-CM | POA: Diagnosis present

## 2018-12-12 DIAGNOSIS — M21371 Foot drop, right foot: Secondary | ICD-10-CM | POA: Diagnosis present

## 2018-12-12 DIAGNOSIS — J438 Other emphysema: Secondary | ICD-10-CM | POA: Diagnosis not present

## 2018-12-12 DIAGNOSIS — R112 Nausea with vomiting, unspecified: Secondary | ICD-10-CM | POA: Diagnosis present

## 2018-12-12 DIAGNOSIS — Z95810 Presence of automatic (implantable) cardiac defibrillator: Secondary | ICD-10-CM

## 2018-12-12 DIAGNOSIS — C3490 Malignant neoplasm of unspecified part of unspecified bronchus or lung: Secondary | ICD-10-CM | POA: Diagnosis present

## 2018-12-12 DIAGNOSIS — Z951 Presence of aortocoronary bypass graft: Secondary | ICD-10-CM

## 2018-12-12 DIAGNOSIS — Z79899 Other long term (current) drug therapy: Secondary | ICD-10-CM

## 2018-12-12 DIAGNOSIS — Z7951 Long term (current) use of inhaled steroids: Secondary | ICD-10-CM

## 2018-12-12 DIAGNOSIS — Z681 Body mass index (BMI) 19 or less, adult: Secondary | ICD-10-CM | POA: Diagnosis not present

## 2018-12-12 DIAGNOSIS — I255 Ischemic cardiomyopathy: Secondary | ICD-10-CM | POA: Diagnosis present

## 2018-12-12 DIAGNOSIS — I25810 Atherosclerosis of coronary artery bypass graft(s) without angina pectoris: Secondary | ICD-10-CM

## 2018-12-12 DIAGNOSIS — R8290 Unspecified abnormal findings in urine: Secondary | ICD-10-CM

## 2018-12-12 DIAGNOSIS — I252 Old myocardial infarction: Secondary | ICD-10-CM | POA: Diagnosis not present

## 2018-12-12 DIAGNOSIS — I2510 Atherosclerotic heart disease of native coronary artery without angina pectoris: Secondary | ICD-10-CM | POA: Diagnosis present

## 2018-12-12 DIAGNOSIS — Z8701 Personal history of pneumonia (recurrent): Secondary | ICD-10-CM

## 2018-12-12 DIAGNOSIS — C61 Malignant neoplasm of prostate: Secondary | ICD-10-CM | POA: Diagnosis present

## 2018-12-12 DIAGNOSIS — E46 Unspecified protein-calorie malnutrition: Secondary | ICD-10-CM | POA: Diagnosis present

## 2018-12-12 DIAGNOSIS — I952 Hypotension due to drugs: Secondary | ICD-10-CM | POA: Diagnosis present

## 2018-12-12 DIAGNOSIS — R300 Dysuria: Secondary | ICD-10-CM

## 2018-12-12 DIAGNOSIS — R531 Weakness: Secondary | ICD-10-CM

## 2018-12-12 DIAGNOSIS — Z923 Personal history of irradiation: Secondary | ICD-10-CM

## 2018-12-12 DIAGNOSIS — E876 Hypokalemia: Secondary | ICD-10-CM | POA: Diagnosis not present

## 2018-12-12 DIAGNOSIS — I959 Hypotension, unspecified: Secondary | ICD-10-CM | POA: Diagnosis present

## 2018-12-12 DIAGNOSIS — R3915 Urgency of urination: Secondary | ICD-10-CM

## 2018-12-12 DIAGNOSIS — Z9981 Dependence on supplemental oxygen: Secondary | ICD-10-CM

## 2018-12-12 DIAGNOSIS — Z8611 Personal history of tuberculosis: Secondary | ICD-10-CM | POA: Diagnosis not present

## 2018-12-12 DIAGNOSIS — Z955 Presence of coronary angioplasty implant and graft: Secondary | ICD-10-CM

## 2018-12-12 LAB — CBC WITH DIFFERENTIAL/PLATELET
Abs Immature Granulocytes: 0.01 10*3/uL (ref 0.00–0.07)
Basophils Absolute: 0 10*3/uL (ref 0.0–0.1)
Basophils Relative: 0 %
Eosinophils Absolute: 0.4 10*3/uL (ref 0.0–0.5)
Eosinophils Relative: 6 %
HCT: 31.2 % — ABNORMAL LOW (ref 39.0–52.0)
Hemoglobin: 10.5 g/dL — ABNORMAL LOW (ref 13.0–17.0)
Immature Granulocytes: 0 %
Lymphocytes Relative: 13 %
Lymphs Abs: 0.8 10*3/uL (ref 0.7–4.0)
MCH: 28.1 pg (ref 26.0–34.0)
MCHC: 33.7 g/dL (ref 30.0–36.0)
MCV: 83.4 fL (ref 80.0–100.0)
Monocytes Absolute: 0.8 10*3/uL (ref 0.1–1.0)
Monocytes Relative: 13 %
Neutro Abs: 3.8 10*3/uL (ref 1.7–7.7)
Neutrophils Relative %: 68 %
Platelets: 282 10*3/uL (ref 150–400)
RBC: 3.74 MIL/uL — ABNORMAL LOW (ref 4.22–5.81)
RDW: 13.5 % (ref 11.5–15.5)
WBC: 5.7 10*3/uL (ref 4.0–10.5)
nRBC: 0 % (ref 0.0–0.2)

## 2018-12-12 LAB — URINALYSIS, ROUTINE W REFLEX MICROSCOPIC
Bilirubin Urine: NEGATIVE
Glucose, UA: NEGATIVE mg/dL
Hgb urine dipstick: NEGATIVE
Ketones, ur: NEGATIVE mg/dL
Leukocytes,Ua: NEGATIVE
Nitrite: NEGATIVE
Protein, ur: NEGATIVE mg/dL
Specific Gravity, Urine: 1.011 (ref 1.005–1.030)
pH: 5 (ref 5.0–8.0)

## 2018-12-12 LAB — BASIC METABOLIC PANEL - CANCER CENTER ONLY
Anion gap: 17 — ABNORMAL HIGH (ref 5–15)
BUN: 82 mg/dL — ABNORMAL HIGH (ref 6–20)
CO2: 20 mmol/L — ABNORMAL LOW (ref 22–32)
Calcium: 9.3 mg/dL (ref 8.9–10.3)
Chloride: 95 mmol/L — ABNORMAL LOW (ref 98–111)
Creatinine: 3.73 mg/dL (ref 0.61–1.24)
GFR, Est AFR Am: 20 mL/min — ABNORMAL LOW (ref >60)
GFR, Estimated: 17 mL/min — ABNORMAL LOW (ref >60)
Glucose, Bld: 93 mg/dL (ref 70–99)
Potassium: 3.2 mmol/L — ABNORMAL LOW (ref 3.5–5.1)
Sodium: 132 mmol/L — ABNORMAL LOW (ref 135–145)

## 2018-12-12 LAB — SARS CORONAVIRUS 2 BY RT PCR (HOSPITAL ORDER, PERFORMED IN ~~LOC~~ HOSPITAL LAB): SARS Coronavirus 2: NEGATIVE

## 2018-12-12 LAB — COMPREHENSIVE METABOLIC PANEL
ALT: 16 U/L (ref 0–44)
AST: 14 U/L — ABNORMAL LOW (ref 15–41)
Albumin: 3.4 g/dL — ABNORMAL LOW (ref 3.5–5.0)
Alkaline Phosphatase: 43 U/L (ref 38–126)
Anion gap: 13 (ref 5–15)
BUN: 80 mg/dL — ABNORMAL HIGH (ref 6–20)
CO2: 21 mmol/L — ABNORMAL LOW (ref 22–32)
Calcium: 9.1 mg/dL (ref 8.9–10.3)
Chloride: 99 mmol/L (ref 98–111)
Creatinine, Ser: 3.46 mg/dL — ABNORMAL HIGH (ref 0.61–1.24)
GFR calc Af Amer: 22 mL/min — ABNORMAL LOW (ref >60)
GFR calc non Af Amer: 19 mL/min — ABNORMAL LOW (ref >60)
Glucose, Bld: 96 mg/dL (ref 70–99)
Potassium: 3.3 mmol/L — ABNORMAL LOW (ref 3.5–5.1)
Sodium: 133 mmol/L — ABNORMAL LOW (ref 135–145)
Total Bilirubin: 0.5 mg/dL (ref 0.3–1.2)
Total Protein: 7.8 g/dL (ref 6.5–8.1)

## 2018-12-12 LAB — SODIUM, URINE, RANDOM: Sodium, Ur: <10 mmol/L

## 2018-12-12 LAB — LACTIC ACID, PLASMA: Lactic Acid, Venous: 1.6 mmol/L (ref 0.5–1.9)

## 2018-12-12 LAB — BRAIN NATRIURETIC PEPTIDE: B Natriuretic Peptide: 59.3 pg/mL (ref 0.0–100.0)

## 2018-12-12 LAB — CREATININE, URINE, RANDOM: Creatinine, Urine: 123.82 mg/dL

## 2018-12-12 MED ORDER — MOMETASONE FURO-FORMOTEROL FUM 100-5 MCG/ACT IN AERO
2.0000 | INHALATION_SPRAY | Freq: Two times a day (BID) | RESPIRATORY_TRACT | Status: DC
  Administered 2018-12-13 – 2018-12-14 (×3): 2 via RESPIRATORY_TRACT
  Filled 2018-12-12: qty 8.8

## 2018-12-12 MED ORDER — ONDANSETRON HCL 4 MG/2ML IJ SOLN: 4.0000 mg | Freq: Four times a day (QID) | INTRAMUSCULAR | Status: DC | PRN

## 2018-12-12 MED ORDER — SODIUM CHLORIDE 0.9% FLUSH
3.0000 mL | Freq: Two times a day (BID) | INTRAVENOUS | Status: DC
  Administered 2018-12-12 – 2018-12-14 (×3): 3 mL via INTRAVENOUS

## 2018-12-12 MED ORDER — ONDANSETRON HCL 4 MG PO TABS: 4.0000 mg | ORAL_TABLET | Freq: Four times a day (QID) | ORAL | Status: DC | PRN

## 2018-12-12 MED ORDER — TIOTROPIUM BROMIDE MONOHYDRATE 18 MCG IN CAPS: 18.0000 ug | ORAL_CAPSULE | Freq: Every day | RESPIRATORY_TRACT | Status: DC

## 2018-12-12 MED ORDER — POTASSIUM CHLORIDE CRYS ER 20 MEQ PO TBCR
20.0000 meq | EXTENDED_RELEASE_TABLET | Freq: Once | ORAL | Status: AC
  Administered 2018-12-12: 21:00:00 20 meq via ORAL
  Filled 2018-12-12: qty 1

## 2018-12-12 MED ORDER — HEPARIN SODIUM (PORCINE) 5000 UNIT/ML IJ SOLN
5000.0000 [IU] | Freq: Three times a day (TID) | INTRAMUSCULAR | Status: DC
  Administered 2018-12-12 – 2018-12-14 (×5): 5000 [IU] via SUBCUTANEOUS
  Filled 2018-12-12 (×5): qty 1

## 2018-12-12 MED ORDER — UMECLIDINIUM BROMIDE 62.5 MCG/INH IN AEPB
1.0000 | INHALATION_SPRAY | Freq: Every day | RESPIRATORY_TRACT | Status: DC
  Administered 2018-12-13 – 2018-12-14 (×2): 1 via RESPIRATORY_TRACT
  Filled 2018-12-12: qty 7

## 2018-12-12 MED ORDER — ACETAMINOPHEN 650 MG RE SUPP: 650.0000 mg | Freq: Four times a day (QID) | RECTAL | Status: DC | PRN

## 2018-12-12 MED ORDER — ACETAMINOPHEN 325 MG PO TABS: 650.0000 mg | ORAL_TABLET | Freq: Four times a day (QID) | ORAL | Status: DC | PRN

## 2018-12-12 MED ORDER — SODIUM CHLORIDE 0.9 % IV BOLUS
1000.0000 mL | Freq: Once | INTRAVENOUS | Status: AC
  Administered 2018-12-12: 1000 mL via INTRAVENOUS

## 2018-12-12 MED ORDER — ENSURE ENLIVE PO LIQD
237.0000 mL | Freq: Two times a day (BID) | ORAL | Status: DC
  Administered 2018-12-13: 10:00:00 237 mL via ORAL

## 2018-12-12 MED ORDER — SODIUM CHLORIDE 0.9 % IV SOLN
INTRAVENOUS | Status: AC
  Administered 2018-12-12 – 2018-12-13 (×2): via INTRAVENOUS

## 2018-12-12 MED ORDER — ROSUVASTATIN CALCIUM 20 MG PO TABS
10.0000 mg | ORAL_TABLET | Freq: Every day | ORAL | Status: DC
  Administered 2018-12-13: 18:00:00 10 mg via ORAL
  Filled 2018-12-12: qty 1

## 2018-12-12 NOTE — Telephone Encounter (Signed)
Received call report from [facility].  "Today's Creat = 3.73."  Providing copy of labs for provider with results.

## 2018-12-12 NOTE — Progress Notes (Signed)
PHARMACIST - PHYSICIAN COMMUNICATION  CONCERNING:  Crestor   RECOMMENDATION: Dose adjusted to 10 mg po daily for renal dysfunction  Thank you Anette Guarneri, PharmD

## 2018-12-12 NOTE — ED Notes (Signed)
Report attempted to unit, left call back number for charge nurse to call back

## 2018-12-12 NOTE — ED Triage Notes (Signed)
Per pt: His doctor sent him over "my doctor said that I have kidney failure". Pt has been getting radiation for his prostate for about 4 weeks now. No chemo therapy. Pt has not had any fevers. Pts blood pressure in triage is 79/56. Pt states that he hasn't felt well for about 2 weeks. Pt is alert but appears very tired.

## 2018-12-12 NOTE — Telephone Encounter (Signed)
Called patient per lab to inform that they close on Wed. and Friday @ 3 pm, spoke with patient and he said that he would try to be @ 3 pm today, notified lab
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
## 2018-12-12 NOTE — ED Notes (Signed)
Dr. Schlossman at bedside. 

## 2018-12-12 NOTE — Telephone Encounter (Signed)
Spoke with pt, aware of lab results and Patient voiced understanding to go to the ER now for evaluation.

## 2018-12-12 NOTE — Telephone Encounter (Signed)
Results not provided to Medical oncologist. Connected with radiation oncology with results.  Writer notified radiation will provide outside provider of results drawn as requested of [facility].
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
## 2018-12-12 NOTE — ED Notes (Signed)
Attempted report again, RN not available. Will provide bedside report
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
## 2018-12-13 ENCOUNTER — Ambulatory Visit: Payer: Medicare HMO

## 2018-12-13 ENCOUNTER — Telehealth: Payer: Self-pay | Admitting: Family Medicine

## 2018-12-13 LAB — BASIC METABOLIC PANEL
Anion gap: 9 (ref 5–15)
BUN: 65 mg/dL — ABNORMAL HIGH (ref 6–20)
CO2: 22 mmol/L (ref 22–32)
Calcium: 8.6 mg/dL — ABNORMAL LOW (ref 8.9–10.3)
Chloride: 104 mmol/L (ref 98–111)
Creatinine, Ser: 1.83 mg/dL — ABNORMAL HIGH (ref 0.61–1.24)
GFR calc Af Amer: 47 mL/min — ABNORMAL LOW (ref >60)
GFR calc non Af Amer: 40 mL/min — ABNORMAL LOW (ref >60)
Glucose, Bld: 96 mg/dL (ref 70–99)
Potassium: 3.4 mmol/L — ABNORMAL LOW (ref 3.5–5.1)
Sodium: 135 mmol/L (ref 135–145)

## 2018-12-13 LAB — URINE CULTURE: Culture: NO GROWTH

## 2018-12-13 LAB — CBC
HCT: 25.3 % — ABNORMAL LOW (ref 39.0–52.0)
Hemoglobin: 8.7 g/dL — ABNORMAL LOW (ref 13.0–17.0)
MCH: 28.2 pg (ref 26.0–34.0)
MCHC: 34.4 g/dL (ref 30.0–36.0)
MCV: 81.9 fL (ref 80.0–100.0)
Platelets: 268 10*3/uL (ref 150–400)
RBC: 3.09 MIL/uL — ABNORMAL LOW (ref 4.22–5.81)
RDW: 13.2 % (ref 11.5–15.5)
WBC: 4.4 10*3/uL (ref 4.0–10.5)
nRBC: 0 % (ref 0.0–0.2)

## 2018-12-13 LAB — HIV ANTIBODY (ROUTINE TESTING W REFLEX): HIV Screen 4th Generation wRfx: NONREACTIVE

## 2018-12-13 MED ORDER — SODIUM CHLORIDE 0.9 % IV BOLUS
500.0000 mL | Freq: Once | INTRAVENOUS | Status: AC
  Administered 2018-12-13: 22:00:00 500 mL via INTRAVENOUS

## 2018-12-13 MED ORDER — SODIUM CHLORIDE 0.9 % IV SOLN
INTRAVENOUS | Status: DC
  Administered 2018-12-13 – 2018-12-14 (×2): via INTRAVENOUS

## 2018-12-13 MED ORDER — DIPHENHYDRAMINE HCL 25 MG PO CAPS
25.0000 mg | ORAL_CAPSULE | Freq: Once | ORAL | Status: AC
  Administered 2018-12-13: 25 mg via ORAL
  Filled 2018-12-13: qty 1

## 2018-12-13 MED ORDER — SODIUM CHLORIDE 0.9 % IV BOLUS
500.0000 mL | Freq: Once | INTRAVENOUS | Status: AC
  Administered 2018-12-13: 04:00:00 500 mL via INTRAVENOUS

## 2018-12-13 NOTE — Progress Notes (Signed)
8638 Phoned Kerrie Buffalo, RN providing care for patient while admitted. Then, phoned Ashlyn Bruning, PA-C with an update. Phoned Merrilee Seashore, RT on L1 explained that per Ashlyn radiation treatment should be cancelled for today.  1533 Phoned [name], RN back for an update from Dr. Cathlean Sauer following rounds. She verbalizes her understand that the patient could be discharged as early as tomorrow. Requested she inform the patient if he is discharged between 8-4 to head to [facility] to be worked in for radiation therapy. [name], RN confirmed she would do this.

## 2018-12-13 NOTE — Progress Notes (Signed)
Asked patient about his urine output today as no output has been charted since this morning. Patient reports that he has been using the toilet. Educated patient about the importance of accurate I&O measurements due to heart failure. Patient states he will use the urinal from now on so that output can be recorded.

## 2018-12-13 NOTE — Progress Notes (Signed)
PROGRESS NOTE    Ryan Ogborn  QQP:[id number] DOB: 1960/10/24 DOA: 12/12/2018 PCP: Lucianne Lei, MD    Brief Narrative:  58 year old male who presented with weakness and hypotension.  He does have significant past medical history of lung cancer status post right lower lobe wedge resection, prostate cancer undergoing radiation therapy, systolic heart failure, COPD and ongoing tobacco abuse.  Reported 2 weeks of generalized weakness, malaise that has complicated with low blood pressure.  He was worked up as an outpatient and found to have worsening kidney function, referred for admission for further evaluation.  On his initial physical examination his blood pressure was 92/58, heart rate 83, respiratory 22, oxygen saturation 95%.  His lungs were clear to auscultation bilaterally, heart S1-S2 present and regular, abdomen was soft, no lower extremity edema.  Sodium 133, potassium 3.3, chloride 99, bicarb 21, glucose 96, BUN 80, creatinine 3.46, Krenzer count 5.7, hemoglobin 10.5 hematocrit 31.2, platelets 282.  SARS COVID-19 was negative.  Urinalysis negative for infection, specific gravity 1.011, urinary sodium less than 10.  His chest radiograph had a left apical chronic cavitary lesion, loss of lung volume on the left.  EKG 80 bpm, left axis deviation, normal intervals, sinus rhythm, J-point elevation V1 through V4, T wave versions lead I aVL  Patient was admitted to the hospital working diagnosis of acute kidney injury due to hypovolemia.   Assessment & Plan:   Principal Problem:   AKI (acute kidney injury) ([place]) Active Problems:   Coronary artery disease   Other emphysema ([place])   Hypokalemia   Lung cancer ([facility])   Hypotension (arterial)   Malignant neoplasm of prostate ([place])   1. Hypovolemic AKI with hypokalemia and hyponatremia. Patient continue to be volume depleted, his blood pressure this am down to 92/55. Documented urine output 550 ml. His renal function not at baseline yet with serum  cr at 1,83, K at 3,4 and serum bicarbonate at 22. Will continue hydration with isotonic saline at 75 ml per H and follow on renal panel in am, avoid further hypotension or nephrotoxic agents.  2. Chronic systolic heart failure. EF 20 to 25% sp AICD, will continue to hold on b blockade and entresto for now, will continue hydration with isotonic saline. Continue telemetry monitoring.  3. Prostate cancer. Currently under radiation therapy.   4. COPD with lung cancer. Patient with no dyspnea, sp resection of the right lower lobe, he has left upper lobe cavitary lesion with possible chronic fungal infection. May need diagnostic bronchoscopy. Will check with Dr. Irene Limbo if no workup has been done as outpatient. Continue bronchodilator therapy.    DVT prophylaxis: enoxaparin   Code Status: full Family Communication: no family at the bedside  Disposition Plan/ discharge barriers: pending clinical improvement.   Body mass index is 16.66 kg/m. Malnutrition Type:      Malnutrition Characteristics:      Nutrition Interventions:     RN Pressure Injury Documentation:     Consultants:     Procedures:     Antimicrobials:       Subjective: Patient is feeling better, but not yet back to baseline, continue to have significant weakness and his blood pressure continue to be low.   Objective: Vitals:   12/13/18 0926 12/13/18 0927 12/13/18 0950 12/13/18 1200  BP:   (!) 92/55 119/69  Pulse:   68   Resp:   20   Temp:   98.3 F (36.8 C)   TempSrc:   Oral   SpO2: 97% 97%  97%   Weight:      Height:        Intake/Output Summary (Last 24 hours) at 12/13/2018 1430 Last data filed at 12/13/2018 1300 Gross per 24 hour  Intake 3271.46 ml  Output 850 ml  Net 2421.46 ml   Filed Weights   12/12/18 1925 12/12/18 2047 12/13/18 0607  Weight: 50.8 kg 49.7 kg 49.7 kg    Examination:   General: deconditioned and ill looking appearing  Neurology: Awake and alert, non focal  E ENT: mild  pallor, no icterus, oral mucosa moist Cardiovascular: No JVD. S1-S2 present, rhythmic, no gallops, rubs, or murmurs. No lower extremity edema. Pulmonary: positive breath sounds bilaterally, adequate air movement, no wheezing, rhonchi or rales. Gastrointestinal. Abdomen with no organomegaly, non tender, no rebound or guarding Skin. No rashes Musculoskeletal: no joint deformities     Data Reviewed: I have personally reviewed following labs and imaging studies  CBC: Recent Labs  Lab 12/12/18 1815 12/13/18 0536  WBC 5.7 4.4  NEUTROABS 3.8  --   HGB 10.5* 8.7*  HCT 31.2* 25.3*  MCV 83.4 81.9  PLT 282 409   Basic Metabolic Panel: Recent Labs  Lab 12/12/18 1512 12/12/18 1815 12/13/18 0536  NA 132* 133* 135  K 3.2* 3.3* 3.4*  CL 95* 99 104  CO2 20* 21* 22  GLUCOSE 93 96 96  BUN 82* 80* 65*  CREATININE 3.73* 3.46* 1.83*  CALCIUM 9.3 9.1 8.6*   GFR: Estimated Creatinine Clearance: 31.7 mL/min (A) (by C-G formula based on SCr of 1.83 mg/dL (H)). Liver Function Tests: Recent Labs  Lab 12/12/18 1815  AST 14*  ALT 16  ALKPHOS 43  BILITOT 0.5  PROT 7.8  ALBUMIN 3.4*   No results for input(s): LIPASE, AMYLASE in the last 168 hours. No results for input(s): AMMONIA in the last 168 hours. Coagulation Profile: No results for input(s): INR, PROTIME in the last 168 hours. Cardiac Enzymes: No results for input(s): CKTOTAL, CKMB, CKMBINDEX, TROPONINI in the last 168 hours. BNP (last 3 results) No results for input(s): PROBNP in the last 8760 hours. HbA1C: No results for input(s): HGBA1C in the last 72 hours. CBG: No results for input(s): GLUCAP in the last 168 hours. Lipid Profile: No results for input(s): CHOL, HDL, LDLCALC, TRIG, CHOLHDL, LDLDIRECT in the last 72 hours. Thyroid Function Tests: No results for input(s): TSH, T4TOTAL, FREET4, T3FREE, THYROIDAB in the last 72 hours. Anemia Panel: No results for input(s): VITAMINB12, FOLATE, FERRITIN, TIBC, IRON, RETICCTPCT  in the last 72 hours.    Radiology Studies: I have reviewed all of the imaging during this hospital visit personally     Scheduled Meds: . feeding supplement (ENSURE ENLIVE)  237 mL Oral BID BM  . heparin  5,000 Units Subcutaneous Q8H  . mometasone-formoterol  2 puff Inhalation BID  . rosuvastatin  10 mg Oral q1800  . sodium chloride flush  3 mL Intravenous Q12H  . umeclidinium bromide  1 puff Inhalation Daily   Continuous Infusions:   LOS: 1 day        Mauricio Gerome Apley, MD

## 2018-12-13 NOTE — Telephone Encounter (Signed)
Patient called and advised that he in the hospital and does not know when he will be needing our services again

## 2018-12-13 NOTE — Progress Notes (Signed)
Per patient has had 7-8 loose, watery bowel movements today and has had these type of bowel movements for about two weeks.  Patient has been receiving radiation treatments.  [facility] paged with this information.

## 2018-12-13 NOTE — Progress Notes (Signed)
Patient requesting something to help him sleep, no PRN's ordered.  [facility] text paged with this information.

## 2018-12-13 NOTE — Progress Notes (Signed)
Patient's blood pressure this evening 88/58, IV fluids infusing, patient asymptomatic.  RN text paged [facility] with this information.

## 2018-12-14 ENCOUNTER — Ambulatory Visit: Payer: Medicare HMO

## 2018-12-14 ENCOUNTER — Telehealth: Payer: Self-pay | Admitting: Radiation Oncology

## 2018-12-14 LAB — BASIC METABOLIC PANEL
Anion gap: 5 (ref 5–15)
BUN: 27 mg/dL — ABNORMAL HIGH (ref 6–20)
CO2: 23 mmol/L (ref 22–32)
Calcium: 9.1 mg/dL (ref 8.9–10.3)
Chloride: 109 mmol/L (ref 98–111)
Creatinine, Ser: 1 mg/dL (ref 0.61–1.24)
GFR calc Af Amer: >60 mL/min (ref >60)
GFR calc non Af Amer: >60 mL/min (ref >60)
Glucose, Bld: 89 mg/dL (ref 70–99)
Potassium: 3.6 mmol/L (ref 3.5–5.1)
Sodium: 137 mmol/L (ref 135–145)

## 2018-12-14 LAB — MAGNESIUM: Magnesium: 1.4 mg/dL — ABNORMAL LOW (ref 1.7–2.4)

## 2018-12-14 LAB — UREA NITROGEN, URINE: Urea Nitrogen, Ur: 669 mg/dL

## 2018-12-14 MED ORDER — ENSURE ENLIVE PO LIQD: 237.0000 mL | Freq: Two times a day (BID) | ORAL | 0 refills | Status: AC

## 2018-12-14 MED ORDER — POTASSIUM CHLORIDE CRYS ER 20 MEQ PO TBCR
40.0000 meq | EXTENDED_RELEASE_TABLET | Freq: Once | ORAL | Status: AC
  Administered 2018-12-14: 12:00:00 40 meq via ORAL
  Filled 2018-12-14: qty 2

## 2018-12-14 NOTE — Care Management Important Message (Signed)
Important Message  Patient Details  Name: Bradyn Soward MRN: [id number] Date of Birth: 1960-06-25   Medicare Important Message Given:  Yes     Shelda Altes 12/14/2018, 12:53 PM

## 2018-12-14 NOTE — Discharge Summary (Signed)
Physician Discharge Summary  [name] NWG:[id number] DOB: Jul 18, 1960 DOA: 12/12/2018  PCP: Lucianne Lei, MD  Admit date: 12/12/2018 Discharge date: 12/14/2018  Admitted From: Home  Disposition:  Home   Recommendations for Outpatient Follow-up and new medication changes:  1. Follow up with Dr. Criss Rosales in 7 days.  2. Entresto on hold for now due to hypotension.  3. Patient started on nutritional supplements.  4. Patient needs follow up with pulmonary and oncology in regards of left upper lobe cavitary lesion with possible fungal infection and right lower lobe nodule.   Home Health: no   Equipment/Devices: no    Discharge Condition: stable  CODE STATUS: full  Diet recommendation: heart healthy   Brief/Interim Summary: 58 year old male who presented with weakness and hypotension.  He does have significant past medical history of lung cancer status post right lower lobe wedge resection, prostate cancer undergoing radiation therapy, systolic heart failure, COPD and ongoing tobacco abuse.  Reported 2 weeks of generalized weakness, malaise that has complicated with low blood pressure.  He was worked up as an outpatient and found to have worsening kidney function, referred for admission for further evaluation.  On his initial physical examination his blood pressure was 92/58, heart rate 83, respiratory 22, oxygen saturation 95%.  His lungs were clear to auscultation bilaterally, heart S1-S2 present and regular, abdomen was soft, no lower extremity edema.  Sodium 133, potassium 3.3, chloride 99, bicarb 21, glucose 96, BUN 80, creatinine 3.46, Simonich count 5.7, hemoglobin 10.5 hematocrit 31.2, platelets 282.  SARS COVID-19 was negative.  Urinalysis negative for infection, specific gravity 1.011, urinary sodium less than 10.  His chest radiograph had a left apical chronic cavitary lesion, loss of lung volume on the left.  EKG 80 bpm, left axis deviation, normal intervals, sinus rhythm, J-point elevation V1  through V4, T wave versions lead I aVL  Patient was admitted to the hospital working diagnosis of acute kidney injury due to hypovolemia.  1.  Hypovolemic acute kidney injury complicated with hypokalemia and hyponatremia.  Patient was admitted to the progressive care unit, he received isotonic fluids with good toleration, his discharge creatinine is 1.0, potassium 3.6, serum bicarbonate 27, sodium 137.  For now will recommend to hold [facility], follow-up as an outpatient.   2.  Chronic systolic heart failure.  Ejection fraction 2025% status post AICD.  Patient will resume beta blockade with carvedilol, hold on Entresto for now due to hypotension, follow-up as an outpatient.  3.  Prostate cancer.  Currently undergoing radiation therapy.  4.  COPD with a lung cancer.  His last chest CT is dated September 2019, it showed a stable 4.3 x 3.0 cm cavitary lesion in left lung apex.  Unchanged in size from prior scans.  A progressive soft tissue nodule in the medial right lower lobe along the suture line measuring 16 x 13 mm.  Patient will follow-up as an outpatient with pulmonary and oncology. Continue advair and tiotropium.   5. Calorie protein malnutrition. Unspecified severity. Patient has been placed on nutritional supplements.   Discharge Diagnoses:  Principal Problem:   AKI (acute kidney injury) ([place]) Active Problems:   Coronary artery disease   Other emphysema ([place])   Hypokalemia   Lung cancer ([facility])   Hypotension (arterial)   Malignant neoplasm of prostate [facility])    Discharge Instructions   Allergies as of 12/14/2018   No Known Allergies     Medication List    STOP taking these medications   sacubitril-valsartan 24-26  MG Commonly known as: ENTRESTO   tamsulosin 0.4 MG Caps capsule Commonly known as: FLOMAX   traMADol 50 MG tablet Commonly known as: Ultram     TAKE these medications   carvedilol 6.25 MG tablet Commonly known as: COREG Take 6.25 mg by mouth 2 (two) times  a day.   feeding supplement (ENSURE ENLIVE) Liqd Take 237 mLs by mouth 2 (two) times daily between meals.   Fluticasone-Salmeterol 100-50 MCG/DOSE Aepb Commonly known as: Advair Diskus INHALE CONTENTS OF 1 BLISTER USING DISKUS TWO TIMES DAILY What changed:   how much to take  how to take this  when to take this  additional instructions   prochlorperazine 10 MG tablet Commonly known as: COMPAZINE Take 1 tablet (10 mg total) by mouth every 6 (six) hours as needed for nausea or vomiting.   rosuvastatin 40 MG tablet Commonly known as: CRESTOR Take 40 mg by mouth daily.   tiotropium 18 MCG inhalation capsule Commonly known as: Spiriva HandiHaler PLACE 1 CAPSULE INTO HANDIHALER AND INHALE DAILY What changed:   how much to take  how to take this  when to take this  additional instructions       No Known Allergies  Consultations:     Procedures/Studies: US Renal  Result Date: 12/12/2018 CLINICAL DATA:  Acute kidney injury EXAM: RENAL / URINARY TRACT ULTRASOUND COMPLETE COMPARISON:  None. FINDINGS: Right Kidney: Renal measurements: 10.5 x 4.9 x 6.8 cm = volume: 183 mL . Echogenicity within normal limits. No mass or hydronephrosis visualized. Left Kidney: Renal measurements: 11.1 x 5.9 x 6.2 cm = volume: 210 mL. Echogenicity within normal limits. No mass or hydronephrosis visualized. Bladder: Normal study. Electronically Signed   By: Rolm Baptise M.D.   On: 12/12/2018 20:45   Dg Chest Portable 1 View  Result Date: 12/12/2018 CLINICAL DATA:  Weakness, renal failure, current receiving prostate radiation EXAM: PORTABLE CHEST 1 VIEW COMPARISON:  Chest radiograph Oct 16, 2018, chest CT February 07, 2018 FINDINGS: Redemonstration of the left upper lobe cavitary lung lesions which are similar in appearance to prior CT and chest radiography. Emphysematous changes and right upper lobe pleuroparenchymal scarring are similar to prior exams as well. Blunting of the costophrenic  angles likely related to scarring. No new consolidative opacities. Portion of the cardiac silhouette is obscured by overlying opacity but appears grossly similar to prior exams with multiple coronary stents in place and post sternotomy changes. The aorta is calcified and tortuous. A pacer/defibrillator battery pack overlies the left chest wall with lead of the cardiac apex. Cardiac monitoring leads overlie the chest. No acute osseous or soft tissue abnormality. IMPRESSION: Stable cavitary lesions in the left upper lung, concerning for atypical infectious etiologies including mycetoma. Bilateral areas of pleuroparenchymal scarring similar to prior studies. No new consolidative opacity. Aortic atherosclerosis. Electronically Signed   By: Lovena Le M.D.   On: 12/12/2018 18:33      Procedures:   Subjective: Patient is feeling better, no chest pain or dyspnea, tolerating po well.   Discharge Exam: Vitals:   12/14/18 0832 12/14/18 0845  BP:  (!) 95/54  Pulse:    Resp:    Temp:  98.3 F (36.8 C)  SpO2: 98% 96%   Vitals:   12/14/18 0613 12/14/18 0618 12/14/18 0832 12/14/18 0845  BP: 106/61   (!) 95/54  Pulse: 74     Resp: 20     Temp: 98.1 F (36.7 C)   98.3 F (36.8 C)  TempSrc: Oral  Oral  SpO2: 98%  98% 96%  Weight:  51.7 kg    Height:        General: Not in pain or dyspnea.  Neurology: Awake and alert, non focal  E ENT: no pallor, no icterus, oral mucosa moist Cardiovascular: No JVD. S1-S2 present, on S3 or S4 gallop, rhythmic, no gallops, rubs, or murmurs. No lower extremity edema. Pulmonary: positive breath sounds bilaterally, adequate air movement, no wheezing, rhonchi or rales. Gastrointestinal. Abdomen with, no organomegaly, non tender, no rebound or guarding Skin. No rashes Musculoskeletal: no joint deformities   The results of significant diagnostics from this hospitalization (including imaging, microbiology, ancillary and laboratory) are listed below for reference.      Microbiology: Recent Results (from the past 240 hour(s))  Culture, blood (Routine X 2) w Reflex to ID Panel     Status: None (Preliminary result)   Collection Time: 12/12/18  6:10 PM   Specimen: BLOOD  Result Value Ref Range Status   Specimen Description BLOOD RIGHT ANTECUBITAL  Final   Special Requests   Final    BOTTLES DRAWN AEROBIC AND ANAEROBIC Blood Culture results may not be optimal due to an inadequate volume of blood received in culture bottles   Culture   Final    NO GROWTH < 24 HOURS Performed at [facility], [place] [address], [place], [place] [address]    Report Status PENDING  Incomplete  Culture, blood (Routine X 2) w Reflex to ID Panel     Status: None (Preliminary result)   Collection Time: 12/12/18  6:14 PM   Specimen: BLOOD  Result Value Ref Range Status   Specimen Description BLOOD BLOOD RIGHT FOREARM  Final   Special Requests   Final    BOTTLES DRAWN AEROBIC ONLY Blood Culture results may not be optimal due to an inadequate volume of blood received in culture bottles   Culture   Final    NO GROWTH < 24 HOURS Performed at [facility], [place] [address] [place],  [address]    Report Status PENDING  Incomplete  SARS Coronavirus 2 (CEPHEID - Performed in [place] hospital lab), Hosp Order     Status: None   Collection Time: 12/12/18  6:46 PM   Specimen: Nasopharyngeal Swab  Result Value Ref Range Status   SARS Coronavirus 2 NEGATIVE NEGATIVE Final    Comment: (NOTE) If result is NEGATIVE SARS-CoV-2 target nucleic acids are NOT DETECTED. The SARS-CoV-2 RNA is generally detectable in upper and lower  respiratory specimens during the acute phase of infection. The lowest  concentration of SARS-CoV-2 viral copies this assay can detect is 250  copies / mL. A negative result does not preclude SARS-CoV-2 infection  and should not be used as the sole basis for treatment or other  patient management decisions.  A negative result may occur with   improper specimen collection / handling, submission of specimen other  than nasopharyngeal swab, presence of viral mutation(s) within the  areas targeted by this assay, and inadequate number of viral copies  (<250 copies / mL). A negative result must be combined with clinical  observations, patient history, and epidemiological information. If result is POSITIVE SARS-CoV-2 target nucleic acids are DETECTED. The SARS-CoV-2 RNA is generally detectable in upper and lower  respiratory specimens dur ing the acute phase of infection.  Positive  results are indicative of active infection with SARS-CoV-2.  Clinical  correlation with patient history and other diagnostic information is  necessary  to determine patient infection status.  Positive results do  not rule out bacterial infection or co-infection with other viruses. If result is PRESUMPTIVE POSTIVE SARS-CoV-2 nucleic acids MAY BE PRESENT.   A presumptive positive result was obtained on the submitted specimen  and confirmed on repeat testing.  While 2019 novel coronavirus  (SARS-CoV-2) nucleic acids may be present in the submitted sample  additional confirmatory testing may be necessary for epidemiological  and / or clinical management purposes  to differentiate between  SARS-CoV-2 and other Sarbecovirus currently known to infect humans.  If clinically indicated additional testing with an alternate test  methodology [phone]) is advised. The SARS-CoV-2 RNA is generally  detectable in upper and lower respiratory sp ecimens during the acute  phase of infection. The expected result is Negative. Fact Sheet for Patients:  StrictlyIdeas.no Fact Sheet for Healthcare Providers: BankingDealers.co.za This test is not yet approved or cleared by the [place] FDA and has been authorized for detection and/or diagnosis of SARS-CoV-2 by FDA under an Emergency Use Authorization (EUA).  This EUA will  remain in effect (meaning this test can be used) for the duration of the COVID-19 declaration under Section 564(b)(1) of the Act, 21 U.S.C. section 360bbb-3(b)(1), unless the authorization is terminated or revoked sooner. Performed at [facility], [place] [address], [place], [place] [address]   Urine culture     Status: None   Collection Time: 12/12/18  7:27 PM   Specimen: Urine, Random  Result Value Ref Range Status   Specimen Description URINE, RANDOM  Final   Special Requests NONE  Final   Culture   Final    NO GROWTH Performed at [facility], [place] [address] [place], [place] [address]    Report Status 12/13/2018 FINAL  Final     Labs: BNP (last 3 results) Recent Labs    12/12/18 1810  BNP 19.6   Basic Metabolic Panel: Recent Labs  Lab 12/12/18 1512 12/12/18 1815 12/13/18 0536 12/14/18 0551  NA 132* 133* 135 137  K 3.2* 3.3* 3.4* 3.6  CL 95* 99 104 109  CO2 20* 21* 22 23  GLUCOSE 93 96 96 89  BUN 82* 80* 65* 27*  CREATININE 3.73* 3.46* 1.83* 1.00  CALCIUM 9.3 9.1 8.6* 9.1  MG  --   --   --  1.4*   Liver Function Tests: Recent Labs  Lab 12/12/18 1815  AST 14*  ALT 16  ALKPHOS 43  BILITOT 0.5  PROT 7.8  ALBUMIN 3.4*   No results for input(s): LIPASE, AMYLASE in the last 168 hours. No results for input(s): AMMONIA in the last 168 hours. CBC: Recent Labs  Lab 12/12/18 1815 12/13/18 0536  WBC 5.7 4.4  NEUTROABS 3.8  --   HGB 10.5* 8.7*  HCT 31.2* 25.3*  MCV 83.4 81.9  PLT 282 268   Cardiac Enzymes: No results for input(s): CKTOTAL, CKMB, CKMBINDEX, TROPONINI in the last 168 hours. BNP: Invalid input(s): POCBNP CBG: No results for input(s): GLUCAP in the last 168 hours. D-Dimer No results for input(s): DDIMER in the last 72 hours. Hgb A1c No results for input(s): HGBA1C in the last 72 hours. Lipid Profile No results for input(s): CHOL, HDL, LDLCALC, TRIG, CHOLHDL, LDLDIRECT in the last 72 hours. Thyroid function studies No  results for input(s): TSH, T4TOTAL, T3FREE, THYROIDAB in the last 72 hours.  Invalid input(s): FREET3 Anemia work up No results for input(s): VITAMINB12, FOLATE, FERRITIN, TIBC, IRON, RETICCTPCT in the last  72 hours. Urinalysis    Component Value Date/Time   COLORURINE STRAW (A) 12/12/2018 1927   APPEARANCEUR CLEAR 12/12/2018 1927   LABSPEC 1.011 12/12/2018 1927   PHURINE 5.0 12/12/2018 1927   GLUCOSEU NEGATIVE 12/12/2018 1927   HGBUR NEGATIVE 12/12/2018 1927   BILIRUBINUR NEGATIVE 12/12/2018 [place] NEGATIVE 12/12/2018 1927   PROTEINUR NEGATIVE 12/12/2018 1927   UROBILINOGEN 0.2 08/06/2013 1436   NITRITE NEGATIVE 12/12/2018 1927   LEUKOCYTESUR NEGATIVE 12/12/2018 1927   Sepsis Labs Invalid input(s): PROCALCITONIN,  WBC,  LACTICIDVEN Microbiology Recent Results (from the past 240 hour(s))  Culture, blood (Routine X 2) w Reflex to ID Panel     Status: None (Preliminary result)   Collection Time: 12/12/18  6:10 PM   Specimen: BLOOD  Result Value Ref Range Status   Specimen Description BLOOD RIGHT ANTECUBITAL  Final   Special Requests   Final    BOTTLES DRAWN AEROBIC AND ANAEROBIC Blood Culture results may not be optimal due to an inadequate volume of blood received in culture bottles   Culture   Final    NO GROWTH < 24 HOURS Performed at [facility], [place] [address], [place], [place] [address]    Report Status PENDING  Incomplete  Culture, blood (Routine X 2) w Reflex to ID Panel     Status: None (Preliminary result)   Collection Time: 12/12/18  6:14 PM   Specimen: BLOOD  Result Value Ref Range Status   Specimen Description BLOOD BLOOD RIGHT FOREARM  Final   Special Requests   Final    BOTTLES DRAWN AEROBIC ONLY Blood Culture results may not be optimal due to an inadequate volume of blood received in culture bottles   Culture   Final    NO GROWTH < 24 HOURS Performed at [facility], [place] [address] [place],  [address]    Report Status PENDING   Incomplete  SARS Coronavirus 2 (CEPHEID - Performed in [place] hospital lab), Hosp Order     Status: None   Collection Time: 12/12/18  6:46 PM   Specimen: Nasopharyngeal Swab  Result Value Ref Range Status   SARS Coronavirus 2 NEGATIVE NEGATIVE Final    Comment: (NOTE) If result is NEGATIVE SARS-CoV-2 target nucleic acids are NOT DETECTED. The SARS-CoV-2 RNA is generally detectable in upper and lower  respiratory specimens during the acute phase of infection. The lowest  concentration of SARS-CoV-2 viral copies this assay can detect is 250  copies / mL. A negative result does not preclude SARS-CoV-2 infection  and should not be used as the sole basis for treatment or other  patient management decisions.  A negative result may occur with  improper specimen collection / handling, submission of specimen other  than nasopharyngeal swab, presence of viral mutation(s) within the  areas targeted by this assay, and inadequate number of viral copies  (<250 copies / mL). A negative result must be combined with clinical  observations, patient history, and epidemiological information. If result is POSITIVE SARS-CoV-2 target nucleic acids are DETECTED. The SARS-CoV-2 RNA is generally detectable in upper and lower  respiratory specimens dur ing the acute phase of infection.  Positive  results are indicative of active infection with SARS-CoV-2.  Clinical  correlation with patient history and other diagnostic information is  necessary to determine patient infection status.  Positive results do  not rule out bacterial infection or co-infection with other viruses. If result is PRESUMPTIVE POSTIVE SARS-CoV-2 nucleic acids MAY BE  PRESENT.   A presumptive positive result was obtained on the submitted specimen  and confirmed on repeat testing.  While 2019 novel coronavirus  (SARS-CoV-2) nucleic acids may be present in the submitted sample  additional confirmatory testing may be necessary for  epidemiological  and / or clinical management purposes  to differentiate between  SARS-CoV-2 and other Sarbecovirus currently known to infect humans.  If clinically indicated additional testing with an alternate test  methodology [phone]) is advised. The SARS-CoV-2 RNA is generally  detectable in upper and lower respiratory sp ecimens during the acute  phase of infection. The expected result is Negative. Fact Sheet for Patients:  StrictlyIdeas.no Fact Sheet for Healthcare Providers: BankingDealers.co.za This test is not yet approved or cleared by the [place] FDA and has been authorized for detection and/or diagnosis of SARS-CoV-2 by FDA under an Emergency Use Authorization (EUA).  This EUA will remain in effect (meaning this test can be used) for the duration of the COVID-19 declaration under Section 564(b)(1) of the Act, 21 U.S.C. section 360bbb-3(b)(1), unless the authorization is terminated or revoked sooner. Performed at [facility], [place] [address] [place], [place] [address]   Urine culture     Status: None   Collection Time: 12/12/18  7:27 PM   Specimen: Urine, Random  Result Value Ref Range Status   Specimen Description URINE, RANDOM  Final   Special Requests NONE  Final   Culture   Final    NO GROWTH Performed at [facility], [place] [address], [place], [place] [address]    Report Status 12/13/2018 FINAL  Final     Time coordinating discharge: 45 minutes  SIGNED:   Tawni Millers, MD  [facility] 12/14/2018, 10:42 AM

## 2018-12-14 NOTE — Telephone Encounter (Signed)
Noted patient is being discharged today. Phoned patient to inquire about his intentions for radiation therapy. Encouraged patient to present for radiation treatment today. Patient states, "not today I will come back on Monday." Informed Heather, RT on L1 of this finding.

## 2018-12-16 NOTE — Progress Notes (Signed)
  Radiation Oncology         [phone] ________________________________  Name: [name] MRN: [id number]  Date: 11/29/2018  DOB: [birth year]  3D Planning Note   Prostate Brachytherapy Post-Implant Dosimetry  Diagnosis: 58 y.o. gentleman with Stage T1c adenocarcinoma of the prostate with Gleason score of 4+4, and PSA of 7.8  Narrative: On a previous date, [name] returned following prostate seed implantation for post implant planning. He underwent CT scan complex simulation to delineate the three-dimensional structures of the pelvis and demonstrate the radiation distribution.  Since that time, the seed localization, and complex isodose planning with dose volume histograms have now been completed.  Results:   Prostate Coverage - The dose of radiation delivered to the 90% or more of the prostate gland (D90) was 109.51% of the prescription dose. This exceeds our goal of greater than 90%. Rectal Sparing - The volume of rectal tissue receiving the prescription dose or higher was 0.0 cc. This falls under our thresholds tolerance of 1.0 cc.  Impression: The prostate seed implant appears to show adequate target coverage and appropriate rectal sparing.  Plan:  The patient will continue to follow with urology for ongoing PSA determinations. I would anticipate a high likelihood for local tumor control with minimal risk for rectal morbidity.  ________________________________  Sheral Apley [name], M.D.

## 2018-12-17 ENCOUNTER — Ambulatory Visit
Admission: RE | Admit: 2018-12-17 | Discharge: 2018-12-17 | Disposition: A | Discharge status: Home / Self Care | Payer: Medicare HMO | Source: Ambulatory Visit | Attending: Radiation Oncology | Admitting: Radiation Oncology

## 2018-12-17 ENCOUNTER — Other Ambulatory Visit: Payer: Self-pay

## 2018-12-17 DIAGNOSIS — I252 Old myocardial infarction: Secondary | ICD-10-CM | POA: Diagnosis not present

## 2018-12-17 DIAGNOSIS — E785 Hyperlipidemia, unspecified: Secondary | ICD-10-CM | POA: Diagnosis not present

## 2018-12-17 DIAGNOSIS — I509 Heart failure, unspecified: Secondary | ICD-10-CM | POA: Diagnosis not present

## 2018-12-17 DIAGNOSIS — F17210 Nicotine dependence, cigarettes, uncomplicated: Secondary | ICD-10-CM | POA: Diagnosis not present

## 2018-12-17 DIAGNOSIS — I2510 Atherosclerotic heart disease of native coronary artery without angina pectoris: Secondary | ICD-10-CM | POA: Diagnosis not present

## 2018-12-17 DIAGNOSIS — R8290 Unspecified abnormal findings in urine: Secondary | ICD-10-CM | POA: Diagnosis not present

## 2018-12-17 DIAGNOSIS — C61 Malignant neoplasm of prostate: Secondary | ICD-10-CM | POA: Diagnosis not present

## 2018-12-17 DIAGNOSIS — R300 Dysuria: Secondary | ICD-10-CM | POA: Diagnosis not present

## 2018-12-17 DIAGNOSIS — Z510 Encounter for antineoplastic radiation therapy: Secondary | ICD-10-CM | POA: Diagnosis not present

## 2018-12-17 DIAGNOSIS — J449 Chronic obstructive pulmonary disease, unspecified: Secondary | ICD-10-CM | POA: Diagnosis not present

## 2018-12-17 DIAGNOSIS — R3911 Hesitancy of micturition: Secondary | ICD-10-CM | POA: Diagnosis not present

## 2018-12-17 LAB — CULTURE, BLOOD (ROUTINE X 2)
Culture: NO GROWTH
Culture: NO GROWTH

## 2018-12-18 ENCOUNTER — Ambulatory Visit
Admission: RE | Admit: 2018-12-18 | Discharge: 2018-12-18 | Disposition: A | Discharge status: Home / Self Care | Payer: Medicare HMO | Source: Ambulatory Visit | Attending: Radiation Oncology | Admitting: Radiation Oncology

## 2018-12-18 ENCOUNTER — Other Ambulatory Visit: Payer: Self-pay

## 2018-12-18 DIAGNOSIS — Z510 Encounter for antineoplastic radiation therapy: Secondary | ICD-10-CM | POA: Diagnosis not present

## 2018-12-19 ENCOUNTER — Other Ambulatory Visit: Payer: Self-pay

## 2018-12-19 ENCOUNTER — Ambulatory Visit
Admission: RE | Admit: 2018-12-19 | Discharge: 2018-12-19 | Disposition: A | Discharge status: Home / Self Care | Payer: Medicare HMO | Source: Ambulatory Visit | Attending: Radiation Oncology | Admitting: Radiation Oncology

## 2018-12-19 DIAGNOSIS — Z510 Encounter for antineoplastic radiation therapy: Secondary | ICD-10-CM | POA: Diagnosis not present

## 2018-12-20 ENCOUNTER — Ambulatory Visit: Payer: Medicare HMO

## 2018-12-20 ENCOUNTER — Ambulatory Visit
Admission: RE | Admit: 2018-12-20 | Discharge: 2018-12-20 | Disposition: A | Discharge status: Home / Self Care | Payer: Medicare HMO | Source: Ambulatory Visit | Attending: Radiation Oncology | Admitting: Radiation Oncology

## 2018-12-20 DIAGNOSIS — Z510 Encounter for antineoplastic radiation therapy: Secondary | ICD-10-CM | POA: Diagnosis not present

## 2018-12-21 ENCOUNTER — Ambulatory Visit: Payer: Medicare HMO

## 2018-12-21 ENCOUNTER — Ambulatory Visit
Admission: RE | Admit: 2018-12-21 | Discharge: 2018-12-21 | Disposition: A | Discharge status: Home / Self Care | Payer: Medicare HMO | Source: Ambulatory Visit | Attending: Radiation Oncology | Admitting: Radiation Oncology

## 2018-12-21 DIAGNOSIS — Z510 Encounter for antineoplastic radiation therapy: Secondary | ICD-10-CM | POA: Diagnosis not present

## 2018-12-24 ENCOUNTER — Ambulatory Visit
Admission: RE | Admit: 2018-12-24 | Discharge: 2018-12-24 | Disposition: A | Discharge status: Home / Self Care | Payer: Medicare HMO | Source: Ambulatory Visit | Attending: Radiation Oncology | Admitting: Radiation Oncology

## 2018-12-24 ENCOUNTER — Ambulatory Visit: Payer: Medicare HMO

## 2018-12-24 ENCOUNTER — Other Ambulatory Visit: Payer: Self-pay

## 2018-12-24 DIAGNOSIS — Z510 Encounter for antineoplastic radiation therapy: Secondary | ICD-10-CM | POA: Diagnosis not present

## 2018-12-25 ENCOUNTER — Ambulatory Visit: Payer: Medicare HMO

## 2018-12-26 ENCOUNTER — Other Ambulatory Visit: Payer: Self-pay

## 2018-12-26 ENCOUNTER — Ambulatory Visit
Admission: RE | Admit: 2018-12-26 | Discharge: 2018-12-26 | Disposition: A | Discharge status: Home / Self Care | Payer: Medicare HMO | Source: Ambulatory Visit | Attending: Radiation Oncology | Admitting: Radiation Oncology

## 2018-12-26 ENCOUNTER — Encounter: Payer: Self-pay | Admitting: Medical Oncology

## 2018-12-26 ENCOUNTER — Encounter: Payer: Self-pay | Admitting: Radiation Oncology

## 2018-12-26 ENCOUNTER — Ambulatory Visit: Payer: Medicare HMO

## 2018-12-26 DIAGNOSIS — Z510 Encounter for antineoplastic radiation therapy: Secondary | ICD-10-CM | POA: Diagnosis not present

## 2019-01-10 ENCOUNTER — Other Ambulatory Visit: Payer: Self-pay | Admitting: Internal Medicine

## 2019-01-10 ENCOUNTER — Other Ambulatory Visit: Payer: Self-pay | Admitting: Cardiology

## 2019-01-14 ENCOUNTER — Ambulatory Visit (INDEPENDENT_AMBULATORY_CARE_PROVIDER_SITE_OTHER): Payer: Medicare HMO | Admitting: *Deleted

## 2019-01-14 DIAGNOSIS — I255 Ischemic cardiomyopathy: Secondary | ICD-10-CM

## 2019-01-14 DIAGNOSIS — I5042 Chronic combined systolic (congestive) and diastolic (congestive) heart failure: Secondary | ICD-10-CM

## 2019-01-15 ENCOUNTER — Telehealth: Payer: Self-pay

## 2019-01-15 NOTE — Telephone Encounter (Signed)
Left message for patient to remind of missed remote transmission.  

## 2019-01-17 LAB — CUP PACEART REMOTE DEVICE CHECK
Battery Remaining Longevity: 54 mo
Battery Remaining Percentage: 54 %
Battery Voltage: 2.98 V
Brady Statistic RV Percent Paced: 1 %
Date Time Interrogation Session: 20200819225514
HighPow Impedance: 66 Ohm
HighPow Impedance: 66 Ohm
Implantable Lead Implant Date: 20140929
Implantable Lead Location: 753860
Implantable Lead Model: 181
Implantable Lead Serial Number: 325827
Implantable Pulse Generator Implant Date: 20140929
Lead Channel Impedance Value: 340 Ohm
Lead Channel Pacing Threshold Amplitude: 1 V
Lead Channel Pacing Threshold Pulse Width: 0.5 ms
Lead Channel Sensing Intrinsic Amplitude: 11.3 mV
Lead Channel Setting Pacing Amplitude: 2.5 V
Lead Channel Setting Pacing Pulse Width: 0.5 ms
Lead Channel Setting Sensing Sensitivity: 0.5 mV
Pulse Gen Serial Number: 7132440

## 2019-01-20 NOTE — Progress Notes (Signed)
  Radiation Oncology         [phone]) [phone] ________________________________  Name: [name] MRN: [id number]  Date: 12/26/2018  DOB: 1960-11-02  End of Treatment Note  Diagnosis:   58 y.o. gentleman with Stage T1c adenocarcinoma of the prostate with Gleason score of 4+4, and PSA of 7.8     Indication for treatment:  Curative, Definitive Radiotherapy       Radiation treatment dates:   6/18-7/29/20  Site/dose:  1. Radioactive seeds were implanted into the prostate for a total of 110 Gy. 2. The prostate, seminal vesicles, and pelvic lymph nodes were boosted with 45 Gy in 25 fractions of 1.8 Gy, for a total dose of 155 Gy  Beams/energy:  1. The radioactive seeds were delivered under sagittal guidance with 3D imaging. 2. The prostate, seminal vesicles, and pelvic lymph nodes were initially treated using helical intensity modulated radiotherapy delivering 6 megavolt photons. Image guidance was performed with megavoltage CT studies prior to each fraction. He was immobilized with a body fix lower extremity mold.   Narrative: The patient tolerated radiation treatment relatively well. The patient experienced some minor urinary irritation and modest fatigue.  He experienced urinary obstructive symptoms, but, not significant enough to warrant Flomax.  Plan: The patient has completed radiation treatment. He will return to radiation oncology clinic for routine followup in one month. I advised him to call or return sooner if he has any questions or concerns related to his recovery or treatment. ________________________________  Sheral Apley. [name], M.D.

## 2019-01-22 NOTE — Progress Notes (Signed)
Remote ICD transmission.   

## 2019-01-24 ENCOUNTER — Ambulatory Visit
Admission: RE | Admit: 2019-01-24 | Discharge: 2019-01-24 | Disposition: A | Discharge status: Home / Self Care | Payer: Medicare HMO | Source: Ambulatory Visit | Attending: Urology | Admitting: Urology

## 2019-01-24 ENCOUNTER — Encounter: Payer: Self-pay | Admitting: Urology

## 2019-01-24 ENCOUNTER — Other Ambulatory Visit: Payer: Self-pay

## 2019-01-24 DIAGNOSIS — C61 Malignant neoplasm of prostate: Secondary | ICD-10-CM

## 2019-01-24 NOTE — Progress Notes (Signed)
Radiation Oncology         [phone] ________________________________  Name: Claiborne Stroble MRN: [id number]  Date: 01/24/2019  DOB: 06-20-1960  Post Treatment Note  CC: Lucianne Lei, MD  Melrose Nakayama, *  Diagnosis:   58 y.o. gentleman with Stage T1c adenocarcinoma of the prostate with Gleason score of 4+4, and PSA of 7.8     Interval Since Last Radiation:  4 weeks  10/24/18: Insertion of radioactive I-125 seeds into the prostate gland;110 Gy, boost therapy.  11/15/18-12/26/18:  The prostate, seminal vesicles, and pelvic lymph nodes were boosted with 45 Gy in 25 fractions of 1.8 Gy, for a total dose of 155 Gy  Narrative:  I spoke with the patient to conduct his routine scheduled 1 month follow up visit via telephone to spare the patient unnecessary potential exposure in the healthcare setting during the current COVID-19 pandemic.  The patient was notified in advance and gave permission to proceed with this visit format. He tolerated radiation treatment relatively well. The patient experienced some minor urinary irritation and modest fatigue.  He was taking Flomax but stopped after becoming hypotensive. Ended up having to be hospitalized for ARI secondary to hypovolemia during the last week of radiation but was discharged in stable condition and able to complete his full course of treatment.                             On review of systems, the patient states that he is doing well overall.  He reports that he is feeling much stronger since his discharge home from the hospital but continues with mild fatigue.  He specifically denies dysuria, gross hematuria, excessive daytime frequency, incomplete bladder emptying or incontinence.  He continues with a weak flow of stream, mostly at night but occasionally during the day.  He is not straining to void and feels that he is emptying his bladder well for the most part.  He denies any recent fevers, chills, night sweats or unintentional weight  loss.  He reports a healthy appetite and is maintaining his weight.  He denies abdominal pain, nausea, vomiting, diarrhea or constipation.  He has not had any recent cough, hemoptysis or increased shortness of breath.  Overall, he is pleased with his progress to date.  On review of his chart from his recent hospitalization, it was noted that he is overdue for follow-up with medical oncology regarding his history of lung cancer.  He was last seen with Dr. [name] in October 2019 and CT chest at that time indicated suspected disease progression with recommendations for repeat CT chest in 3 months.  The patient has been lost to follow-up since that time.  He reports that he would like to get back on schedule with Dr. [name] but does not have any current follow-up scheduled at this time.  ALLERGIES:  has No Known Allergies.  Meds: Current Outpatient Medications  Medication Sig Dispense Refill   carvedilol (COREG) 6.25 MG tablet Take 6.25 mg by mouth 2 (two) times a day.      rosuvastatin (CRESTOR) 40 MG tablet Take 40 mg by mouth daily.      tiotropium (SPIRIVA HANDIHALER) 18 MCG inhalation capsule INHALE THE CONTENTS OF 1 CAPSULE EVERY DAY 90 capsule 0   WIXELA INHUB 100-50 MCG/DOSE AEPB INHALE CONTENTS OF 1 BLISTER USING DISKUS TWO TIMES DAILY 180 each 0   prochlorperazine (COMPAZINE) 10 MG tablet Take 1 tablet (10 mg total)  by mouth every 6 (six) hours as needed for nausea or vomiting. (Patient not taking: Reported on 01/24/2019) 30 tablet 0   No current facility-administered medications for this encounter.     Physical Findings:  vitals were not taken for this visit.   /Unable to assess due to telephone follow-up visit format.  Lab Findings: Lab Results  Component Value Date   WBC 4.4 12/13/2018   HGB 8.7 (L) 12/13/2018   HCT 25.3 (L) 12/13/2018   MCV 81.9 12/13/2018   PLT 268 12/13/2018     Radiographic Findings: No results found.  Impression/Plan: 1. 58 y.o. gentleman  with Stage T1c adenocarcinoma of the prostate with Gleason score of 4+4, and PSA of 7.8.     He will continue to follow up with urology for ongoing PSA determinations and had a follow up visit with Dr. Tresa Moore earlier today. He understands what to expect with regards to PSA monitoring going forward. I will look forward to following his response to treatment via correspondence with urology, and would be happy to continue to participate in his care if clinically indicated. I talked to the patient about what to expect in the future, including his risk for erectile dysfunction and rectal bleeding. I encouraged him to call or return to the office if he has any questions regarding his previous radiation or possible radiation side effects. He was comfortable with this plan and will follow up as needed. 2. Stage IA non-small cell lung cancer of the RLL initially diagnosed and treated in February 2015 followed by disease recurrence in the RLL with invasive adenocarcinoma in 10/2015 treated with SBRT ([place]).  He tolerated his treatment well and was being followed regularly with Dr. [name] for continued observation.  He was last seen with Dr. [name] in 02/2018 and at that time, CT Chest indicated some concern for possible disease progression and the recommendation was to proceed with a repeat CT chest in 3 months which did not happen.  Unfortunately, he has been lost to follow-up since that time.  He has also not seen his pulmonologist, Dr. Chase Caller, in the past year for management of his underlying COPD.  He is interested in getting back on track for follow-up with Dr. [name] so we will reach out and see if we can help him to get this coordinated.Nicholos Johns, PA-C

## 2019-01-25 ENCOUNTER — Encounter: Payer: Self-pay | Admitting: *Deleted

## 2019-01-25 DIAGNOSIS — C3491 Malignant neoplasm of unspecified part of right bronchus or lung: Secondary | ICD-10-CM

## 2019-01-25 NOTE — Progress Notes (Signed)
Oncology Nurse Navigator Documentation  Oncology Nurse Navigator Flowsheets 01/25/2019  Navigator Location [facility]  Referral Date to RadOnc/MedOnc -  Navigator Encounter Type Telephone/I called Mr. Trotter today.  I gave him an appt for labs and follow up with Cassi PA.  He verbalized understanding of appt time and place.   Telephone Outgoing Call  Confirmed Diagnosis Date -  Surgery Date -  Treatment Initiated Date -  Patient Visit Type -  Treatment Phase Follow-up  Barriers/Navigation Needs Coordination of Care;Education  Education Other  Interventions Coordination of Care;Education  Coordination of Care Appts  Education Method Verbal  Support Groups/Services -  Acuity Level 2  Acuity Level 1 -  Acuity Level 2 -  Time Spent with Patient 45  Time Spent with Patient (Retired) -

## 2019-01-25 NOTE — Progress Notes (Signed)
I reached out to authorization coordinator to get CT chest auth and scheduled.

## 2019-01-25 NOTE — Progress Notes (Signed)
Oncology Nurse Navigator Documentation  Oncology Nurse Navigator Flowsheets 01/25/2019  Navigator Location [facility]  Referral Date to RadOnc/MedOnc -  Navigator Encounter Type Other/I received a message from Dr. [name].  He would like to see the patient next week with a scan.  I updated scheduling dept.  Telephone -  Confirmed Diagnosis Date -  Surgery Date -  Treatment Initiated Date -  Patient Visit Type -  Treatment Phase Other  Barriers/Navigation Needs Coordination of Care  Education -  Interventions Coordination of Care  Coordination of Care Other  Education Method -  Support Groups/Services -  Acuity Level 3  Acuity Level 1 -  Acuity Level 2 -  Time Spent with Patient 45  Time Spent with Patient (Retired) -

## 2019-01-30 ENCOUNTER — Ambulatory Visit (HOSPITAL_COMMUNITY): Admission: RE | Admit: 2019-01-30 | Payer: Medicare HMO | Source: Ambulatory Visit

## 2019-01-30 ENCOUNTER — Inpatient Hospital Stay: Payer: Medicare HMO

## 2019-01-30 ENCOUNTER — Telehealth: Payer: Self-pay | Admitting: Internal Medicine

## 2019-01-30 NOTE — Telephone Encounter (Signed)
R/s appt per 9/2 sch message - pt aware of appt date and time

## 2019-01-31 ENCOUNTER — Inpatient Hospital Stay: Payer: Medicare HMO | Admitting: Physician Assistant

## 2019-02-05 ENCOUNTER — Inpatient Hospital Stay: Payer: Medicare HMO | Attending: Physician Assistant

## 2019-02-05 ENCOUNTER — Encounter (HOSPITAL_COMMUNITY): Payer: Self-pay

## 2019-02-05 ENCOUNTER — Other Ambulatory Visit: Payer: Self-pay

## 2019-02-05 ENCOUNTER — Ambulatory Visit (HOSPITAL_COMMUNITY)
Admission: RE | Admit: 2019-02-05 | Discharge: 2019-02-05 | Disposition: A | Discharge status: Home / Self Care | Payer: Medicare HMO | Source: Ambulatory Visit | Attending: Internal Medicine | Admitting: Internal Medicine

## 2019-02-05 DIAGNOSIS — Z79899 Other long term (current) drug therapy: Secondary | ICD-10-CM | POA: Insufficient documentation

## 2019-02-05 DIAGNOSIS — C3491 Malignant neoplasm of unspecified part of right bronchus or lung: Secondary | ICD-10-CM | POA: Diagnosis present

## 2019-02-05 DIAGNOSIS — C61 Malignant neoplasm of prostate: Secondary | ICD-10-CM | POA: Diagnosis not present

## 2019-02-05 DIAGNOSIS — I509 Heart failure, unspecified: Secondary | ICD-10-CM | POA: Diagnosis not present

## 2019-02-05 DIAGNOSIS — I252 Old myocardial infarction: Secondary | ICD-10-CM | POA: Insufficient documentation

## 2019-02-05 DIAGNOSIS — I2510 Atherosclerotic heart disease of native coronary artery without angina pectoris: Secondary | ICD-10-CM | POA: Insufficient documentation

## 2019-02-05 DIAGNOSIS — C3431 Malignant neoplasm of lower lobe, right bronchus or lung: Secondary | ICD-10-CM | POA: Diagnosis present

## 2019-02-05 DIAGNOSIS — R5383 Other fatigue: Secondary | ICD-10-CM | POA: Insufficient documentation

## 2019-02-05 DIAGNOSIS — J439 Emphysema, unspecified: Secondary | ICD-10-CM | POA: Insufficient documentation

## 2019-02-05 DIAGNOSIS — Z923 Personal history of irradiation: Secondary | ICD-10-CM | POA: Insufficient documentation

## 2019-02-05 DIAGNOSIS — I255 Ischemic cardiomyopathy: Secondary | ICD-10-CM | POA: Insufficient documentation

## 2019-02-05 DIAGNOSIS — E785 Hyperlipidemia, unspecified: Secondary | ICD-10-CM | POA: Diagnosis not present

## 2019-02-05 DIAGNOSIS — I700 Atherosclerosis of aorta: Secondary | ICD-10-CM | POA: Insufficient documentation

## 2019-02-05 LAB — CBC WITH DIFFERENTIAL (CANCER CENTER ONLY)
Abs Immature Granulocytes: 0.01 10*3/uL (ref 0.00–0.07)
Basophils Absolute: 0 10*3/uL (ref 0.0–0.1)
Basophils Relative: 0 %
Eosinophils Absolute: 0.3 10*3/uL (ref 0.0–0.5)
Eosinophils Relative: 6 %
HCT: 31.6 % — ABNORMAL LOW (ref 39.0–52.0)
Hemoglobin: 10.4 g/dL — ABNORMAL LOW (ref 13.0–17.0)
Immature Granulocytes: 0 %
Lymphocytes Relative: 17 %
Lymphs Abs: 0.9 10*3/uL (ref 0.7–4.0)
MCH: 27.6 pg (ref 26.0–34.0)
MCHC: 32.9 g/dL (ref 30.0–36.0)
MCV: 83.8 fL (ref 80.0–100.0)
Monocytes Absolute: 0.5 10*3/uL (ref 0.1–1.0)
Monocytes Relative: 10 %
Neutro Abs: 3.7 10*3/uL (ref 1.7–7.7)
Neutrophils Relative %: 67 %
Platelet Count: 281 10*3/uL (ref 150–400)
RBC: 3.77 MIL/uL — ABNORMAL LOW (ref 4.22–5.81)
RDW: 15.7 % — ABNORMAL HIGH (ref 11.5–15.5)
WBC Count: 5.5 10*3/uL (ref 4.0–10.5)
nRBC: 0 % (ref 0.0–0.2)

## 2019-02-05 LAB — CMP (CANCER CENTER ONLY)
ALT: 18 U/L (ref 0–44)
AST: 15 U/L (ref 15–41)
Albumin: 3.5 g/dL (ref 3.5–5.0)
Alkaline Phosphatase: 53 U/L (ref 38–126)
Anion gap: 7 (ref 5–15)
BUN: 17 mg/dL (ref 6–20)
CO2: 25 mmol/L (ref 22–32)
Calcium: 9.6 mg/dL (ref 8.9–10.3)
Chloride: 103 mmol/L (ref 98–111)
Creatinine: 1.07 mg/dL (ref 0.61–1.24)
GFR, Est AFR Am: >60 mL/min (ref >60)
GFR, Estimated: >60 mL/min (ref >60)
Glucose, Bld: 112 mg/dL — ABNORMAL HIGH (ref 70–99)
Potassium: 4.8 mmol/L (ref 3.5–5.1)
Sodium: 135 mmol/L (ref 135–145)
Total Bilirubin: 0.3 mg/dL (ref 0.3–1.2)
Total Protein: 8 g/dL (ref 6.5–8.1)

## 2019-02-05 MED ORDER — SODIUM CHLORIDE (PF) 0.9 % IJ SOLN
INTRAMUSCULAR | Status: AC
  Filled 2019-02-05: qty 50

## 2019-02-05 MED ORDER — IOHEXOL 300 MG/ML  SOLN
75.0000 mL | Freq: Once | INTRAMUSCULAR | Status: AC | PRN
  Administered 2019-02-05: 75 mL via INTRAVENOUS

## 2019-02-07 ENCOUNTER — Inpatient Hospital Stay (HOSPITAL_BASED_OUTPATIENT_CLINIC_OR_DEPARTMENT_OTHER): Payer: Medicare HMO | Admitting: Physician Assistant

## 2019-02-07 ENCOUNTER — Telehealth: Payer: Self-pay | Admitting: Internal Medicine

## 2019-02-07 ENCOUNTER — Other Ambulatory Visit: Payer: Self-pay

## 2019-02-07 ENCOUNTER — Encounter: Payer: Self-pay | Admitting: Physician Assistant

## 2019-02-07 VITALS — BP 104/61 | HR 82 | Temp 98.5°F | Resp 18 | Ht 68.0 in | Wt 116.2 lb

## 2019-02-07 DIAGNOSIS — C61 Malignant neoplasm of prostate: Secondary | ICD-10-CM

## 2019-02-07 DIAGNOSIS — C3431 Malignant neoplasm of lower lobe, right bronchus or lung: Secondary | ICD-10-CM | POA: Diagnosis not present

## 2019-02-07 DIAGNOSIS — Z7189 Other specified counseling: Secondary | ICD-10-CM | POA: Insufficient documentation

## 2019-02-07 DIAGNOSIS — C3491 Malignant neoplasm of unspecified part of right bronchus or lung: Secondary | ICD-10-CM
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
## 2019-02-07 NOTE — Telephone Encounter (Signed)
Scheduled appt per 9/10 los - gave patient AVS to patient

## 2019-02-15 ENCOUNTER — Ambulatory Visit (HOSPITAL_COMMUNITY)
Admission: RE | Admit: 2019-02-15 | Discharge: 2019-02-15 | Disposition: A | Discharge status: Home / Self Care | Payer: Medicare HMO | Source: Ambulatory Visit | Attending: Physician Assistant | Admitting: Physician Assistant

## 2019-02-15 ENCOUNTER — Other Ambulatory Visit: Payer: Self-pay

## 2019-02-15 DIAGNOSIS — C3491 Malignant neoplasm of unspecified part of right bronchus or lung: Secondary | ICD-10-CM

## 2019-02-19 ENCOUNTER — Encounter (HOSPITAL_COMMUNITY)
Admission: RE | Admit: 2019-02-19 | Discharge: 2019-02-19 | Disposition: A | Discharge status: Home / Self Care | Payer: Medicare HMO | Source: Ambulatory Visit | Attending: Physician Assistant | Admitting: Physician Assistant

## 2019-02-19 ENCOUNTER — Inpatient Hospital Stay: Payer: Medicare HMO

## 2019-02-19 ENCOUNTER — Other Ambulatory Visit: Payer: Self-pay

## 2019-02-19 ENCOUNTER — Inpatient Hospital Stay: Payer: Medicare HMO | Admitting: Internal Medicine

## 2019-02-19 DIAGNOSIS — C3491 Malignant neoplasm of unspecified part of right bronchus or lung: Secondary | ICD-10-CM | POA: Insufficient documentation

## 2019-02-19 DIAGNOSIS — C3431 Malignant neoplasm of lower lobe, right bronchus or lung: Secondary | ICD-10-CM | POA: Diagnosis not present

## 2019-02-19 LAB — GLUCOSE, CAPILLARY: Glucose-Capillary: 85 mg/dL (ref 70–99)

## 2019-02-19 MED ORDER — FLUDEOXYGLUCOSE F - 18 (FDG) INJECTION
5.7000 | Freq: Once | INTRAVENOUS | Status: AC | PRN
  Administered 2019-02-19: 5.7 via INTRAVENOUS

## 2019-02-20 ENCOUNTER — Telehealth: Payer: Self-pay | Admitting: Internal Medicine

## 2019-02-20 NOTE — Telephone Encounter (Signed)
Confirmed 9/24 f/u with patient.

## 2019-02-21 ENCOUNTER — Other Ambulatory Visit: Payer: Self-pay

## 2019-02-21 ENCOUNTER — Inpatient Hospital Stay (HOSPITAL_BASED_OUTPATIENT_CLINIC_OR_DEPARTMENT_OTHER): Payer: Medicare HMO | Admitting: Internal Medicine

## 2019-02-21 ENCOUNTER — Encounter: Payer: Self-pay | Admitting: Internal Medicine

## 2019-02-21 VITALS — BP 112/71 | HR 73 | Temp 98.5°F | Resp 18 | Ht 68.0 in | Wt 119.2 lb

## 2019-02-21 DIAGNOSIS — C3431 Malignant neoplasm of lower lobe, right bronchus or lung: Secondary | ICD-10-CM | POA: Diagnosis not present

## 2019-02-21 DIAGNOSIS — C61 Malignant neoplasm of prostate: Secondary | ICD-10-CM | POA: Diagnosis not present

## 2019-02-21 DIAGNOSIS — C3490 Malignant neoplasm of unspecified part of unspecified bronchus or lung: Secondary | ICD-10-CM | POA: Diagnosis not present

## 2019-02-21 DIAGNOSIS — J431 Panlobular emphysema: Secondary | ICD-10-CM | POA: Diagnosis not present
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
## 2019-02-22 ENCOUNTER — Telehealth: Payer: Self-pay | Admitting: Internal Medicine

## 2019-02-22 NOTE — Telephone Encounter (Signed)
Confirmed 10/18 lab/fu with patient.  Ct scan and biopsy already on schedule. Patient is aware.

## 2019-03-01 ENCOUNTER — Other Ambulatory Visit (HOSPITAL_COMMUNITY)
Admission: RE | Admit: 2019-03-01 | Discharge: 2019-03-01 | Disposition: A | Discharge status: Home / Self Care | Payer: Medicare HMO | Source: Ambulatory Visit | Attending: Internal Medicine | Admitting: Internal Medicine

## 2019-03-01 DIAGNOSIS — Z01812 Encounter for preprocedural laboratory examination: Secondary | ICD-10-CM | POA: Insufficient documentation

## 2019-03-01 DIAGNOSIS — Z20828 Contact with and (suspected) exposure to other viral communicable diseases: Secondary | ICD-10-CM | POA: Diagnosis not present

## 2019-03-03 LAB — NOVEL CORONAVIRUS, NAA (HOSP ORDER, SEND-OUT TO REF LAB; TAT 18-24 HRS): SARS-CoV-2, NAA: NOT DETECTED

## 2019-03-04 ENCOUNTER — Other Ambulatory Visit: Payer: Self-pay | Admitting: Radiology

## 2019-03-04 ENCOUNTER — Encounter (HOSPITAL_COMMUNITY): Payer: Self-pay

## 2019-03-04 ENCOUNTER — Other Ambulatory Visit: Payer: Self-pay

## 2019-03-04 ENCOUNTER — Ambulatory Visit (HOSPITAL_COMMUNITY)
Admission: RE | Admit: 2019-03-04 | Discharge: 2019-03-04 | Disposition: A | Discharge status: Home / Self Care | Payer: Medicare HMO | Source: Ambulatory Visit | Attending: Internal Medicine | Admitting: Internal Medicine

## 2019-03-04 DIAGNOSIS — I2510 Atherosclerotic heart disease of native coronary artery without angina pectoris: Secondary | ICD-10-CM | POA: Insufficient documentation

## 2019-03-04 DIAGNOSIS — C61 Malignant neoplasm of prostate: Secondary | ICD-10-CM | POA: Insufficient documentation

## 2019-03-04 DIAGNOSIS — Z79899 Other long term (current) drug therapy: Secondary | ICD-10-CM | POA: Insufficient documentation

## 2019-03-04 DIAGNOSIS — C3490 Malignant neoplasm of unspecified part of unspecified bronchus or lung: Secondary | ICD-10-CM

## 2019-03-04 DIAGNOSIS — I252 Old myocardial infarction: Secondary | ICD-10-CM | POA: Diagnosis not present

## 2019-03-04 DIAGNOSIS — Z7982 Long term (current) use of aspirin: Secondary | ICD-10-CM | POA: Insufficient documentation

## 2019-03-04 MED ORDER — IOHEXOL 300 MG/ML  SOLN
75.0000 mL | Freq: Once | INTRAMUSCULAR | Status: AC | PRN
  Administered 2019-03-04: 14:00:00 75 mL via INTRAVENOUS

## 2019-03-04 MED ORDER — SODIUM CHLORIDE (PF) 0.9 % IJ SOLN
INTRAMUSCULAR | Status: AC
  Filled 2019-03-04: qty 50

## 2019-03-05 ENCOUNTER — Other Ambulatory Visit: Payer: Self-pay

## 2019-03-05 ENCOUNTER — Encounter (HOSPITAL_COMMUNITY): Payer: Self-pay | Admitting: Radiology

## 2019-03-05 ENCOUNTER — Ambulatory Visit (HOSPITAL_COMMUNITY)
Admission: RE | Admit: 2019-03-05 | Discharge: 2019-03-05 | Disposition: A | Discharge status: Home / Self Care | Payer: Medicare HMO | Source: Ambulatory Visit | Attending: Internal Medicine | Admitting: Internal Medicine

## 2019-03-05 ENCOUNTER — Ambulatory Visit (HOSPITAL_COMMUNITY)
Admission: RE | Admit: 2019-03-05 | Discharge: 2019-03-05 | Disposition: A | Discharge status: Home / Self Care | Payer: Medicare HMO | Source: Ambulatory Visit | Attending: Interventional Radiology | Admitting: Interventional Radiology

## 2019-03-05 ENCOUNTER — Encounter (HOSPITAL_COMMUNITY): Payer: Self-pay

## 2019-03-05 DIAGNOSIS — Z923 Personal history of irradiation: Secondary | ICD-10-CM | POA: Diagnosis not present

## 2019-03-05 DIAGNOSIS — I509 Heart failure, unspecified: Secondary | ICD-10-CM | POA: Diagnosis not present

## 2019-03-05 DIAGNOSIS — J939 Pneumothorax, unspecified: Secondary | ICD-10-CM

## 2019-03-05 DIAGNOSIS — C61 Malignant neoplasm of prostate: Secondary | ICD-10-CM | POA: Diagnosis not present

## 2019-03-05 DIAGNOSIS — Z79899 Other long term (current) drug therapy: Secondary | ICD-10-CM | POA: Diagnosis not present

## 2019-03-05 DIAGNOSIS — Z7951 Long term (current) use of inhaled steroids: Secondary | ICD-10-CM | POA: Diagnosis not present

## 2019-03-05 DIAGNOSIS — Z9981 Dependence on supplemental oxygen: Secondary | ICD-10-CM | POA: Insufficient documentation

## 2019-03-05 DIAGNOSIS — Z7982 Long term (current) use of aspirin: Secondary | ICD-10-CM | POA: Insufficient documentation

## 2019-03-05 DIAGNOSIS — C3490 Malignant neoplasm of unspecified part of unspecified bronchus or lung: Secondary | ICD-10-CM | POA: Insufficient documentation

## 2019-03-05 DIAGNOSIS — R918 Other nonspecific abnormal finding of lung field: Secondary | ICD-10-CM | POA: Diagnosis not present

## 2019-03-05 DIAGNOSIS — Z955 Presence of coronary angioplasty implant and graft: Secondary | ICD-10-CM | POA: Diagnosis not present

## 2019-03-05 DIAGNOSIS — J95811 Postprocedural pneumothorax: Secondary | ICD-10-CM | POA: Insufficient documentation

## 2019-03-05 DIAGNOSIS — Z951 Presence of aortocoronary bypass graft: Secondary | ICD-10-CM | POA: Insufficient documentation

## 2019-03-05 DIAGNOSIS — Z95810 Presence of automatic (implantable) cardiac defibrillator: Secondary | ICD-10-CM | POA: Diagnosis not present

## 2019-03-05 DIAGNOSIS — I25810 Atherosclerosis of coronary artery bypass graft(s) without angina pectoris: Secondary | ICD-10-CM | POA: Diagnosis not present

## 2019-03-05 DIAGNOSIS — F17210 Nicotine dependence, cigarettes, uncomplicated: Secondary | ICD-10-CM | POA: Insufficient documentation

## 2019-03-05 DIAGNOSIS — J449 Chronic obstructive pulmonary disease, unspecified: Secondary | ICD-10-CM | POA: Diagnosis not present

## 2019-03-05 DIAGNOSIS — I255 Ischemic cardiomyopathy: Secondary | ICD-10-CM | POA: Diagnosis not present

## 2019-03-05 DIAGNOSIS — I252 Old myocardial infarction: Secondary | ICD-10-CM | POA: Insufficient documentation

## 2019-03-05 DIAGNOSIS — Z8249 Family history of ischemic heart disease and other diseases of the circulatory system: Secondary | ICD-10-CM | POA: Insufficient documentation

## 2019-03-05 DIAGNOSIS — Z809 Family history of malignant neoplasm, unspecified: Secondary | ICD-10-CM | POA: Diagnosis not present

## 2019-03-05 LAB — CBC
HCT: 38.6 % — ABNORMAL LOW (ref 39.0–52.0)
Hemoglobin: 12.5 g/dL — ABNORMAL LOW (ref 13.0–17.0)
MCH: 28.3 pg (ref 26.0–34.0)
MCHC: 32.4 g/dL (ref 30.0–36.0)
MCV: 87.5 fL (ref 80.0–100.0)
Platelets: 363 10*3/uL (ref 150–400)
RBC: 4.41 MIL/uL (ref 4.22–5.81)
RDW: 15.9 % — ABNORMAL HIGH (ref 11.5–15.5)
WBC: 4.6 10*3/uL (ref 4.0–10.5)
nRBC: 0 % (ref 0.0–0.2)

## 2019-03-05 LAB — PROTIME-INR
INR: 1.1 (ref 0.8–1.2)
Prothrombin Time: 13.7 seconds (ref 11.4–15.2)

## 2019-03-05 MED ORDER — HYDROCODONE-ACETAMINOPHEN 5-325 MG PO TABS
ORAL_TABLET | ORAL | Status: AC
  Filled 2019-03-05: qty 1

## 2019-03-05 MED ORDER — HYDROCODONE-ACETAMINOPHEN 5-325 MG PO TABS
1.0000 | ORAL_TABLET | Freq: Once | ORAL | Status: AC
  Administered 2019-03-05: 15:00:00 1 via ORAL
  Filled 2019-03-05: qty 1

## 2019-03-05 MED ORDER — SODIUM CHLORIDE 0.9 % IV SOLN: INTRAVENOUS | Status: DC

## 2019-03-05 MED ORDER — SODIUM CHLORIDE 0.9 % IV SOLN
INTRAVENOUS | Status: AC | PRN
  Administered 2019-03-05: 10 mL/h via INTRAVENOUS

## 2019-03-05 MED ORDER — FENTANYL CITRATE (PF) 100 MCG/2ML IJ SOLN
INTRAMUSCULAR | Status: AC | PRN
  Administered 2019-03-05: 12.5 ug via INTRAVENOUS

## 2019-03-05 MED ORDER — FENTANYL CITRATE (PF) 100 MCG/2ML IJ SOLN
INTRAMUSCULAR | Status: AC
  Filled 2019-03-05: qty 4

## 2019-03-05 MED ORDER — KETOROLAC TROMETHAMINE 15 MG/ML IJ SOLN
INTRAMUSCULAR | Status: AC
  Administered 2019-03-05: 30 mg
  Filled 2019-03-05: qty 2

## 2019-03-05 MED ORDER — MIDAZOLAM HCL 2 MG/2ML IJ SOLN
INTRAMUSCULAR | Status: AC | PRN
  Administered 2019-03-05: 0.5 mg via INTRAVENOUS

## 2019-03-05 MED ORDER — HYDROCODONE-ACETAMINOPHEN 5-325 MG PO TABS: 1.0000 | ORAL_TABLET | Freq: Once | ORAL | Status: DC

## 2019-03-05 MED ORDER — HYDROCODONE-ACETAMINOPHEN 5-325 MG PO TABS
ORAL_TABLET | ORAL | Status: AC
  Administered 2019-03-05: 1 via ORAL
  Filled 2019-03-05: qty 1

## 2019-03-05 MED ORDER — MIDAZOLAM HCL 2 MG/2ML IJ SOLN
INTRAMUSCULAR | Status: AC
  Filled 2019-03-05: qty 4

## 2019-03-05 NOTE — Progress Notes (Signed)
Patient stating he feels some relief for IV Toradol.  Able to tolerate ambulation to bathroom
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
## 2019-03-05 NOTE — Progress Notes (Unsigned)
[employer] Male, 58 y.o., 1961-05-20 MRN:  [id number] Phone:  [phone] Jerilynn Mages) PCP:  Lucianne Lei, MD Primary Cvg:  [employer] Hmo Next Appt With Oncology 03/07/2019 at 10:30 AM  RE: CT Biopsy Received: 1 week ago Message Contents  Arne Cleveland, MD  Braeden Kennan, [place]   CT core RLL PET+ lesion  See CT 02/05/19 [address]   [facility]   Previous Messages  ----- Message -----  From: Garth Bigness D  Sent: 02/21/2019  9:37 AM EDT  To: [facility] Procedure Requests  Subject: CT Biopsy                     Procedure:  CT Biopsy   Reason:  CT-guided core biopsy of hypermetabolic right lung mass, Malignant neoplasm of unspecified part of unspecified bronchus or lung   History: NM PET, NM Bone scan, CT in computer   Dr. [name], [place]

## 2019-03-05 NOTE — Procedures (Signed)
Interventional Radiology Procedure Note  Procedure: CT guided biopsy of right LL pleural based mass.  Complications: None Recommendations: - Bedrest until CXR cleared.  Minimize talking, coughing or otherwise straining.  - Follow up 1 hr CXR pending  - NPO until CXR cleared  Signed,  Corrie Mckusick, DO
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
## 2019-03-05 NOTE — Progress Notes (Signed)
Pain unrelieved with oral vicodin.  Spoke with Dr. Earleen Newport.  Orders for 30mg  IV toradol and ice pack

## 2019-03-05 NOTE — Discharge Instructions (Signed)
Needle Biopsy, Care After These instructions tell you how to care for yourself after your procedure. Your doctor may also give you more specific instructions. Call your doctor if you have any problems or questions. What can I expect after the procedure? After the procedure, it is common to have:  Soreness.  Bruising.  Mild pain. Follow these instructions at home:   Return to your normal activities as told by your doctor. Ask your doctor what activities are safe for you.  Take over-the-counter and prescription medicines only as told by your doctor.  Wash your hands with soap and water before you change your bandage (dressing). If you cannot use soap and water, use hand sanitizer.  Follow instructions from your doctor about: ? How to take care of your puncture site. ? When and how to change your bandage. ? When to remove your bandage.  Check your puncture site every day for signs of infection. Watch for: ? Redness, swelling, or pain. ? Fluid or blood. ? Pus or a bad smell. ? Warmth.  Do not take baths, swim, or use a hot tub until your doctor approves. Ask your doctor if you may take showers. You may only be allowed to take sponge baths.  Keep all follow-up visits as told by your doctor. This is important. Contact a doctor if you have:  A fever.  Redness, swelling, or pain at the puncture site, and it lasts longer than a few days.  Fluid, blood, or pus coming from the puncture site.  Warmth coming from the puncture site. Get help right away if:  You have a lot of bleeding from the puncture site. Summary  After the procedure, it is common to have soreness, bruising, or mild pain at the puncture site.  Check your puncture site every day for signs of infection, such as redness, swelling, or pain.  Get help right away if you have severe bleeding from your puncture site. This information is not intended to replace advice given to you by your health care provider. Make  sure you discuss any questions you have with your health care provider. Document Released: 04/28/2008 Document Revised: 05/29/2017 Document Reviewed: 05/29/2017 Elsevier Patient Education  2020 [employer].

## 2019-03-07 ENCOUNTER — Inpatient Hospital Stay: Payer: Medicare HMO

## 2019-03-07 ENCOUNTER — Inpatient Hospital Stay: Payer: Medicare HMO | Attending: Physician Assistant | Admitting: Internal Medicine

## 2019-03-07 ENCOUNTER — Encounter: Payer: Self-pay | Admitting: *Deleted

## 2019-03-07 ENCOUNTER — Telehealth: Payer: Self-pay | Admitting: Internal Medicine

## 2019-03-07 ENCOUNTER — Other Ambulatory Visit: Payer: Self-pay | Admitting: Internal Medicine

## 2019-03-07 ENCOUNTER — Encounter: Payer: Self-pay | Admitting: Internal Medicine

## 2019-03-07 ENCOUNTER — Other Ambulatory Visit: Payer: Self-pay

## 2019-03-07 ENCOUNTER — Other Ambulatory Visit: Payer: Self-pay | Admitting: *Deleted

## 2019-03-07 VITALS — BP 106/67 | HR 85 | Temp 98.5°F | Resp 18 | Ht 68.0 in | Wt 117.0 lb

## 2019-03-07 DIAGNOSIS — I509 Heart failure, unspecified: Secondary | ICD-10-CM | POA: Diagnosis not present

## 2019-03-07 DIAGNOSIS — R634 Abnormal weight loss: Secondary | ICD-10-CM | POA: Insufficient documentation

## 2019-03-07 DIAGNOSIS — J939 Pneumothorax, unspecified: Secondary | ICD-10-CM | POA: Insufficient documentation

## 2019-03-07 DIAGNOSIS — Z7189 Other specified counseling: Secondary | ICD-10-CM

## 2019-03-07 DIAGNOSIS — R0609 Other forms of dyspnea: Secondary | ICD-10-CM | POA: Diagnosis not present

## 2019-03-07 DIAGNOSIS — C61 Malignant neoplasm of prostate: Secondary | ICD-10-CM | POA: Insufficient documentation

## 2019-03-07 DIAGNOSIS — Z79899 Other long term (current) drug therapy: Secondary | ICD-10-CM | POA: Diagnosis not present

## 2019-03-07 DIAGNOSIS — I252 Old myocardial infarction: Secondary | ICD-10-CM | POA: Insufficient documentation

## 2019-03-07 DIAGNOSIS — C3491 Malignant neoplasm of unspecified part of right bronchus or lung: Secondary | ICD-10-CM

## 2019-03-07 DIAGNOSIS — R5383 Other fatigue: Secondary | ICD-10-CM | POA: Insufficient documentation

## 2019-03-07 DIAGNOSIS — E785 Hyperlipidemia, unspecified: Secondary | ICD-10-CM | POA: Diagnosis not present

## 2019-03-07 DIAGNOSIS — I700 Atherosclerosis of aorta: Secondary | ICD-10-CM | POA: Diagnosis not present

## 2019-03-07 DIAGNOSIS — I2510 Atherosclerotic heart disease of native coronary artery without angina pectoris: Secondary | ICD-10-CM | POA: Diagnosis not present

## 2019-03-07 DIAGNOSIS — J479 Bronchiectasis, uncomplicated: Secondary | ICD-10-CM | POA: Insufficient documentation

## 2019-03-07 DIAGNOSIS — R591 Generalized enlarged lymph nodes: Secondary | ICD-10-CM | POA: Insufficient documentation

## 2019-03-07 DIAGNOSIS — Z923 Personal history of irradiation: Secondary | ICD-10-CM | POA: Insufficient documentation

## 2019-03-07 DIAGNOSIS — R630 Anorexia: Secondary | ICD-10-CM | POA: Insufficient documentation

## 2019-03-07 DIAGNOSIS — C3431 Malignant neoplasm of lower lobe, right bronchus or lung: Secondary | ICD-10-CM

## 2019-03-07 DIAGNOSIS — Z5112 Encounter for antineoplastic immunotherapy: Secondary | ICD-10-CM | POA: Insufficient documentation

## 2019-03-07 DIAGNOSIS — J439 Emphysema, unspecified: Secondary | ICD-10-CM | POA: Insufficient documentation

## 2019-03-07 DIAGNOSIS — J431 Panlobular emphysema: Secondary | ICD-10-CM

## 2019-03-07 DIAGNOSIS — I255 Ischemic cardiomyopathy: Secondary | ICD-10-CM | POA: Diagnosis not present

## 2019-03-07 DIAGNOSIS — Z5111 Encounter for antineoplastic chemotherapy: Secondary | ICD-10-CM | POA: Insufficient documentation

## 2019-03-07 LAB — CBC WITH DIFFERENTIAL (CANCER CENTER ONLY)
Abs Immature Granulocytes: 0.02 10*3/uL (ref 0.00–0.07)
Basophils Absolute: 0 10*3/uL (ref 0.0–0.1)
Basophils Relative: 0 %
Eosinophils Absolute: 0.2 10*3/uL (ref 0.0–0.5)
Eosinophils Relative: 4 %
HCT: 37.5 % — ABNORMAL LOW (ref 39.0–52.0)
Hemoglobin: 12.5 g/dL — ABNORMAL LOW (ref 13.0–17.0)
Immature Granulocytes: 0 %
Lymphocytes Relative: 21 %
Lymphs Abs: 1.2 10*3/uL (ref 0.7–4.0)
MCH: 27.9 pg (ref 26.0–34.0)
MCHC: 33.3 g/dL (ref 30.0–36.0)
MCV: 83.7 fL (ref 80.0–100.0)
Monocytes Absolute: 0.6 10*3/uL (ref 0.1–1.0)
Monocytes Relative: 10 %
Neutro Abs: 3.5 10*3/uL (ref 1.7–7.7)
Neutrophils Relative %: 65 %
Platelet Count: 364 10*3/uL (ref 150–400)
RBC: 4.48 MIL/uL (ref 4.22–5.81)
RDW: 15.3 % (ref 11.5–15.5)
WBC Count: 5.5 10*3/uL (ref 4.0–10.5)
nRBC: 0 % (ref 0.0–0.2)

## 2019-03-07 LAB — CMP (CANCER CENTER ONLY)
ALT: 7 U/L (ref 0–44)
AST: 10 U/L — ABNORMAL LOW (ref 15–41)
Albumin: 3.5 g/dL (ref 3.5–5.0)
Alkaline Phosphatase: 54 U/L (ref 38–126)
Anion gap: 6 (ref 5–15)
BUN: 10 mg/dL (ref 6–20)
CO2: 25 mmol/L (ref 22–32)
Calcium: 9.7 mg/dL (ref 8.9–10.3)
Chloride: 105 mmol/L (ref 98–111)
Creatinine: 0.95 mg/dL (ref 0.61–1.24)
GFR, Est AFR Am: >60 mL/min (ref >60)
GFR, Estimated: >60 mL/min (ref >60)
Glucose, Bld: 121 mg/dL — ABNORMAL HIGH (ref 70–99)
Potassium: 4.3 mmol/L (ref 3.5–5.1)
Sodium: 136 mmol/L (ref 135–145)
Total Bilirubin: 0.4 mg/dL (ref 0.3–1.2)
Total Protein: 8.5 g/dL — ABNORMAL HIGH (ref 6.5–8.1)

## 2019-03-07 MED ORDER — PROCHLORPERAZINE MALEATE 10 MG PO TABS: 10.0000 mg | ORAL_TABLET | Freq: Four times a day (QID) | ORAL | 0 refills | Status: DC | PRN

## 2019-03-07 MED ORDER — CYANOCOBALAMIN 1000 MCG/ML IJ SOLN
INTRAMUSCULAR | Status: AC
  Filled 2019-03-07: qty 1

## 2019-03-07 MED ORDER — CYANOCOBALAMIN 1000 MCG/ML IJ SOLN
1000.0000 ug | Freq: Once | INTRAMUSCULAR | Status: AC
  Administered 2019-03-07: 1000 ug via INTRAMUSCULAR

## 2019-03-07 MED ORDER — FOLIC ACID 1 MG PO TABS: 1.0000 mg | ORAL_TABLET | Freq: Every day | ORAL | 4 refills | Status: DC
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
## 2019-03-07 NOTE — Progress Notes (Signed)
START ON PATHWAY REGIMEN - Non-Small Cell Lung     A cycle is every 21 days:     Pembrolizumab      Pemetrexed      Carboplatin   **Always confirm dose/schedule in your pharmacy ordering system**  Patient Characteristics: Stage IV Metastatic, Nonsquamous, Initial Chemotherapy/Immunotherapy, PS = 0, 1, ALK Rearrangement Negative and EGFR Mutation Negative/Non-Sensitizing, PD-L1 Expression Positive 1-49% (TPS) / Negative / Not Tested / Awaiting Test Results and  Immunotherapy Candidate AJCC T Category: T3 Current Disease Status: Distant Metastases AJCC N Category: N1 AJCC M Category: M1b AJCC 8 Stage Grouping: IVA Histology: Nonsquamous Cell ROS1 Rearrangement Status: Negative T790M Mutation Status: Not Applicable - EGFR Mutation Negative/Unknown Other Mutations/Biomarkers: No Other Actionable Mutations Chemotherapy/Immunotherapy LOT: Initial Chemotherapy/Immunotherapy Molecular Targeted Therapy: Not Appropriate MET Exon 14 Mutation Status: Negative RET Gene Fusion Status: Negative EGFR Mutation Status: Negative/Wild Type NTRK Gene Fusion Status: Negative PD-L1 Expression Status: PD-L1 Negative ALK Rearrangement Status: Negative BRAF V600E Mutation Status: Negative ECOG Performance Status: 1 Immunotherapy Candidate Status: Candidate for Immunotherapy Intent of Therapy: Non-Curative / Palliative Intent, Discussed with Patient 

## 2019-03-07 NOTE — Telephone Encounter (Signed)
Scheduled appt per 10/8 los - pt aware of appt date and time

## 2019-03-07 NOTE — Progress Notes (Signed)
Oncology Nurse Navigator Documentation  Oncology Nurse Navigator Flowsheets 03/07/2019  Navigator Location [facility]  Referral Date to RadOnc/MedOnc -  Navigator Encounter Type Clinic/MDC/I spoke with patient today during his visit with Dr. [name].  I help to arrange his Guardant test and appt.  He needs help with transportation. I reached out to Vivia Ewing to help with arranging transportation for him.  I also help to explain next steps with treatment.    Telephone -  Confirmed Diagnosis Date -  Expected Surgery Date -  Treatment Initiated Date -  Patient Visit Type MedOnc  Treatment Phase Pre-Tx/Tx Discussion  Barriers/Navigation Needs Coordination of Engineer, drilling During Treatment;Other  Interventions Coordination of Care;Education;Referrals  Referrals Social Work  English as a second language teacher of Care Other  Education Method Verbal  Support Groups/Services -  Acuity Level 3-Moderate Needs (3-4 Barriers Identified)  Acuity Level 1 -  Acuity Level 2 -  Time Spent with Patient 45  Time Spent with Patient (Retired) -

## 2019-03-08 LAB — SURGICAL PATHOLOGY

## 2019-03-11 ENCOUNTER — Other Ambulatory Visit: Payer: Medicare HMO

## 2019-03-13 ENCOUNTER — Other Ambulatory Visit: Payer: Self-pay

## 2019-03-13 ENCOUNTER — Inpatient Hospital Stay: Payer: Medicare HMO

## 2019-03-13 ENCOUNTER — Inpatient Hospital Stay: Payer: Medicare HMO | Admitting: *Deleted

## 2019-03-13 VITALS — BP 115/77 | HR 63 | Resp 17

## 2019-03-13 DIAGNOSIS — C61 Malignant neoplasm of prostate: Secondary | ICD-10-CM | POA: Diagnosis not present

## 2019-03-13 DIAGNOSIS — C3491 Malignant neoplasm of unspecified part of right bronchus or lung: Secondary | ICD-10-CM

## 2019-03-13 LAB — CBC WITH DIFFERENTIAL (CANCER CENTER ONLY)
Abs Immature Granulocytes: 0.01 10*3/uL (ref 0.00–0.07)
Basophils Absolute: 0 10*3/uL (ref 0.0–0.1)
Basophils Relative: 1 %
Eosinophils Absolute: 0.3 10*3/uL (ref 0.0–0.5)
Eosinophils Relative: 7 %
HCT: 35.1 % — ABNORMAL LOW (ref 39.0–52.0)
Hemoglobin: 11.8 g/dL — ABNORMAL LOW (ref 13.0–17.0)
Immature Granulocytes: 0 %
Lymphocytes Relative: 22 %
Lymphs Abs: 1 10*3/uL (ref 0.7–4.0)
MCH: 28.7 pg (ref 26.0–34.0)
MCHC: 33.6 g/dL (ref 30.0–36.0)
MCV: 85.4 fL (ref 80.0–100.0)
Monocytes Absolute: 0.5 10*3/uL (ref 0.1–1.0)
Monocytes Relative: 11 %
Neutro Abs: 2.7 10*3/uL (ref 1.7–7.7)
Neutrophils Relative %: 59 %
Platelet Count: 339 10*3/uL (ref 150–400)
RBC: 4.11 MIL/uL — ABNORMAL LOW (ref 4.22–5.81)
RDW: 15.2 % (ref 11.5–15.5)
WBC Count: 4.6 10*3/uL (ref 4.0–10.5)
nRBC: 0 % (ref 0.0–0.2)

## 2019-03-13 LAB — CMP (CANCER CENTER ONLY)
ALT: 9 U/L (ref 0–44)
AST: 12 U/L — ABNORMAL LOW (ref 15–41)
Albumin: 3.5 g/dL (ref 3.5–5.0)
Alkaline Phosphatase: 53 U/L (ref 38–126)
Anion gap: 7 (ref 5–15)
BUN: 14 mg/dL (ref 6–20)
CO2: 24 mmol/L (ref 22–32)
Calcium: 9.8 mg/dL (ref 8.9–10.3)
Chloride: 106 mmol/L (ref 98–111)
Creatinine: 0.9 mg/dL (ref 0.61–1.24)
GFR, Est AFR Am: >60 mL/min (ref >60)
GFR, Estimated: >60 mL/min (ref >60)
Glucose, Bld: 106 mg/dL — ABNORMAL HIGH (ref 70–99)
Potassium: 4.9 mmol/L (ref 3.5–5.1)
Sodium: 137 mmol/L (ref 135–145)
Total Bilirubin: 0.2 mg/dL — ABNORMAL LOW (ref 0.3–1.2)
Total Protein: 8.5 g/dL — ABNORMAL HIGH (ref 6.5–8.1)

## 2019-03-13 LAB — TSH: TSH: 1.555 u[IU]/mL (ref 0.320–4.118)

## 2019-03-13 MED ORDER — SODIUM CHLORIDE 0.9 % IV SOLN
451.0000 mg | Freq: Once | INTRAVENOUS | Status: AC
  Administered 2019-03-13: 450 mg via INTRAVENOUS
  Filled 2019-03-13: qty 45

## 2019-03-13 MED ORDER — SODIUM CHLORIDE 0.9 % IV SOLN
200.0000 mg | Freq: Once | INTRAVENOUS | Status: AC
  Administered 2019-03-13: 200 mg via INTRAVENOUS
  Filled 2019-03-13: qty 8

## 2019-03-13 MED ORDER — SODIUM CHLORIDE 0.9 % IV SOLN
500.0000 mg/m2 | Freq: Once | INTRAVENOUS | Status: AC
  Administered 2019-03-13: 800 mg via INTRAVENOUS
  Filled 2019-03-13: qty 12

## 2019-03-13 MED ORDER — SODIUM CHLORIDE 0.9 % IV SOLN
Freq: Once | INTRAVENOUS | Status: AC
  Administered 2019-03-13: 10:00:00 via INTRAVENOUS
  Filled 2019-03-13: qty 5

## 2019-03-13 MED ORDER — SODIUM CHLORIDE 0.9 % IV SOLN
Freq: Once | INTRAVENOUS | Status: AC
  Administered 2019-03-13: 10:00:00 via INTRAVENOUS
  Filled 2019-03-13: qty 250

## 2019-03-13 MED ORDER — PALONOSETRON HCL INJECTION 0.25 MG/5ML
0.2500 mg | Freq: Once | INTRAVENOUS | Status: AC
  Administered 2019-03-13: 0.25 mg via INTRAVENOUS

## 2019-03-13 MED ORDER — PALONOSETRON HCL INJECTION 0.25 MG/5ML
INTRAVENOUS | Status: AC
  Filled 2019-03-13: qty 5

## 2019-03-13 NOTE — Progress Notes (Signed)
[name]  Clinical Social Norman was referred by medical oncology and chemo education for assessment of psychosocial needs.  Clinical Social Worker met with patient in infusion room to offer support and assess for needs.    [name] reported he receives public housing assistance in [place], [place].  He has attempted to receive housing in [place] and [place] areas and has been on the wait list for years.  Patient has family in [place] area, but states none that are able to provide support such as transportation to appointments.  [name] friend from [place] has been driving up to [place] and bringing him to appointments. CSW has inquired whether patient may be eligible for [facility] transportation program or [employer] benefits.  CSW contacted [employer] on [address], they stated their wait list is very long and patient cannot be expedited due to medical situation.  CSW explored patient's sense of understanding surrounding diagnosis and current emotional responses.  Patient became tearful, but guarded when discussing cancer diagnosis.  CSW will follow up with patient to build rapport and provide emotional support. CSW provided patient [employer] card for gas/food.  Gwinda Maine, LCSW  Clinical Social Worker [facility]

## 2019-03-13 NOTE — Patient Instructions (Addendum)
**Please avoid taking Ibuprofen/Advil while receiving treatment. If you're having pain and need to take something, please take Tylenol/Acetamenophen**  [facility] Discharge Instructions for Patients Receiving Chemotherapy  Today you received the following chemotherapy agents: Pembrolizumab (Keytruda), Pemetrexed (Alimta), and Carboplatin (Paraplatin)  To help prevent nausea and vomiting after your treatment, we encourage you to take your nausea medication as directed.   If you develop nausea and vomiting that is not controlled by your nausea medication, call the clinic.   BELOW ARE SYMPTOMS THAT SHOULD BE REPORTED IMMEDIATELY:  *FEVER GREATER THAN 100.5 F  *CHILLS WITH OR WITHOUT FEVER  NAUSEA AND VOMITING THAT IS NOT CONTROLLED WITH YOUR NAUSEA MEDICATION  *UNUSUAL SHORTNESS OF BREATH  *UNUSUAL BRUISING OR BLEEDING  TENDERNESS IN MOUTH AND THROAT WITH OR WITHOUT PRESENCE OF ULCERS  *URINARY PROBLEMS  *BOWEL PROBLEMS  UNUSUAL RASH Items with * indicate a potential emergency and should be followed up as soon as possible.  Feel free to call the clinic should you have any questions or concerns. The clinic phone number is [phone].  Please show the [facility] at check-in to the Emergency Department and triage nurse.  Pembrolizumab injection What is this medicine? PEMBROLIZUMAB (pem broe liz ue mab) is a monoclonal antibody. It is used to treat bladder cancer, cervical cancer, endometrial cancer, esophageal cancer, head and neck cancer, hepatocellular cancer, Hodgkin lymphoma, kidney cancer, lymphoma, melanoma, Merkel cell carcinoma, lung cancer, stomach cancer, urothelial cancer, and cancers that have a certain genetic condition. This medicine may be used for other purposes; ask your health care provider or pharmacist if you have questions. COMMON BRAND NAME(S): Keytruda What should I tell my health care provider before I take this medicine? They need  to know if you have any of these conditions:  diabetes  immune system problems  inflammatory bowel disease  liver disease  lung or breathing disease  lupus  received or scheduled to receive an organ transplant or a stem-cell transplant that uses donor stem cells  an unusual or allergic reaction to pembrolizumab, other medicines, foods, dyes, or preservatives  pregnant or trying to get pregnant  breast-feeding How should I use this medicine? This medicine is for infusion into a vein. It is given by a health care professional in a hospital or clinic setting. A special MedGuide will be given to you before each treatment. Be sure to read this information carefully each time. Talk to your pediatrician regarding the use of this medicine in children. While this drug may be prescribed for selected conditions, precautions do apply. Overdosage: If you think you have taken too much of this medicine contact a poison control center or emergency room at once. NOTE: This medicine is only for you. Do not share this medicine with others. What if I miss a dose? It is important not to miss your dose. Call your doctor or health care professional if you are unable to keep an appointment. What may interact with this medicine? Interactions have not been studied. Give your health care provider a list of all the medicines, herbs, non-prescription drugs, or dietary supplements you use. Also tell them if you smoke, drink alcohol, or use illegal drugs. Some items may interact with your medicine. This list may not describe all possible interactions. Give your health care provider a list of all the medicines, herbs, non-prescription drugs, or dietary supplements you use. Also tell them if you smoke, drink alcohol, or use illegal drugs. Some items may interact with  your medicine. What should I watch for while using this medicine? Your condition will be monitored carefully while you are receiving this  medicine. You may need blood work done while you are taking this medicine. Do not become pregnant while taking this medicine or for 4 months after stopping it. Women should inform their doctor if they wish to become pregnant or think they might be pregnant. There is a potential for serious side effects to an unborn child. Talk to your health care professional or pharmacist for more information. Do not breast-feed an infant while taking this medicine or for 4 months after the last dose. What side effects may I notice from receiving this medicine? Side effects that you should report to your doctor or health care professional as soon as possible:  allergic reactions like skin rash, itching or hives, swelling of the face, lips, or tongue  bloody or black, tarry  breathing problems  changes in vision  chest pain  chills  confusion  constipation  cough  diarrhea  dizziness or feeling faint or lightheaded  fast or irregular heartbeat  fever  flushing  hair loss  joint pain  low blood counts - this medicine may decrease the number of [place] blood cells, red blood cells and platelets. You may be at increased risk for infections and bleeding.  muscle pain  muscle weakness  persistent headache  redness, blistering, peeling or loosening of the skin, including inside the mouth  signs and symptoms of high blood sugar such as dizziness; dry mouth; dry skin; fruity breath; nausea; stomach pain; increased hunger or thirst; increased urination  signs and symptoms of kidney injury like trouble passing urine or change in the amount of urine  signs and symptoms of liver injury like dark urine, light-colored stools, loss of appetite, nausea, right upper belly pain, yellowing of the eyes or skin  sweating  swollen lymph nodes  weight loss Side effects that usually do not require medical attention (report to your doctor or health care professional if they continue or are  bothersome):  decreased appetite  muscle pain  tiredness This list may not describe all possible side effects. Call your doctor for medical advice about side effects. You may report side effects to FDA at 1-800-FDA-1088. Where should I keep my medicine? This drug is given in a hospital or clinic and will not be stored at home. NOTE: This sheet is a summary. It may not cover all possible information. If you have questions about this medicine, talk to your doctor, pharmacist, or health care provider.  2020 Elsevier/Gold Standard (2018-06-12 13:46:58)  Pemetrexed injection What is this medicine? PEMETREXED (PEM e TREX ed) is a chemotherapy drug used to treat lung cancers like non-small cell lung cancer and mesothelioma. It may also be used to treat other cancers. This medicine may be used for other purposes; ask your health care provider or pharmacist if you have questions. COMMON BRAND NAME(S): Alimta What should I tell my health care provider before I take this medicine? They need to know if you have any of these conditions:  infection (especially a virus infection such as chickenpox, cold sores, or herpes)  kidney disease  low blood counts, like low Shuey cell, platelet, or red cell counts  lung or breathing disease, like asthma  radiation therapy  an unusual or allergic reaction to pemetrexed, other medicines, foods, dyes, or preservative  pregnant or trying to get pregnant  breast-feeding How should I use this medicine? This drug is  given as an infusion into a vein. It is administered in a hospital or clinic by a specially trained health care professional. Talk to your pediatrician regarding the use of this medicine in children. Special care may be needed. Overdosage: If you think you have taken too much of this medicine contact a poison control center or emergency room at once. NOTE: This medicine is only for you. Do not share this medicine with others. What if I miss a  dose? It is important not to miss your dose. Call your doctor or health care professional if you are unable to keep an appointment. What may interact with this medicine? This medicine may interact with the following medications:  Ibuprofen This list may not describe all possible interactions. Give your health care provider a list of all the medicines, herbs, non-prescription drugs, or dietary supplements you use. Also tell them if you smoke, drink alcohol, or use illegal drugs. Some items may interact with your medicine. What should I watch for while using this medicine? Visit your doctor for checks on your progress. This drug may make you feel generally unwell. This is not uncommon, as chemotherapy can affect healthy cells as well as cancer cells. Report any side effects. Continue your course of treatment even though you feel ill unless your doctor tells you to stop. In some cases, you may be given additional medicines to help with side effects. Follow all directions for their use. Call your doctor or health care professional for advice if you get a fever, chills or sore throat, or other symptoms of a cold or flu. Do not treat yourself. This drug decreases your body's ability to fight infections. Try to avoid being around people who are sick. This medicine may increase your risk to bruise or bleed. Call your doctor or health care professional if you notice any unusual bleeding. Be careful brushing and flossing your teeth or using a toothpick because you may get an infection or bleed more easily. If you have any dental work done, tell your dentist you are receiving this medicine. Avoid taking products that contain aspirin, acetaminophen, ibuprofen, naproxen, or ketoprofen unless instructed by your doctor. These medicines may hide a fever. Call your doctor or health care professional if you get diarrhea or mouth sores. Do not treat yourself. To protect your kidneys, drink water or other fluids as  directed while you are taking this medicine. Do not become pregnant while taking this medicine or for 6 months after stopping it. Women should inform their doctor if they wish to become pregnant or think they might be pregnant. Men should not father a child while taking this medicine and for 3 months after stopping it. This may interfere with the ability to father a child. You should talk to your doctor or health care professional if you are concerned about your fertility. There is a potential for serious side effects to an unborn child. Talk to your health care professional or pharmacist for more information. Do not breast-feed an infant while taking this medicine or for 1 week after stopping it. What side effects may I notice from receiving this medicine? Side effects that you should report to your doctor or health care professional as soon as possible:  allergic reactions like skin rash, itching or hives, swelling of the face, lips, or tongue  breathing problems  redness, blistering, peeling or loosening of the skin, including inside the mouth  signs and symptoms of bleeding such as bloody or black, tarry stools;  red or dark-brown urine; spitting up blood or brown material that looks like coffee grounds; red spots on the skin; unusual bruising or bleeding from the eye, gums, or nose  signs and symptoms of infection like fever or chills; cough; sore throat; pain or trouble passing urine  signs and symptoms of kidney injury like trouble passing urine or change in the amount of urine  signs and symptoms of liver injury like dark yellow or brown urine; general ill feeling or flu-like symptoms; light-colored stools; loss of appetite; nausea; right upper belly pain; unusually weak or tired; yellowing of the eyes or skin Side effects that usually do not require medical attention (report to your doctor or health care professional if they continue or are bothersome):  constipation  mouth  sores  nausea, vomiting  unusually weak or tired This list may not describe all possible side effects. Call your doctor for medical advice about side effects. You may report side effects to FDA at 1-800-FDA-1088. Where should I keep my medicine? This drug is given in a hospital or clinic and will not be stored at home. NOTE: This sheet is a summary. It may not cover all possible information. If you have questions about this medicine, talk to your doctor, pharmacist, or health care provider.  2020 Elsevier/Gold Standard (2017-07-05 16:11:33)  Carboplatin injection What is this medicine? CARBOPLATIN (KAR boe pla tin) is a chemotherapy drug. It targets fast dividing cells, like cancer cells, and causes these cells to die. This medicine is used to treat ovarian cancer and many other cancers. This medicine may be used for other purposes; ask your health care provider or pharmacist if you have questions. COMMON BRAND NAME(S): Paraplatin What should I tell my health care provider before I take this medicine? They need to know if you have any of these conditions:  blood disorders  hearing problems  kidney disease  recent or ongoing radiation therapy  an unusual or allergic reaction to carboplatin, cisplatin, other chemotherapy, other medicines, foods, dyes, or preservatives  pregnant or trying to get pregnant  breast-feeding How should I use this medicine? This drug is usually given as an infusion into a vein. It is administered in a hospital or clinic by a specially trained health care professional. Talk to your pediatrician regarding the use of this medicine in children. Special care may be needed. Overdosage: If you think you have taken too much of this medicine contact a poison control center or emergency room at once. NOTE: This medicine is only for you. Do not share this medicine with others. What if I miss a dose? It is important not to miss a dose. Call your doctor or health  care professional if you are unable to keep an appointment. What may interact with this medicine?  medicines for seizures  medicines to increase blood counts like filgrastim, pegfilgrastim, sargramostim  some antibiotics like amikacin, gentamicin, neomycin, streptomycin, tobramycin  vaccines Talk to your doctor or health care professional before taking any of these medicines:  acetaminophen  aspirin  ibuprofen  ketoprofen  naproxen This list may not describe all possible interactions. Give your health care provider a list of all the medicines, herbs, non-prescription drugs, or dietary supplements you use. Also tell them if you smoke, drink alcohol, or use illegal drugs. Some items may interact with your medicine. What should I watch for while using this medicine? Your condition will be monitored carefully while you are receiving this medicine. You will need important blood work done  while you are taking this medicine. This drug may make you feel generally unwell. This is not uncommon, as chemotherapy can affect healthy cells as well as cancer cells. Report any side effects. Continue your course of treatment even though you feel ill unless your doctor tells you to stop. In some cases, you may be given additional medicines to help with side effects. Follow all directions for their use. Call your doctor or health care professional for advice if you get a fever, chills or sore throat, or other symptoms of a cold or flu. Do not treat yourself. This drug decreases your body's ability to fight infections. Try to avoid being around people who are sick. This medicine may increase your risk to bruise or bleed. Call your doctor or health care professional if you notice any unusual bleeding. Be careful brushing and flossing your teeth or using a toothpick because you may get an infection or bleed more easily. If you have any dental work done, tell your dentist you are receiving this medicine. Avoid  taking products that contain aspirin, acetaminophen, ibuprofen, naproxen, or ketoprofen unless instructed by your doctor. These medicines may hide a fever. Do not become pregnant while taking this medicine. Women should inform their doctor if they wish to become pregnant or think they might be pregnant. There is a potential for serious side effects to an unborn child. Talk to your health care professional or pharmacist for more information. Do not breast-feed an infant while taking this medicine. What side effects may I notice from receiving this medicine? Side effects that you should report to your doctor or health care professional as soon as possible:  allergic reactions like skin rash, itching or hives, swelling of the face, lips, or tongue  signs of infection - fever or chills, cough, sore throat, pain or difficulty passing urine  signs of decreased platelets or bleeding - bruising, pinpoint red spots on the skin, black, tarry stools, nosebleeds  signs of decreased red blood cells - unusually weak or tired, fainting spells, lightheadedness  breathing problems  changes in hearing  changes in vision  chest pain  high blood pressure  low blood counts - This drug may decrease the number of [place] blood cells, red blood cells and platelets. You may be at increased risk for infections and bleeding.  nausea and vomiting  pain, swelling, redness or irritation at the injection site  pain, tingling, numbness in the hands or feet  problems with balance, talking, walking  trouble passing urine or change in the amount of urine Side effects that usually do not require medical attention (report to your doctor or health care professional if they continue or are bothersome):  hair loss  loss of appetite  metallic taste in the mouth or changes in taste This list may not describe all possible side effects. Call your doctor for medical advice about side effects. You may report side effects  to FDA at 1-800-FDA-1088. Where should I keep my medicine? This drug is given in a hospital or clinic and will not be stored at home. NOTE: This sheet is a summary. It may not cover all possible information. If you have questions about this medicine, talk to your doctor, pharmacist, or health care provider.  2020 Elsevier/Gold Standard (2007-08-21 14:38:05)  Coronavirus (COVID-19) Are you at risk?  Are you at risk for the Coronavirus (COVID-19)?  To be considered HIGH RISK for Coronavirus (COVID-19), you have to meet the following criteria:  . Traveled to [place],  [place], [place], [place] or [place]; or in the [place] to [place], [place], [place], or [place]; and have fever, cough, and shortness of breath within the last 2 weeks of travel OR . Been in close contact with a person diagnosed with COVID-19 within the last 2 weeks and have fever, cough, and shortness of breath . IF YOU DO NOT MEET THESE CRITERIA, YOU ARE CONSIDERED LOW RISK FOR COVID-19.  What to do if you are HIGH RISK for COVID-19?  Marland Kitchen If you are having a medical emergency, call 911. . Seek medical care right away. Before you go to a doctor's office, urgent care or emergency department, call ahead and tell them about your recent travel, contact with someone diagnosed with COVID-19, and your symptoms. You should receive instructions from your physician's office regarding next steps of care.  . When you arrive at healthcare provider, tell the healthcare staff immediately you have returned from visiting [place], [place], [place], [place] or [place]; or traveled in the [place] to [place], [place], [place], or [place]; in the last two weeks or you have been in close contact with a person diagnosed with COVID-19 in the last 2 weeks.   . Tell the health care staff about your symptoms: fever, cough and shortness of breath. . After you have been seen by a medical provider, you will be either: o Tested for  (COVID-19) and discharged home on quarantine except to seek medical care if symptoms worsen, and asked to  - Stay home and avoid contact with others until you get your results (4-5 days)  - Avoid travel on public transportation if possible (such as bus, train, or airplane) or o Sent to the Emergency Department by EMS for evaluation, COVID-19 testing, and possible admission depending on your condition and test results.  What to do if you are LOW RISK for COVID-19?  Reduce your risk of any infection by using the same precautions used for avoiding the common cold or flu:  Marland Kitchen Wash your hands often with soap and warm water for at least 20 seconds.  If soap and water are not readily available, use an alcohol-based hand sanitizer with at least 60% alcohol.  . If coughing or sneezing, cover your mouth and nose by coughing or sneezing into the elbow areas of your shirt or coat, into a tissue or into your sleeve (not your hands). . Avoid shaking hands with others and consider head nods or verbal greetings only. . Avoid touching your eyes, nose, or mouth with unwashed hands.  . Avoid close contact with people who are sick. . Avoid places or events with large numbers of people in one location, like concerts or sporting events. . Carefully consider travel plans you have or are making. . If you are planning any travel outside or inside the [place], visit the CDC's Travelers' Health webpage for the latest health notices. . If you have some symptoms but not all symptoms, continue to monitor at home and seek medical attention if your symptoms worsen. . If you are having a medical emergency, call 911.   [place] / e-Visit: [email or web address]         [facility]: [facility]: [phone]                   [facility]: [phone]

## 2019-03-14 ENCOUNTER — Telehealth: Payer: Self-pay | Admitting: *Deleted

## 2019-03-14 NOTE — Telephone Encounter (Signed)
Called [facility] for chemotherapy F/U.  Patient is doing well.  Currently in [place] denies demographic changes needed.   Denies n/v, changes, new side effects or symptoms.  Bowel and bladder functioning well.  LBM last night.  Eating and drinking well.  Instructed to drink 64 oz minimum daily or at least the day before, of and after treatment.   Currently denies questions or needs.  Encouraged to call [phone] Mon -Fri 8:00 am - 4:30 pm or anytime as needed for symptoms, changes or event outside office hours.

## 2019-03-18 ENCOUNTER — Telehealth: Payer: Self-pay | Admitting: *Deleted

## 2019-03-18 NOTE — Telephone Encounter (Signed)
Received vm message from patient requesting a  Call back regarding his appt on 03/20/19. Attempted call back. No answer.  Was able to leave vm message on pt's phone to call back before 4pm or call tomorrow morning

## 2019-03-19 ENCOUNTER — Encounter: Payer: Self-pay | Admitting: Internal Medicine

## 2019-03-19 NOTE — Progress Notes (Signed)
Called pt to introduce myself as his Arboriculturist.  Unfortunately there aren't any foundations offering copay assistance for his Dx and the type of ins he has.  I offered the [place], went over what it covers and gave him the income requirement.  He would like to apply so he will bring his proof of income on 03/21/19.  If approved I will give him an expense sheet and my card for any questions or concerns he may have in the future.

## 2019-03-20 ENCOUNTER — Inpatient Hospital Stay: Payer: Medicare HMO | Admitting: Physician Assistant

## 2019-03-20 ENCOUNTER — Inpatient Hospital Stay: Payer: Medicare HMO | Admitting: *Deleted

## 2019-03-20 ENCOUNTER — Inpatient Hospital Stay: Payer: Medicare HMO

## 2019-03-20 DIAGNOSIS — C3491 Malignant neoplasm of unspecified part of right bronchus or lung: Secondary | ICD-10-CM

## 2019-03-20 NOTE — Progress Notes (Signed)
[place] Work  Clinical Social Work contacted patient by phone to follow up from previous visit.  Mr. Repass shared he is experiencing some headaches and nausea but overall feeling well physically.  He reported he's come to accept his diagnosis- CSW and patient discussed how to focus on coping day to day and finding gratitude in the moment. Mr. Schaab continues to rely on his friend to transport to [facility] appointments.  Patient does not qualify for [facility] transportation program due to living 50+ miles from the center.  CSW notified patient of lack of resources and he verbalized understanding.  CSW can provide [employer] gift cards to assist with paying for gas.  Patient plans to follow up with patient as needed.  Gwinda Maine, LCSW  Clinical Social Worker [facility]

## 2019-03-21 ENCOUNTER — Telehealth: Payer: Self-pay | Admitting: Physician Assistant

## 2019-03-21 ENCOUNTER — Inpatient Hospital Stay: Payer: Medicare HMO

## 2019-03-21 ENCOUNTER — Inpatient Hospital Stay (HOSPITAL_BASED_OUTPATIENT_CLINIC_OR_DEPARTMENT_OTHER): Payer: Medicare HMO | Admitting: Physician Assistant

## 2019-03-21 ENCOUNTER — Other Ambulatory Visit: Payer: Self-pay

## 2019-03-21 ENCOUNTER — Encounter: Payer: Self-pay | Admitting: Internal Medicine

## 2019-03-21 VITALS — BP 100/65 | HR 62 | Temp 98.7°F | Resp 17 | Ht 68.0 in | Wt 118.0 lb

## 2019-03-21 DIAGNOSIS — C3491 Malignant neoplasm of unspecified part of right bronchus or lung: Secondary | ICD-10-CM

## 2019-03-21 DIAGNOSIS — C61 Malignant neoplasm of prostate: Secondary | ICD-10-CM | POA: Diagnosis not present

## 2019-03-21 LAB — CMP (CANCER CENTER ONLY)
ALT: 15 U/L (ref 0–44)
AST: 15 U/L (ref 15–41)
Albumin: 3.3 g/dL — ABNORMAL LOW (ref 3.5–5.0)
Alkaline Phosphatase: 53 U/L (ref 38–126)
Anion gap: 11 (ref 5–15)
BUN: 20 mg/dL (ref 6–20)
CO2: 26 mmol/L (ref 22–32)
Calcium: 9.2 mg/dL (ref 8.9–10.3)
Chloride: 99 mmol/L (ref 98–111)
Creatinine: 1.1 mg/dL (ref 0.61–1.24)
GFR, Est AFR Am: >60 mL/min (ref >60)
GFR, Estimated: >60 mL/min (ref >60)
Glucose, Bld: 79 mg/dL (ref 70–99)
Potassium: 4 mmol/L (ref 3.5–5.1)
Sodium: 136 mmol/L (ref 135–145)
Total Bilirubin: 0.2 mg/dL — ABNORMAL LOW (ref 0.3–1.2)
Total Protein: 8.1 g/dL (ref 6.5–8.1)

## 2019-03-21 LAB — CBC WITH DIFFERENTIAL (CANCER CENTER ONLY)
Abs Immature Granulocytes: 0.01 10*3/uL (ref 0.00–0.07)
Basophils Absolute: 0 10*3/uL (ref 0.0–0.1)
Basophils Relative: 0 %
Eosinophils Absolute: 0.2 10*3/uL (ref 0.0–0.5)
Eosinophils Relative: 5 %
HCT: 29.3 % — ABNORMAL LOW (ref 39.0–52.0)
Hemoglobin: 10.1 g/dL — ABNORMAL LOW (ref 13.0–17.0)
Immature Granulocytes: 0 %
Lymphocytes Relative: 43 %
Lymphs Abs: 1.2 10*3/uL (ref 0.7–4.0)
MCH: 29.2 pg (ref 26.0–34.0)
MCHC: 34.5 g/dL (ref 30.0–36.0)
MCV: 84.7 fL (ref 80.0–100.0)
Monocytes Absolute: 0.3 10*3/uL (ref 0.1–1.0)
Monocytes Relative: 10 %
Neutro Abs: 1.2 10*3/uL — ABNORMAL LOW (ref 1.7–7.7)
Neutrophils Relative %: 42 %
Platelet Count: 223 10*3/uL (ref 150–400)
RBC: 3.46 MIL/uL — ABNORMAL LOW (ref 4.22–5.81)
RDW: 14.3 % (ref 11.5–15.5)
WBC Count: 2.9 10*3/uL — ABNORMAL LOW (ref 4.0–10.5)
nRBC: 0 % (ref 0.0–0.2)

## 2019-03-21 NOTE — Telephone Encounter (Signed)
Per 10/22 los Keep appointments as scheduled

## 2019-03-21 NOTE — Progress Notes (Signed)
Pt is approved for the $700 [facility] grant.  °
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
## 2019-03-22 LAB — GUARDANT 360

## 2019-03-25 ENCOUNTER — Telehealth: Payer: Self-pay | Admitting: Internal Medicine

## 2019-03-25 NOTE — Telephone Encounter (Signed)
Returned patient's phone call regarding rescheduling 10/28 appointment, due to patient being out of town appointment has been rescheduled to 10/29.

## 2019-03-27 ENCOUNTER — Inpatient Hospital Stay: Payer: Medicare HMO

## 2019-03-28 ENCOUNTER — Telehealth: Payer: Self-pay | Admitting: Internal Medicine

## 2019-03-28 ENCOUNTER — Inpatient Hospital Stay: Payer: Medicare HMO

## 2019-03-28 NOTE — Telephone Encounter (Signed)
Returned patient's phone call regarding rescheduling 10/29 missed appointment, per patient's request appointment moved to 11/02.

## 2019-04-01 ENCOUNTER — Other Ambulatory Visit: Payer: Self-pay

## 2019-04-01 ENCOUNTER — Inpatient Hospital Stay: Payer: Medicare HMO | Attending: Physician Assistant

## 2019-04-01 DIAGNOSIS — F329 Major depressive disorder, single episode, unspecified: Secondary | ICD-10-CM | POA: Diagnosis not present

## 2019-04-01 DIAGNOSIS — C61 Malignant neoplasm of prostate: Secondary | ICD-10-CM | POA: Insufficient documentation

## 2019-04-01 DIAGNOSIS — R5383 Other fatigue: Secondary | ICD-10-CM | POA: Insufficient documentation

## 2019-04-01 DIAGNOSIS — Z79899 Other long term (current) drug therapy: Secondary | ICD-10-CM | POA: Diagnosis not present

## 2019-04-01 DIAGNOSIS — I509 Heart failure, unspecified: Secondary | ICD-10-CM | POA: Diagnosis not present

## 2019-04-01 DIAGNOSIS — Z5112 Encounter for antineoplastic immunotherapy: Secondary | ICD-10-CM | POA: Diagnosis present

## 2019-04-01 DIAGNOSIS — I2510 Atherosclerotic heart disease of native coronary artery without angina pectoris: Secondary | ICD-10-CM | POA: Insufficient documentation

## 2019-04-01 DIAGNOSIS — J449 Chronic obstructive pulmonary disease, unspecified: Secondary | ICD-10-CM | POA: Insufficient documentation

## 2019-04-01 DIAGNOSIS — R630 Anorexia: Secondary | ICD-10-CM | POA: Insufficient documentation

## 2019-04-01 DIAGNOSIS — C3431 Malignant neoplasm of lower lobe, right bronchus or lung: Secondary | ICD-10-CM | POA: Diagnosis not present

## 2019-04-01 DIAGNOSIS — I255 Ischemic cardiomyopathy: Secondary | ICD-10-CM | POA: Diagnosis not present

## 2019-04-01 DIAGNOSIS — E785 Hyperlipidemia, unspecified: Secondary | ICD-10-CM | POA: Diagnosis not present

## 2019-04-01 DIAGNOSIS — Z5111 Encounter for antineoplastic chemotherapy: Secondary | ICD-10-CM | POA: Insufficient documentation

## 2019-04-01 DIAGNOSIS — I252 Old myocardial infarction: Secondary | ICD-10-CM | POA: Insufficient documentation

## 2019-04-01 DIAGNOSIS — Z923 Personal history of irradiation: Secondary | ICD-10-CM | POA: Diagnosis not present

## 2019-04-01 DIAGNOSIS — C3491 Malignant neoplasm of unspecified part of right bronchus or lung: Secondary | ICD-10-CM

## 2019-04-01 LAB — CMP (CANCER CENTER ONLY)
ALT: 11 U/L (ref 0–44)
AST: 13 U/L — ABNORMAL LOW (ref 15–41)
Albumin: 3.2 g/dL — ABNORMAL LOW (ref 3.5–5.0)
Alkaline Phosphatase: 56 U/L (ref 38–126)
Anion gap: 10 (ref 5–15)
BUN: 14 mg/dL (ref 6–20)
CO2: 27 mmol/L (ref 22–32)
Calcium: 9.7 mg/dL (ref 8.9–10.3)
Chloride: 100 mmol/L (ref 98–111)
Creatinine: 1.06 mg/dL (ref 0.61–1.24)
GFR, Est AFR Am: >60 mL/min (ref >60)
GFR, Estimated: >60 mL/min (ref >60)
Glucose, Bld: 190 mg/dL — ABNORMAL HIGH (ref 70–99)
Potassium: 3.8 mmol/L (ref 3.5–5.1)
Sodium: 137 mmol/L (ref 135–145)
Total Bilirubin: <0.2 mg/dL — ABNORMAL LOW (ref 0.3–1.2)
Total Protein: 8.4 g/dL — ABNORMAL HIGH (ref 6.5–8.1)

## 2019-04-01 LAB — CBC WITH DIFFERENTIAL (CANCER CENTER ONLY)
Abs Immature Granulocytes: 0.02 10*3/uL (ref 0.00–0.07)
Basophils Absolute: 0 10*3/uL (ref 0.0–0.1)
Basophils Relative: 1 %
Eosinophils Absolute: 0.1 10*3/uL (ref 0.0–0.5)
Eosinophils Relative: 3 %
HCT: 32.3 % — ABNORMAL LOW (ref 39.0–52.0)
Hemoglobin: 10.8 g/dL — ABNORMAL LOW (ref 13.0–17.0)
Immature Granulocytes: 1 %
Lymphocytes Relative: 24 %
Lymphs Abs: 0.9 10*3/uL (ref 0.7–4.0)
MCH: 28.5 pg (ref 26.0–34.0)
MCHC: 33.4 g/dL (ref 30.0–36.0)
MCV: 85.2 fL (ref 80.0–100.0)
Monocytes Absolute: 0.5 10*3/uL (ref 0.1–1.0)
Monocytes Relative: 13 %
Neutro Abs: 2.4 10*3/uL (ref 1.7–7.7)
Neutrophils Relative %: 58 %
Platelet Count: 478 10*3/uL — ABNORMAL HIGH (ref 150–400)
RBC: 3.79 MIL/uL — ABNORMAL LOW (ref 4.22–5.81)
RDW: 14.6 % (ref 11.5–15.5)
WBC Count: 4 10*3/uL (ref 4.0–10.5)
nRBC: 0 % (ref 0.0–0.2)

## 2019-04-03 ENCOUNTER — Inpatient Hospital Stay: Payer: Medicare HMO

## 2019-04-03 ENCOUNTER — Inpatient Hospital Stay (HOSPITAL_BASED_OUTPATIENT_CLINIC_OR_DEPARTMENT_OTHER): Payer: Medicare HMO | Admitting: Internal Medicine

## 2019-04-03 ENCOUNTER — Encounter: Payer: Self-pay | Admitting: Internal Medicine

## 2019-04-03 ENCOUNTER — Other Ambulatory Visit: Payer: Self-pay

## 2019-04-03 VITALS — BP 106/72 | HR 89 | Temp 98.3°F | Resp 18 | Ht 68.0 in | Wt 119.5 lb

## 2019-04-03 DIAGNOSIS — C3491 Malignant neoplasm of unspecified part of right bronchus or lung: Secondary | ICD-10-CM

## 2019-04-03 DIAGNOSIS — Z5111 Encounter for antineoplastic chemotherapy: Secondary | ICD-10-CM | POA: Diagnosis not present

## 2019-04-03 DIAGNOSIS — Z5112 Encounter for antineoplastic immunotherapy: Secondary | ICD-10-CM

## 2019-04-03 LAB — CBC WITH DIFFERENTIAL (CANCER CENTER ONLY)
Abs Immature Granulocytes: 0.02 10*3/uL (ref 0.00–0.07)
Basophils Absolute: 0 10*3/uL (ref 0.0–0.1)
Basophils Relative: 0 %
Eosinophils Absolute: 0.1 10*3/uL (ref 0.0–0.5)
Eosinophils Relative: 2 %
HCT: 30.4 % — ABNORMAL LOW (ref 39.0–52.0)
Hemoglobin: 10.4 g/dL — ABNORMAL LOW (ref 13.0–17.0)
Immature Granulocytes: 0 %
Lymphocytes Relative: 16 %
Lymphs Abs: 0.8 10*3/uL (ref 0.7–4.0)
MCH: 28.7 pg (ref 26.0–34.0)
MCHC: 34.2 g/dL (ref 30.0–36.0)
MCV: 83.7 fL (ref 80.0–100.0)
Monocytes Absolute: 0.8 10*3/uL (ref 0.1–1.0)
Monocytes Relative: 16 %
Neutro Abs: 3.1 10*3/uL (ref 1.7–7.7)
Neutrophils Relative %: 66 %
Platelet Count: 446 10*3/uL — ABNORMAL HIGH (ref 150–400)
RBC: 3.63 MIL/uL — ABNORMAL LOW (ref 4.22–5.81)
RDW: 14.5 % (ref 11.5–15.5)
WBC Count: 4.8 10*3/uL (ref 4.0–10.5)
nRBC: 0 % (ref 0.0–0.2)

## 2019-04-03 LAB — CMP (CANCER CENTER ONLY)
ALT: 10 U/L (ref 0–44)
AST: 13 U/L — ABNORMAL LOW (ref 15–41)
Albumin: 3.4 g/dL — ABNORMAL LOW (ref 3.5–5.0)
Alkaline Phosphatase: 56 U/L (ref 38–126)
Anion gap: 10 (ref 5–15)
BUN: 17 mg/dL (ref 6–20)
CO2: 23 mmol/L (ref 22–32)
Calcium: 9.9 mg/dL (ref 8.9–10.3)
Chloride: 101 mmol/L (ref 98–111)
Creatinine: 1.05 mg/dL (ref 0.61–1.24)
GFR, Est AFR Am: >60 mL/min (ref >60)
GFR, Estimated: >60 mL/min (ref >60)
Glucose, Bld: 104 mg/dL — ABNORMAL HIGH (ref 70–99)
Potassium: 4.5 mmol/L (ref 3.5–5.1)
Sodium: 134 mmol/L — ABNORMAL LOW (ref 135–145)
Total Bilirubin: <0.2 mg/dL — ABNORMAL LOW (ref 0.3–1.2)
Total Protein: 8.6 g/dL — ABNORMAL HIGH (ref 6.5–8.1)

## 2019-04-03 LAB — TSH: TSH: 0.946 u[IU]/mL (ref 0.320–4.118)

## 2019-04-03 MED ORDER — SODIUM CHLORIDE 0.9 % IV SOLN
Freq: Once | INTRAVENOUS | Status: AC
  Administered 2019-04-03: 13:00:00 via INTRAVENOUS
  Filled 2019-04-03: qty 250

## 2019-04-03 MED ORDER — MIRTAZAPINE 15 MG PO TABS: 15.0000 mg | ORAL_TABLET | Freq: Every day | ORAL | 2 refills | Status: DC

## 2019-04-03 MED ORDER — SODIUM CHLORIDE 0.9 % IV SOLN
Freq: Once | INTRAVENOUS | Status: AC
  Administered 2019-04-03: 13:00:00 via INTRAVENOUS
  Filled 2019-04-03: qty 5

## 2019-04-03 MED ORDER — SODIUM CHLORIDE 0.9 % IV SOLN
200.0000 mg | Freq: Once | INTRAVENOUS | Status: AC
  Administered 2019-04-03: 200 mg via INTRAVENOUS
  Filled 2019-04-03: qty 8

## 2019-04-03 MED ORDER — PALONOSETRON HCL INJECTION 0.25 MG/5ML
INTRAVENOUS | Status: AC
  Filled 2019-04-03: qty 5

## 2019-04-03 MED ORDER — SODIUM CHLORIDE 0.9 % IV SOLN
500.0000 mg/m2 | Freq: Once | INTRAVENOUS | Status: AC
  Administered 2019-04-03: 800 mg via INTRAVENOUS
  Filled 2019-04-03: qty 20

## 2019-04-03 MED ORDER — PALONOSETRON HCL INJECTION 0.25 MG/5ML
0.2500 mg | Freq: Once | INTRAVENOUS | Status: AC
  Administered 2019-04-03: 0.25 mg via INTRAVENOUS

## 2019-04-03 MED ORDER — SODIUM CHLORIDE 0.9 % IV SOLN
417.0000 mg | Freq: Once | INTRAVENOUS | Status: AC
  Administered 2019-04-03: 420 mg via INTRAVENOUS
  Filled 2019-04-03: qty 42

## 2019-04-03 NOTE — Patient Instructions (Signed)
[facility] Discharge Instructions for Patients Receiving Chemotherapy  Today you received the following chemotherapy agents Keytruda, Alimta, and Carboplatin  To help prevent nausea and vomiting after your treatment, we encourage you to take your nausea medication as directed.  If you develop nausea and vomiting that is not controlled by your nausea medication, call the clinic.   BELOW ARE SYMPTOMS THAT SHOULD BE REPORTED IMMEDIATELY:  *FEVER GREATER THAN 100.5 F  *CHILLS WITH OR WITHOUT FEVER  NAUSEA AND VOMITING THAT IS NOT CONTROLLED WITH YOUR NAUSEA MEDICATION  *UNUSUAL SHORTNESS OF BREATH  *UNUSUAL BRUISING OR BLEEDING  TENDERNESS IN MOUTH AND THROAT WITH OR WITHOUT PRESENCE OF ULCERS  *URINARY PROBLEMS  *BOWEL PROBLEMS  UNUSUAL RASH Items with * indicate a potential emergency and should be followed up as soon as possible.  Feel free to call the clinic should you have any questions or concerns. The clinic phone number is [phone].  Please show the CHEMO ALERT CARD at check-in to the Emergency Department and triage nurse.   
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
## 2019-04-04 ENCOUNTER — Telehealth: Payer: Self-pay | Admitting: Internal Medicine

## 2019-04-04 NOTE — Telephone Encounter (Signed)
Scheduled per los. Called and left msg. Mailed printout  °

## 2019-04-10 ENCOUNTER — Telehealth: Payer: Self-pay | Admitting: Internal Medicine

## 2019-04-10 ENCOUNTER — Inpatient Hospital Stay: Payer: Medicare HMO

## 2019-04-10 ENCOUNTER — Other Ambulatory Visit: Payer: Medicare HMO

## 2019-04-10 NOTE — Telephone Encounter (Signed)
Returned patient's phone call regarding rescheduling 11/11 appointment, per patient's request appointment has moved to 11/12.

## 2019-04-11 ENCOUNTER — Other Ambulatory Visit: Payer: Medicare HMO

## 2019-04-11 ENCOUNTER — Telehealth: Payer: Self-pay | Admitting: Internal Medicine

## 2019-04-11 NOTE — Telephone Encounter (Signed)
Returned patient's phone call regarding cancelling 11/12 appointment, per patient's request appointment has been cancelled.

## 2019-04-15 ENCOUNTER — Ambulatory Visit (INDEPENDENT_AMBULATORY_CARE_PROVIDER_SITE_OTHER): Payer: Medicare HMO | Admitting: *Deleted

## 2019-04-15 DIAGNOSIS — I255 Ischemic cardiomyopathy: Secondary | ICD-10-CM | POA: Diagnosis not present

## 2019-04-15 DIAGNOSIS — I5042 Chronic combined systolic (congestive) and diastolic (congestive) heart failure: Secondary | ICD-10-CM

## 2019-04-16 ENCOUNTER — Other Ambulatory Visit: Payer: Self-pay | Admitting: *Deleted

## 2019-04-16 ENCOUNTER — Encounter: Payer: Self-pay | Admitting: *Deleted

## 2019-04-16 DIAGNOSIS — C3491 Malignant neoplasm of unspecified part of right bronchus or lung: Secondary | ICD-10-CM

## 2019-04-16 DIAGNOSIS — C3431 Malignant neoplasm of lower lobe, right bronchus or lung: Secondary | ICD-10-CM

## 2019-04-16 NOTE — Progress Notes (Signed)
[facility] Work  Clinical Social Work received phone call from patient- he reported having major barriers getting to appointments.  Patient missed last week's lab visit and will need to miss this week because he has no transportation to appointment from home in [place], [place].   [place] discussed case with medical oncology RN and Solicitor.  Orders were placed for patient to receive labs at [facility] (closer to patient's home) and will utilize [facility] transportation program.  Patient made aware and appreciative of plan.  Gwinda Maine, LCSW  Clinical Social Worker [facility]

## 2019-04-16 NOTE — Progress Notes (Signed)
Labs faxed to [facility]. Pt to walk in for labs anytime between 8/-4.

## 2019-04-17 ENCOUNTER — Inpatient Hospital Stay: Payer: Medicare HMO

## 2019-04-17 ENCOUNTER — Telehealth: Payer: Self-pay | Admitting: Internal Medicine

## 2019-04-17 NOTE — Telephone Encounter (Signed)
Returned patient's phone call regarding rescheduling an appointment, per patient's request 11/18 appointment has moved to 11/19.

## 2019-04-18 ENCOUNTER — Inpatient Hospital Stay: Payer: Medicare HMO

## 2019-04-18 ENCOUNTER — Other Ambulatory Visit (HOSPITAL_COMMUNITY)
Admission: RE | Admit: 2019-04-18 | Discharge: 2019-04-18 | Disposition: A | Discharge status: Home / Self Care | Payer: Medicare HMO | Source: Ambulatory Visit | Attending: Internal Medicine | Admitting: Internal Medicine

## 2019-04-18 DIAGNOSIS — C3431 Malignant neoplasm of lower lobe, right bronchus or lung: Secondary | ICD-10-CM | POA: Diagnosis present

## 2019-04-18 LAB — CBC WITH DIFFERENTIAL/PLATELET
Abs Immature Granulocytes: 0.02 10*3/uL (ref 0.00–0.07)
Basophils Absolute: 0 10*3/uL (ref 0.0–0.1)
Basophils Relative: 0 %
Eosinophils Absolute: 0 10*3/uL (ref 0.0–0.5)
Eosinophils Relative: 1 %
HCT: 27.7 % — ABNORMAL LOW (ref 39.0–52.0)
Hemoglobin: 9 g/dL — ABNORMAL LOW (ref 13.0–17.0)
Immature Granulocytes: 1 %
Lymphocytes Relative: 33 %
Lymphs Abs: 0.9 10*3/uL (ref 0.7–4.0)
MCH: 28.4 pg (ref 26.0–34.0)
MCHC: 32.5 g/dL (ref 30.0–36.0)
MCV: 87.4 fL (ref 80.0–100.0)
Monocytes Absolute: 0.7 10*3/uL (ref 0.1–1.0)
Monocytes Relative: 24 %
Neutro Abs: 1.1 10*3/uL — ABNORMAL LOW (ref 1.7–7.7)
Neutrophils Relative %: 41 %
Platelets: 305 10*3/uL (ref 150–400)
RBC: 3.17 MIL/uL — ABNORMAL LOW (ref 4.22–5.81)
RDW: 14.9 % (ref 11.5–15.5)
WBC: 2.8 10*3/uL — ABNORMAL LOW (ref 4.0–10.5)
nRBC: 0 % (ref 0.0–0.2)

## 2019-04-18 LAB — COMPREHENSIVE METABOLIC PANEL
ALT: 15 U/L (ref 0–44)
AST: 14 U/L — ABNORMAL LOW (ref 15–41)
Albumin: 3 g/dL — ABNORMAL LOW (ref 3.5–5.0)
Alkaline Phosphatase: 50 U/L (ref 38–126)
Anion gap: 7 (ref 5–15)
BUN: 10 mg/dL (ref 6–20)
CO2: 25 mmol/L (ref 22–32)
Calcium: 9.3 mg/dL (ref 8.9–10.3)
Chloride: 101 mmol/L (ref 98–111)
Creatinine, Ser: 0.89 mg/dL (ref 0.61–1.24)
GFR calc Af Amer: >60 mL/min (ref >60)
GFR calc non Af Amer: >60 mL/min (ref >60)
Glucose, Bld: 97 mg/dL (ref 70–99)
Potassium: 5.1 mmol/L (ref 3.5–5.1)
Sodium: 133 mmol/L — ABNORMAL LOW (ref 135–145)
Total Bilirubin: 0.2 mg/dL — ABNORMAL LOW (ref 0.3–1.2)
Total Protein: 8.1 g/dL (ref 6.5–8.1)

## 2019-04-19 LAB — CUP PACEART REMOTE DEVICE CHECK
Date Time Interrogation Session: 20201120160417
Implantable Lead Implant Date: 20140929
Implantable Lead Location: 753860
Implantable Lead Model: 181
Implantable Lead Serial Number: 325827
Implantable Pulse Generator Implant Date: 20140929
Pulse Gen Serial Number: 7132440

## 2019-04-22 ENCOUNTER — Telehealth: Payer: Self-pay | Admitting: Internal Medicine

## 2019-04-22 NOTE — Telephone Encounter (Signed)
Returned patient's phone call regarding rescheduling 11/25 appointment time, informed patient I will need further assistance before rescheduling. Sent providers nurse a scheduled message.

## 2019-04-23 ENCOUNTER — Telehealth: Payer: Self-pay | Admitting: Internal Medicine

## 2019-04-23 NOTE — Telephone Encounter (Signed)
Returned patient's phone call regarding rescheduling an appointment, per patient's request 11/25 appointment time has been rescheduled.

## 2019-04-24 ENCOUNTER — Inpatient Hospital Stay (HOSPITAL_BASED_OUTPATIENT_CLINIC_OR_DEPARTMENT_OTHER): Payer: Medicare HMO | Admitting: Internal Medicine

## 2019-04-24 ENCOUNTER — Inpatient Hospital Stay: Payer: Medicare HMO

## 2019-04-24 ENCOUNTER — Other Ambulatory Visit: Payer: Self-pay

## 2019-04-24 ENCOUNTER — Encounter: Payer: Self-pay | Admitting: Internal Medicine

## 2019-04-24 VITALS — BP 99/65 | HR 97 | Temp 98.0°F | Resp 18 | Ht 68.0 in | Wt 116.8 lb

## 2019-04-24 DIAGNOSIS — Z923 Personal history of irradiation: Secondary | ICD-10-CM | POA: Diagnosis not present

## 2019-04-24 DIAGNOSIS — C3431 Malignant neoplasm of lower lobe, right bronchus or lung: Secondary | ICD-10-CM | POA: Diagnosis not present

## 2019-04-24 DIAGNOSIS — C3491 Malignant neoplasm of unspecified part of right bronchus or lung: Secondary | ICD-10-CM | POA: Diagnosis not present

## 2019-04-24 DIAGNOSIS — C3490 Malignant neoplasm of unspecified part of unspecified bronchus or lung: Secondary | ICD-10-CM | POA: Diagnosis not present

## 2019-04-24 DIAGNOSIS — Z5112 Encounter for antineoplastic immunotherapy: Secondary | ICD-10-CM

## 2019-04-24 DIAGNOSIS — Z5111 Encounter for antineoplastic chemotherapy: Secondary | ICD-10-CM

## 2019-04-24 DIAGNOSIS — R630 Anorexia: Secondary | ICD-10-CM

## 2019-04-24 DIAGNOSIS — C61 Malignant neoplasm of prostate: Secondary | ICD-10-CM

## 2019-04-24 DIAGNOSIS — F329 Major depressive disorder, single episode, unspecified: Secondary | ICD-10-CM

## 2019-04-24 LAB — CMP (CANCER CENTER ONLY)
ALT: 12 U/L (ref 0–44)
AST: 14 U/L — ABNORMAL LOW (ref 15–41)
Albumin: 3.2 g/dL — ABNORMAL LOW (ref 3.5–5.0)
Alkaline Phosphatase: 60 U/L (ref 38–126)
Anion gap: 10 (ref 5–15)
BUN: 19 mg/dL (ref 6–20)
CO2: 22 mmol/L (ref 22–32)
Calcium: 9.3 mg/dL (ref 8.9–10.3)
Chloride: 102 mmol/L (ref 98–111)
Creatinine: 1.07 mg/dL (ref 0.61–1.24)
GFR, Est AFR Am: >60 mL/min (ref >60)
GFR, Estimated: >60 mL/min (ref >60)
Glucose, Bld: 114 mg/dL — ABNORMAL HIGH (ref 70–99)
Potassium: 4.6 mmol/L (ref 3.5–5.1)
Sodium: 134 mmol/L — ABNORMAL LOW (ref 135–145)
Total Bilirubin: <0.2 mg/dL — ABNORMAL LOW (ref 0.3–1.2)
Total Protein: 8.7 g/dL — ABNORMAL HIGH (ref 6.5–8.1)

## 2019-04-24 LAB — CBC WITH DIFFERENTIAL (CANCER CENTER ONLY)
Abs Immature Granulocytes: 0.08 10*3/uL — ABNORMAL HIGH (ref 0.00–0.07)
Basophils Absolute: 0 10*3/uL (ref 0.0–0.1)
Basophils Relative: 0 %
Eosinophils Absolute: 0.1 10*3/uL (ref 0.0–0.5)
Eosinophils Relative: 1 %
HCT: 26.7 % — ABNORMAL LOW (ref 39.0–52.0)
Hemoglobin: 9.1 g/dL — ABNORMAL LOW (ref 13.0–17.0)
Immature Granulocytes: 1 %
Lymphocytes Relative: 19 %
Lymphs Abs: 1.3 10*3/uL (ref 0.7–4.0)
MCH: 28.3 pg (ref 26.0–34.0)
MCHC: 34.1 g/dL (ref 30.0–36.0)
MCV: 83.2 fL (ref 80.0–100.0)
Monocytes Absolute: 1 10*3/uL (ref 0.1–1.0)
Monocytes Relative: 14 %
Neutro Abs: 4.4 10*3/uL (ref 1.7–7.7)
Neutrophils Relative %: 65 %
Platelet Count: 558 10*3/uL — ABNORMAL HIGH (ref 150–400)
RBC: 3.21 MIL/uL — ABNORMAL LOW (ref 4.22–5.81)
RDW: 15.5 % (ref 11.5–15.5)
WBC Count: 7 10*3/uL (ref 4.0–10.5)
nRBC: 0 % (ref 0.0–0.2)

## 2019-04-24 LAB — TSH: TSH: 0.868 u[IU]/mL (ref 0.320–4.118)

## 2019-04-24 MED ORDER — CYANOCOBALAMIN 1000 MCG/ML IJ SOLN
INTRAMUSCULAR | Status: AC
  Filled 2019-04-24: qty 1

## 2019-04-24 MED ORDER — SODIUM CHLORIDE 0.9 % IV SOLN
Freq: Once | INTRAVENOUS | Status: AC
  Administered 2019-04-24: 14:00:00 via INTRAVENOUS
  Filled 2019-04-24: qty 250

## 2019-04-24 MED ORDER — PALONOSETRON HCL INJECTION 0.25 MG/5ML
0.2500 mg | Freq: Once | INTRAVENOUS | Status: AC
  Administered 2019-04-24: 0.25 mg via INTRAVENOUS

## 2019-04-24 MED ORDER — CYANOCOBALAMIN 1000 MCG/ML IJ SOLN
1000.0000 ug | Freq: Once | INTRAMUSCULAR | Status: AC
  Administered 2019-04-24: 1000 ug via INTRAMUSCULAR

## 2019-04-24 MED ORDER — SODIUM CHLORIDE 0.9 % IV SOLN
420.0000 mg | Freq: Once | INTRAVENOUS | Status: AC
  Administered 2019-04-24: 420 mg via INTRAVENOUS
  Filled 2019-04-24: qty 42

## 2019-04-24 MED ORDER — PALONOSETRON HCL INJECTION 0.25 MG/5ML
INTRAVENOUS | Status: AC
  Filled 2019-04-24: qty 5

## 2019-04-24 MED ORDER — SODIUM CHLORIDE 0.9 % IV SOLN
200.0000 mg | Freq: Once | INTRAVENOUS | Status: AC
  Administered 2019-04-24: 200 mg via INTRAVENOUS
  Filled 2019-04-24: qty 8

## 2019-04-24 MED ORDER — SODIUM CHLORIDE 0.9 % IV SOLN
Freq: Once | INTRAVENOUS | Status: AC
  Administered 2019-04-24: 14:00:00 via INTRAVENOUS
  Filled 2019-04-24: qty 5

## 2019-04-24 MED ORDER — SODIUM CHLORIDE 0.9 % IV SOLN
500.0000 mg/m2 | Freq: Once | INTRAVENOUS | Status: AC
  Administered 2019-04-24: 800 mg via INTRAVENOUS
  Filled 2019-04-24: qty 12

## 2019-04-24 NOTE — Progress Notes (Signed)
Patient's calculated carboplatin dose with AUC 5 is ~414 mg (calculated CrCl of 57.8 mL/min using patient's today's weight of 53 kg and Scr of 1.07). Per Dr. [name] OK for patient to receive same carboplatin dose received on 04/03/19 of 420 mg since within 10% of calculated dose. Orders updated to reflect change.  Leron Croak, PharmD, BCPS PGY2 Hematology/Oncology Pharmacy Resident 04/24/2019 2:19 PM

## 2019-04-24 NOTE — Patient Instructions (Signed)
[facility] Discharge Instructions for Patients Receiving Chemotherapy  Today you received the following chemotherapy agents Keytruda, Alimta, and Carboplatin  To help prevent nausea and vomiting after your treatment, we encourage you to take your nausea medication as directed.  If you develop nausea and vomiting that is not controlled by your nausea medication, call the clinic.   BELOW ARE SYMPTOMS THAT SHOULD BE REPORTED IMMEDIATELY:  *FEVER GREATER THAN 100.5 F  *CHILLS WITH OR WITHOUT FEVER  NAUSEA AND VOMITING THAT IS NOT CONTROLLED WITH YOUR NAUSEA MEDICATION  *UNUSUAL SHORTNESS OF BREATH  *UNUSUAL BRUISING OR BLEEDING  TENDERNESS IN MOUTH AND THROAT WITH OR WITHOUT PRESENCE OF ULCERS  *URINARY PROBLEMS  *BOWEL PROBLEMS  UNUSUAL RASH Items with * indicate a potential emergency and should be followed up as soon as possible.  Feel free to call the clinic should you have any questions or concerns. The clinic phone number is [phone].  Please show the CHEMO ALERT CARD at check-in to the Emergency Department and triage nurse.   
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
## 2019-04-26 ENCOUNTER — Telehealth: Payer: Self-pay | Admitting: Internal Medicine

## 2019-04-26 NOTE — Telephone Encounter (Signed)
Scheduled per los. Called and spoke with patient. Confirmed upcoming appt

## 2019-05-13 NOTE — Progress Notes (Signed)
Remote ICD transmission.   
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
## 2019-05-14 ENCOUNTER — Ambulatory Visit (HOSPITAL_COMMUNITY)
Admission: RE | Admit: 2019-05-14 | Discharge: 2019-05-14 | Disposition: A | Discharge status: Home / Self Care | Payer: Medicare HMO | Source: Ambulatory Visit | Attending: Internal Medicine | Admitting: Internal Medicine

## 2019-05-14 ENCOUNTER — Other Ambulatory Visit: Payer: Self-pay

## 2019-05-14 ENCOUNTER — Telehealth: Payer: Self-pay | Admitting: *Deleted

## 2019-05-14 ENCOUNTER — Telehealth (INDEPENDENT_AMBULATORY_CARE_PROVIDER_SITE_OTHER): Payer: Medicare HMO | Admitting: Cardiology

## 2019-05-14 ENCOUNTER — Encounter: Payer: Self-pay | Admitting: Cardiology

## 2019-05-14 VITALS — BP 124/81 | Ht 68.0 in | Wt 117.0 lb

## 2019-05-14 DIAGNOSIS — I255 Ischemic cardiomyopathy: Secondary | ICD-10-CM

## 2019-05-14 DIAGNOSIS — I2510 Atherosclerotic heart disease of native coronary artery without angina pectoris: Secondary | ICD-10-CM | POA: Diagnosis not present

## 2019-05-14 DIAGNOSIS — C3490 Malignant neoplasm of unspecified part of unspecified bronchus or lung: Secondary | ICD-10-CM | POA: Insufficient documentation

## 2019-05-14 DIAGNOSIS — E785 Hyperlipidemia, unspecified: Secondary | ICD-10-CM

## 2019-05-14 MED ORDER — ATORVASTATIN CALCIUM 40 MG PO TABS: 40.0000 mg | ORAL_TABLET | Freq: Every day | ORAL | 3 refills | Status: DC

## 2019-05-14 MED ORDER — IOHEXOL 300 MG/ML  SOLN
100.0000 mL | Freq: Once | INTRAMUSCULAR | Status: AC | PRN
  Administered 2019-05-14: 08:00:00 100 mL via INTRAVENOUS

## 2019-05-14 NOTE — Patient Instructions (Signed)
Medication Instructions:  START ATORVASTATIN 40 MG ONCE DAILY  *If you need a refill on your cardiac medications before your next appointment, please call your pharmacy*  Lab Work: Your physician recommends that you return for lab work in: [place]  If you have labs (blood work) drawn today and your tests are completely normal, you will receive your results only by: Marland Kitchen MyChart Message (if you have MyChart) OR . A paper copy in the mail If you have any lab test that is abnormal or we need to change your treatment, we will call you to review the results.  Follow-Up: At [facility], you and your health needs are our priority.  As part of our continuing mission to provide you with exceptional heart care, we have created designated Provider Care Teams.  These Care Teams include your primary Cardiologist (physician) and Advanced Practice Providers (APPs -  Physician Assistants and Nurse Practitioners) who all work together to provide you with the care you need, when you need it.  Your next appointment:   6 month(s)  The format for your next appointment:   Either In Person or Virtual  Provider:   You may see Kirk Ruths, MD or one of the following Advanced Practice Providers on your designated Care Team:    Kerin Ransom, PA-C  [place], [place]  Coletta Memos, [place]

## 2019-05-14 NOTE — Telephone Encounter (Signed)
[name]  Clinical Social Norman received notification from [employer] that patient did not have a ride home from appointment at [place] today.  Transportation coordinator was able to identify another transportation company to transport patient home.  CSW contacted patient by phone.  CSW explained we cannot guarantee there will be availability in the area to transport him home from appointments at [facility].  Patient has no friends or family available to transport him.  He reported he cancelled his infusion appointment tomorrow, as he has no way to get to [facility]    CSW discussed possibility of transferring his medical care to facility in [place], [place].  Patient agrees that would be more convenient and reduce missed appointments.  Patient would like time to think about this decision and will follow up with CSW.   [place]winda [place], LCSW  Clinical Social Worker [facility]

## 2019-05-15 ENCOUNTER — Inpatient Hospital Stay: Payer: Medicare HMO | Admitting: Internal Medicine

## 2019-05-15 ENCOUNTER — Inpatient Hospital Stay: Payer: Medicare HMO
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
## 2019-05-20 ENCOUNTER — Inpatient Hospital Stay: Payer: Medicare HMO | Attending: Physician Assistant

## 2019-05-20 ENCOUNTER — Encounter: Payer: Self-pay | Admitting: Physician Assistant

## 2019-05-20 ENCOUNTER — Inpatient Hospital Stay (HOSPITAL_BASED_OUTPATIENT_CLINIC_OR_DEPARTMENT_OTHER): Payer: Medicare HMO | Admitting: Physician Assistant

## 2019-05-20 ENCOUNTER — Other Ambulatory Visit: Payer: Self-pay

## 2019-05-20 ENCOUNTER — Inpatient Hospital Stay: Payer: Medicare HMO

## 2019-05-20 VITALS — BP 115/76 | HR 94 | Temp 98.3°F | Resp 19 | Ht 68.0 in | Wt 118.1 lb

## 2019-05-20 DIAGNOSIS — Z5112 Encounter for antineoplastic immunotherapy: Secondary | ICD-10-CM | POA: Insufficient documentation

## 2019-05-20 DIAGNOSIS — Z923 Personal history of irradiation: Secondary | ICD-10-CM | POA: Insufficient documentation

## 2019-05-20 DIAGNOSIS — I509 Heart failure, unspecified: Secondary | ICD-10-CM | POA: Diagnosis not present

## 2019-05-20 DIAGNOSIS — B441 Other pulmonary aspergillosis: Secondary | ICD-10-CM | POA: Diagnosis not present

## 2019-05-20 DIAGNOSIS — C3411 Malignant neoplasm of upper lobe, right bronchus or lung: Secondary | ICD-10-CM | POA: Insufficient documentation

## 2019-05-20 DIAGNOSIS — I700 Atherosclerosis of aorta: Secondary | ICD-10-CM | POA: Insufficient documentation

## 2019-05-20 DIAGNOSIS — C3491 Malignant neoplasm of unspecified part of right bronchus or lung: Secondary | ICD-10-CM

## 2019-05-20 DIAGNOSIS — J449 Chronic obstructive pulmonary disease, unspecified: Secondary | ICD-10-CM | POA: Insufficient documentation

## 2019-05-20 DIAGNOSIS — R5383 Other fatigue: Secondary | ICD-10-CM | POA: Diagnosis not present

## 2019-05-20 DIAGNOSIS — Z5111 Encounter for antineoplastic chemotherapy: Secondary | ICD-10-CM | POA: Diagnosis present

## 2019-05-20 DIAGNOSIS — Z85118 Personal history of other malignant neoplasm of bronchus and lung: Secondary | ICD-10-CM | POA: Insufficient documentation

## 2019-05-20 DIAGNOSIS — I255 Ischemic cardiomyopathy: Secondary | ICD-10-CM | POA: Insufficient documentation

## 2019-05-20 DIAGNOSIS — E785 Hyperlipidemia, unspecified: Secondary | ICD-10-CM | POA: Insufficient documentation

## 2019-05-20 DIAGNOSIS — C61 Malignant neoplasm of prostate: Secondary | ICD-10-CM | POA: Insufficient documentation

## 2019-05-20 DIAGNOSIS — I2510 Atherosclerotic heart disease of native coronary artery without angina pectoris: Secondary | ICD-10-CM | POA: Insufficient documentation

## 2019-05-20 DIAGNOSIS — R9389 Abnormal findings on diagnostic imaging of other specified body structures: Secondary | ICD-10-CM | POA: Diagnosis not present

## 2019-05-20 DIAGNOSIS — I252 Old myocardial infarction: Secondary | ICD-10-CM | POA: Insufficient documentation

## 2019-05-20 DIAGNOSIS — Z79899 Other long term (current) drug therapy: Secondary | ICD-10-CM | POA: Diagnosis not present

## 2019-05-20 DIAGNOSIS — J168 Pneumonia due to other specified infectious organisms: Secondary | ICD-10-CM | POA: Insufficient documentation

## 2019-05-20 LAB — CMP (CANCER CENTER ONLY)
ALT: 9 U/L (ref 0–44)
AST: 14 U/L — ABNORMAL LOW (ref 15–41)
Albumin: 3.5 g/dL (ref 3.5–5.0)
Alkaline Phosphatase: 63 U/L (ref 38–126)
Anion gap: 12 (ref 5–15)
BUN: 19 mg/dL (ref 6–20)
CO2: 21 mmol/L — ABNORMAL LOW (ref 22–32)
Calcium: 9.6 mg/dL (ref 8.9–10.3)
Chloride: 102 mmol/L (ref 98–111)
Creatinine: 1.12 mg/dL (ref 0.61–1.24)
GFR, Est AFR Am: >60 mL/min (ref >60)
GFR, Estimated: >60 mL/min (ref >60)
Glucose, Bld: 109 mg/dL — ABNORMAL HIGH (ref 70–99)
Potassium: 4.7 mmol/L (ref 3.5–5.1)
Sodium: 135 mmol/L (ref 135–145)
Total Bilirubin: <0.2 mg/dL — ABNORMAL LOW (ref 0.3–1.2)
Total Protein: 9.2 g/dL — ABNORMAL HIGH (ref 6.5–8.1)

## 2019-05-20 LAB — CBC WITH DIFFERENTIAL (CANCER CENTER ONLY)
Abs Immature Granulocytes: 0.06 10*3/uL (ref 0.00–0.07)
Basophils Absolute: 0 10*3/uL (ref 0.0–0.1)
Basophils Relative: 1 %
Eosinophils Absolute: 0.2 10*3/uL (ref 0.0–0.5)
Eosinophils Relative: 3 %
HCT: 32.4 % — ABNORMAL LOW (ref 39.0–52.0)
Hemoglobin: 10.4 g/dL — ABNORMAL LOW (ref 13.0–17.0)
Immature Granulocytes: 1 %
Lymphocytes Relative: 18 %
Lymphs Abs: 1.2 10*3/uL (ref 0.7–4.0)
MCH: 28.3 pg (ref 26.0–34.0)
MCHC: 32.1 g/dL (ref 30.0–36.0)
MCV: 88 fL (ref 80.0–100.0)
Monocytes Absolute: 0.9 10*3/uL (ref 0.1–1.0)
Monocytes Relative: 13 %
Neutro Abs: 4.2 10*3/uL (ref 1.7–7.7)
Neutrophils Relative %: 64 %
Platelet Count: 429 10*3/uL — ABNORMAL HIGH (ref 150–400)
RBC: 3.68 MIL/uL — ABNORMAL LOW (ref 4.22–5.81)
RDW: 18.1 % — ABNORMAL HIGH (ref 11.5–15.5)
WBC Count: 6.6 10*3/uL (ref 4.0–10.5)
nRBC: 0 % (ref 0.0–0.2)

## 2019-05-20 LAB — TSH: TSH: 1.908 u[IU]/mL (ref 0.320–4.118)

## 2019-05-20 MED ORDER — PALONOSETRON HCL INJECTION 0.25 MG/5ML
INTRAVENOUS | Status: AC
  Filled 2019-05-20: qty 5

## 2019-05-20 MED ORDER — PALONOSETRON HCL INJECTION 0.25 MG/5ML
0.2500 mg | Freq: Once | INTRAVENOUS | Status: AC
  Administered 2019-05-20: 0.25 mg via INTRAVENOUS

## 2019-05-20 MED ORDER — SODIUM CHLORIDE 0.9 % IV SOLN
200.0000 mg | Freq: Once | INTRAVENOUS | Status: AC
  Administered 2019-05-20: 200 mg via INTRAVENOUS
  Filled 2019-05-20: qty 8

## 2019-05-20 MED ORDER — SODIUM CHLORIDE 0.9 % IV SOLN
398.5000 mg | Freq: Once | INTRAVENOUS | Status: AC
  Administered 2019-05-20: 400 mg via INTRAVENOUS
  Filled 2019-05-20: qty 40

## 2019-05-20 MED ORDER — SODIUM CHLORIDE 0.9 % IV SOLN
500.0000 mg/m2 | Freq: Once | INTRAVENOUS | Status: AC
  Administered 2019-05-20: 800 mg via INTRAVENOUS
  Filled 2019-05-20: qty 20

## 2019-05-20 MED ORDER — SODIUM CHLORIDE 0.9 % IV SOLN
Freq: Once | INTRAVENOUS | Status: AC
  Filled 2019-05-20: qty 250

## 2019-05-20 MED ORDER — SODIUM CHLORIDE 0.9 % IV SOLN
Freq: Once | INTRAVENOUS | Status: AC
  Filled 2019-05-20: qty 5

## 2019-05-20 NOTE — Patient Instructions (Signed)
[facility] Discharge Instructions for Patients Receiving Chemotherapy  Today you received the following chemotherapy agents Keytruda, Alimta, and Carboplatin  To help prevent nausea and vomiting after your treatment, we encourage you to take your nausea medication as directed.  If you develop nausea and vomiting that is not controlled by your nausea medication, call the clinic.   BELOW ARE SYMPTOMS THAT SHOULD BE REPORTED IMMEDIATELY:  *FEVER GREATER THAN 100.5 F  *CHILLS WITH OR WITHOUT FEVER  NAUSEA AND VOMITING THAT IS NOT CONTROLLED WITH YOUR NAUSEA MEDICATION  *UNUSUAL SHORTNESS OF BREATH  *UNUSUAL BRUISING OR BLEEDING  TENDERNESS IN MOUTH AND THROAT WITH OR WITHOUT PRESENCE OF ULCERS  *URINARY PROBLEMS  *BOWEL PROBLEMS  UNUSUAL RASH Items with * indicate a potential emergency and should be followed up as soon as possible.  Feel free to call the clinic should you have any questions or concerns. The clinic phone number is [phone].  Please show the CHEMO ALERT CARD at check-in to the Emergency Department and triage nurse.   

## 2019-05-21 ENCOUNTER — Telehealth: Payer: Self-pay | Admitting: Internal Medicine

## 2019-05-21 NOTE — Telephone Encounter (Signed)
Scheduled per los. Called and spoke with patient. Confirmed appt 

## 2019-05-27 ENCOUNTER — Other Ambulatory Visit: Payer: Medicare HMO

## 2019-05-28 ENCOUNTER — Telehealth: Payer: Self-pay | Admitting: Internal Medicine

## 2019-05-28 NOTE — Telephone Encounter (Signed)
Returned patient's phone call regarding rescheduling an appointment, per patient's request 12/28 appointment has moved to 12/31.

## 2019-05-30 ENCOUNTER — Inpatient Hospital Stay: Payer: Medicare HMO

## 2019-05-30 ENCOUNTER — Other Ambulatory Visit: Payer: Self-pay

## 2019-05-30 DIAGNOSIS — C3491 Malignant neoplasm of unspecified part of right bronchus or lung: Secondary | ICD-10-CM

## 2019-05-30 DIAGNOSIS — Z5112 Encounter for antineoplastic immunotherapy: Secondary | ICD-10-CM | POA: Diagnosis not present

## 2019-05-30 LAB — CMP (CANCER CENTER ONLY)
ALT: 10 U/L (ref 0–44)
AST: 13 U/L — ABNORMAL LOW (ref 15–41)
Albumin: 3.5 g/dL (ref 3.5–5.0)
Alkaline Phosphatase: 65 U/L (ref 38–126)
Anion gap: 10 (ref 5–15)
BUN: 17 mg/dL (ref 6–20)
CO2: 24 mmol/L (ref 22–32)
Calcium: 9.4 mg/dL (ref 8.9–10.3)
Chloride: 99 mmol/L (ref 98–111)
Creatinine: 1.04 mg/dL (ref 0.61–1.24)
GFR, Est AFR Am: >60 mL/min (ref >60)
GFR, Estimated: >60 mL/min (ref >60)
Glucose, Bld: 100 mg/dL — ABNORMAL HIGH (ref 70–99)
Potassium: 4.4 mmol/L (ref 3.5–5.1)
Sodium: 133 mmol/L — ABNORMAL LOW (ref 135–145)
Total Bilirubin: <0.2 mg/dL — ABNORMAL LOW (ref 0.3–1.2)
Total Protein: 8.6 g/dL — ABNORMAL HIGH (ref 6.5–8.1)

## 2019-05-30 LAB — CBC WITH DIFFERENTIAL (CANCER CENTER ONLY)
Abs Immature Granulocytes: 0.01 10*3/uL (ref 0.00–0.07)
Basophils Absolute: 0 10*3/uL (ref 0.0–0.1)
Basophils Relative: 1 %
Eosinophils Absolute: 0.1 10*3/uL (ref 0.0–0.5)
Eosinophils Relative: 2 %
HCT: 27.1 % — ABNORMAL LOW (ref 39.0–52.0)
Hemoglobin: 9.2 g/dL — ABNORMAL LOW (ref 13.0–17.0)
Immature Granulocytes: 0 %
Lymphocytes Relative: 29 %
Lymphs Abs: 1.2 10*3/uL (ref 0.7–4.0)
MCH: 29.1 pg (ref 26.0–34.0)
MCHC: 33.9 g/dL (ref 30.0–36.0)
MCV: 85.8 fL (ref 80.0–100.0)
Monocytes Absolute: 0.6 10*3/uL (ref 0.1–1.0)
Monocytes Relative: 15 %
Neutro Abs: 2.1 10*3/uL (ref 1.7–7.7)
Neutrophils Relative %: 53 %
Platelet Count: 228 10*3/uL (ref 150–400)
RBC: 3.16 MIL/uL — ABNORMAL LOW (ref 4.22–5.81)
RDW: 17.5 % — ABNORMAL HIGH (ref 11.5–15.5)
WBC Count: 4 10*3/uL (ref 4.0–10.5)
nRBC: 0 % (ref 0.0–0.2)

## 2019-06-03 ENCOUNTER — Inpatient Hospital Stay: Payer: Medicare HMO

## 2019-06-03 ENCOUNTER — Other Ambulatory Visit: Payer: Medicare HMO

## 2019-06-03 ENCOUNTER — Telehealth: Payer: Self-pay | Admitting: Internal Medicine

## 2019-06-03 NOTE — Telephone Encounter (Signed)
Returned patient's phone call regarding rescheduling 01/04 appointment, per patient's request appointment has moved to 01/05.

## 2019-06-05 ENCOUNTER — Ambulatory Visit: Payer: Medicare HMO | Admitting: Internal Medicine

## 2019-06-05 ENCOUNTER — Other Ambulatory Visit: Payer: Medicare HMO

## 2019-06-05 ENCOUNTER — Ambulatory Visit: Payer: Medicare HMO

## 2019-06-10 ENCOUNTER — Inpatient Hospital Stay: Payer: Medicare HMO

## 2019-06-10 ENCOUNTER — Encounter: Payer: Self-pay | Admitting: Primary Care

## 2019-06-10 ENCOUNTER — Encounter: Payer: Self-pay | Admitting: Internal Medicine

## 2019-06-10 ENCOUNTER — Other Ambulatory Visit: Payer: Self-pay

## 2019-06-10 ENCOUNTER — Inpatient Hospital Stay: Payer: Medicare HMO | Attending: Physician Assistant | Admitting: Internal Medicine

## 2019-06-10 ENCOUNTER — Ambulatory Visit (INDEPENDENT_AMBULATORY_CARE_PROVIDER_SITE_OTHER): Payer: Medicare HMO | Admitting: Primary Care

## 2019-06-10 VITALS — BP 92/58 | HR 105 | Temp 97.3°F | Ht 68.0 in | Wt 122.6 lb

## 2019-06-10 VITALS — BP 106/57 | HR 99 | Temp 98.0°F | Resp 19 | Ht 68.0 in | Wt 122.0 lb

## 2019-06-10 DIAGNOSIS — F329 Major depressive disorder, single episode, unspecified: Secondary | ICD-10-CM | POA: Insufficient documentation

## 2019-06-10 DIAGNOSIS — I700 Atherosclerosis of aorta: Secondary | ICD-10-CM | POA: Diagnosis not present

## 2019-06-10 DIAGNOSIS — R630 Anorexia: Secondary | ICD-10-CM | POA: Insufficient documentation

## 2019-06-10 DIAGNOSIS — C3491 Malignant neoplasm of unspecified part of right bronchus or lung: Secondary | ICD-10-CM

## 2019-06-10 DIAGNOSIS — I252 Old myocardial infarction: Secondary | ICD-10-CM | POA: Insufficient documentation

## 2019-06-10 DIAGNOSIS — R5383 Other fatigue: Secondary | ICD-10-CM | POA: Insufficient documentation

## 2019-06-10 DIAGNOSIS — Z85118 Personal history of other malignant neoplasm of bronchus and lung: Secondary | ICD-10-CM | POA: Insufficient documentation

## 2019-06-10 DIAGNOSIS — J8410 Pulmonary fibrosis, unspecified: Secondary | ICD-10-CM

## 2019-06-10 DIAGNOSIS — J431 Panlobular emphysema: Secondary | ICD-10-CM | POA: Diagnosis not present

## 2019-06-10 DIAGNOSIS — Z5112 Encounter for antineoplastic immunotherapy: Secondary | ICD-10-CM

## 2019-06-10 DIAGNOSIS — I255 Ischemic cardiomyopathy: Secondary | ICD-10-CM | POA: Insufficient documentation

## 2019-06-10 DIAGNOSIS — Z7951 Long term (current) use of inhaled steroids: Secondary | ICD-10-CM | POA: Insufficient documentation

## 2019-06-10 DIAGNOSIS — Z8611 Personal history of tuberculosis: Secondary | ICD-10-CM | POA: Diagnosis not present

## 2019-06-10 DIAGNOSIS — C61 Malignant neoplasm of prostate: Secondary | ICD-10-CM | POA: Diagnosis present

## 2019-06-10 DIAGNOSIS — J449 Chronic obstructive pulmonary disease, unspecified: Secondary | ICD-10-CM | POA: Insufficient documentation

## 2019-06-10 DIAGNOSIS — J441 Chronic obstructive pulmonary disease with (acute) exacerbation: Secondary | ICD-10-CM

## 2019-06-10 DIAGNOSIS — I2510 Atherosclerotic heart disease of native coronary artery without angina pectoris: Secondary | ICD-10-CM | POA: Diagnosis not present

## 2019-06-10 DIAGNOSIS — I509 Heart failure, unspecified: Secondary | ICD-10-CM | POA: Insufficient documentation

## 2019-06-10 DIAGNOSIS — Z5111 Encounter for antineoplastic chemotherapy: Secondary | ICD-10-CM | POA: Diagnosis present

## 2019-06-10 DIAGNOSIS — E785 Hyperlipidemia, unspecified: Secondary | ICD-10-CM | POA: Insufficient documentation

## 2019-06-10 DIAGNOSIS — C3431 Malignant neoplasm of lower lobe, right bronchus or lung: Secondary | ICD-10-CM

## 2019-06-10 DIAGNOSIS — Z79899 Other long term (current) drug therapy: Secondary | ICD-10-CM | POA: Insufficient documentation

## 2019-06-10 LAB — CBC WITH DIFFERENTIAL (CANCER CENTER ONLY)
Abs Immature Granulocytes: 0.02 10*3/uL (ref 0.00–0.07)
Basophils Absolute: 0 10*3/uL (ref 0.0–0.1)
Basophils Relative: 1 %
Eosinophils Absolute: 0.1 10*3/uL (ref 0.0–0.5)
Eosinophils Relative: 2 %
HCT: 30.2 % — ABNORMAL LOW (ref 39.0–52.0)
Hemoglobin: 10 g/dL — ABNORMAL LOW (ref 13.0–17.0)
Immature Granulocytes: 0 %
Lymphocytes Relative: 20 %
Lymphs Abs: 1 10*3/uL (ref 0.7–4.0)
MCH: 28.8 pg (ref 26.0–34.0)
MCHC: 33.1 g/dL (ref 30.0–36.0)
MCV: 87 fL (ref 80.0–100.0)
Monocytes Absolute: 0.8 10*3/uL (ref 0.1–1.0)
Monocytes Relative: 14 %
Neutro Abs: 3.4 10*3/uL (ref 1.7–7.7)
Neutrophils Relative %: 63 %
Platelet Count: 339 10*3/uL (ref 150–400)
RBC: 3.47 MIL/uL — ABNORMAL LOW (ref 4.22–5.81)
RDW: 17.5 % — ABNORMAL HIGH (ref 11.5–15.5)
WBC Count: 5.3 10*3/uL (ref 4.0–10.5)
nRBC: 0 % (ref 0.0–0.2)

## 2019-06-10 LAB — CMP (CANCER CENTER ONLY)
ALT: 10 U/L (ref 0–44)
AST: 14 U/L — ABNORMAL LOW (ref 15–41)
Albumin: 3.6 g/dL (ref 3.5–5.0)
Alkaline Phosphatase: 64 U/L (ref 38–126)
Anion gap: 14 (ref 5–15)
BUN: 17 mg/dL (ref 6–20)
CO2: 21 mmol/L — ABNORMAL LOW (ref 22–32)
Calcium: 9.6 mg/dL (ref 8.9–10.3)
Chloride: 102 mmol/L (ref 98–111)
Creatinine: 0.98 mg/dL (ref 0.61–1.24)
GFR, Est AFR Am: >60 mL/min (ref >60)
GFR, Estimated: >60 mL/min (ref >60)
Glucose, Bld: 104 mg/dL — ABNORMAL HIGH (ref 70–99)
Potassium: 5.5 mmol/L — ABNORMAL HIGH (ref 3.5–5.1)
Sodium: 137 mmol/L (ref 135–145)
Total Bilirubin: 0.2 mg/dL — ABNORMAL LOW (ref 0.3–1.2)
Total Protein: 9 g/dL — ABNORMAL HIGH (ref 6.5–8.1)

## 2019-06-10 LAB — TSH: TSH: 1.569 u[IU]/mL (ref 0.320–4.118)

## 2019-06-10 MED ORDER — FLUTICASONE-SALMETEROL 250-50 MCG/DOSE IN AEPB: 1.0000 | INHALATION_SPRAY | Freq: Two times a day (BID) | RESPIRATORY_TRACT | 5 refills | Status: DC

## 2019-06-10 MED ORDER — PROCHLORPERAZINE MALEATE 10 MG PO TABS
ORAL_TABLET | ORAL | Status: AC
  Filled 2019-06-10: qty 1

## 2019-06-10 MED ORDER — SODIUM CHLORIDE 0.9 % IV SOLN
Freq: Once | INTRAVENOUS | Status: AC
  Filled 2019-06-10: qty 250

## 2019-06-10 MED ORDER — SODIUM CHLORIDE 0.9 % IV SOLN
500.0000 mg/m2 | Freq: Once | INTRAVENOUS | Status: AC
  Administered 2019-06-10: 800 mg via INTRAVENOUS
  Filled 2019-06-10: qty 12

## 2019-06-10 MED ORDER — SODIUM CHLORIDE 0.9 % IV SOLN
200.0000 mg | Freq: Once | INTRAVENOUS | Status: AC
  Administered 2019-06-10: 200 mg via INTRAVENOUS
  Filled 2019-06-10: qty 8

## 2019-06-10 MED ORDER — PROCHLORPERAZINE MALEATE 10 MG PO TABS
10.0000 mg | ORAL_TABLET | Freq: Once | ORAL | Status: AC
  Administered 2019-06-10: 10 mg via ORAL

## 2019-06-10 NOTE — Assessment & Plan Note (Signed)
-   Chronic cough with some mucus production, reports sob is baseline - On exam patient had some wheezing left lung - Plan increase Advair 250/50 one puff twice daily - Continue Spiriva handihaler once daily  - FU in 3-4 months with PFTs
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
## 2019-06-10 NOTE — Patient Instructions (Signed)
[facility] Discharge Instructions for Patients Receiving Chemotherapy  Today you received the following chemotherapy agents: Keytruda, Alimta.  To help prevent nausea and vomiting after your treatment, we encourage you to take your nausea medication as directed.   If you develop nausea and vomiting that is not controlled by your nausea medication, call the clinic.   BELOW ARE SYMPTOMS THAT SHOULD BE REPORTED IMMEDIATELY:  *FEVER GREATER THAN 100.5 F  *CHILLS WITH OR WITHOUT FEVER  NAUSEA AND VOMITING THAT IS NOT CONTROLLED WITH YOUR NAUSEA MEDICATION  *UNUSUAL SHORTNESS OF BREATH  *UNUSUAL BRUISING OR BLEEDING  TENDERNESS IN MOUTH AND THROAT WITH OR WITHOUT PRESENCE OF ULCERS  *URINARY PROBLEMS  *BOWEL PROBLEMS  UNUSUAL RASH Items with * indicate a potential emergency and should be followed up as soon as possible.  Feel free to call the clinic should you have any questions or concerns. The clinic phone number is [phone].  Please show the [facility] at check-in to the Emergency Department and triage nurse.

## 2019-06-10 NOTE — Patient Instructions (Signed)
Recommendations: - Increase Advair 250/50 - take 1 puff twice daily (rinse mouth after use) - Continue Spiriva handihaler 2 puffs once daily  Orders: - Sputum culture x 3  Referral: - Pharmacy for smoking cessation in 2-3 weeks if agreeable   Follow-up: - 3-4 months with PFTs/ Dr. Chase Caller only   - Monitor for fever, increase purulent mucus production or worsening shortness of breath    Chronic Obstructive Pulmonary Disease Chronic obstructive pulmonary disease (COPD) is a long-term (chronic) lung problem. When you have COPD, it is hard for air to get in and out of your lungs. Usually the condition gets worse over time, and your lungs will never return to normal. There are things you can do to keep yourself as healthy as possible.  Your doctor may treat your condition with: ? Medicines. ? Oxygen. ? Lung surgery.  Your doctor may also recommend: ? Rehabilitation. This includes steps to make your body work better. It may involve a team of specialists. ? Quitting smoking, if you smoke. ? Exercise and changes to your diet. ? Comfort measures (palliative care). Follow these instructions at home: Medicines  Take over-the-counter and prescription medicines only as told by your doctor.  Talk to your doctor before taking any cough or allergy medicines. You may need to avoid medicines that cause your lungs to be dry. Lifestyle  If you smoke, stop. Smoking makes the problem worse. If you need help quitting, ask your doctor.  Avoid being around things that make your breathing worse. This may include smoke, chemicals, and fumes.  Stay active, but remember to rest as well.  Learn and use tips on how to relax.  Make sure you get enough sleep. Most adults need at least 7 hours of sleep every night.  Eat healthy foods. Eat smaller meals more often. Rest before meals. Controlled breathing Learn and use tips on how to control your breathing as told by your doctor. Try:  Breathing  in (inhaling) through your nose for 1 second. Then, pucker your lips and breath out (exhale) through your lips for 2 seconds.  Putting one hand on your belly (abdomen). Breathe in slowly through your nose for 1 second. Your hand on your belly should move out. Pucker your lips and breathe out slowly through your lips. Your hand on your belly should move in as you breathe out.  Controlled coughing Learn and use controlled coughing to clear mucus from your lungs. Follow these steps: 1. Lean your head a little forward. 2. Breathe in deeply. 3. Try to hold your breath for 3 seconds. 4. Keep your mouth slightly open while coughing 2 times. 5. Spit any mucus out into a tissue. 6. Rest and do the steps again 1 or 2 times as needed. General instructions  Make sure you get all the shots (vaccines) that your doctor recommends. Ask your doctor about a flu shot and a pneumonia shot.  Use oxygen therapy and pulmonary rehabilitation if told by your doctor. If you need home oxygen therapy, ask your doctor if you should buy a tool to measure your oxygen level (oximeter).  Make a COPD action plan with your doctor. This helps you to know what to do if you feel worse than usual.  Manage any other conditions you have as told by your doctor.  Avoid going outside when it is very hot, cold, or humid.  Avoid people who have a sickness you can catch (contagious).  Keep all follow-up visits as told by your doctor.  This is important. Contact a doctor if:  You cough up more mucus than usual.  There is a change in the color or thickness of the mucus.  It is harder to breathe than usual.  Your breathing is faster than usual.  You have trouble sleeping.  You need to use your medicines more often than usual.  You have trouble doing your normal activities such as getting dressed or walking around the house. Get help right away if:  You have shortness of breath while resting.  You have shortness of breath  that stops you from: ? Being able to talk. ? Doing normal activities.  Your chest hurts for longer than 5 minutes.  Your skin color is more blue than usual.  Your pulse oximeter shows that you have low oxygen for longer than 5 minutes.  You have a fever.  You feel too tired to breathe normally. Summary  Chronic obstructive pulmonary disease (COPD) is a long-term lung problem.  The way your lungs work will never return to normal. Usually the condition gets worse over time. There are things you can do to keep yourself as healthy as possible.  Take over-the-counter and prescription medicines only as told by your doctor.  If you smoke, stop. Smoking makes the problem worse. This information is not intended to replace advice given to you by your health care provider. Make sure you discuss any questions you have with your health care provider. Document Revised: 04/28/2017 Document Reviewed: 06/20/2016 Elsevier Patient Education  2020 [employer].

## 2019-06-10 NOTE — Assessment & Plan Note (Addendum)
-   Hx +PPD and pulmonary TB age 59; previous imaging showing LUL fibrosis with bronchiectasis  - Most recent CT chest showed chronic fungal infection LUL - Patient has no active symptoms except for chronic cough with mucus production.Denies fever, night sweats, chills, chest pain.  - Plan check sputum culture (resp, AFB, fungal)
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
## 2019-07-04 ENCOUNTER — Telehealth: Payer: Self-pay | Admitting: Internal Medicine

## 2019-07-04 ENCOUNTER — Telehealth: Payer: Self-pay | Admitting: Medical Oncology

## 2019-07-04 NOTE — Telephone Encounter (Signed)
04/24/2019 1419 - Curt Bears, MD  Dispositions: Return in about 3 weeks (around 05/15/2019) for Lab, Scan.  Check-out note: Weekly lab during chemotherapy. Chemotherapy and follow-up visit every 3 weeks. CT scan before next MD visit in 3 weeks.  Please make sure the patient has his a scan done at [facility].

## 2019-07-04 NOTE — Telephone Encounter (Signed)
I talk with patient regarding 2/9

## 2019-07-04 NOTE — Telephone Encounter (Signed)
When is his next appt. ? Schedule message sent to be seen tomorrow.

## 2019-07-09 ENCOUNTER — Inpatient Hospital Stay: Payer: Medicare HMO

## 2019-07-09 ENCOUNTER — Encounter: Payer: Self-pay | Admitting: Internal Medicine

## 2019-07-09 ENCOUNTER — Inpatient Hospital Stay (HOSPITAL_BASED_OUTPATIENT_CLINIC_OR_DEPARTMENT_OTHER): Payer: Medicare HMO | Admitting: Internal Medicine

## 2019-07-09 ENCOUNTER — Other Ambulatory Visit: Payer: Self-pay

## 2019-07-09 ENCOUNTER — Inpatient Hospital Stay: Payer: Medicare HMO | Attending: Physician Assistant

## 2019-07-09 DIAGNOSIS — Z79899 Other long term (current) drug therapy: Secondary | ICD-10-CM | POA: Diagnosis not present

## 2019-07-09 DIAGNOSIS — R630 Anorexia: Secondary | ICD-10-CM | POA: Insufficient documentation

## 2019-07-09 DIAGNOSIS — Z5111 Encounter for antineoplastic chemotherapy: Secondary | ICD-10-CM | POA: Diagnosis present

## 2019-07-09 DIAGNOSIS — C61 Malignant neoplasm of prostate: Secondary | ICD-10-CM | POA: Insufficient documentation

## 2019-07-09 DIAGNOSIS — E785 Hyperlipidemia, unspecified: Secondary | ICD-10-CM | POA: Diagnosis not present

## 2019-07-09 DIAGNOSIS — C3491 Malignant neoplasm of unspecified part of right bronchus or lung: Secondary | ICD-10-CM

## 2019-07-09 DIAGNOSIS — I509 Heart failure, unspecified: Secondary | ICD-10-CM | POA: Diagnosis not present

## 2019-07-09 DIAGNOSIS — I252 Old myocardial infarction: Secondary | ICD-10-CM | POA: Insufficient documentation

## 2019-07-09 DIAGNOSIS — I255 Ischemic cardiomyopathy: Secondary | ICD-10-CM | POA: Insufficient documentation

## 2019-07-09 DIAGNOSIS — J449 Chronic obstructive pulmonary disease, unspecified: Secondary | ICD-10-CM | POA: Insufficient documentation

## 2019-07-09 DIAGNOSIS — I2510 Atherosclerotic heart disease of native coronary artery without angina pectoris: Secondary | ICD-10-CM | POA: Insufficient documentation

## 2019-07-09 DIAGNOSIS — Z5112 Encounter for antineoplastic immunotherapy: Secondary | ICD-10-CM | POA: Diagnosis present

## 2019-07-09 DIAGNOSIS — C3490 Malignant neoplasm of unspecified part of unspecified bronchus or lung: Secondary | ICD-10-CM | POA: Diagnosis not present

## 2019-07-09 DIAGNOSIS — Z923 Personal history of irradiation: Secondary | ICD-10-CM | POA: Diagnosis not present

## 2019-07-09 DIAGNOSIS — C3431 Malignant neoplasm of lower lobe, right bronchus or lung: Secondary | ICD-10-CM | POA: Diagnosis not present

## 2019-07-09 DIAGNOSIS — Z85118 Personal history of other malignant neoplasm of bronchus and lung: Secondary | ICD-10-CM | POA: Diagnosis not present

## 2019-07-09 DIAGNOSIS — R5383 Other fatigue: Secondary | ICD-10-CM | POA: Diagnosis not present

## 2019-07-09 LAB — CBC WITH DIFFERENTIAL (CANCER CENTER ONLY)
Abs Immature Granulocytes: 0.01 10*3/uL (ref 0.00–0.07)
Basophils Absolute: 0 10*3/uL (ref 0.0–0.1)
Basophils Relative: 0 %
Eosinophils Absolute: 0.3 10*3/uL (ref 0.0–0.5)
Eosinophils Relative: 4 %
HCT: 31.5 % — ABNORMAL LOW (ref 39.0–52.0)
Hemoglobin: 10.3 g/dL — ABNORMAL LOW (ref 13.0–17.0)
Immature Granulocytes: 0 %
Lymphocytes Relative: 13 %
Lymphs Abs: 0.9 10*3/uL (ref 0.7–4.0)
MCH: 29.5 pg (ref 26.0–34.0)
MCHC: 32.7 g/dL (ref 30.0–36.0)
MCV: 90.3 fL (ref 80.0–100.0)
Monocytes Absolute: 0.8 10*3/uL (ref 0.1–1.0)
Monocytes Relative: 12 %
Neutro Abs: 5 10*3/uL (ref 1.7–7.7)
Neutrophils Relative %: 71 %
Platelet Count: 345 10*3/uL (ref 150–400)
RBC: 3.49 MIL/uL — ABNORMAL LOW (ref 4.22–5.81)
RDW: 16.4 % — ABNORMAL HIGH (ref 11.5–15.5)
WBC Count: 7.1 10*3/uL (ref 4.0–10.5)
nRBC: 0 % (ref 0.0–0.2)

## 2019-07-09 LAB — CMP (CANCER CENTER ONLY)
ALT: 9 U/L (ref 0–44)
AST: 14 U/L — ABNORMAL LOW (ref 15–41)
Albumin: 3.6 g/dL (ref 3.5–5.0)
Alkaline Phosphatase: 54 U/L (ref 38–126)
Anion gap: 11 (ref 5–15)
BUN: 26 mg/dL — ABNORMAL HIGH (ref 6–20)
CO2: 22 mmol/L (ref 22–32)
Calcium: 9.4 mg/dL (ref 8.9–10.3)
Chloride: 101 mmol/L (ref 98–111)
Creatinine: 1.53 mg/dL — ABNORMAL HIGH (ref 0.61–1.24)
GFR, Est AFR Am: 58 mL/min — ABNORMAL LOW (ref >60)
GFR, Estimated: 50 mL/min — ABNORMAL LOW (ref >60)
Glucose, Bld: 99 mg/dL (ref 70–99)
Potassium: 4.7 mmol/L (ref 3.5–5.1)
Sodium: 134 mmol/L — ABNORMAL LOW (ref 135–145)
Total Bilirubin: 0.3 mg/dL (ref 0.3–1.2)
Total Protein: 8.8 g/dL — ABNORMAL HIGH (ref 6.5–8.1)

## 2019-07-09 LAB — TSH: TSH: 1.457 u[IU]/mL (ref 0.320–4.118)

## 2019-07-09 MED ORDER — PROCHLORPERAZINE MALEATE 10 MG PO TABS
10.0000 mg | ORAL_TABLET | Freq: Once | ORAL | Status: AC
  Administered 2019-07-09: 10 mg via ORAL

## 2019-07-09 MED ORDER — SODIUM CHLORIDE 0.9 % IV SOLN
Freq: Once | INTRAVENOUS | Status: AC
  Filled 2019-07-09: qty 250

## 2019-07-09 MED ORDER — CYANOCOBALAMIN 1000 MCG/ML IJ SOLN
1000.0000 ug | Freq: Once | INTRAMUSCULAR | Status: AC
  Administered 2019-07-09: 1000 ug via INTRAMUSCULAR

## 2019-07-09 MED ORDER — SODIUM CHLORIDE 0.9 % IV SOLN
500.0000 mg/m2 | Freq: Once | INTRAVENOUS | Status: AC
  Administered 2019-07-09: 800 mg via INTRAVENOUS
  Filled 2019-07-09: qty 12

## 2019-07-09 MED ORDER — CYANOCOBALAMIN 1000 MCG/ML IJ SOLN
INTRAMUSCULAR | Status: AC
  Filled 2019-07-09: qty 1

## 2019-07-09 MED ORDER — PROCHLORPERAZINE MALEATE 10 MG PO TABS
ORAL_TABLET | ORAL | Status: AC
  Filled 2019-07-09: qty 1

## 2019-07-09 MED ORDER — SODIUM CHLORIDE 0.9 % IV SOLN
200.0000 mg | Freq: Once | INTRAVENOUS | Status: AC
  Administered 2019-07-09: 11:00:00 200 mg via INTRAVENOUS
  Filled 2019-07-09: qty 8

## 2019-07-09 NOTE — Patient Instructions (Signed)
[facility] Discharge Instructions for Patients Receiving Chemotherapy  Today you received the following chemotherapy agents: Keytruda, Alimta.  To help prevent nausea and vomiting after your treatment, we encourage you to take your nausea medication as directed.   If you develop nausea and vomiting that is not controlled by your nausea medication, call the clinic.   BELOW ARE SYMPTOMS THAT SHOULD BE REPORTED IMMEDIATELY:  *FEVER GREATER THAN 100.5 F  *CHILLS WITH OR WITHOUT FEVER  NAUSEA AND VOMITING THAT IS NOT CONTROLLED WITH YOUR NAUSEA MEDICATION  *UNUSUAL SHORTNESS OF BREATH  *UNUSUAL BRUISING OR BLEEDING  TENDERNESS IN MOUTH AND THROAT WITH OR WITHOUT PRESENCE OF ULCERS  *URINARY PROBLEMS  *BOWEL PROBLEMS  UNUSUAL RASH Items with * indicate a potential emergency and should be followed up as soon as possible.  Feel free to call the clinic should you have any questions or concerns. The clinic phone number is [phone].  Please show the [place] at check-in to the Emergency Department and triage nurse.

## 2019-07-09 NOTE — Patient Instructions (Signed)
Steps to Quit Smoking Smoking tobacco is the leading cause of preventable death. It can affect almost every organ in the body. Smoking puts you and people around you at risk for many serious, long-lasting (chronic) diseases. Quitting smoking can be hard, but it is one of the best things that you can do for your health. It is never too late to quit. How do I get ready to quit? When you decide to quit smoking, make a plan to help you succeed. Before you quit:  Pick a date to quit. Set a date within the next 2 weeks to give you time to prepare.  Write down the reasons why you are quitting. Keep this list in places where you will see it often.  Tell your family, friends, and co-workers that you are quitting. Their support is important.  Talk with your doctor about the choices that may help you quit.  Find out if your health insurance will pay for these treatments.  Know the people, places, things, and activities that make you want to smoke (triggers). Avoid them. What first steps can I take to quit smoking?  Throw away all cigarettes at home, at work, and in your car.  Throw away the things that you use when you smoke, such as ashtrays and lighters.  Clean your car. Make sure to empty the ashtray.  Clean your home, including curtains and carpets. What can I do to help me quit smoking? Talk with your doctor about taking medicines and seeing a counselor at the same time. You are more likely to succeed when you do both.  If you are pregnant or breastfeeding, talk with your doctor about counseling or other ways to quit smoking. Do not take medicine to help you quit smoking unless your doctor tells you to do so. To quit smoking: Quit right away  Quit smoking totally, instead of slowly cutting back on how much you smoke over a period of time.  Go to counseling. You are more likely to quit if you go to counseling sessions regularly. Take medicine You may take medicines to help you quit. Some  medicines need a prescription, and some you can buy over-the-counter. Some medicines may contain a drug called nicotine to replace the nicotine in cigarettes. Medicines may:  Help you to stop having the desire to smoke (cravings).  Help to stop the problems that come when you stop smoking (withdrawal symptoms). Your doctor may ask you to use:  Nicotine patches, gum, or lozenges.  Nicotine inhalers or sprays.  Non-nicotine medicine that is taken by mouth. Find resources Find resources and other ways to help you quit smoking and remain smoke-free after you quit. These resources are most helpful when you use them often. They include:  Online chats with a counselor.  Phone quitlines.  Printed self-help materials.  Support groups or group counseling.  Text messaging programs.  Mobile phone apps. Use apps on your mobile phone or tablet that can help you stick to your quit plan. There are many free apps for mobile phones and tablets as well as websites. Examples include Quit Guide from the CDC and smokefree.gov  What things can I do to make it easier to quit?   Talk to your family and friends. Ask them to support and encourage you.  Call a phone quitline (1-800-QUIT-NOW), reach out to support groups, or work with a counselor.  Ask people who smoke to not smoke around you.  Avoid places that make you want to smoke,   such as: ? Bars. ? Parties. ? Smoke-break areas at work.  Spend time with people who do not smoke.  Lower the stress in your life. Stress can make you want to smoke. Try these things to help your stress: ? Getting regular exercise. ? Doing deep-breathing exercises. ? Doing yoga. ? Meditating. ? Doing a body scan. To do this, close your eyes, focus on one area of your body at a time from head to toe. Notice which parts of your body are tense. Try to relax the muscles in those areas. How will I feel when I quit smoking? Day 1 to 3 weeks Within the first 24 hours,  you may start to have some problems that come from quitting tobacco. These problems are very bad 2-3 days after you quit, but they do not often last for more than 2-3 weeks. You may get these symptoms:  Mood swings.  Feeling restless, nervous, angry, or annoyed.  Trouble concentrating.  Dizziness.  Strong desire for high-sugar foods and nicotine.  Weight gain.  Trouble pooping (constipation).  Feeling like you may vomit (nausea).  Coughing or a sore throat.  Changes in how the medicines that you take for other issues work in your body.  Depression.  Trouble sleeping (insomnia). Week 3 and afterward After the first 2-3 weeks of quitting, you may start to notice more positive results, such as:  Better sense of smell and taste.  Less coughing and sore throat.  Slower heart rate.  Lower blood pressure.  Clearer skin.  Better breathing.  Fewer sick days. Quitting smoking can be hard. Do not give up if you fail the first time. Some people need to try a few times before they succeed. Do your best to stick to your quit plan, and talk with your doctor if you have any questions or concerns. Summary  Smoking tobacco is the leading cause of preventable death. Quitting smoking can be hard, but it is one of the best things that you can do for your health.  When you decide to quit smoking, make a plan to help you succeed.  Quit smoking right away, not slowly over a period of time.  When you start quitting, seek help from your doctor, family, or friends. This information is not intended to replace advice given to you by your health care provider. Make sure you discuss any questions you have with your health care provider. Document Revised: 02/08/2019 Document Reviewed: 08/04/2018 Elsevier Patient Education  2020 Elsevier Inc.  

## 2019-07-09 NOTE — Progress Notes (Signed)
SCr = 1.53; CrCl = 41 mL/min. Discussed w/ Dr. [name] and will continue current Alimta dose as ordered.  Kennith Center, Pharm.D., CPP 07/09/2019@10 :03 AM

## 2019-07-09 NOTE — Progress Notes (Signed)
Creatinine increase today -GFR-58. Per Dr [name] it is okay to treat pt today with [place] and alimta.
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
## 2019-07-10 ENCOUNTER — Telehealth: Payer: Self-pay | Admitting: Internal Medicine

## 2019-07-10 NOTE — Telephone Encounter (Signed)
Scheduled per los. Called and left msg. Mailed printout  °

## 2019-07-24 ENCOUNTER — Ambulatory Visit (HOSPITAL_COMMUNITY)
Admission: RE | Admit: 2019-07-24 | Discharge: 2019-07-24 | Disposition: A | Discharge status: Home / Self Care | Payer: Medicare HMO | Source: Ambulatory Visit | Attending: Internal Medicine | Admitting: Internal Medicine

## 2019-07-24 ENCOUNTER — Other Ambulatory Visit: Payer: Self-pay

## 2019-07-24 ENCOUNTER — Encounter (HOSPITAL_COMMUNITY): Payer: Self-pay

## 2019-07-24 DIAGNOSIS — C3490 Malignant neoplasm of unspecified part of unspecified bronchus or lung: Secondary | ICD-10-CM | POA: Insufficient documentation

## 2019-07-24 MED ORDER — SODIUM CHLORIDE (PF) 0.9 % IJ SOLN
INTRAMUSCULAR | Status: AC
  Filled 2019-07-24: qty 50

## 2019-07-24 MED ORDER — IOHEXOL 300 MG/ML  SOLN
75.0000 mL | Freq: Once | INTRAMUSCULAR | Status: AC | PRN
  Administered 2019-07-24: 75 mL via INTRAVENOUS

## 2019-07-26 ENCOUNTER — Telehealth: Payer: Self-pay | Admitting: Medical Oncology

## 2019-07-26 NOTE — Telephone Encounter (Signed)
"   my stomach is sore" .   Had CT c/a/p on wed. He did vomit one time after Ct scan and having normal BM.   I told him it may be related to drinking the barium and should resolve.   He did say it is better today and that he is getting hungry. I encouraged him to eat small meals to start and sips of fluid and to call back if symptom does not resolve. He voiced understanding.

## 2019-07-30 ENCOUNTER — Inpatient Hospital Stay: Payer: Medicare HMO

## 2019-07-30 ENCOUNTER — Inpatient Hospital Stay: Payer: Medicare HMO | Attending: Physician Assistant | Admitting: Internal Medicine

## 2019-07-30 ENCOUNTER — Encounter: Payer: Self-pay | Admitting: Internal Medicine

## 2019-07-30 ENCOUNTER — Telehealth: Payer: Self-pay | Admitting: Medical Oncology

## 2019-07-30 ENCOUNTER — Other Ambulatory Visit: Payer: Self-pay

## 2019-07-30 VITALS — BP 95/61 | HR 94 | Temp 99.2°F | Resp 18 | Ht 68.0 in | Wt 117.6 lb

## 2019-07-30 DIAGNOSIS — Z5111 Encounter for antineoplastic chemotherapy: Secondary | ICD-10-CM

## 2019-07-30 DIAGNOSIS — I700 Atherosclerosis of aorta: Secondary | ICD-10-CM | POA: Diagnosis not present

## 2019-07-30 DIAGNOSIS — R630 Anorexia: Secondary | ICD-10-CM | POA: Insufficient documentation

## 2019-07-30 DIAGNOSIS — Z79899 Other long term (current) drug therapy: Secondary | ICD-10-CM | POA: Diagnosis not present

## 2019-07-30 DIAGNOSIS — D509 Iron deficiency anemia, unspecified: Secondary | ICD-10-CM

## 2019-07-30 DIAGNOSIS — I252 Old myocardial infarction: Secondary | ICD-10-CM | POA: Insufficient documentation

## 2019-07-30 DIAGNOSIS — I2510 Atherosclerotic heart disease of native coronary artery without angina pectoris: Secondary | ICD-10-CM | POA: Diagnosis not present

## 2019-07-30 DIAGNOSIS — C61 Malignant neoplasm of prostate: Secondary | ICD-10-CM | POA: Diagnosis present

## 2019-07-30 DIAGNOSIS — E785 Hyperlipidemia, unspecified: Secondary | ICD-10-CM | POA: Insufficient documentation

## 2019-07-30 DIAGNOSIS — C3491 Malignant neoplasm of unspecified part of right bronchus or lung: Secondary | ICD-10-CM

## 2019-07-30 DIAGNOSIS — R634 Abnormal weight loss: Secondary | ICD-10-CM | POA: Diagnosis not present

## 2019-07-30 DIAGNOSIS — Z5112 Encounter for antineoplastic immunotherapy: Secondary | ICD-10-CM | POA: Diagnosis present

## 2019-07-30 DIAGNOSIS — C3411 Malignant neoplasm of upper lobe, right bronchus or lung: Secondary | ICD-10-CM | POA: Diagnosis not present

## 2019-07-30 DIAGNOSIS — I255 Ischemic cardiomyopathy: Secondary | ICD-10-CM | POA: Diagnosis not present

## 2019-07-30 DIAGNOSIS — R599 Enlarged lymph nodes, unspecified: Secondary | ICD-10-CM | POA: Diagnosis not present

## 2019-07-30 DIAGNOSIS — J449 Chronic obstructive pulmonary disease, unspecified: Secondary | ICD-10-CM | POA: Diagnosis not present

## 2019-07-30 DIAGNOSIS — I509 Heart failure, unspecified: Secondary | ICD-10-CM | POA: Insufficient documentation

## 2019-07-30 DIAGNOSIS — Z923 Personal history of irradiation: Secondary | ICD-10-CM | POA: Diagnosis not present

## 2019-07-30 DIAGNOSIS — R9389 Abnormal findings on diagnostic imaging of other specified body structures: Secondary | ICD-10-CM | POA: Insufficient documentation

## 2019-07-30 LAB — CMP (CANCER CENTER ONLY)
ALT: 10 U/L (ref 0–44)
AST: 12 U/L — ABNORMAL LOW (ref 15–41)
Albumin: 3.1 g/dL — ABNORMAL LOW (ref 3.5–5.0)
Alkaline Phosphatase: 52 U/L (ref 38–126)
Anion gap: 11 (ref 5–15)
BUN: 23 mg/dL — ABNORMAL HIGH (ref 6–20)
CO2: 23 mmol/L (ref 22–32)
Calcium: 9.3 mg/dL (ref 8.9–10.3)
Chloride: 98 mmol/L (ref 98–111)
Creatinine: 1.14 mg/dL (ref 0.61–1.24)
GFR, Est AFR Am: >60 mL/min (ref >60)
GFR, Estimated: >60 mL/min (ref >60)
Glucose, Bld: 115 mg/dL — ABNORMAL HIGH (ref 70–99)
Potassium: 3.7 mmol/L (ref 3.5–5.1)
Sodium: 132 mmol/L — ABNORMAL LOW (ref 135–145)
Total Bilirubin: 0.3 mg/dL (ref 0.3–1.2)
Total Protein: 8.4 g/dL — ABNORMAL HIGH (ref 6.5–8.1)

## 2019-07-30 LAB — CBC WITH DIFFERENTIAL (CANCER CENTER ONLY)
Abs Immature Granulocytes: 0.08 10*3/uL — ABNORMAL HIGH (ref 0.00–0.07)
Basophils Absolute: 0 10*3/uL (ref 0.0–0.1)
Basophils Relative: 0 %
Eosinophils Absolute: 0.2 10*3/uL (ref 0.0–0.5)
Eosinophils Relative: 3 %
HCT: 29.3 % — ABNORMAL LOW (ref 39.0–52.0)
Hemoglobin: 9.8 g/dL — ABNORMAL LOW (ref 13.0–17.0)
Immature Granulocytes: 1 %
Lymphocytes Relative: 13 %
Lymphs Abs: 1 10*3/uL (ref 0.7–4.0)
MCH: 30.1 pg (ref 26.0–34.0)
MCHC: 33.4 g/dL (ref 30.0–36.0)
MCV: 89.9 fL (ref 80.0–100.0)
Monocytes Absolute: 1 10*3/uL (ref 0.1–1.0)
Monocytes Relative: 13 %
Neutro Abs: 5.3 10*3/uL (ref 1.7–7.7)
Neutrophils Relative %: 70 %
Platelet Count: 406 10*3/uL — ABNORMAL HIGH (ref 150–400)
RBC: 3.26 MIL/uL — ABNORMAL LOW (ref 4.22–5.81)
RDW: 14.6 % (ref 11.5–15.5)
WBC Count: 7.6 10*3/uL (ref 4.0–10.5)
nRBC: 0 % (ref 0.0–0.2)

## 2019-07-30 LAB — TSH: TSH: 2.278 u[IU]/mL (ref 0.320–4.118)

## 2019-07-30 MED ORDER — SODIUM CHLORIDE 0.9 % IV SOLN
200.0000 mg | Freq: Once | INTRAVENOUS | Status: AC
  Administered 2019-07-30: 200 mg via INTRAVENOUS
  Filled 2019-07-30: qty 8

## 2019-07-30 MED ORDER — PROCHLORPERAZINE MALEATE 10 MG PO TABS
ORAL_TABLET | ORAL | Status: AC
  Filled 2019-07-30: qty 1

## 2019-07-30 MED ORDER — SODIUM CHLORIDE 0.9 % IV SOLN
500.0000 mg/m2 | Freq: Once | INTRAVENOUS | Status: AC
  Administered 2019-07-30: 800 mg via INTRAVENOUS
  Filled 2019-07-30: qty 20

## 2019-07-30 MED ORDER — PROCHLORPERAZINE MALEATE 10 MG PO TABS
10.0000 mg | ORAL_TABLET | Freq: Once | ORAL | Status: AC
  Administered 2019-07-30: 13:00:00 10 mg via ORAL

## 2019-07-30 MED ORDER — SODIUM CHLORIDE 0.9 % IV SOLN
Freq: Once | INTRAVENOUS | Status: AC
  Filled 2019-07-30: qty 250

## 2019-07-30 NOTE — Patient Instructions (Signed)
Steps to Quit Smoking Smoking tobacco is the leading cause of preventable death. It can affect almost every organ in the body. Smoking puts you and people around you at risk for many serious, long-lasting (chronic) diseases. Quitting smoking can be hard, but it is one of the best things that you can do for your health. It is never too late to quit. How do I get ready to quit? When you decide to quit smoking, make a plan to help you succeed. Before you quit:  Pick a date to quit. Set a date within the next 2 weeks to give you time to prepare.  Write down the reasons why you are quitting. Keep this list in places where you will see it often.  Tell your family, friends, and co-workers that you are quitting. Their support is important.  Talk with your doctor about the choices that may help you quit.  Find out if your health insurance will pay for these treatments.  Know the people, places, things, and activities that make you want to smoke (triggers). Avoid them. What first steps can I take to quit smoking?  Throw away all cigarettes at home, at work, and in your car.  Throw away the things that you use when you smoke, such as ashtrays and lighters.  Clean your car. Make sure to empty the ashtray.  Clean your home, including curtains and carpets. What can I do to help me quit smoking? Talk with your doctor about taking medicines and seeing a counselor at the same time. You are more likely to succeed when you do both.  If you are pregnant or breastfeeding, talk with your doctor about counseling or other ways to quit smoking. Do not take medicine to help you quit smoking unless your doctor tells you to do so. To quit smoking: Quit right away  Quit smoking totally, instead of slowly cutting back on how much you smoke over a period of time.  Go to counseling. You are more likely to quit if you go to counseling sessions regularly. Take medicine You may take medicines to help you quit. Some  medicines need a prescription, and some you can buy over-the-counter. Some medicines may contain a drug called nicotine to replace the nicotine in cigarettes. Medicines may:  Help you to stop having the desire to smoke (cravings).  Help to stop the problems that come when you stop smoking (withdrawal symptoms). Your doctor may ask you to use:  Nicotine patches, gum, or lozenges.  Nicotine inhalers or sprays.  Non-nicotine medicine that is taken by mouth. Find resources Find resources and other ways to help you quit smoking and remain smoke-free after you quit. These resources are most helpful when you use them often. They include:  Online chats with a counselor.  Phone quitlines.  Printed self-help materials.  Support groups or group counseling.  Text messaging programs.  Mobile phone apps. Use apps on your mobile phone or tablet that can help you stick to your quit plan. There are many free apps for mobile phones and tablets as well as websites. Examples include Quit Guide from the CDC and smokefree.gov  What things can I do to make it easier to quit?   Talk to your family and friends. Ask them to support and encourage you.  Call a phone quitline (1-800-QUIT-NOW), reach out to support groups, or work with a counselor.  Ask people who smoke to not smoke around you.  Avoid places that make you want to smoke,   such as: ? Bars. ? Parties. ? Smoke-break areas at work.  Spend time with people who do not smoke.  Lower the stress in your life. Stress can make you want to smoke. Try these things to help your stress: ? Getting regular exercise. ? Doing deep-breathing exercises. ? Doing yoga. ? Meditating. ? Doing a body scan. To do this, close your eyes, focus on one area of your body at a time from head to toe. Notice which parts of your body are tense. Try to relax the muscles in those areas. How will I feel when I quit smoking? Day 1 to 3 weeks Within the first 24 hours,  you may start to have some problems that come from quitting tobacco. These problems are very bad 2-3 days after you quit, but they do not often last for more than 2-3 weeks. You may get these symptoms:  Mood swings.  Feeling restless, nervous, angry, or annoyed.  Trouble concentrating.  Dizziness.  Strong desire for high-sugar foods and nicotine.  Weight gain.  Trouble pooping (constipation).  Feeling like you may vomit (nausea).  Coughing or a sore throat.  Changes in how the medicines that you take for other issues work in your body.  Depression.  Trouble sleeping (insomnia). Week 3 and afterward After the first 2-3 weeks of quitting, you may start to notice more positive results, such as:  Better sense of smell and taste.  Less coughing and sore throat.  Slower heart rate.  Lower blood pressure.  Clearer skin.  Better breathing.  Fewer sick days. Quitting smoking can be hard. Do not give up if you fail the first time. Some people need to try a few times before they succeed. Do your best to stick to your quit plan, and talk with your doctor if you have any questions or concerns. Summary  Smoking tobacco is the leading cause of preventable death. Quitting smoking can be hard, but it is one of the best things that you can do for your health.  When you decide to quit smoking, make a plan to help you succeed.  Quit smoking right away, not slowly over a period of time.  When you start quitting, seek help from your doctor, family, or friends. This information is not intended to replace advice given to you by your health care provider. Make sure you discuss any questions you have with your health care provider. Document Revised: 02/08/2019 Document Reviewed: 08/04/2018 Elsevier Patient Education  2020 Elsevier Inc.  

## 2019-07-30 NOTE — Patient Instructions (Signed)
[facility] Discharge Instructions for Patients Receiving Chemotherapy  Today you received the following chemotherapy agents: pembrolizumab and pemetrexed.  To help prevent nausea and vomiting after your treatment, we encourage you to take your nausea medication as directed.   If you develop nausea and vomiting that is not controlled by your nausea medication, call the clinic.   BELOW ARE SYMPTOMS THAT SHOULD BE REPORTED IMMEDIATELY:  *FEVER GREATER THAN 100.5 F  *CHILLS WITH OR WITHOUT FEVER  NAUSEA AND VOMITING THAT IS NOT CONTROLLED WITH YOUR NAUSEA MEDICATION  *UNUSUAL SHORTNESS OF BREATH  *UNUSUAL BRUISING OR BLEEDING  TENDERNESS IN MOUTH AND THROAT WITH OR WITHOUT PRESENCE OF ULCERS  *URINARY PROBLEMS  *BOWEL PROBLEMS  UNUSUAL RASH Items with * indicate a potential emergency and should be followed up as soon as possible.  Feel free to call the clinic should you have any questions or concerns. The clinic phone number is [phone].  Please show the [facility] at check-in to the Emergency Department and triage nurse.

## 2019-07-30 NOTE — Telephone Encounter (Signed)
Pt in the building- error in calling him.
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
## 2019-07-31 ENCOUNTER — Telehealth: Payer: Self-pay | Admitting: Medical Oncology

## 2019-07-31 NOTE — Telephone Encounter (Signed)
Ok to take it.

## 2019-07-31 NOTE — Telephone Encounter (Signed)
He has an opportunity to get the covid vaccine on Friday through his housing neighborhood.   He had treatment yesterday.  Can he take it ?

## 2019-07-31 NOTE — Telephone Encounter (Signed)
Pt.notified

## 2019-08-19 ENCOUNTER — Telehealth: Payer: Self-pay | Admitting: Medical Oncology

## 2019-08-19 NOTE — Telephone Encounter (Signed)
Lab add-on_ Pt requests PSA be drawn tomorrow with [facility]. I told pt to contact urologist and have them fax order and dx code to [phone].

## 2019-08-20 ENCOUNTER — Encounter: Payer: Self-pay | Admitting: Internal Medicine

## 2019-08-20 ENCOUNTER — Other Ambulatory Visit: Payer: Self-pay

## 2019-08-20 ENCOUNTER — Inpatient Hospital Stay (HOSPITAL_BASED_OUTPATIENT_CLINIC_OR_DEPARTMENT_OTHER): Payer: Medicare HMO | Admitting: Internal Medicine

## 2019-08-20 ENCOUNTER — Inpatient Hospital Stay: Payer: Medicare HMO

## 2019-08-20 VITALS — BP 96/71 | HR 90 | Temp 98.1°F | Resp 18 | Ht 68.0 in | Wt 122.0 lb

## 2019-08-20 DIAGNOSIS — Z5111 Encounter for antineoplastic chemotherapy: Secondary | ICD-10-CM

## 2019-08-20 DIAGNOSIS — C3491 Malignant neoplasm of unspecified part of right bronchus or lung: Secondary | ICD-10-CM

## 2019-08-20 DIAGNOSIS — Z5112 Encounter for antineoplastic immunotherapy: Secondary | ICD-10-CM | POA: Diagnosis not present

## 2019-08-20 DIAGNOSIS — C3431 Malignant neoplasm of lower lobe, right bronchus or lung: Secondary | ICD-10-CM | POA: Diagnosis not present

## 2019-08-20 LAB — CBC WITH DIFFERENTIAL (CANCER CENTER ONLY)
Abs Immature Granulocytes: 0.03 10*3/uL (ref 0.00–0.07)
Basophils Absolute: 0 10*3/uL (ref 0.0–0.1)
Basophils Relative: 1 %
Eosinophils Absolute: 0.3 10*3/uL (ref 0.0–0.5)
Eosinophils Relative: 4 %
HCT: 28.2 % — ABNORMAL LOW (ref 39.0–52.0)
Hemoglobin: 9.1 g/dL — ABNORMAL LOW (ref 13.0–17.0)
Immature Granulocytes: 1 %
Lymphocytes Relative: 21 %
Lymphs Abs: 1.4 10*3/uL (ref 0.7–4.0)
MCH: 29.4 pg (ref 26.0–34.0)
MCHC: 32.3 g/dL (ref 30.0–36.0)
MCV: 91 fL (ref 80.0–100.0)
Monocytes Absolute: 0.8 10*3/uL (ref 0.1–1.0)
Monocytes Relative: 12 %
Neutro Abs: 4 10*3/uL (ref 1.7–7.7)
Neutrophils Relative %: 61 %
Platelet Count: 458 10*3/uL — ABNORMAL HIGH (ref 150–400)
RBC: 3.1 MIL/uL — ABNORMAL LOW (ref 4.22–5.81)
RDW: 15.1 % (ref 11.5–15.5)
WBC Count: 6.5 10*3/uL (ref 4.0–10.5)
nRBC: 0 % (ref 0.0–0.2)

## 2019-08-20 LAB — CMP (CANCER CENTER ONLY)
ALT: 11 U/L (ref 0–44)
AST: 16 U/L (ref 15–41)
Albumin: 3 g/dL — ABNORMAL LOW (ref 3.5–5.0)
Alkaline Phosphatase: 59 U/L (ref 38–126)
Anion gap: 11 (ref 5–15)
BUN: 8 mg/dL (ref 6–20)
CO2: 24 mmol/L (ref 22–32)
Calcium: 9.5 mg/dL (ref 8.9–10.3)
Chloride: 103 mmol/L (ref 98–111)
Creatinine: 0.9 mg/dL (ref 0.61–1.24)
GFR, Est AFR Am: >60 mL/min (ref >60)
GFR, Estimated: >60 mL/min (ref >60)
Glucose, Bld: 107 mg/dL — ABNORMAL HIGH (ref 70–99)
Potassium: 4.4 mmol/L (ref 3.5–5.1)
Sodium: 138 mmol/L (ref 135–145)
Total Bilirubin: 0.2 mg/dL — ABNORMAL LOW (ref 0.3–1.2)
Total Protein: 8.3 g/dL — ABNORMAL HIGH (ref 6.5–8.1)

## 2019-08-20 LAB — TSH: TSH: 1.329 u[IU]/mL (ref 0.320–4.118)

## 2019-08-20 MED ORDER — SODIUM CHLORIDE 0.9 % IV SOLN
500.0000 mg/m2 | Freq: Once | INTRAVENOUS | Status: AC
  Administered 2019-08-20: 800 mg via INTRAVENOUS
  Filled 2019-08-20: qty 12

## 2019-08-20 MED ORDER — SODIUM CHLORIDE 0.9 % IV SOLN
Freq: Once | INTRAVENOUS | Status: AC
  Filled 2019-08-20: qty 250

## 2019-08-20 MED ORDER — PROCHLORPERAZINE MALEATE 10 MG PO TABS
10.0000 mg | ORAL_TABLET | Freq: Once | ORAL | Status: AC
  Administered 2019-08-20: 10 mg via ORAL

## 2019-08-20 MED ORDER — PROCHLORPERAZINE MALEATE 10 MG PO TABS
ORAL_TABLET | ORAL | Status: AC
  Filled 2019-08-20: qty 1

## 2019-08-20 MED ORDER — SODIUM CHLORIDE 0.9 % IV SOLN
200.0000 mg | Freq: Once | INTRAVENOUS | Status: AC
  Administered 2019-08-20: 200 mg via INTRAVENOUS
  Filled 2019-08-20: qty 8

## 2019-08-20 NOTE — Patient Instructions (Signed)
[facility] Discharge Instructions for Patients Receiving Chemotherapy  Today you received the following chemotherapy agents Keytruda; Alimta  To help prevent nausea and vomiting after your treatment, we encourage you to take your nausea medication as directed   If you develop nausea and vomiting that is not controlled by your nausea medication, call the clinic.   BELOW ARE SYMPTOMS THAT SHOULD BE REPORTED IMMEDIATELY:  *FEVER GREATER THAN 100.5 F  *CHILLS WITH OR WITHOUT FEVER  NAUSEA AND VOMITING THAT IS NOT CONTROLLED WITH YOUR NAUSEA MEDICATION  *UNUSUAL SHORTNESS OF BREATH  *UNUSUAL BRUISING OR BLEEDING  TENDERNESS IN MOUTH AND THROAT WITH OR WITHOUT PRESENCE OF ULCERS  *URINARY PROBLEMS  *BOWEL PROBLEMS  UNUSUAL RASH Items with * indicate a potential emergency and should be followed up as soon as possible.  Feel free to call the clinic should you have any questions or concerns. The clinic phone number is [phone].  Please show the CHEMO ALERT CARD at check-in to the Emergency Department and triage nurse.   
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
## 2019-08-20 NOTE — Patient Instructions (Signed)
Steps to Quit Smoking Smoking tobacco is the leading cause of preventable death. It can affect almost every organ in the body. Smoking puts you and people around you at risk for many serious, long-lasting (chronic) diseases. Quitting smoking can be hard, but it is one of the best things that you can do for your health. It is never too late to quit. How do I get ready to quit? When you decide to quit smoking, make a plan to help you succeed. Before you quit:  Pick a date to quit. Set a date within the next 2 weeks to give you time to prepare.  Write down the reasons why you are quitting. Keep this list in places where you will see it often.  Tell your family, friends, and co-workers that you are quitting. Their support is important.  Talk with your doctor about the choices that may help you quit.  Find out if your health insurance will pay for these treatments.  Know the people, places, things, and activities that make you want to smoke (triggers). Avoid them. What first steps can I take to quit smoking?  Throw away all cigarettes at home, at work, and in your car.  Throw away the things that you use when you smoke, such as ashtrays and lighters.  Clean your car. Make sure to empty the ashtray.  Clean your home, including curtains and carpets. What can I do to help me quit smoking? Talk with your doctor about taking medicines and seeing a counselor at the same time. You are more likely to succeed when you do both.  If you are pregnant or breastfeeding, talk with your doctor about counseling or other ways to quit smoking. Do not take medicine to help you quit smoking unless your doctor tells you to do so. To quit smoking: Quit right away  Quit smoking totally, instead of slowly cutting back on how much you smoke over a period of time.  Go to counseling. You are more likely to quit if you go to counseling sessions regularly. Take medicine You may take medicines to help you quit. Some  medicines need a prescription, and some you can buy over-the-counter. Some medicines may contain a drug called nicotine to replace the nicotine in cigarettes. Medicines may:  Help you to stop having the desire to smoke (cravings).  Help to stop the problems that come when you stop smoking (withdrawal symptoms). Your doctor may ask you to use:  Nicotine patches, gum, or lozenges.  Nicotine inhalers or sprays.  Non-nicotine medicine that is taken by mouth. Find resources Find resources and other ways to help you quit smoking and remain smoke-free after you quit. These resources are most helpful when you use them often. They include:  Online chats with a counselor.  Phone quitlines.  Printed self-help materials.  Support groups or group counseling.  Text messaging programs.  Mobile phone apps. Use apps on your mobile phone or tablet that can help you stick to your quit plan. There are many free apps for mobile phones and tablets as well as websites. Examples include Quit Guide from the CDC and smokefree.gov  What things can I do to make it easier to quit?   Talk to your family and friends. Ask them to support and encourage you.  Call a phone quitline (1-800-QUIT-NOW), reach out to support groups, or work with a counselor.  Ask people who smoke to not smoke around you.  Avoid places that make you want to smoke,   such as: ? Bars. ? Parties. ? Smoke-break areas at work.  Spend time with people who do not smoke.  Lower the stress in your life. Stress can make you want to smoke. Try these things to help your stress: ? Getting regular exercise. ? Doing deep-breathing exercises. ? Doing yoga. ? Meditating. ? Doing a body scan. To do this, close your eyes, focus on one area of your body at a time from head to toe. Notice which parts of your body are tense. Try to relax the muscles in those areas. How will I feel when I quit smoking? Day 1 to 3 weeks Within the first 24 hours,  you may start to have some problems that come from quitting tobacco. These problems are very bad 2-3 days after you quit, but they do not often last for more than 2-3 weeks. You may get these symptoms:  Mood swings.  Feeling restless, nervous, angry, or annoyed.  Trouble concentrating.  Dizziness.  Strong desire for high-sugar foods and nicotine.  Weight gain.  Trouble pooping (constipation).  Feeling like you may vomit (nausea).  Coughing or a sore throat.  Changes in how the medicines that you take for other issues work in your body.  Depression.  Trouble sleeping (insomnia). Week 3 and afterward After the first 2-3 weeks of quitting, you may start to notice more positive results, such as:  Better sense of smell and taste.  Less coughing and sore throat.  Slower heart rate.  Lower blood pressure.  Clearer skin.  Better breathing.  Fewer sick days. Quitting smoking can be hard. Do not give up if you fail the first time. Some people need to try a few times before they succeed. Do your best to stick to your quit plan, and talk with your doctor if you have any questions or concerns. Summary  Smoking tobacco is the leading cause of preventable death. Quitting smoking can be hard, but it is one of the best things that you can do for your health.  When you decide to quit smoking, make a plan to help you succeed.  Quit smoking right away, not slowly over a period of time.  When you start quitting, seek help from your doctor, family, or friends. This information is not intended to replace advice given to you by your health care provider. Make sure you discuss any questions you have with your health care provider. Document Revised: 02/08/2019 Document Reviewed: 08/04/2018 Elsevier Patient Education  2020 Elsevier Inc.  

## 2019-09-04 ENCOUNTER — Ambulatory Visit (INDEPENDENT_AMBULATORY_CARE_PROVIDER_SITE_OTHER): Payer: Medicare HMO | Admitting: *Deleted

## 2019-09-04 DIAGNOSIS — I255 Ischemic cardiomyopathy: Secondary | ICD-10-CM

## 2019-09-04 LAB — CUP PACEART REMOTE DEVICE CHECK
Battery Remaining Longevity: 48 mo
Battery Remaining Percentage: 48 %
Battery Voltage: 2.95 V
Brady Statistic RV Percent Paced: 1 %
Date Time Interrogation Session: 20210407094811
HighPow Impedance: 57 Ohm
HighPow Impedance: 57 Ohm
Implantable Lead Implant Date: 20140929
Implantable Lead Location: 753860
Implantable Lead Model: 181
Implantable Lead Serial Number: 325827
Implantable Pulse Generator Implant Date: 20140929
Lead Channel Impedance Value: 310 Ohm
Lead Channel Pacing Threshold Amplitude: 1 V
Lead Channel Pacing Threshold Pulse Width: 0.5 ms
Lead Channel Sensing Intrinsic Amplitude: 11.3 mV
Lead Channel Setting Pacing Amplitude: 2.5 V
Lead Channel Setting Pacing Pulse Width: 0.5 ms
Lead Channel Setting Sensing Sensitivity: 0.5 mV
Pulse Gen Serial Number: 7132440

## 2019-09-04 NOTE — Progress Notes (Signed)
ICD Remote  

## 2019-09-04 NOTE — Progress Notes (Signed)
Pharmacist Chemotherapy Monitoring - Follow Up Assessment    I verify that I have reviewed each item in the below checklist:  . Regimen for the patient is scheduled for the appropriate day and plan matches scheduled date. Marland Kitchen Appropriate non-routine labs are ordered dependent on drug ordered. . If applicable, additional medications reviewed and ordered per protocol based on lifetime cumulative doses and/or treatment regimen.   Plan for follow-up and/or issues identified: No . I-vent associated with next due treatment: No . MD and/or nursing notified: No  Romualdo Bolk [facility] 09/04/2019 3:20 PM

## 2019-09-10 ENCOUNTER — Inpatient Hospital Stay: Payer: Medicare HMO

## 2019-09-10 ENCOUNTER — Inpatient Hospital Stay: Payer: Medicare HMO | Admitting: Internal Medicine

## 2019-09-10 ENCOUNTER — Telehealth: Payer: Self-pay | Admitting: Internal Medicine

## 2019-09-10 ENCOUNTER — Telehealth: Payer: Self-pay | Admitting: Medical Oncology

## 2019-09-10 NOTE — Telephone Encounter (Signed)
Pt cancelled appts today . I called and his phone rang ,then ringing stopped and silence.

## 2019-09-10 NOTE — Telephone Encounter (Signed)
R/s appt per 4/13 sch message- unable to reach pt .left message with appt date and time
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
## 2019-09-11 ENCOUNTER — Inpatient Hospital Stay: Payer: Medicare HMO

## 2019-09-11 ENCOUNTER — Encounter: Payer: Self-pay | Admitting: Internal Medicine

## 2019-09-11 ENCOUNTER — Other Ambulatory Visit: Payer: Self-pay | Admitting: Internal Medicine

## 2019-09-11 ENCOUNTER — Other Ambulatory Visit: Payer: Self-pay

## 2019-09-11 ENCOUNTER — Inpatient Hospital Stay: Payer: Medicare HMO | Attending: Physician Assistant

## 2019-09-11 ENCOUNTER — Inpatient Hospital Stay (HOSPITAL_BASED_OUTPATIENT_CLINIC_OR_DEPARTMENT_OTHER): Payer: Medicare HMO | Admitting: Internal Medicine

## 2019-09-11 VITALS — BP 101/60 | HR 88 | Temp 98.2°F | Resp 18 | Ht 68.0 in | Wt 123.1 lb

## 2019-09-11 DIAGNOSIS — C61 Malignant neoplasm of prostate: Secondary | ICD-10-CM | POA: Diagnosis present

## 2019-09-11 DIAGNOSIS — C3431 Malignant neoplasm of lower lobe, right bronchus or lung: Secondary | ICD-10-CM | POA: Insufficient documentation

## 2019-09-11 DIAGNOSIS — R530 Neoplastic (malignant) related fatigue: Secondary | ICD-10-CM

## 2019-09-11 DIAGNOSIS — D649 Anemia, unspecified: Secondary | ICD-10-CM

## 2019-09-11 DIAGNOSIS — D6481 Anemia due to antineoplastic chemotherapy: Secondary | ICD-10-CM | POA: Diagnosis not present

## 2019-09-11 DIAGNOSIS — R42 Dizziness and giddiness: Secondary | ICD-10-CM | POA: Diagnosis not present

## 2019-09-11 DIAGNOSIS — Z5111 Encounter for antineoplastic chemotherapy: Secondary | ICD-10-CM | POA: Insufficient documentation

## 2019-09-11 DIAGNOSIS — I252 Old myocardial infarction: Secondary | ICD-10-CM | POA: Diagnosis not present

## 2019-09-11 DIAGNOSIS — Z79899 Other long term (current) drug therapy: Secondary | ICD-10-CM | POA: Diagnosis not present

## 2019-09-11 DIAGNOSIS — I509 Heart failure, unspecified: Secondary | ICD-10-CM | POA: Insufficient documentation

## 2019-09-11 DIAGNOSIS — J449 Chronic obstructive pulmonary disease, unspecified: Secondary | ICD-10-CM | POA: Insufficient documentation

## 2019-09-11 DIAGNOSIS — R5383 Other fatigue: Secondary | ICD-10-CM | POA: Insufficient documentation

## 2019-09-11 DIAGNOSIS — R630 Anorexia: Secondary | ICD-10-CM | POA: Diagnosis not present

## 2019-09-11 DIAGNOSIS — I2510 Atherosclerotic heart disease of native coronary artery without angina pectoris: Secondary | ICD-10-CM | POA: Diagnosis not present

## 2019-09-11 DIAGNOSIS — Z5112 Encounter for antineoplastic immunotherapy: Secondary | ICD-10-CM | POA: Diagnosis present

## 2019-09-11 DIAGNOSIS — C3490 Malignant neoplasm of unspecified part of unspecified bronchus or lung: Secondary | ICD-10-CM | POA: Diagnosis not present

## 2019-09-11 DIAGNOSIS — C3491 Malignant neoplasm of unspecified part of right bronchus or lung: Secondary | ICD-10-CM

## 2019-09-11 DIAGNOSIS — E785 Hyperlipidemia, unspecified: Secondary | ICD-10-CM | POA: Diagnosis not present

## 2019-09-11 DIAGNOSIS — I255 Ischemic cardiomyopathy: Secondary | ICD-10-CM | POA: Insufficient documentation

## 2019-09-11 LAB — CBC WITH DIFFERENTIAL (CANCER CENTER ONLY)
Abs Immature Granulocytes: 0.04 10*3/uL (ref 0.00–0.07)
Basophils Absolute: 0 10*3/uL (ref 0.0–0.1)
Basophils Relative: 1 %
Eosinophils Absolute: 0.2 10*3/uL (ref 0.0–0.5)
Eosinophils Relative: 3 %
HCT: 26.6 % — ABNORMAL LOW (ref 39.0–52.0)
Hemoglobin: 8.3 g/dL — ABNORMAL LOW (ref 13.0–17.0)
Immature Granulocytes: 1 %
Lymphocytes Relative: 23 %
Lymphs Abs: 1.6 10*3/uL (ref 0.7–4.0)
MCH: 29 pg (ref 26.0–34.0)
MCHC: 31.2 g/dL (ref 30.0–36.0)
MCV: 93 fL (ref 80.0–100.0)
Monocytes Absolute: 1.1 10*3/uL — ABNORMAL HIGH (ref 0.1–1.0)
Monocytes Relative: 15 %
Neutro Abs: 4.2 10*3/uL (ref 1.7–7.7)
Neutrophils Relative %: 57 %
Platelet Count: 545 10*3/uL — ABNORMAL HIGH (ref 150–400)
RBC: 2.86 MIL/uL — ABNORMAL LOW (ref 4.22–5.81)
RDW: 16.7 % — ABNORMAL HIGH (ref 11.5–15.5)
WBC Count: 7.2 10*3/uL (ref 4.0–10.5)
nRBC: 0 % (ref 0.0–0.2)

## 2019-09-11 LAB — CMP (CANCER CENTER ONLY)
ALT: 10 U/L (ref 0–44)
AST: 15 U/L (ref 15–41)
Albumin: 3 g/dL — ABNORMAL LOW (ref 3.5–5.0)
Alkaline Phosphatase: 56 U/L (ref 38–126)
Anion gap: 9 (ref 5–15)
BUN: 17 mg/dL (ref 6–20)
CO2: 22 mmol/L (ref 22–32)
Calcium: 9.4 mg/dL (ref 8.9–10.3)
Chloride: 103 mmol/L (ref 98–111)
Creatinine: 0.9 mg/dL (ref 0.61–1.24)
GFR, Est AFR Am: >60 mL/min (ref >60)
GFR, Estimated: >60 mL/min (ref >60)
Glucose, Bld: 87 mg/dL (ref 70–99)
Potassium: 5 mmol/L (ref 3.5–5.1)
Sodium: 134 mmol/L — ABNORMAL LOW (ref 135–145)
Total Bilirubin: <0.2 mg/dL — ABNORMAL LOW (ref 0.3–1.2)
Total Protein: 8.5 g/dL — ABNORMAL HIGH (ref 6.5–8.1)

## 2019-09-11 LAB — TSH: TSH: 1.201 u[IU]/mL (ref 0.320–4.118)

## 2019-09-11 MED ORDER — CYANOCOBALAMIN 1000 MCG/ML IJ SOLN
1000.0000 ug | Freq: Once | INTRAMUSCULAR | Status: AC
  Administered 2019-09-11: 1000 ug via INTRAMUSCULAR

## 2019-09-11 MED ORDER — SODIUM CHLORIDE 0.9 % IV SOLN
Freq: Once | INTRAVENOUS | Status: AC
  Filled 2019-09-11: qty 250

## 2019-09-11 MED ORDER — PROCHLORPERAZINE MALEATE 10 MG PO TABS
ORAL_TABLET | ORAL | Status: AC
  Filled 2019-09-11: qty 1

## 2019-09-11 MED ORDER — SODIUM CHLORIDE 0.9 % IV SOLN
500.0000 mg/m2 | Freq: Once | INTRAVENOUS | Status: AC
  Administered 2019-09-11: 800 mg via INTRAVENOUS
  Filled 2019-09-11: qty 12

## 2019-09-11 MED ORDER — SODIUM CHLORIDE 0.9 % IV SOLN
200.0000 mg | Freq: Once | INTRAVENOUS | Status: AC
  Administered 2019-09-11: 200 mg via INTRAVENOUS
  Filled 2019-09-11: qty 8

## 2019-09-11 MED ORDER — PROCHLORPERAZINE MALEATE 10 MG PO TABS
10.0000 mg | ORAL_TABLET | Freq: Once | ORAL | Status: AC
  Administered 2019-09-11: 10 mg via ORAL

## 2019-09-11 MED ORDER — CYANOCOBALAMIN 1000 MCG/ML IJ SOLN
INTRAMUSCULAR | Status: AC
  Filled 2019-09-11: qty 1

## 2019-09-11 NOTE — Patient Instructions (Signed)
[place] Discharge Instructions for Patients Receiving Chemotherapy  Today you received the following chemotherapy agents: Keytruda, Alimta  To help prevent nausea and vomiting after your treatment, we encourage you to take your nausea medication as directed.   If you develop nausea and vomiting that is not controlled by your nausea medication, call the clinic.   BELOW ARE SYMPTOMS THAT SHOULD BE REPORTED IMMEDIATELY:  *FEVER GREATER THAN 100.5 F  *CHILLS WITH OR WITHOUT FEVER  NAUSEA AND VOMITING THAT IS NOT CONTROLLED WITH YOUR NAUSEA MEDICATION  *UNUSUAL SHORTNESS OF BREATH  *UNUSUAL BRUISING OR BLEEDING  TENDERNESS IN MOUTH AND THROAT WITH OR WITHOUT PRESENCE OF ULCERS  *URINARY PROBLEMS  *BOWEL PROBLEMS  UNUSUAL RASH Items with * indicate a potential emergency and should be followed up as soon as possible.  Feel free to call the clinic should you have any questions or concerns. The clinic phone number is [phone].  Please show the [facility] at check-in to the Emergency Department and triage nurse.

## 2019-09-11 NOTE — Patient Instructions (Signed)
Steps to Quit Smoking Smoking tobacco is the leading cause of preventable death. It can affect almost every organ in the body. Smoking puts you and people around you at risk for many serious, long-lasting (chronic) diseases. Quitting smoking can be hard, but it is one of the best things that you can do for your health. It is never too late to quit. How do I get ready to quit? When you decide to quit smoking, make a plan to help you succeed. Before you quit:  Pick a date to quit. Set a date within the next 2 weeks to give you time to prepare.  Write down the reasons why you are quitting. Keep this list in places where you will see it often.  Tell your family, friends, and co-workers that you are quitting. Their support is important.  Talk with your doctor about the choices that may help you quit.  Find out if your health insurance will pay for these treatments.  Know the people, places, things, and activities that make you want to smoke (triggers). Avoid them. What first steps can I take to quit smoking?  Throw away all cigarettes at home, at work, and in your car.  Throw away the things that you use when you smoke, such as ashtrays and lighters.  Clean your car. Make sure to empty the ashtray.  Clean your home, including curtains and carpets. What can I do to help me quit smoking? Talk with your doctor about taking medicines and seeing a counselor at the same time. You are more likely to succeed when you do both.  If you are pregnant or breastfeeding, talk with your doctor about counseling or other ways to quit smoking. Do not take medicine to help you quit smoking unless your doctor tells you to do so. To quit smoking: Quit right away  Quit smoking totally, instead of slowly cutting back on how much you smoke over a period of time.  Go to counseling. You are more likely to quit if you go to counseling sessions regularly. Take medicine You may take medicines to help you quit. Some  medicines need a prescription, and some you can buy over-the-counter. Some medicines may contain a drug called nicotine to replace the nicotine in cigarettes. Medicines may:  Help you to stop having the desire to smoke (cravings).  Help to stop the problems that come when you stop smoking (withdrawal symptoms). Your doctor may ask you to use:  Nicotine patches, gum, or lozenges.  Nicotine inhalers or sprays.  Non-nicotine medicine that is taken by mouth. Find resources Find resources and other ways to help you quit smoking and remain smoke-free after you quit. These resources are most helpful when you use them often. They include:  Online chats with a counselor.  Phone quitlines.  Printed self-help materials.  Support groups or group counseling.  Text messaging programs.  Mobile phone apps. Use apps on your mobile phone or tablet that can help you stick to your quit plan. There are many free apps for mobile phones and tablets as well as websites. Examples include Quit Guide from the CDC and smokefree.gov  What things can I do to make it easier to quit?   Talk to your family and friends. Ask them to support and encourage you.  Call a phone quitline (1-800-QUIT-NOW), reach out to support groups, or work with a counselor.  Ask people who smoke to not smoke around you.  Avoid places that make you want to smoke,   such as: ? Bars. ? Parties. ? Smoke-break areas at work.  Spend time with people who do not smoke.  Lower the stress in your life. Stress can make you want to smoke. Try these things to help your stress: ? Getting regular exercise. ? Doing deep-breathing exercises. ? Doing yoga. ? Meditating. ? Doing a body scan. To do this, close your eyes, focus on one area of your body at a time from head to toe. Notice which parts of your body are tense. Try to relax the muscles in those areas. How will I feel when I quit smoking? Day 1 to 3 weeks Within the first 24 hours,  you may start to have some problems that come from quitting tobacco. These problems are very bad 2-3 days after you quit, but they do not often last for more than 2-3 weeks. You may get these symptoms:  Mood swings.  Feeling restless, nervous, angry, or annoyed.  Trouble concentrating.  Dizziness.  Strong desire for high-sugar foods and nicotine.  Weight gain.  Trouble pooping (constipation).  Feeling like you may vomit (nausea).  Coughing or a sore throat.  Changes in how the medicines that you take for other issues work in your body.  Depression.  Trouble sleeping (insomnia). Week 3 and afterward After the first 2-3 weeks of quitting, you may start to notice more positive results, such as:  Better sense of smell and taste.  Less coughing and sore throat.  Slower heart rate.  Lower blood pressure.  Clearer skin.  Better breathing.  Fewer sick days. Quitting smoking can be hard. Do not give up if you fail the first time. Some people need to try a few times before they succeed. Do your best to stick to your quit plan, and talk with your doctor if you have any questions or concerns. Summary  Smoking tobacco is the leading cause of preventable death. Quitting smoking can be hard, but it is one of the best things that you can do for your health.  When you decide to quit smoking, make a plan to help you succeed.  Quit smoking right away, not slowly over a period of time.  When you start quitting, seek help from your doctor, family, or friends. This information is not intended to replace advice given to you by your health care provider. Make sure you discuss any questions you have with your health care provider. Document Revised: 02/08/2019 Document Reviewed: 08/04/2018 Elsevier Patient Education  2020 Elsevier Inc.  
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
## 2019-09-12 ENCOUNTER — Other Ambulatory Visit: Payer: Self-pay | Admitting: Internal Medicine

## 2019-09-25 NOTE — Progress Notes (Signed)
Pharmacist Chemotherapy Monitoring - Follow Up Assessment    I verify that I have reviewed each item in the below checklist:  . Regimen for the patient is scheduled for the appropriate day and plan matches scheduled date. Marland Kitchen Appropriate non-routine labs are ordered dependent on drug ordered. . If applicable, additional medications reviewed and ordered per protocol based on lifetime cumulative doses and/or treatment regimen.   Plan for follow-up and/or issues identified: No . I-vent associated with next due treatment: No . MD and/or nursing notified: No  [name] 09/25/2019 12:59 PM

## 2019-10-01 ENCOUNTER — Ambulatory Visit: Payer: Medicare HMO | Admitting: Internal Medicine

## 2019-10-01 ENCOUNTER — Other Ambulatory Visit: Payer: Medicare HMO

## 2019-10-01 ENCOUNTER — Ambulatory Visit: Payer: Medicare HMO

## 2019-10-01 ENCOUNTER — Ambulatory Visit (HOSPITAL_COMMUNITY)
Admission: RE | Admit: 2019-10-01 | Discharge: 2019-10-01 | Disposition: A | Discharge status: Home / Self Care | Payer: Medicare HMO | Source: Ambulatory Visit | Attending: Internal Medicine | Admitting: Internal Medicine

## 2019-10-01 ENCOUNTER — Other Ambulatory Visit: Payer: Self-pay

## 2019-10-01 DIAGNOSIS — C3490 Malignant neoplasm of unspecified part of unspecified bronchus or lung: Secondary | ICD-10-CM

## 2019-10-01 MED ORDER — IOHEXOL 300 MG/ML  SOLN
100.0000 mL | Freq: Once | INTRAMUSCULAR | Status: AC | PRN
  Administered 2019-10-01: 100 mL via INTRAVENOUS

## 2019-10-02 ENCOUNTER — Telehealth: Payer: Self-pay | Admitting: Medical Oncology

## 2019-10-02 ENCOUNTER — Inpatient Hospital Stay: Payer: Medicare HMO

## 2019-10-02 ENCOUNTER — Telehealth: Payer: Self-pay | Admitting: Internal Medicine

## 2019-10-02 ENCOUNTER — Inpatient Hospital Stay: Payer: Medicare HMO | Admitting: Internal Medicine

## 2019-10-02 NOTE — Telephone Encounter (Signed)
"  I missed my appt today , I have so many appts I cannot keep up". Schedule message sent .

## 2019-10-02 NOTE — Telephone Encounter (Signed)
Scheduled appt per 5/5 sch message - pt aware of appt date and time

## 2019-10-03 ENCOUNTER — Other Ambulatory Visit: Payer: Self-pay | Admitting: Medical Oncology

## 2019-10-03 ENCOUNTER — Other Ambulatory Visit: Payer: Self-pay

## 2019-10-03 ENCOUNTER — Inpatient Hospital Stay: Payer: Medicare HMO

## 2019-10-03 ENCOUNTER — Inpatient Hospital Stay: Payer: Medicare HMO | Attending: Physician Assistant | Admitting: Internal Medicine

## 2019-10-03 ENCOUNTER — Encounter: Payer: Self-pay | Admitting: Internal Medicine

## 2019-10-03 VITALS — BP 116/63 | HR 95 | Temp 98.9°F | Resp 17 | Ht 68.0 in | Wt 127.2 lb

## 2019-10-03 DIAGNOSIS — D649 Anemia, unspecified: Secondary | ICD-10-CM

## 2019-10-03 DIAGNOSIS — C3491 Malignant neoplasm of unspecified part of right bronchus or lung: Secondary | ICD-10-CM

## 2019-10-03 DIAGNOSIS — I700 Atherosclerosis of aorta: Secondary | ICD-10-CM | POA: Insufficient documentation

## 2019-10-03 DIAGNOSIS — R630 Anorexia: Secondary | ICD-10-CM | POA: Diagnosis not present

## 2019-10-03 DIAGNOSIS — J449 Chronic obstructive pulmonary disease, unspecified: Secondary | ICD-10-CM | POA: Diagnosis not present

## 2019-10-03 DIAGNOSIS — I252 Old myocardial infarction: Secondary | ICD-10-CM | POA: Diagnosis not present

## 2019-10-03 DIAGNOSIS — R188 Other ascites: Secondary | ICD-10-CM | POA: Diagnosis not present

## 2019-10-03 DIAGNOSIS — C61 Malignant neoplasm of prostate: Secondary | ICD-10-CM | POA: Insufficient documentation

## 2019-10-03 DIAGNOSIS — R5383 Other fatigue: Secondary | ICD-10-CM | POA: Diagnosis not present

## 2019-10-03 DIAGNOSIS — I2510 Atherosclerotic heart disease of native coronary artery without angina pectoris: Secondary | ICD-10-CM | POA: Diagnosis not present

## 2019-10-03 DIAGNOSIS — M5137 Other intervertebral disc degeneration, lumbosacral region: Secondary | ICD-10-CM | POA: Insufficient documentation

## 2019-10-03 DIAGNOSIS — R0609 Other forms of dyspnea: Secondary | ICD-10-CM | POA: Insufficient documentation

## 2019-10-03 DIAGNOSIS — C3431 Malignant neoplasm of lower lobe, right bronchus or lung: Secondary | ICD-10-CM | POA: Diagnosis not present

## 2019-10-03 DIAGNOSIS — Z5111 Encounter for antineoplastic chemotherapy: Secondary | ICD-10-CM | POA: Diagnosis present

## 2019-10-03 DIAGNOSIS — Z5112 Encounter for antineoplastic immunotherapy: Secondary | ICD-10-CM | POA: Diagnosis present

## 2019-10-03 DIAGNOSIS — I255 Ischemic cardiomyopathy: Secondary | ICD-10-CM | POA: Insufficient documentation

## 2019-10-03 DIAGNOSIS — Z79899 Other long term (current) drug therapy: Secondary | ICD-10-CM | POA: Diagnosis not present

## 2019-10-03 DIAGNOSIS — R590 Localized enlarged lymph nodes: Secondary | ICD-10-CM | POA: Diagnosis not present

## 2019-10-03 DIAGNOSIS — I509 Heart failure, unspecified: Secondary | ICD-10-CM | POA: Diagnosis not present

## 2019-10-03 LAB — CBC WITH DIFFERENTIAL (CANCER CENTER ONLY)
Abs Immature Granulocytes: 0.02 10*3/uL (ref 0.00–0.07)
Basophils Absolute: 0 10*3/uL (ref 0.0–0.1)
Basophils Relative: 0 %
Eosinophils Absolute: 0.4 10*3/uL (ref 0.0–0.5)
Eosinophils Relative: 5 %
HCT: 23.8 % — ABNORMAL LOW (ref 39.0–52.0)
Hemoglobin: 7.8 g/dL — ABNORMAL LOW (ref 13.0–17.0)
Immature Granulocytes: 0 %
Lymphocytes Relative: 25 %
Lymphs Abs: 1.9 10*3/uL (ref 0.7–4.0)
MCH: 29.8 pg (ref 26.0–34.0)
MCHC: 32.8 g/dL (ref 30.0–36.0)
MCV: 90.8 fL (ref 80.0–100.0)
Monocytes Absolute: 0.9 10*3/uL (ref 0.1–1.0)
Monocytes Relative: 11 %
Neutro Abs: 4.5 10*3/uL (ref 1.7–7.7)
Neutrophils Relative %: 59 %
Platelet Count: 396 10*3/uL (ref 150–400)
RBC: 2.62 MIL/uL — ABNORMAL LOW (ref 4.22–5.81)
RDW: 17.2 % — ABNORMAL HIGH (ref 11.5–15.5)
WBC Count: 7.7 10*3/uL (ref 4.0–10.5)
nRBC: 0 % (ref 0.0–0.2)

## 2019-10-03 LAB — CMP (CANCER CENTER ONLY)
ALT: 10 U/L (ref 0–44)
AST: 19 U/L (ref 15–41)
Albumin: 3.2 g/dL — ABNORMAL LOW (ref 3.5–5.0)
Alkaline Phosphatase: 55 U/L (ref 38–126)
Anion gap: 9 (ref 5–15)
BUN: 16 mg/dL (ref 6–20)
CO2: 26 mmol/L (ref 22–32)
Calcium: 9.4 mg/dL (ref 8.9–10.3)
Chloride: 98 mmol/L (ref 98–111)
Creatinine: 1.21 mg/dL (ref 0.61–1.24)
GFR, Est AFR Am: >60 mL/min (ref >60)
GFR, Estimated: >60 mL/min (ref >60)
Glucose, Bld: 91 mg/dL (ref 70–99)
Potassium: 4.4 mmol/L (ref 3.5–5.1)
Sodium: 133 mmol/L — ABNORMAL LOW (ref 135–145)
Total Bilirubin: 0.2 mg/dL — ABNORMAL LOW (ref 0.3–1.2)
Total Protein: 8.6 g/dL — ABNORMAL HIGH (ref 6.5–8.1)

## 2019-10-03 LAB — ABO/RH: ABO/RH(D): O POS

## 2019-10-03 LAB — PREPARE RBC (CROSSMATCH)

## 2019-10-03 LAB — TSH: TSH: 0.501 u[IU]/mL (ref 0.320–4.118)

## 2019-10-03 MED ORDER — SODIUM CHLORIDE 0.9 % IV SOLN
500.0000 mg/m2 | Freq: Once | INTRAVENOUS | Status: AC
  Administered 2019-10-03: 800 mg via INTRAVENOUS
  Filled 2019-10-03: qty 20

## 2019-10-03 MED ORDER — SODIUM CHLORIDE 0.9 % IV SOLN
200.0000 mg | Freq: Once | INTRAVENOUS | Status: AC
  Administered 2019-10-03: 200 mg via INTRAVENOUS
  Filled 2019-10-03: qty 8

## 2019-10-03 MED ORDER — PROCHLORPERAZINE MALEATE 10 MG PO TABS
10.0000 mg | ORAL_TABLET | Freq: Once | ORAL | Status: AC
  Administered 2019-10-03: 10 mg via ORAL

## 2019-10-03 MED ORDER — SODIUM CHLORIDE 0.9 % IV SOLN
Freq: Once | INTRAVENOUS | Status: AC
  Filled 2019-10-03: qty 250

## 2019-10-03 MED ORDER — PROCHLORPERAZINE MALEATE 10 MG PO TABS
ORAL_TABLET | ORAL | Status: AC
  Filled 2019-10-03: qty 1

## 2019-10-03 NOTE — Patient Instructions (Signed)
Steps to Quit Smoking Smoking tobacco is the leading cause of preventable death. It can affect almost every organ in the body. Smoking puts you and people around you at risk for many serious, long-lasting (chronic) diseases. Quitting smoking can be hard, but it is one of the best things that you can do for your health. It is never too late to quit. How do I get ready to quit? When you decide to quit smoking, make a plan to help you succeed. Before you quit:  Pick a date to quit. Set a date within the next 2 weeks to give you time to prepare.  Write down the reasons why you are quitting. Keep this list in places where you will see it often.  Tell your family, friends, and co-workers that you are quitting. Their support is important.  Talk with your doctor about the choices that may help you quit.  Find out if your health insurance will pay for these treatments.  Know the people, places, things, and activities that make you want to smoke (triggers). Avoid them. What first steps can I take to quit smoking?  Throw away all cigarettes at home, at work, and in your car.  Throw away the things that you use when you smoke, such as ashtrays and lighters.  Clean your car. Make sure to empty the ashtray.  Clean your home, including curtains and carpets. What can I do to help me quit smoking? Talk with your doctor about taking medicines and seeing a counselor at the same time. You are more likely to succeed when you do both.  If you are pregnant or breastfeeding, talk with your doctor about counseling or other ways to quit smoking. Do not take medicine to help you quit smoking unless your doctor tells you to do so. To quit smoking: Quit right away  Quit smoking totally, instead of slowly cutting back on how much you smoke over a period of time.  Go to counseling. You are more likely to quit if you go to counseling sessions regularly. Take medicine You may take medicines to help you quit. Some  medicines need a prescription, and some you can buy over-the-counter. Some medicines may contain a drug called nicotine to replace the nicotine in cigarettes. Medicines may:  Help you to stop having the desire to smoke (cravings).  Help to stop the problems that come when you stop smoking (withdrawal symptoms). Your doctor may ask you to use:  Nicotine patches, gum, or lozenges.  Nicotine inhalers or sprays.  Non-nicotine medicine that is taken by mouth. Find resources Find resources and other ways to help you quit smoking and remain smoke-free after you quit. These resources are most helpful when you use them often. They include:  Online chats with a counselor.  Phone quitlines.  Printed self-help materials.  Support groups or group counseling.  Text messaging programs.  Mobile phone apps. Use apps on your mobile phone or tablet that can help you stick to your quit plan. There are many free apps for mobile phones and tablets as well as websites. Examples include Quit Guide from the CDC and smokefree.gov  What things can I do to make it easier to quit?   Talk to your family and friends. Ask them to support and encourage you.  Call a phone quitline (1-800-QUIT-NOW), reach out to support groups, or work with a counselor.  Ask people who smoke to not smoke around you.  Avoid places that make you want to smoke,   such as: ? Bars. ? Parties. ? Smoke-break areas at work.  Spend time with people who do not smoke.  Lower the stress in your life. Stress can make you want to smoke. Try these things to help your stress: ? Getting regular exercise. ? Doing deep-breathing exercises. ? Doing yoga. ? Meditating. ? Doing a body scan. To do this, close your eyes, focus on one area of your body at a time from head to toe. Notice which parts of your body are tense. Try to relax the muscles in those areas. How will I feel when I quit smoking? Day 1 to 3 weeks Within the first 24 hours,  you may start to have some problems that come from quitting tobacco. These problems are very bad 2-3 days after you quit, but they do not often last for more than 2-3 weeks. You may get these symptoms:  Mood swings.  Feeling restless, nervous, angry, or annoyed.  Trouble concentrating.  Dizziness.  Strong desire for high-sugar foods and nicotine.  Weight gain.  Trouble pooping (constipation).  Feeling like you may vomit (nausea).  Coughing or a sore throat.  Changes in how the medicines that you take for other issues work in your body.  Depression.  Trouble sleeping (insomnia). Week 3 and afterward After the first 2-3 weeks of quitting, you may start to notice more positive results, such as:  Better sense of smell and taste.  Less coughing and sore throat.  Slower heart rate.  Lower blood pressure.  Clearer skin.  Better breathing.  Fewer sick days. Quitting smoking can be hard. Do not give up if you fail the first time. Some people need to try a few times before they succeed. Do your best to stick to your quit plan, and talk with your doctor if you have any questions or concerns. Summary  Smoking tobacco is the leading cause of preventable death. Quitting smoking can be hard, but it is one of the best things that you can do for your health.  When you decide to quit smoking, make a plan to help you succeed.  Quit smoking right away, not slowly over a period of time.  When you start quitting, seek help from your doctor, family, or friends. This information is not intended to replace advice given to you by your health care provider. Make sure you discuss any questions you have with your health care provider. Document Revised: 02/08/2019 Document Reviewed: 08/04/2018 Elsevier Patient Education  2020 Elsevier Inc.  
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
## 2019-10-03 NOTE — Progress Notes (Signed)
Per Dr. Worthy Flank office visit note, okay to continue with treatment today.

## 2019-10-03 NOTE — Patient Instructions (Signed)
[facility] Discharge Instructions for Patients Receiving Chemotherapy  Today you received the following chemotherapy agents: Keytruda, Alimta.  To help prevent nausea and vomiting after your treatment, we encourage you to take your nausea medication as directed.   If you develop nausea and vomiting that is not controlled by your nausea medication, call the clinic.   BELOW ARE SYMPTOMS THAT SHOULD BE REPORTED IMMEDIATELY:  *FEVER GREATER THAN 100.5 F  *CHILLS WITH OR WITHOUT FEVER  NAUSEA AND VOMITING THAT IS NOT CONTROLLED WITH YOUR NAUSEA MEDICATION  *UNUSUAL SHORTNESS OF BREATH  *UNUSUAL BRUISING OR BLEEDING  TENDERNESS IN MOUTH AND THROAT WITH OR WITHOUT PRESENCE OF ULCERS  *URINARY PROBLEMS  *BOWEL PROBLEMS  UNUSUAL RASH Items with * indicate a potential emergency and should be followed up as soon as possible.  Feel free to call the clinic should you have any questions or concerns. The clinic phone number is [phone].  Please show the [facility] at check-in to the Emergency Department and triage nurse.

## 2019-10-04 ENCOUNTER — Other Ambulatory Visit: Payer: Self-pay | Admitting: Medical

## 2019-10-04 ENCOUNTER — Ambulatory Visit: Payer: Medicare HMO

## 2019-10-04 DIAGNOSIS — D649 Anemia, unspecified: Secondary | ICD-10-CM

## 2019-10-05 ENCOUNTER — Inpatient Hospital Stay: Payer: Medicare HMO

## 2019-10-05 ENCOUNTER — Other Ambulatory Visit: Payer: Self-pay

## 2019-10-05 VITALS — BP 116/64 | HR 88 | Temp 98.4°F | Resp 17

## 2019-10-05 DIAGNOSIS — Z5112 Encounter for antineoplastic immunotherapy: Secondary | ICD-10-CM | POA: Diagnosis not present

## 2019-10-05 DIAGNOSIS — D649 Anemia, unspecified: Secondary | ICD-10-CM

## 2019-10-05 LAB — TYPE AND SCREEN
ABO/RH(D): O POS
Antibody Screen: NEGATIVE
Unit division: 0

## 2019-10-05 LAB — PREPARE RBC (CROSSMATCH)

## 2019-10-05 LAB — BPAM RBC
Blood Product Expiration Date: 202106072359
Unit Type and Rh: 5100

## 2019-10-05 MED ORDER — ACETAMINOPHEN 325 MG PO TABS
650.0000 mg | ORAL_TABLET | Freq: Once | ORAL | Status: AC
  Administered 2019-10-05: 10:00:00 650 mg via ORAL

## 2019-10-05 MED ORDER — SODIUM CHLORIDE 0.9% IV SOLUTION
250.0000 mL | Freq: Once | INTRAVENOUS | Status: AC
  Administered 2019-10-05: 10:00:00 250 mL via INTRAVENOUS
  Filled 2019-10-05: qty 250

## 2019-10-05 MED ORDER — DIPHENHYDRAMINE HCL 25 MG PO CAPS
25.0000 mg | ORAL_CAPSULE | Freq: Once | ORAL | Status: AC
  Administered 2019-10-05: 25 mg via ORAL

## 2019-10-05 MED ORDER — ACETAMINOPHEN 325 MG PO TABS
ORAL_TABLET | ORAL | Status: AC
  Filled 2019-10-05: qty 2

## 2019-10-05 MED ORDER — DIPHENHYDRAMINE HCL 25 MG PO CAPS
ORAL_CAPSULE | ORAL | Status: AC
  Filled 2019-10-05: qty 1

## 2019-10-05 NOTE — Patient Instructions (Signed)
https://www.[facility].org/donate-blood/blood-donation-process/what-happens-to-donated-blood/blood-transfusions/types-of-blood-transfusions.html"> https://www.[facility].org/donate-blood/blood-donation-process/what-happens-to-donated-blood/blood-transfusions/risks-complications.html">  Blood Transfusion, Adult, Care After This sheet gives you information about how to care for yourself after your procedure. Your health care provider may also give you more specific instructions. If you have problems or questions, contact your health care provider. What can I expect after the procedure? After the procedure, it is common to have:  Bruising and soreness where the IV was inserted.  A fever or chills on the day of the procedure. This may be your body's response to the new blood cells received.  A headache. Follow these instructions at home: IV insertion site care      Follow instructions from your health care provider about how to take care of your IV insertion site. Make sure you: ? Wash your hands with soap and water before and after you change your bandage (dressing). If soap and water are not available, use hand sanitizer. ? Change your dressing as told by your health care provider.  Check your IV insertion site every day for signs of infection. Check for: ? Redness, swelling, or pain. ? Bleeding from the site. ? Warmth. ? Pus or a bad smell. General instructions  Take over-the-counter and prescription medicines only as told by your health care provider.  Rest as told by your health care provider.  Return to your normal activities as told by your health care provider.  Keep all follow-up visits as told by your health care provider. This is important. Contact a health care provider if:  You have itching or red, swollen areas of skin (hives).  You feel anxious.  You feel weak after doing your normal activities.  You have redness, swelling, warmth, or pain around the IV  insertion site.  You have blood coming from the IV insertion site that does not stop with pressure.  You have pus or a bad smell coming from your IV insertion site. Get help right away if:  You have symptoms of a serious allergic or immune system reaction, including: ? Trouble breathing or shortness of breath. ? Swelling of the face or feeling flushed. ? Fever or chills. ? Pain in the head, back, or chest. ? Dark urine or blood in the urine. ? Widespread rash. ? Fast heartbeat. ? Feeling dizzy or light-headed. If you receive your blood transfusion in an outpatient setting, you will be told whom to contact to report any reactions. These symptoms may represent a serious problem that is an emergency. Do not wait to see if the symptoms will go away. Get medical help right away. Call your local emergency services (911 in the [place] Do not drive yourself to the hospital. Summary  Bruising and tenderness around the IV insertion site are common.  Check your IV insertion site every day for signs of infection.  Rest as told by your health care provider. Return to your normal activities as told by your health care provider.  Get help right away for symptoms of a serious allergic or immune system reaction to blood transfusion. This information is not intended to replace advice given to you by your health care provider. Make sure you discuss any questions you have with your health care provider. Document Revised: 11/08/2018 Document Reviewed: 11/08/2018 Elsevier Patient Education  2020 [employer]  

## 2019-10-06 LAB — TYPE AND SCREEN
ABO/RH(D): O POS
Antibody Screen: NEGATIVE
Unit division: 0

## 2019-10-06 LAB — BPAM RBC
Blood Product Expiration Date: 202106092359
ISSUE DATE / TIME: 202105081017
Unit Type and Rh: 5100

## 2019-10-07 ENCOUNTER — Other Ambulatory Visit: Payer: Self-pay | Admitting: Medical Oncology

## 2019-10-07 DIAGNOSIS — I878 Other specified disorders of veins: Secondary | ICD-10-CM

## 2019-10-08 ENCOUNTER — Telehealth: Payer: Self-pay | Admitting: Internal Medicine

## 2019-10-08 NOTE — Telephone Encounter (Signed)
Scheduled per los. Called and left msg. Mailed printout  °

## 2019-10-16 NOTE — Progress Notes (Signed)
Pharmacist Chemotherapy Monitoring - Follow Up Assessment    I verify that I have reviewed each item in the below checklist:  . Regimen for the patient is scheduled for the appropriate day and plan matches scheduled date. Marland Kitchen Appropriate non-routine labs are ordered dependent on drug ordered. . If applicable, additional medications reviewed and ordered per protocol based on lifetime cumulative doses and/or treatment regimen.   Plan for follow-up and/or issues identified: Yes . I-vent associated with next due treatment: Yes . MD and/or nursing notified: No  [name] 10/16/2019 1:19 PM

## 2019-10-20 ENCOUNTER — Other Ambulatory Visit: Payer: Self-pay | Admitting: Internal Medicine
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
## 2019-10-22 ENCOUNTER — Inpatient Hospital Stay: Payer: Medicare HMO

## 2019-10-22 ENCOUNTER — Inpatient Hospital Stay (HOSPITAL_BASED_OUTPATIENT_CLINIC_OR_DEPARTMENT_OTHER): Payer: Medicare HMO | Admitting: Physician Assistant

## 2019-10-22 ENCOUNTER — Other Ambulatory Visit: Payer: Self-pay

## 2019-10-22 ENCOUNTER — Other Ambulatory Visit: Payer: Self-pay | Admitting: Physician Assistant

## 2019-10-22 VITALS — BP 115/72 | HR 84 | Temp 97.7°F | Resp 18 | Ht 68.0 in | Wt 128.0 lb

## 2019-10-22 DIAGNOSIS — C3491 Malignant neoplasm of unspecified part of right bronchus or lung: Secondary | ICD-10-CM

## 2019-10-22 DIAGNOSIS — R918 Other nonspecific abnormal finding of lung field: Secondary | ICD-10-CM

## 2019-10-22 DIAGNOSIS — Z5112 Encounter for antineoplastic immunotherapy: Secondary | ICD-10-CM | POA: Diagnosis not present

## 2019-10-22 LAB — CMP (CANCER CENTER ONLY)
ALT: 12 U/L (ref 0–44)
AST: 21 U/L (ref 15–41)
Albumin: 2.9 g/dL — ABNORMAL LOW (ref 3.5–5.0)
Alkaline Phosphatase: 55 U/L (ref 38–126)
Anion gap: 5 (ref 5–15)
BUN: 14 mg/dL (ref 6–20)
CO2: 25 mmol/L (ref 22–32)
Calcium: 9.2 mg/dL (ref 8.9–10.3)
Chloride: 98 mmol/L (ref 98–111)
Creatinine: 0.92 mg/dL (ref 0.61–1.24)
GFR, Est AFR Am: >60 mL/min (ref >60)
GFR, Estimated: >60 mL/min (ref >60)
Glucose, Bld: 96 mg/dL (ref 70–99)
Potassium: 4.5 mmol/L (ref 3.5–5.1)
Sodium: 128 mmol/L — ABNORMAL LOW (ref 135–145)
Total Bilirubin: 0.2 mg/dL — ABNORMAL LOW (ref 0.3–1.2)
Total Protein: 8.2 g/dL — ABNORMAL HIGH (ref 6.5–8.1)

## 2019-10-22 LAB — CBC WITH DIFFERENTIAL (CANCER CENTER ONLY)
Abs Immature Granulocytes: 0.04 10*3/uL (ref 0.00–0.07)
Basophils Absolute: 0 10*3/uL (ref 0.0–0.1)
Basophils Relative: 0 %
Eosinophils Absolute: 0.5 10*3/uL (ref 0.0–0.5)
Eosinophils Relative: 5 %
HCT: 27.8 % — ABNORMAL LOW (ref 39.0–52.0)
Hemoglobin: 8.9 g/dL — ABNORMAL LOW (ref 13.0–17.0)
Immature Granulocytes: 0 %
Lymphocytes Relative: 18 %
Lymphs Abs: 1.7 10*3/uL (ref 0.7–4.0)
MCH: 28.4 pg (ref 26.0–34.0)
MCHC: 32 g/dL (ref 30.0–36.0)
MCV: 88.8 fL (ref 80.0–100.0)
Monocytes Absolute: 1 10*3/uL (ref 0.1–1.0)
Monocytes Relative: 11 %
Neutro Abs: 6.1 10*3/uL (ref 1.7–7.7)
Neutrophils Relative %: 66 %
Platelet Count: 447 10*3/uL — ABNORMAL HIGH (ref 150–400)
RBC: 3.13 MIL/uL — ABNORMAL LOW (ref 4.22–5.81)
RDW: 17.2 % — ABNORMAL HIGH (ref 11.5–15.5)
WBC Count: 9.4 10*3/uL (ref 4.0–10.5)
nRBC: 0 % (ref 0.0–0.2)

## 2019-10-22 LAB — TSH: TSH: 1.168 u[IU]/mL (ref 0.320–4.118)

## 2019-10-22 MED ORDER — PROCHLORPERAZINE MALEATE 10 MG PO TABS
10.0000 mg | ORAL_TABLET | Freq: Once | ORAL | Status: AC
  Administered 2019-10-22: 10 mg via ORAL

## 2019-10-22 MED ORDER — FOLIC ACID 1 MG PO TABS: 1.0000 mg | ORAL_TABLET | Freq: Every day | ORAL | 2 refills | Status: DC

## 2019-10-22 MED ORDER — PROCHLORPERAZINE MALEATE 10 MG PO TABS
ORAL_TABLET | ORAL | Status: AC
  Filled 2019-10-22: qty 1

## 2019-10-22 MED ORDER — SODIUM CHLORIDE 0.9 % IV SOLN
200.0000 mg | Freq: Once | INTRAVENOUS | Status: AC
  Administered 2019-10-22: 200 mg via INTRAVENOUS
  Filled 2019-10-22: qty 8

## 2019-10-22 MED ORDER — LIDOCAINE-PRILOCAINE 2.5-2.5 % EX CREA: 1.0000 "application " | TOPICAL_CREAM | CUTANEOUS | 2 refills | Status: DC | PRN

## 2019-10-22 MED ORDER — SODIUM CHLORIDE 0.9 % IV SOLN
500.0000 mg/m2 | Freq: Once | INTRAVENOUS | Status: AC
  Administered 2019-10-22: 800 mg via INTRAVENOUS
  Filled 2019-10-22: qty 20

## 2019-10-22 MED ORDER — SODIUM CHLORIDE 0.9 % IV SOLN
Freq: Once | INTRAVENOUS | Status: AC
  Filled 2019-10-22: qty 250

## 2019-10-22 NOTE — Patient Instructions (Signed)
[facility] Discharge Instructions for Patients Receiving Chemotherapy  Today you received the following chemotherapy agents: Keytruda, Alimta.  To help prevent nausea and vomiting after your treatment, we encourage you to take your nausea medication as directed.   If you develop nausea and vomiting that is not controlled by your nausea medication, call the clinic.   BELOW ARE SYMPTOMS THAT SHOULD BE REPORTED IMMEDIATELY:  *FEVER GREATER THAN 100.5 F  *CHILLS WITH OR WITHOUT FEVER  NAUSEA AND VOMITING THAT IS NOT CONTROLLED WITH YOUR NAUSEA MEDICATION  *UNUSUAL SHORTNESS OF BREATH  *UNUSUAL BRUISING OR BLEEDING  TENDERNESS IN MOUTH AND THROAT WITH OR WITHOUT PRESENCE OF ULCERS  *URINARY PROBLEMS  *BOWEL PROBLEMS  UNUSUAL RASH Items with * indicate a potential emergency and should be followed up as soon as possible.  Feel free to call the clinic should you have any questions or concerns. The clinic phone number is [phone].  Please show the [facility] at check-in to the Emergency Department and triage nurse.

## 2019-10-23 ENCOUNTER — Other Ambulatory Visit (HOSPITAL_COMMUNITY): Payer: Medicare HMO

## 2019-10-23 ENCOUNTER — Ambulatory Visit (HOSPITAL_COMMUNITY): Payer: Medicare HMO

## 2019-10-29 ENCOUNTER — Telehealth: Payer: Self-pay | Admitting: Medical Oncology

## 2019-10-29 ENCOUNTER — Other Ambulatory Visit: Payer: Self-pay | Admitting: Radiology

## 2019-10-29 NOTE — Telephone Encounter (Signed)
Pt cancelled his port a cath procedure appt for today.

## 2019-10-30 ENCOUNTER — Inpatient Hospital Stay (HOSPITAL_COMMUNITY): Admission: RE | Admit: 2019-10-30 | Payer: Medicare HMO | Source: Ambulatory Visit

## 2019-10-30 ENCOUNTER — Ambulatory Visit (HOSPITAL_COMMUNITY): Payer: Medicare HMO

## 2019-11-04 ENCOUNTER — Other Ambulatory Visit: Payer: Self-pay | Admitting: Radiology

## 2019-11-05 ENCOUNTER — Other Ambulatory Visit: Payer: Self-pay | Admitting: Internal Medicine

## 2019-11-05 ENCOUNTER — Encounter (HOSPITAL_COMMUNITY): Payer: Self-pay

## 2019-11-05 ENCOUNTER — Ambulatory Visit (HOSPITAL_COMMUNITY)
Admission: RE | Admit: 2019-11-05 | Discharge: 2019-11-05 | Disposition: A | Discharge status: Home / Self Care | Payer: Medicare HMO | Source: Ambulatory Visit | Attending: Internal Medicine | Admitting: Internal Medicine

## 2019-11-05 ENCOUNTER — Other Ambulatory Visit: Payer: Self-pay

## 2019-11-05 DIAGNOSIS — E785 Hyperlipidemia, unspecified: Secondary | ICD-10-CM | POA: Diagnosis not present

## 2019-11-05 DIAGNOSIS — I252 Old myocardial infarction: Secondary | ICD-10-CM | POA: Insufficient documentation

## 2019-11-05 DIAGNOSIS — I255 Ischemic cardiomyopathy: Secondary | ICD-10-CM | POA: Insufficient documentation

## 2019-11-05 DIAGNOSIS — J449 Chronic obstructive pulmonary disease, unspecified: Secondary | ICD-10-CM | POA: Diagnosis not present

## 2019-11-05 DIAGNOSIS — F17210 Nicotine dependence, cigarettes, uncomplicated: Secondary | ICD-10-CM | POA: Insufficient documentation

## 2019-11-05 DIAGNOSIS — D649 Anemia, unspecified: Secondary | ICD-10-CM | POA: Insufficient documentation

## 2019-11-05 DIAGNOSIS — I509 Heart failure, unspecified: Secondary | ICD-10-CM | POA: Insufficient documentation

## 2019-11-05 DIAGNOSIS — Z923 Personal history of irradiation: Secondary | ICD-10-CM | POA: Diagnosis not present

## 2019-11-05 DIAGNOSIS — I878 Other specified disorders of veins: Secondary | ICD-10-CM

## 2019-11-05 DIAGNOSIS — Z8546 Personal history of malignant neoplasm of prostate: Secondary | ICD-10-CM | POA: Insufficient documentation

## 2019-11-05 DIAGNOSIS — Z9981 Dependence on supplemental oxygen: Secondary | ICD-10-CM | POA: Diagnosis not present

## 2019-11-05 DIAGNOSIS — C3490 Malignant neoplasm of unspecified part of unspecified bronchus or lung: Secondary | ICD-10-CM | POA: Insufficient documentation

## 2019-11-05 DIAGNOSIS — Z79899 Other long term (current) drug therapy: Secondary | ICD-10-CM | POA: Diagnosis not present

## 2019-11-05 DIAGNOSIS — I25119 Atherosclerotic heart disease of native coronary artery with unspecified angina pectoris: Secondary | ICD-10-CM | POA: Diagnosis not present

## 2019-11-05 DIAGNOSIS — Z7982 Long term (current) use of aspirin: Secondary | ICD-10-CM | POA: Diagnosis not present

## 2019-11-05 HISTORY — PX: IR IMAGING GUIDED PORT INSERTION: IMG5740

## 2019-11-05 LAB — CBC WITH DIFFERENTIAL/PLATELET
Abs Immature Granulocytes: 0.04 10*3/uL (ref 0.00–0.07)
Basophils Absolute: 0 10*3/uL (ref 0.0–0.1)
Basophils Relative: 0 %
Eosinophils Absolute: 0.4 10*3/uL (ref 0.0–0.5)
Eosinophils Relative: 5 %
HCT: 30.8 % — ABNORMAL LOW (ref 39.0–52.0)
Hemoglobin: 9.8 g/dL — ABNORMAL LOW (ref 13.0–17.0)
Immature Granulocytes: 1 %
Lymphocytes Relative: 21 %
Lymphs Abs: 1.8 10*3/uL (ref 0.7–4.0)
MCH: 28.7 pg (ref 26.0–34.0)
MCHC: 31.8 g/dL (ref 30.0–36.0)
MCV: 90.1 fL (ref 80.0–100.0)
Monocytes Absolute: 1.1 10*3/uL — ABNORMAL HIGH (ref 0.1–1.0)
Monocytes Relative: 13 %
Neutro Abs: 5 10*3/uL (ref 1.7–7.7)
Neutrophils Relative %: 60 %
Platelets: 322 10*3/uL (ref 150–400)
RBC: 3.42 MIL/uL — ABNORMAL LOW (ref 4.22–5.81)
RDW: 17.2 % — ABNORMAL HIGH (ref 11.5–15.5)
WBC: 8.3 10*3/uL (ref 4.0–10.5)
nRBC: 0 % (ref 0.0–0.2)

## 2019-11-05 LAB — PROTIME-INR
INR: 1.1 (ref 0.8–1.2)
Prothrombin Time: 13.5 seconds (ref 11.4–15.2)

## 2019-11-05 MED ORDER — CEFAZOLIN SODIUM-DEXTROSE 2-4 GM/100ML-% IV SOLN
2.0000 g | Freq: Once | INTRAVENOUS | Status: AC
  Administered 2019-11-05: 2 g via INTRAVENOUS

## 2019-11-05 MED ORDER — SODIUM CHLORIDE 0.9 % IV SOLN: INTRAVENOUS | Status: DC

## 2019-11-05 MED ORDER — FENTANYL CITRATE (PF) 100 MCG/2ML IJ SOLN
INTRAMUSCULAR | Status: AC | PRN
  Administered 2019-11-05: 50 ug via INTRAVENOUS

## 2019-11-05 MED ORDER — HEPARIN SOD (PORK) LOCK FLUSH 100 UNIT/ML IV SOLN
INTRAVENOUS | Status: AC
  Filled 2019-11-05: qty 5

## 2019-11-05 MED ORDER — MIDAZOLAM HCL 2 MG/2ML IJ SOLN
INTRAMUSCULAR | Status: AC
  Filled 2019-11-05: qty 4

## 2019-11-05 MED ORDER — MIDAZOLAM HCL 2 MG/2ML IJ SOLN
INTRAMUSCULAR | Status: AC | PRN
  Administered 2019-11-05 (×2): 1 mg via INTRAVENOUS

## 2019-11-05 MED ORDER — LIDOCAINE-EPINEPHRINE (PF) 1 %-1:200000 IJ SOLN
INTRAMUSCULAR | Status: AC | PRN
  Administered 2019-11-05: 10 mL

## 2019-11-05 MED ORDER — FENTANYL CITRATE (PF) 100 MCG/2ML IJ SOLN
INTRAMUSCULAR | Status: AC
  Filled 2019-11-05: qty 2

## 2019-11-05 MED ORDER — LIDOCAINE-EPINEPHRINE 1 %-1:100000 IJ SOLN
INTRAMUSCULAR | Status: AC
  Filled 2019-11-05: qty 1

## 2019-11-05 MED ORDER — CEFAZOLIN SODIUM-DEXTROSE 2-4 GM/100ML-% IV SOLN
INTRAVENOUS | Status: AC
  Filled 2019-11-05: qty 100
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
## 2019-11-05 NOTE — Discharge Instructions (Signed)
Implanted Port Insertion, Care After This sheet gives you information about how to care for yourself after your procedure. Your health care provider may also give you more specific instructions. If you have problems or questions, contact your health care provider. What can I expect after the procedure? After the procedure, it is common to have:  Discomfort at the port insertion site.  Bruising on the skin over the port. This should improve over 3-4 days. Follow these instructions at home: [facility] care  After your port is placed, you will get a manufacturer's information card. The card has information about your port. Keep this card with you at all times.  Take care of the port as told by your health care provider. Ask your health care provider if you or a family member can get training for taking care of the port at home. A home health care nurse may also take care of the port.  Make sure to remember what type of port you have. Incision care      Follow instructions from your health care provider about how to take care of your port insertion site. Make sure you: ? Wash your hands with soap and water before and after you change your bandage (dressing). If soap and water are not available, use hand sanitizer. ? Change your dressing as told by your health care provider. ? Leave stitches (sutures), skin glue, or adhesive strips in place. These skin closures may need to stay in place for 2 weeks or longer. If adhesive strip edges start to loosen and curl up, you may trim the loose edges. Do not remove adhesive strips completely unless your health care provider tells you to do that.  Check your port insertion site every day for signs of infection. Check for: ? Redness, swelling, or pain. ? Fluid or blood. ? Warmth. ? Pus or a bad smell. Activity  Return to your normal activities as told by your health care provider. Ask your health care provider what activities are safe for you.  Do not  lift anything that is heavier than 10 lb (4.5 kg), or the limit that you are told, until your health care provider says that it is safe. General instructions  Take over-the-counter and prescription medicines only as told by your health care provider.  Do not take baths, swim, or use a hot tub until your health care provider approves. Ask your health care provider if you may take showers. You may only be allowed to take sponge baths.  Do not drive for 24 hours if you were given a sedative during your procedure.  Wear a medical alert bracelet in case of an emergency. This will tell any health care providers that you have a port.  Keep all follow-up visits as told by your health care provider. This is important. Contact a health care provider if:  You cannot flush your port with saline as directed, or you cannot draw blood from the port.  You have a fever or chills.  You have redness, swelling, or pain around your port insertion site.  You have fluid or blood coming from your port insertion site.  Your port insertion site feels warm to the touch.  You have pus or a bad smell coming from the port insertion site. Get help right away if:  You have chest pain or shortness of breath.  You have bleeding from your port that you cannot control. Summary  Take care of the port as told by your health  care provider. Keep the manufacturer's information card with you at all times.  Change your dressing as told by your health care provider.  Contact a health care provider if you have a fever or chills or if you have redness, swelling, or pain around your port insertion site.  Keep all follow-up visits as told by your health care provider. This information is not intended to replace advice given to you by your health care provider. Make sure you discuss any questions you have with your health care provider. Document Revised: 12/12/2017 Document Reviewed: 12/12/2017 Elsevier Patient Education   [place]. Moderate Conscious Sedation, Adult, Care After These instructions provide you with information about caring for yourself after your procedure. Your health care provider may also give you more specific instructions. Your treatment has been planned according to current medical practices, but problems sometimes occur. Call your health care provider if you have any problems or questions after your procedure. What can I expect after the procedure? After your procedure, it is common:  To feel sleepy for several hours.  To feel clumsy and have poor balance for several hours.  To have poor judgment for several hours.  To vomit if you eat too soon. Follow these instructions at home: For at least 24 hours after the procedure:   Do not: ? Participate in activities where you could fall or become injured. ? Drive. ? Use heavy machinery. ? Drink alcohol. ? Take sleeping pills or medicines that cause drowsiness. ? Make important decisions or sign legal documents. ? Take care of children on your own.  Rest. Eating and drinking  Follow the diet recommended by your health care provider.  If you vomit: ? Drink water, juice, or soup when you can drink without vomiting. ? Make sure you have little or no nausea before eating solid foods. General instructions  Have a responsible adult stay with you until you are awake and alert.  Take over-the-counter and prescription medicines only as told by your health care provider.  If you smoke, do not smoke without supervision.  Keep all follow-up visits as told by your health care provider. This is important. Contact a health care provider if:  You keep feeling nauseous or you keep vomiting.  You feel light-headed.  You develop a rash.  You have a fever. Get help right away if:  You have trouble breathing. This information is not intended to replace advice given to you by your health care provider. Make sure you discuss any  questions you have with your health care provider. Document Revised: 04/28/2017 Document Reviewed: 09/05/2015 Elsevier Patient Education  2020 [place] may shower tomorrow after 3 pm, you may remove the dressing, pat area dry, you do not have to put anything else back on it.  Do not use elma cream or the numbing medication until the skin glue has come off, it usually takes 2 weeks. Recommend a button up shirt to wear to the cancer center, don't forget to tell everyone that you have a port.

## 2019-11-05 NOTE — Procedures (Signed)
Interventional Radiology Procedure Note  Procedure: RT IJ POWER PORT  Complications: None  Estimated Blood Loss: MIN  Findings: TIP SVCRA       

## 2019-11-14 ENCOUNTER — Inpatient Hospital Stay: Payer: Medicare HMO | Attending: Physician Assistant | Admitting: Internal Medicine

## 2019-11-14 ENCOUNTER — Inpatient Hospital Stay: Payer: Medicare HMO

## 2019-11-14 ENCOUNTER — Encounter: Payer: Self-pay | Admitting: Internal Medicine

## 2019-11-14 ENCOUNTER — Other Ambulatory Visit: Payer: Self-pay

## 2019-11-14 VITALS — BP 92/60 | HR 84 | Temp 97.9°F | Resp 17 | Ht 68.0 in | Wt 123.7 lb

## 2019-11-14 DIAGNOSIS — I252 Old myocardial infarction: Secondary | ICD-10-CM | POA: Diagnosis not present

## 2019-11-14 DIAGNOSIS — Z79899 Other long term (current) drug therapy: Secondary | ICD-10-CM | POA: Insufficient documentation

## 2019-11-14 DIAGNOSIS — Z5111 Encounter for antineoplastic chemotherapy: Secondary | ICD-10-CM | POA: Diagnosis present

## 2019-11-14 DIAGNOSIS — I2510 Atherosclerotic heart disease of native coronary artery without angina pectoris: Secondary | ICD-10-CM | POA: Insufficient documentation

## 2019-11-14 DIAGNOSIS — C3411 Malignant neoplasm of upper lobe, right bronchus or lung: Secondary | ICD-10-CM | POA: Diagnosis not present

## 2019-11-14 DIAGNOSIS — R918 Other nonspecific abnormal finding of lung field: Secondary | ICD-10-CM

## 2019-11-14 DIAGNOSIS — C3491 Malignant neoplasm of unspecified part of right bronchus or lung: Secondary | ICD-10-CM

## 2019-11-14 DIAGNOSIS — R630 Anorexia: Secondary | ICD-10-CM | POA: Diagnosis not present

## 2019-11-14 DIAGNOSIS — R5383 Other fatigue: Secondary | ICD-10-CM | POA: Diagnosis not present

## 2019-11-14 DIAGNOSIS — J449 Chronic obstructive pulmonary disease, unspecified: Secondary | ICD-10-CM | POA: Insufficient documentation

## 2019-11-14 DIAGNOSIS — C61 Malignant neoplasm of prostate: Secondary | ICD-10-CM | POA: Insufficient documentation

## 2019-11-14 DIAGNOSIS — Z5112 Encounter for antineoplastic immunotherapy: Secondary | ICD-10-CM

## 2019-11-14 DIAGNOSIS — I255 Ischemic cardiomyopathy: Secondary | ICD-10-CM | POA: Diagnosis not present

## 2019-11-14 DIAGNOSIS — Z95828 Presence of other vascular implants and grafts: Secondary | ICD-10-CM | POA: Insufficient documentation

## 2019-11-14 LAB — CMP (CANCER CENTER ONLY)
ALT: 13 U/L (ref 0–44)
AST: 21 U/L (ref 15–41)
Albumin: 2.9 g/dL — ABNORMAL LOW (ref 3.5–5.0)
Alkaline Phosphatase: 56 U/L (ref 38–126)
Anion gap: 9 (ref 5–15)
BUN: 16 mg/dL (ref 6–20)
CO2: 25 mmol/L (ref 22–32)
Calcium: 9.4 mg/dL (ref 8.9–10.3)
Chloride: 103 mmol/L (ref 98–111)
Creatinine: 1.17 mg/dL (ref 0.61–1.24)
GFR, Est AFR Am: >60 mL/min (ref >60)
GFR, Estimated: >60 mL/min (ref >60)
Glucose, Bld: 100 mg/dL — ABNORMAL HIGH (ref 70–99)
Potassium: 4.6 mmol/L (ref 3.5–5.1)
Sodium: 137 mmol/L (ref 135–145)
Total Bilirubin: 0.3 mg/dL (ref 0.3–1.2)
Total Protein: 8.1 g/dL (ref 6.5–8.1)

## 2019-11-14 LAB — CBC WITH DIFFERENTIAL (CANCER CENTER ONLY)
Abs Immature Granulocytes: 0.03 10*3/uL (ref 0.00–0.07)
Basophils Absolute: 0 10*3/uL (ref 0.0–0.1)
Basophils Relative: 1 %
Eosinophils Absolute: 0.5 10*3/uL (ref 0.0–0.5)
Eosinophils Relative: 6 %
HCT: 25.7 % — ABNORMAL LOW (ref 39.0–52.0)
Hemoglobin: 8.3 g/dL — ABNORMAL LOW (ref 13.0–17.0)
Immature Granulocytes: 0 %
Lymphocytes Relative: 17 %
Lymphs Abs: 1.4 10*3/uL (ref 0.7–4.0)
MCH: 28.4 pg (ref 26.0–34.0)
MCHC: 32.3 g/dL (ref 30.0–36.0)
MCV: 88 fL (ref 80.0–100.0)
Monocytes Absolute: 1 10*3/uL (ref 0.1–1.0)
Monocytes Relative: 12 %
Neutro Abs: 5.3 10*3/uL (ref 1.7–7.7)
Neutrophils Relative %: 64 %
Platelet Count: 441 10*3/uL — ABNORMAL HIGH (ref 150–400)
RBC: 2.92 MIL/uL — ABNORMAL LOW (ref 4.22–5.81)
RDW: 16.7 % — ABNORMAL HIGH (ref 11.5–15.5)
WBC Count: 8.3 10*3/uL (ref 4.0–10.5)
nRBC: 0 % (ref 0.0–0.2)

## 2019-11-14 LAB — TSH: TSH: 1.023 u[IU]/mL (ref 0.320–4.118)

## 2019-11-14 MED ORDER — CYANOCOBALAMIN 1000 MCG/ML IJ SOLN
1000.0000 ug | Freq: Once | INTRAMUSCULAR | Status: AC
  Administered 2019-11-14: 1000 ug via INTRAMUSCULAR

## 2019-11-14 MED ORDER — SODIUM CHLORIDE 0.9 % IV SOLN
200.0000 mg | Freq: Once | INTRAVENOUS | Status: AC
  Administered 2019-11-14: 200 mg via INTRAVENOUS
  Filled 2019-11-14: qty 8

## 2019-11-14 MED ORDER — SODIUM CHLORIDE 0.9% FLUSH
10.0000 mL | Freq: Once | INTRAVENOUS | Status: AC
  Administered 2019-11-14: 10 mL
  Filled 2019-11-14: qty 10

## 2019-11-14 MED ORDER — HEPARIN SOD (PORK) LOCK FLUSH 100 UNIT/ML IV SOLN
500.0000 [IU] | Freq: Once | INTRAVENOUS | Status: AC | PRN
  Administered 2019-11-14: 500 [IU]
  Filled 2019-11-14: qty 5

## 2019-11-14 MED ORDER — PROCHLORPERAZINE MALEATE 10 MG PO TABS
10.0000 mg | ORAL_TABLET | Freq: Once | ORAL | Status: AC
  Administered 2019-11-14: 10 mg via ORAL

## 2019-11-14 MED ORDER — SODIUM CHLORIDE 0.9 % IV SOLN
500.0000 mg/m2 | Freq: Once | INTRAVENOUS | Status: AC
  Administered 2019-11-14: 800 mg via INTRAVENOUS
  Filled 2019-11-14: qty 20

## 2019-11-14 MED ORDER — PROCHLORPERAZINE MALEATE 10 MG PO TABS
ORAL_TABLET | ORAL | Status: AC
  Filled 2019-11-14: qty 1

## 2019-11-14 MED ORDER — CYANOCOBALAMIN 1000 MCG/ML IJ SOLN
INTRAMUSCULAR | Status: AC
  Filled 2019-11-14: qty 1

## 2019-11-14 MED ORDER — SODIUM CHLORIDE 0.9% FLUSH
10.0000 mL | INTRAVENOUS | Status: DC | PRN
  Administered 2019-11-14: 10 mL
  Filled 2019-11-14: qty 10

## 2019-11-14 MED ORDER — SODIUM CHLORIDE 0.9 % IV SOLN
Freq: Once | INTRAVENOUS | Status: AC
  Filled 2019-11-14: qty 250

## 2019-11-14 NOTE — Patient Instructions (Addendum)
[place] Discharge Instructions for Patients Receiving Chemotherapy  Today you received the following chemotherapy agents: Keytruda, Alimta.  To help prevent nausea and vomiting after your treatment, we encourage you to take your nausea medication as directed.   If you develop nausea and vomiting that is not controlled by your nausea medication, call the clinic.   BELOW ARE SYMPTOMS THAT SHOULD BE REPORTED IMMEDIATELY:  *FEVER GREATER THAN 100.5 F  *CHILLS WITH OR WITHOUT FEVER  NAUSEA AND VOMITING THAT IS NOT CONTROLLED WITH YOUR NAUSEA MEDICATION  *UNUSUAL SHORTNESS OF BREATH  *UNUSUAL BRUISING OR BLEEDING  TENDERNESS IN MOUTH AND THROAT WITH OR WITHOUT PRESENCE OF ULCERS  *URINARY PROBLEMS  *BOWEL PROBLEMS  UNUSUAL RASH Items with * indicate a potential emergency and should be followed up as soon as possible.  Feel free to call the clinic should you have any questions or concerns. The clinic phone number is [phone].  Please show the [place] at check-in to the Emergency Department and triage nurse.  Cyanocobalamin, Vitamin B12 injection What is this medicine? CYANOCOBALAMIN (sye an oh koe BAL a min) is a man made form of vitamin B12. Vitamin B12 is used in the growth of healthy blood cells, nerve cells, and proteins in the body. It also helps with the metabolism of fats and carbohydrates. This medicine is used to treat people who can not absorb vitamin B12. This medicine may be used for other purposes; ask your health care provider or pharmacist if you have questions. COMMON BRAND NAME(S): B-12 Compliance Kit, B-12 Injection Kit, Cyomin, LA-12, Nutri-Twelve, Physicians EZ Use B-12, Primabalt What should I tell my health care provider before I take this medicine? They need to know if you have any of these conditions:  kidney disease  Leber's disease  megaloblastic anemia  an unusual or allergic reaction to cyanocobalamin, cobalt, other  medicines, foods, dyes, or preservatives  pregnant or trying to get pregnant  breast-feeding How should I use this medicine? This medicine is injected into a muscle or deeply under the skin. It is usually given by a health care professional in a clinic or doctor's office. However, your doctor may teach you how to inject yourself. Follow all instructions. Talk to your pediatrician regarding the use of this medicine in children. Special care may be needed. Overdosage: If you think you have taken too much of this medicine contact a poison control center or emergency room at once. NOTE: This medicine is only for you. Do not share this medicine with others. What if I miss a dose? If you are given your dose at a clinic or doctor's office, call to reschedule your appointment. If you give your own injections and you miss a dose, take it as soon as you can. If it is almost time for your next dose, take only that dose. Do not take double or extra doses. What may interact with this medicine?  colchicine  heavy alcohol intake This list may not describe all possible interactions. Give your health care provider a list of all the medicines, herbs, non-prescription drugs, or dietary supplements you use. Also tell them if you smoke, drink alcohol, or use illegal drugs. Some items may interact with your medicine. What should I watch for while using this medicine? Visit your doctor or health care professional regularly. You may need blood work done while you are taking this medicine. You may need to follow a special diet. Talk to your doctor. Limit your alcohol intake  and avoid smoking to get the best benefit. What side effects may I notice from receiving this medicine? Side effects that you should report to your doctor or health care professional as soon as possible:  allergic reactions like skin rash, itching or hives, swelling of the face, lips, or tongue  blue tint to skin  chest tightness,  pain  difficulty breathing, wheezing  dizziness  red, swollen painful area on the leg Side effects that usually do not require medical attention (report to your doctor or health care professional if they continue or are bothersome):  diarrhea  headache This list may not describe all possible side effects. Call your doctor for medical advice about side effects. You may report side effects to FDA at 1-800-FDA-1088. Where should I keep my medicine? Keep out of the reach of children. Store at room temperature between 15 and 30 degrees C (59 and 85 degrees F). Protect from light. Throw away any unused medicine after the expiration date. NOTE: This sheet is a summary. It may not cover all possible information. If you have questions about this medicine, talk to your doctor, pharmacist, or health care provider.  2020 Elsevier/Gold Standard (2007-08-27 22:10:20)
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
## 2019-12-04 ENCOUNTER — Ambulatory Visit (INDEPENDENT_AMBULATORY_CARE_PROVIDER_SITE_OTHER): Payer: Medicare HMO | Admitting: *Deleted

## 2019-12-04 DIAGNOSIS — I5042 Chronic combined systolic (congestive) and diastolic (congestive) heart failure: Secondary | ICD-10-CM

## 2019-12-04 DIAGNOSIS — I255 Ischemic cardiomyopathy: Secondary | ICD-10-CM

## 2019-12-05 ENCOUNTER — Inpatient Hospital Stay: Payer: Medicare HMO

## 2019-12-05 ENCOUNTER — Inpatient Hospital Stay: Payer: Medicare HMO | Admitting: Internal Medicine

## 2019-12-05 ENCOUNTER — Telehealth: Payer: Self-pay | Admitting: Internal Medicine

## 2019-12-05 LAB — CUP PACEART REMOTE DEVICE CHECK
Battery Remaining Longevity: 47 mo
Battery Remaining Percentage: 46 %
Battery Voltage: 2.93 V
Brady Statistic RV Percent Paced: 1 %
Date Time Interrogation Session: 20210707204749
HighPow Impedance: 59 Ohm
HighPow Impedance: 59 Ohm
Implantable Lead Implant Date: 20140929
Implantable Lead Location: 753860
Implantable Lead Model: 181
Implantable Lead Serial Number: 325827
Implantable Pulse Generator Implant Date: 20140929
Lead Channel Impedance Value: 300 Ohm
Lead Channel Pacing Threshold Amplitude: 1 V
Lead Channel Pacing Threshold Pulse Width: 0.5 ms
Lead Channel Sensing Intrinsic Amplitude: 11.3 mV
Lead Channel Setting Pacing Amplitude: 2.5 V
Lead Channel Setting Pacing Pulse Width: 0.5 ms
Lead Channel Setting Sensing Sensitivity: 0.5 mV
Pulse Gen Serial Number: 7132440

## 2019-12-05 NOTE — Telephone Encounter (Signed)
Pt called in to r/s todays appt - per patient request Monday 7/12 -

## 2019-12-06 NOTE — Progress Notes (Signed)
Remote ICD transmission.   

## 2019-12-09 ENCOUNTER — Inpatient Hospital Stay: Payer: Medicare HMO

## 2019-12-09 ENCOUNTER — Other Ambulatory Visit: Payer: Self-pay

## 2019-12-09 ENCOUNTER — Inpatient Hospital Stay (HOSPITAL_BASED_OUTPATIENT_CLINIC_OR_DEPARTMENT_OTHER): Payer: Medicare HMO | Admitting: Internal Medicine

## 2019-12-09 ENCOUNTER — Inpatient Hospital Stay: Payer: Medicare HMO | Attending: Physician Assistant

## 2019-12-09 ENCOUNTER — Encounter: Payer: Self-pay | Admitting: Internal Medicine

## 2019-12-09 VITALS — HR 98

## 2019-12-09 DIAGNOSIS — I2510 Atherosclerotic heart disease of native coronary artery without angina pectoris: Secondary | ICD-10-CM | POA: Diagnosis not present

## 2019-12-09 DIAGNOSIS — Z85118 Personal history of other malignant neoplasm of bronchus and lung: Secondary | ICD-10-CM | POA: Insufficient documentation

## 2019-12-09 DIAGNOSIS — C3490 Malignant neoplasm of unspecified part of unspecified bronchus or lung: Secondary | ICD-10-CM | POA: Diagnosis not present

## 2019-12-09 DIAGNOSIS — R918 Other nonspecific abnormal finding of lung field: Secondary | ICD-10-CM

## 2019-12-09 DIAGNOSIS — Z5112 Encounter for antineoplastic immunotherapy: Secondary | ICD-10-CM | POA: Insufficient documentation

## 2019-12-09 DIAGNOSIS — C3491 Malignant neoplasm of unspecified part of right bronchus or lung: Secondary | ICD-10-CM

## 2019-12-09 DIAGNOSIS — I255 Ischemic cardiomyopathy: Secondary | ICD-10-CM | POA: Diagnosis not present

## 2019-12-09 DIAGNOSIS — Z5111 Encounter for antineoplastic chemotherapy: Secondary | ICD-10-CM | POA: Insufficient documentation

## 2019-12-09 DIAGNOSIS — C3431 Malignant neoplasm of lower lobe, right bronchus or lung: Secondary | ICD-10-CM | POA: Insufficient documentation

## 2019-12-09 DIAGNOSIS — R634 Abnormal weight loss: Secondary | ICD-10-CM | POA: Diagnosis not present

## 2019-12-09 DIAGNOSIS — C61 Malignant neoplasm of prostate: Secondary | ICD-10-CM | POA: Insufficient documentation

## 2019-12-09 DIAGNOSIS — Z79899 Other long term (current) drug therapy: Secondary | ICD-10-CM | POA: Diagnosis not present

## 2019-12-09 DIAGNOSIS — I509 Heart failure, unspecified: Secondary | ICD-10-CM | POA: Insufficient documentation

## 2019-12-09 DIAGNOSIS — R630 Anorexia: Secondary | ICD-10-CM | POA: Insufficient documentation

## 2019-12-09 DIAGNOSIS — Z95828 Presence of other vascular implants and grafts: Secondary | ICD-10-CM

## 2019-12-09 DIAGNOSIS — I252 Old myocardial infarction: Secondary | ICD-10-CM | POA: Diagnosis not present

## 2019-12-09 LAB — CMP (CANCER CENTER ONLY)
ALT: 11 U/L (ref 0–44)
AST: 16 U/L (ref 15–41)
Albumin: 2.7 g/dL — ABNORMAL LOW (ref 3.5–5.0)
Alkaline Phosphatase: 54 U/L (ref 38–126)
Anion gap: 12 (ref 5–15)
BUN: 21 mg/dL — ABNORMAL HIGH (ref 6–20)
CO2: 21 mmol/L — ABNORMAL LOW (ref 22–32)
Calcium: 10.1 mg/dL (ref 8.9–10.3)
Chloride: 105 mmol/L (ref 98–111)
Creatinine: 1.22 mg/dL (ref 0.61–1.24)
GFR, Est AFR Am: >60 mL/min (ref >60)
GFR, Estimated: >60 mL/min (ref >60)
Glucose, Bld: 106 mg/dL — ABNORMAL HIGH (ref 70–99)
Potassium: 4.3 mmol/L (ref 3.5–5.1)
Sodium: 138 mmol/L (ref 135–145)
Total Bilirubin: 0.2 mg/dL — ABNORMAL LOW (ref 0.3–1.2)
Total Protein: 9 g/dL — ABNORMAL HIGH (ref 6.5–8.1)

## 2019-12-09 LAB — CBC WITH DIFFERENTIAL (CANCER CENTER ONLY)
Abs Immature Granulocytes: 0.02 10*3/uL (ref 0.00–0.07)
Basophils Absolute: 0.1 10*3/uL (ref 0.0–0.1)
Basophils Relative: 1 %
Eosinophils Absolute: 0.4 10*3/uL (ref 0.0–0.5)
Eosinophils Relative: 4 %
HCT: 24.7 % — ABNORMAL LOW (ref 39.0–52.0)
Hemoglobin: 8 g/dL — ABNORMAL LOW (ref 13.0–17.0)
Immature Granulocytes: 0 %
Lymphocytes Relative: 17 %
Lymphs Abs: 1.6 10*3/uL (ref 0.7–4.0)
MCH: 28.5 pg (ref 26.0–34.0)
MCHC: 32.4 g/dL (ref 30.0–36.0)
MCV: 87.9 fL (ref 80.0–100.0)
Monocytes Absolute: 0.8 10*3/uL (ref 0.1–1.0)
Monocytes Relative: 9 %
Neutro Abs: 6.8 10*3/uL (ref 1.7–7.7)
Neutrophils Relative %: 69 %
Platelet Count: 476 10*3/uL — ABNORMAL HIGH (ref 150–400)
RBC: 2.81 MIL/uL — ABNORMAL LOW (ref 4.22–5.81)
RDW: 15 % (ref 11.5–15.5)
WBC Count: 9.7 10*3/uL (ref 4.0–10.5)
nRBC: 0 % (ref 0.0–0.2)

## 2019-12-09 MED ORDER — SODIUM CHLORIDE 0.9% FLUSH
10.0000 mL | INTRAVENOUS | Status: DC | PRN
  Administered 2019-12-09: 10 mL
  Filled 2019-12-09: qty 10

## 2019-12-09 MED ORDER — SODIUM CHLORIDE 0.9 % IV SOLN
500.0000 mg/m2 | Freq: Once | INTRAVENOUS | Status: AC
  Administered 2019-12-09: 800 mg via INTRAVENOUS
  Filled 2019-12-09: qty 12

## 2019-12-09 MED ORDER — SODIUM CHLORIDE 0.9 % IV SOLN
Freq: Once | INTRAVENOUS | Status: AC
  Filled 2019-12-09: qty 250

## 2019-12-09 MED ORDER — PROCHLORPERAZINE MALEATE 10 MG PO TABS
ORAL_TABLET | ORAL | Status: AC
  Filled 2019-12-09: qty 1

## 2019-12-09 MED ORDER — HEPARIN SOD (PORK) LOCK FLUSH 100 UNIT/ML IV SOLN
500.0000 [IU] | Freq: Once | INTRAVENOUS | Status: AC | PRN
  Administered 2019-12-09: 500 [IU]
  Filled 2019-12-09: qty 5

## 2019-12-09 MED ORDER — PROCHLORPERAZINE MALEATE 10 MG PO TABS
10.0000 mg | ORAL_TABLET | Freq: Once | ORAL | Status: AC
  Administered 2019-12-09: 10 mg via ORAL

## 2019-12-09 MED ORDER — SODIUM CHLORIDE 0.9 % IV SOLN
200.0000 mg | Freq: Once | INTRAVENOUS | Status: AC
  Administered 2019-12-09: 200 mg via INTRAVENOUS
  Filled 2019-12-09: qty 8

## 2019-12-09 MED ORDER — SODIUM CHLORIDE 0.9% FLUSH
10.0000 mL | Freq: Once | INTRAVENOUS | Status: AC
  Administered 2019-12-09: 10 mL
  Filled 2019-12-09: qty 10

## 2019-12-09 NOTE — Patient Instructions (Signed)
[facility] Discharge Instructions for Patients Receiving Chemotherapy  Today you received the following chemotherapy agents Keytruda; Alimta  To help prevent nausea and vomiting after your treatment, we encourage you to take your nausea medication as directed   If you develop nausea and vomiting that is not controlled by your nausea medication, call the clinic.   BELOW ARE SYMPTOMS THAT SHOULD BE REPORTED IMMEDIATELY:  *FEVER GREATER THAN 100.5 F  *CHILLS WITH OR WITHOUT FEVER  NAUSEA AND VOMITING THAT IS NOT CONTROLLED WITH YOUR NAUSEA MEDICATION  *UNUSUAL SHORTNESS OF BREATH  *UNUSUAL BRUISING OR BLEEDING  TENDERNESS IN MOUTH AND THROAT WITH OR WITHOUT PRESENCE OF ULCERS  *URINARY PROBLEMS  *BOWEL PROBLEMS  UNUSUAL RASH Items with * indicate a potential emergency and should be followed up as soon as possible.  Feel free to call the clinic should you have any questions or concerns. The clinic phone number is [phone].  Please show the CHEMO ALERT CARD at check-in to the Emergency Department and triage nurse.   
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
## 2019-12-10 ENCOUNTER — Telehealth: Payer: Self-pay | Admitting: Internal Medicine

## 2019-12-10 NOTE — Telephone Encounter (Signed)
Scheduled appt per 7/12 los - unable to reach pt - left message with new appt date and time

## 2019-12-26 ENCOUNTER — Ambulatory Visit: Payer: Medicare HMO

## 2019-12-26 ENCOUNTER — Ambulatory Visit: Payer: Medicare HMO | Admitting: Internal Medicine

## 2019-12-26 ENCOUNTER — Other Ambulatory Visit: Payer: Medicare HMO

## 2019-12-27 ENCOUNTER — Ambulatory Visit (HOSPITAL_COMMUNITY): Payer: Medicare HMO

## 2019-12-30 ENCOUNTER — Inpatient Hospital Stay: Payer: Medicare HMO | Admitting: Nutrition

## 2019-12-30 ENCOUNTER — Other Ambulatory Visit: Payer: Self-pay

## 2019-12-30 ENCOUNTER — Inpatient Hospital Stay: Payer: Medicare HMO

## 2019-12-30 ENCOUNTER — Other Ambulatory Visit: Payer: Self-pay | Admitting: Internal Medicine

## 2019-12-30 ENCOUNTER — Other Ambulatory Visit: Payer: Self-pay | Admitting: Medical Oncology

## 2019-12-30 ENCOUNTER — Encounter: Payer: Self-pay | Admitting: Internal Medicine

## 2019-12-30 ENCOUNTER — Inpatient Hospital Stay (HOSPITAL_BASED_OUTPATIENT_CLINIC_OR_DEPARTMENT_OTHER): Payer: Medicare HMO | Admitting: Internal Medicine

## 2019-12-30 ENCOUNTER — Inpatient Hospital Stay: Payer: Medicare HMO | Attending: Physician Assistant

## 2019-12-30 VITALS — BP 101/67 | HR 101 | Temp 97.7°F | Resp 18 | Ht 68.0 in

## 2019-12-30 VITALS — HR 100 | Wt 118.3 lb

## 2019-12-30 DIAGNOSIS — I509 Heart failure, unspecified: Secondary | ICD-10-CM | POA: Diagnosis not present

## 2019-12-30 DIAGNOSIS — C3491 Malignant neoplasm of unspecified part of right bronchus or lung: Secondary | ICD-10-CM | POA: Diagnosis not present

## 2019-12-30 DIAGNOSIS — R0609 Other forms of dyspnea: Secondary | ICD-10-CM | POA: Diagnosis not present

## 2019-12-30 DIAGNOSIS — D649 Anemia, unspecified: Secondary | ICD-10-CM

## 2019-12-30 DIAGNOSIS — I252 Old myocardial infarction: Secondary | ICD-10-CM | POA: Diagnosis not present

## 2019-12-30 DIAGNOSIS — T451X5A Adverse effect of antineoplastic and immunosuppressive drugs, initial encounter: Secondary | ICD-10-CM | POA: Diagnosis not present

## 2019-12-30 DIAGNOSIS — I255 Ischemic cardiomyopathy: Secondary | ICD-10-CM | POA: Insufficient documentation

## 2019-12-30 DIAGNOSIS — Z5112 Encounter for antineoplastic immunotherapy: Secondary | ICD-10-CM

## 2019-12-30 DIAGNOSIS — C3411 Malignant neoplasm of upper lobe, right bronchus or lung: Secondary | ICD-10-CM | POA: Insufficient documentation

## 2019-12-30 DIAGNOSIS — J449 Chronic obstructive pulmonary disease, unspecified: Secondary | ICD-10-CM | POA: Diagnosis not present

## 2019-12-30 DIAGNOSIS — I2510 Atherosclerotic heart disease of native coronary artery without angina pectoris: Secondary | ICD-10-CM | POA: Insufficient documentation

## 2019-12-30 DIAGNOSIS — C61 Malignant neoplasm of prostate: Secondary | ICD-10-CM | POA: Insufficient documentation

## 2019-12-30 DIAGNOSIS — Z923 Personal history of irradiation: Secondary | ICD-10-CM | POA: Insufficient documentation

## 2019-12-30 DIAGNOSIS — D6481 Anemia due to antineoplastic chemotherapy: Secondary | ICD-10-CM | POA: Insufficient documentation

## 2019-12-30 DIAGNOSIS — Z85118 Personal history of other malignant neoplasm of bronchus and lung: Secondary | ICD-10-CM | POA: Insufficient documentation

## 2019-12-30 DIAGNOSIS — R5383 Other fatigue: Secondary | ICD-10-CM | POA: Diagnosis not present

## 2019-12-30 DIAGNOSIS — R630 Anorexia: Secondary | ICD-10-CM | POA: Insufficient documentation

## 2019-12-30 DIAGNOSIS — R918 Other nonspecific abnormal finding of lung field: Secondary | ICD-10-CM

## 2019-12-30 DIAGNOSIS — D509 Iron deficiency anemia, unspecified: Secondary | ICD-10-CM

## 2019-12-30 DIAGNOSIS — E785 Hyperlipidemia, unspecified: Secondary | ICD-10-CM | POA: Diagnosis not present

## 2019-12-30 DIAGNOSIS — Z5111 Encounter for antineoplastic chemotherapy: Secondary | ICD-10-CM | POA: Diagnosis present

## 2019-12-30 DIAGNOSIS — Z95828 Presence of other vascular implants and grafts: Secondary | ICD-10-CM

## 2019-12-30 DIAGNOSIS — Z79899 Other long term (current) drug therapy: Secondary | ICD-10-CM | POA: Diagnosis not present

## 2019-12-30 LAB — CBC WITH DIFFERENTIAL (CANCER CENTER ONLY)
Abs Immature Granulocytes: 0.04 10*3/uL (ref 0.00–0.07)
Basophils Absolute: 0 10*3/uL (ref 0.0–0.1)
Basophils Relative: 0 %
Eosinophils Absolute: 0.3 10*3/uL (ref 0.0–0.5)
Eosinophils Relative: 3 %
HCT: 21.4 % — ABNORMAL LOW (ref 39.0–52.0)
Hemoglobin: 7 g/dL — ABNORMAL LOW (ref 13.0–17.0)
Immature Granulocytes: 1 %
Lymphocytes Relative: 16 %
Lymphs Abs: 1.2 10*3/uL (ref 0.7–4.0)
MCH: 29 pg (ref 26.0–34.0)
MCHC: 32.7 g/dL (ref 30.0–36.0)
MCV: 88.8 fL (ref 80.0–100.0)
Monocytes Absolute: 0.8 10*3/uL (ref 0.1–1.0)
Monocytes Relative: 11 %
Neutro Abs: 5.2 10*3/uL (ref 1.7–7.7)
Neutrophils Relative %: 69 %
Platelet Count: 651 10*3/uL — ABNORMAL HIGH (ref 150–400)
RBC: 2.41 MIL/uL — ABNORMAL LOW (ref 4.22–5.81)
RDW: 15.9 % — ABNORMAL HIGH (ref 11.5–15.5)
WBC Count: 7.6 10*3/uL (ref 4.0–10.5)
nRBC: 0 % (ref 0.0–0.2)

## 2019-12-30 LAB — CMP (CANCER CENTER ONLY)
ALT: <6 U/L (ref 0–44)
AST: 12 U/L — ABNORMAL LOW (ref 15–41)
Albumin: 2.3 g/dL — ABNORMAL LOW (ref 3.5–5.0)
Alkaline Phosphatase: 49 U/L (ref 38–126)
Anion gap: 8 (ref 5–15)
BUN: 20 mg/dL (ref 6–20)
CO2: 23 mmol/L (ref 22–32)
Calcium: 9.5 mg/dL (ref 8.9–10.3)
Chloride: 106 mmol/L (ref 98–111)
Creatinine: 1.36 mg/dL — ABNORMAL HIGH (ref 0.61–1.24)
GFR, Est AFR Am: >60 mL/min (ref >60)
GFR, Estimated: 57 mL/min — ABNORMAL LOW (ref >60)
Glucose, Bld: 95 mg/dL (ref 70–99)
Potassium: 4.4 mmol/L (ref 3.5–5.1)
Sodium: 137 mmol/L (ref 135–145)
Total Bilirubin: <0.2 mg/dL — ABNORMAL LOW (ref 0.3–1.2)
Total Protein: 8.3 g/dL — ABNORMAL HIGH (ref 6.5–8.1)

## 2019-12-30 LAB — PREPARE RBC (CROSSMATCH)

## 2019-12-30 MED ORDER — PROCHLORPERAZINE MALEATE 10 MG PO TABS
ORAL_TABLET | ORAL | Status: AC
  Filled 2019-12-30: qty 1

## 2019-12-30 MED ORDER — HEPARIN SOD (PORK) LOCK FLUSH 100 UNIT/ML IV SOLN
500.0000 [IU] | Freq: Once | INTRAVENOUS | Status: AC | PRN
  Administered 2019-12-30: 500 [IU]
  Filled 2019-12-30: qty 5

## 2019-12-30 MED ORDER — SODIUM CHLORIDE 0.9 % IV SOLN
200.0000 mg | Freq: Once | INTRAVENOUS | Status: AC
  Administered 2019-12-30: 200 mg via INTRAVENOUS
  Filled 2019-12-30: qty 8

## 2019-12-30 MED ORDER — PROCHLORPERAZINE MALEATE 10 MG PO TABS: 10.0000 mg | ORAL_TABLET | Freq: Once | ORAL | Status: DC

## 2019-12-30 MED ORDER — SODIUM CHLORIDE 0.9 % IV SOLN
390.0000 mg/m2 | Freq: Once | INTRAVENOUS | Status: AC
  Administered 2019-12-30: 600 mg via INTRAVENOUS
  Filled 2019-12-30: qty 4

## 2019-12-30 MED ORDER — SODIUM CHLORIDE 0.9% FLUSH
10.0000 mL | INTRAVENOUS | Status: DC | PRN
  Administered 2019-12-30: 10 mL
  Filled 2019-12-30: qty 10

## 2019-12-30 MED ORDER — SODIUM CHLORIDE 0.9% FLUSH
10.0000 mL | Freq: Once | INTRAVENOUS | Status: AC
  Administered 2019-12-30: 10 mL
  Filled 2019-12-30: qty 10

## 2019-12-30 MED ORDER — SODIUM CHLORIDE 0.9 % IV SOLN
Freq: Once | INTRAVENOUS | Status: AC
  Filled 2019-12-30: qty 250

## 2019-12-30 MED ORDER — SODIUM CHLORIDE 0.9 % IV SOLN: 500.0000 mg/m2 | Freq: Once | INTRAVENOUS | Status: DC

## 2019-12-30 NOTE — Progress Notes (Signed)
Patient was identified to be at risk for malnutrition on the MST secondary to weight loss and poor appetite.  Patient is a 59 year old male diagnosed with recurrent lung cancer receiving Alimta and Keytruda and followed by Dr. [name].  Past medical history includes TB, prostate cancer, MI, hyperlipidemia, COPD, CHF, CAD, and anemia.  Medications include Lipitor, Wellbutrin, and Compazine.  Labs include creatinine 1.36 and albumin 2.3.  Height: 5 feet 8 inches. Weight: 118.5 pounds. Usual body weight: 128 pounds May 2021. BMI: 17.99.  Underweight  Patient reports poor appetite. He has occasional nausea but takes Compazine which seems to help.  He denies vomiting. He denies bowel issues.  He also denies difficulty chewing and swallowing.  Nutrition diagnosis: Unintended weight loss related to recurrent lung cancer as evidenced by 10 pound weight loss.  Intervention: Patient educated on strategies for increasing calories and protein in small frequent meals and snacks. Reviewed high-calorie high-protein foods. Encourage patient to try oral nutrition supplements and provided coupons and samples. Provided fact sheets.  Questions were answered.  Teach back method used.  Contact information given.  Monitoring, evaluation, goals: Patient will tolerate increased calories and protein to minimize further weight loss.  Next visit: Monday, August 23 during infusion.  **Disclaimer: This note was dictated with voice recognition software. Similar sounding words can inadvertently be transcribed and this note may contain transcription errors which may not have been corrected upon publication of note.**

## 2019-12-30 NOTE — Patient Instructions (Signed)
[facility] Discharge Instructions for Patients Receiving Chemotherapy  Today you received the following chemotherapy agents Keytruda; Alimta  To help prevent nausea and vomiting after your treatment, we encourage you to take your nausea medication as directed   If you develop nausea and vomiting that is not controlled by your nausea medication, call the clinic.   BELOW ARE SYMPTOMS THAT SHOULD BE REPORTED IMMEDIATELY:  *FEVER GREATER THAN 100.5 F  *CHILLS WITH OR WITHOUT FEVER  NAUSEA AND VOMITING THAT IS NOT CONTROLLED WITH YOUR NAUSEA MEDICATION  *UNUSUAL SHORTNESS OF BREATH  *UNUSUAL BRUISING OR BLEEDING  TENDERNESS IN MOUTH AND THROAT WITH OR WITHOUT PRESENCE OF ULCERS  *URINARY PROBLEMS  *BOWEL PROBLEMS  UNUSUAL RASH Items with * indicate a potential emergency and should be followed up as soon as possible.  Feel free to call the clinic should you have any questions or concerns. The clinic phone number is [phone].  Please show the CHEMO ALERT CARD at check-in to the Emergency Department and triage nurse.   

## 2019-12-30 NOTE — Progress Notes (Signed)
Dr. [name] would like to decrease Alimta dose to 400 mg/m2. CrCl = 44 mL/min today & pt is anemic.  Kennith Center, Pharm.D., CPP 12/30/2019@2 :45 PM
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
## 2019-12-31 ENCOUNTER — Other Ambulatory Visit: Payer: Self-pay

## 2019-12-31 ENCOUNTER — Inpatient Hospital Stay: Payer: Medicare HMO

## 2019-12-31 DIAGNOSIS — D649 Anemia, unspecified: Secondary | ICD-10-CM

## 2019-12-31 DIAGNOSIS — Z5112 Encounter for antineoplastic immunotherapy: Secondary | ICD-10-CM | POA: Diagnosis not present

## 2019-12-31 MED ORDER — SODIUM CHLORIDE 0.9% FLUSH
10.0000 mL | INTRAVENOUS | Status: AC | PRN
  Administered 2019-12-31: 10 mL
  Filled 2019-12-31: qty 10

## 2019-12-31 MED ORDER — DIPHENHYDRAMINE HCL 25 MG PO CAPS
ORAL_CAPSULE | ORAL | Status: AC
  Filled 2019-12-31: qty 1

## 2019-12-31 MED ORDER — HEPARIN SOD (PORK) LOCK FLUSH 100 UNIT/ML IV SOLN
500.0000 [IU] | Freq: Every day | INTRAVENOUS | Status: AC | PRN
  Administered 2019-12-31: 500 [IU]
  Filled 2019-12-31: qty 5

## 2019-12-31 MED ORDER — SODIUM CHLORIDE 0.9% IV SOLUTION
250.0000 mL | Freq: Once | INTRAVENOUS | Status: AC
  Administered 2019-12-31: 250 mL via INTRAVENOUS
  Filled 2019-12-31: qty 250

## 2019-12-31 MED ORDER — ACETAMINOPHEN 325 MG PO TABS
ORAL_TABLET | ORAL | Status: AC
  Filled 2019-12-31: qty 2

## 2019-12-31 MED ORDER — DIPHENHYDRAMINE HCL 25 MG PO CAPS
25.0000 mg | ORAL_CAPSULE | Freq: Once | ORAL | Status: AC
  Administered 2019-12-31: 25 mg via ORAL

## 2019-12-31 MED ORDER — ACETAMINOPHEN 325 MG PO TABS
650.0000 mg | ORAL_TABLET | Freq: Once | ORAL | Status: AC
  Administered 2019-12-31: 650 mg via ORAL

## 2019-12-31 NOTE — Patient Instructions (Signed)

## 2020-01-01 LAB — TYPE AND SCREEN
ABO/RH(D): O POS
Antibody Screen: NEGATIVE
Unit division: 0
Unit division: 0

## 2020-01-01 LAB — BPAM RBC
Blood Product Expiration Date: 202109022359
Blood Product Expiration Date: 202109022359
ISSUE DATE / TIME: 202108030807
ISSUE DATE / TIME: 202108030807
Unit Type and Rh: 5100
Unit Type and Rh: 5100

## 2020-01-06 ENCOUNTER — Other Ambulatory Visit: Payer: Self-pay

## 2020-01-06 ENCOUNTER — Encounter (HOSPITAL_COMMUNITY): Payer: Self-pay

## 2020-01-06 ENCOUNTER — Ambulatory Visit (HOSPITAL_COMMUNITY)
Admission: RE | Admit: 2020-01-06 | Discharge: 2020-01-06 | Disposition: A | Discharge status: Home / Self Care | Payer: Medicare HMO | Source: Ambulatory Visit | Attending: Internal Medicine | Admitting: Internal Medicine

## 2020-01-06 DIAGNOSIS — C3490 Malignant neoplasm of unspecified part of unspecified bronchus or lung: Secondary | ICD-10-CM | POA: Insufficient documentation

## 2020-01-14 ENCOUNTER — Other Ambulatory Visit: Payer: Self-pay | Admitting: *Deleted

## 2020-01-14 MED ORDER — PROCHLORPERAZINE MALEATE 10 MG PO TABS: 10.0000 mg | ORAL_TABLET | Freq: Four times a day (QID) | ORAL | 0 refills | Status: DC | PRN

## 2020-01-16 ENCOUNTER — Other Ambulatory Visit: Payer: Medicare HMO

## 2020-01-16 ENCOUNTER — Ambulatory Visit: Payer: Medicare HMO

## 2020-01-16 ENCOUNTER — Ambulatory Visit: Payer: Medicare HMO | Admitting: Internal Medicine

## 2020-01-20 ENCOUNTER — Other Ambulatory Visit: Payer: Self-pay | Admitting: Physician Assistant

## 2020-01-20 ENCOUNTER — Inpatient Hospital Stay: Payer: Medicare HMO | Admitting: Nutrition

## 2020-01-20 ENCOUNTER — Inpatient Hospital Stay (HOSPITAL_BASED_OUTPATIENT_CLINIC_OR_DEPARTMENT_OTHER): Payer: Medicare HMO | Admitting: Internal Medicine

## 2020-01-20 ENCOUNTER — Inpatient Hospital Stay: Payer: Medicare HMO

## 2020-01-20 ENCOUNTER — Other Ambulatory Visit: Payer: Self-pay

## 2020-01-20 ENCOUNTER — Telehealth: Payer: Self-pay | Admitting: Physician Assistant

## 2020-01-20 ENCOUNTER — Encounter: Payer: Self-pay | Admitting: Internal Medicine

## 2020-01-20 VITALS — BP 77/60 | HR 102 | Temp 97.8°F | Resp 19 | Ht 68.0 in | Wt 118.0 lb

## 2020-01-20 DIAGNOSIS — E876 Hypokalemia: Secondary | ICD-10-CM

## 2020-01-20 DIAGNOSIS — Z95828 Presence of other vascular implants and grafts: Secondary | ICD-10-CM

## 2020-01-20 DIAGNOSIS — C3400 Malignant neoplasm of unspecified main bronchus: Secondary | ICD-10-CM

## 2020-01-20 DIAGNOSIS — D509 Iron deficiency anemia, unspecified: Secondary | ICD-10-CM

## 2020-01-20 DIAGNOSIS — C3491 Malignant neoplasm of unspecified part of right bronchus or lung: Secondary | ICD-10-CM

## 2020-01-20 DIAGNOSIS — E860 Dehydration: Secondary | ICD-10-CM

## 2020-01-20 DIAGNOSIS — Z5112 Encounter for antineoplastic immunotherapy: Secondary | ICD-10-CM

## 2020-01-20 DIAGNOSIS — I952 Hypotension due to drugs: Secondary | ICD-10-CM | POA: Diagnosis not present

## 2020-01-20 DIAGNOSIS — R918 Other nonspecific abnormal finding of lung field: Secondary | ICD-10-CM

## 2020-01-20 LAB — IRON AND TIBC
Iron: 27 ug/dL — ABNORMAL LOW (ref 42–163)
Saturation Ratios: 16 % — ABNORMAL LOW (ref 20–55)
TIBC: 171 ug/dL — ABNORMAL LOW (ref 202–409)
UIBC: 145 ug/dL (ref 117–376)

## 2020-01-20 LAB — FERRITIN: Ferritin: 638 ng/mL — ABNORMAL HIGH (ref 24–336)

## 2020-01-20 LAB — CBC WITH DIFFERENTIAL (CANCER CENTER ONLY)
Abs Immature Granulocytes: 0.08 10*3/uL — ABNORMAL HIGH (ref 0.00–0.07)
Basophils Absolute: 0 10*3/uL (ref 0.0–0.1)
Basophils Relative: 0 %
Eosinophils Absolute: 0.1 10*3/uL (ref 0.0–0.5)
Eosinophils Relative: 1 %
HCT: 26.3 % — ABNORMAL LOW (ref 39.0–52.0)
Hemoglobin: 8.7 g/dL — ABNORMAL LOW (ref 13.0–17.0)
Immature Granulocytes: 1 %
Lymphocytes Relative: 9 %
Lymphs Abs: 1.1 10*3/uL (ref 0.7–4.0)
MCH: 28.2 pg (ref 26.0–34.0)
MCHC: 33.1 g/dL (ref 30.0–36.0)
MCV: 85.1 fL (ref 80.0–100.0)
Monocytes Absolute: 1.3 10*3/uL — ABNORMAL HIGH (ref 0.1–1.0)
Monocytes Relative: 10 %
Neutro Abs: 9.6 10*3/uL — ABNORMAL HIGH (ref 1.7–7.7)
Neutrophils Relative %: 79 %
Platelet Count: 668 10*3/uL — ABNORMAL HIGH (ref 150–400)
RBC: 3.09 MIL/uL — ABNORMAL LOW (ref 4.22–5.81)
RDW: 16.9 % — ABNORMAL HIGH (ref 11.5–15.5)
WBC Count: 12.2 10*3/uL — ABNORMAL HIGH (ref 4.0–10.5)
nRBC: 0 % (ref 0.0–0.2)

## 2020-01-20 LAB — CMP (CANCER CENTER ONLY)
ALT: <6 U/L (ref 0–44)
AST: 15 U/L (ref 15–41)
Albumin: 2 g/dL — ABNORMAL LOW (ref 3.5–5.0)
Alkaline Phosphatase: 54 U/L (ref 38–126)
Anion gap: 11 (ref 5–15)
BUN: 21 mg/dL — ABNORMAL HIGH (ref 6–20)
CO2: 27 mmol/L (ref 22–32)
Calcium: 8.8 mg/dL — ABNORMAL LOW (ref 8.9–10.3)
Chloride: 98 mmol/L (ref 98–111)
Creatinine: 1.67 mg/dL — ABNORMAL HIGH (ref 0.61–1.24)
GFR, Est AFR Am: 52 mL/min — ABNORMAL LOW (ref >60)
GFR, Estimated: 45 mL/min — ABNORMAL LOW (ref >60)
Glucose, Bld: 100 mg/dL — ABNORMAL HIGH (ref 70–99)
Potassium: 3.2 mmol/L — ABNORMAL LOW (ref 3.5–5.1)
Sodium: 136 mmol/L (ref 135–145)
Total Bilirubin: 0.3 mg/dL (ref 0.3–1.2)
Total Protein: 7.9 g/dL (ref 6.5–8.1)

## 2020-01-20 LAB — TSH: TSH: 2.127 u[IU]/mL (ref 0.320–4.118)

## 2020-01-20 MED ORDER — HEPARIN SOD (PORK) LOCK FLUSH 100 UNIT/ML IV SOLN
500.0000 [IU] | Freq: Once | INTRAVENOUS | Status: AC | PRN
  Administered 2020-01-20: 500 [IU]
  Filled 2020-01-20: qty 5

## 2020-01-20 MED ORDER — PROCHLORPERAZINE MALEATE 10 MG PO TABS
ORAL_TABLET | ORAL | Status: AC
  Filled 2020-01-20: qty 1

## 2020-01-20 MED ORDER — PROCHLORPERAZINE MALEATE 10 MG PO TABS: 10.0000 mg | ORAL_TABLET | Freq: Once | ORAL | Status: DC

## 2020-01-20 MED ORDER — CYANOCOBALAMIN 1000 MCG/ML IJ SOLN
INTRAMUSCULAR | Status: AC
  Filled 2020-01-20: qty 1

## 2020-01-20 MED ORDER — SODIUM CHLORIDE 0.9 % IV SOLN
Freq: Once | INTRAVENOUS | Status: DC
  Filled 2020-01-20: qty 250

## 2020-01-20 MED ORDER — SODIUM CHLORIDE 0.9 % IV SOLN
200.0000 mg | Freq: Once | INTRAVENOUS | Status: AC
  Administered 2020-01-20: 200 mg via INTRAVENOUS
  Filled 2020-01-20: qty 8

## 2020-01-20 MED ORDER — SODIUM CHLORIDE 0.9% FLUSH
10.0000 mL | INTRAVENOUS | Status: DC | PRN
  Administered 2020-01-20: 10 mL
  Filled 2020-01-20: qty 10

## 2020-01-20 MED ORDER — SODIUM CHLORIDE 0.9 % IV SOLN
INTRAVENOUS | Status: AC
  Filled 2020-01-20 (×2): qty 250

## 2020-01-20 MED ORDER — CYANOCOBALAMIN 1000 MCG/ML IJ SOLN
1000.0000 ug | Freq: Once | INTRAMUSCULAR | Status: AC
  Administered 2020-01-20: 1000 ug via INTRAMUSCULAR

## 2020-01-20 MED ORDER — POTASSIUM CHLORIDE CRYS ER 20 MEQ PO TBCR: 20.0000 meq | EXTENDED_RELEASE_TABLET | Freq: Every day | ORAL | 0 refills | Status: DC

## 2020-01-20 MED ORDER — SODIUM CHLORIDE 0.9% FLUSH
10.0000 mL | Freq: Once | INTRAVENOUS | Status: AC
  Administered 2020-01-20: 10 mL
  Filled 2020-01-20: qty 10

## 2020-01-20 NOTE — Progress Notes (Signed)
Per Dr [name] -hold Alimta today  due to serum creatinine. May give Keytruda today with serum creatinine of 1.67

## 2020-01-20 NOTE — Patient Instructions (Signed)
[facility] Discharge Instructions for Patients Receiving Chemotherapy  Today you received the following chemotherapy agents keytruda, B12.  To help prevent nausea and vomiting after your treatment, we encourage you to take your nausea medication   If you develop nausea and vomiting that is not controlled by your nausea medication, call the clinic.   BELOW ARE SYMPTOMS THAT SHOULD BE REPORTED IMMEDIATELY:  *FEVER GREATER THAN 100.5 F  *CHILLS WITH OR WITHOUT FEVER  NAUSEA AND VOMITING THAT IS NOT CONTROLLED WITH YOUR NAUSEA MEDICATION  *UNUSUAL SHORTNESS OF BREATH  *UNUSUAL BRUISING OR BLEEDING  TENDERNESS IN MOUTH AND THROAT WITH OR WITHOUT PRESENCE OF ULCERS  *URINARY PROBLEMS  *BOWEL PROBLEMS  UNUSUAL RASH Items with * indicate a potential emergency and should be followed up as soon as possible.  Feel free to call the clinic should you have any questions or concerns. The clinic phone number is [phone].  Please show the [facility] at check-in to the Emergency Department and triage nurse.
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
## 2020-01-20 NOTE — Telephone Encounter (Signed)
I called the patient regarding his potassium which was a little bit low on his routine labs at 3.2.  Let the patient know that I sent a weekly prescription in for potassium supplements.  He was instructed to take 1 tablet p.o. daily.  He expressed understanding.

## 2020-01-20 NOTE — Progress Notes (Signed)
Nutrition follow-up completed with patient during infusion for recurrent lung cancer.   Patient was not very talkative. He reports his appetite is increased however he does not seem to be consuming increased food amounts. He denies nausea, constipation and diarrhea. He did not like oral nutrition supplements. Last weight documented was 118.5 pounds on August 2.  There is no new weight documented today. Per MD note patient has developed ascites in the abdomen and pelvis secondary to congestive heart failure however the scan showed no concerning findings for disease progression.  Nutrition diagnosis: Unintended weight loss cannot be evaluated.  Intervention: Stressed importance of patient increasing calories and protein to promote weight stabilization/weight gain and increased energy and healing. Reviewed examples of high-calorie snacks and suggested snacks 3 times daily between meals. Support and encouragement provided.  Monitoring, evaluation, goals: Patient will work to increase calories and protein to minimize weight loss.  Next visit: To be scheduled with treatment as needed.  **Disclaimer: This note was dictated with voice recognition software. Similar sounding words can inadvertently be transcribed and this note may contain transcription errors which may not have been corrected upon publication of note.**

## 2020-01-20 NOTE — Patient Instructions (Signed)
Steps to Quit Smoking Smoking tobacco is the leading cause of preventable death. It can affect almost every organ in the body. Smoking puts you and people around you at risk for many serious, long-lasting (chronic) diseases. Quitting smoking can be hard, but it is one of the best things that you can do for your health. It is never too late to quit. How do I get ready to quit? When you decide to quit smoking, make a plan to help you succeed. Before you quit:  Pick a date to quit. Set a date within the next 2 weeks to give you time to prepare.  Write down the reasons why you are quitting. Keep this list in places where you will see it often.  Tell your family, friends, and co-workers that you are quitting. Their support is important.  Talk with your doctor about the choices that may help you quit.  Find out if your health insurance will pay for these treatments.  Know the people, places, things, and activities that make you want to smoke (triggers). Avoid them. What first steps can I take to quit smoking?  Throw away all cigarettes at home, at work, and in your car.  Throw away the things that you use when you smoke, such as ashtrays and lighters.  Clean your car. Make sure to empty the ashtray.  Clean your home, including curtains and carpets. What can I do to help me quit smoking? Talk with your doctor about taking medicines and seeing a counselor at the same time. You are more likely to succeed when you do both.  If you are pregnant or breastfeeding, talk with your doctor about counseling or other ways to quit smoking. Do not take medicine to help you quit smoking unless your doctor tells you to do so. To quit smoking: Quit right away  Quit smoking totally, instead of slowly cutting back on how much you smoke over a period of time.  Go to counseling. You are more likely to quit if you go to counseling sessions regularly. Take medicine You may take medicines to help you quit. Some  medicines need a prescription, and some you can buy over-the-counter. Some medicines may contain a drug called nicotine to replace the nicotine in cigarettes. Medicines may:  Help you to stop having the desire to smoke (cravings).  Help to stop the problems that come when you stop smoking (withdrawal symptoms). Your doctor may ask you to use:  Nicotine patches, gum, or lozenges.  Nicotine inhalers or sprays.  Non-nicotine medicine that is taken by mouth. Find resources Find resources and other ways to help you quit smoking and remain smoke-free after you quit. These resources are most helpful when you use them often. They include:  Online chats with a counselor.  Phone quitlines.  Printed self-help materials.  Support groups or group counseling.  Text messaging programs.  Mobile phone apps. Use apps on your mobile phone or tablet that can help you stick to your quit plan. There are many free apps for mobile phones and tablets as well as websites. Examples include Quit Guide from the CDC and smokefree.gov  What things can I do to make it easier to quit?   Talk to your family and friends. Ask them to support and encourage you.  Call a phone quitline (1-800-QUIT-NOW), reach out to support groups, or work with a counselor.  Ask people who smoke to not smoke around you.  Avoid places that make you want to smoke,   such as: ? Bars. ? Parties. ? Smoke-break areas at work.  Spend time with people who do not smoke.  Lower the stress in your life. Stress can make you want to smoke. Try these things to help your stress: ? Getting regular exercise. ? Doing deep-breathing exercises. ? Doing yoga. ? Meditating. ? Doing a body scan. To do this, close your eyes, focus on one area of your body at a time from head to toe. Notice which parts of your body are tense. Try to relax the muscles in those areas. How will I feel when I quit smoking? Day 1 to 3 weeks Within the first 24 hours,  you may start to have some problems that come from quitting tobacco. These problems are very bad 2-3 days after you quit, but they do not often last for more than 2-3 weeks. You may get these symptoms:  Mood swings.  Feeling restless, nervous, angry, or annoyed.  Trouble concentrating.  Dizziness.  Strong desire for high-sugar foods and nicotine.  Weight gain.  Trouble pooping (constipation).  Feeling like you may vomit (nausea).  Coughing or a sore throat.  Changes in how the medicines that you take for other issues work in your body.  Depression.  Trouble sleeping (insomnia). Week 3 and afterward After the first 2-3 weeks of quitting, you may start to notice more positive results, such as:  Better sense of smell and taste.  Less coughing and sore throat.  Slower heart rate.  Lower blood pressure.  Clearer skin.  Better breathing.  Fewer sick days. Quitting smoking can be hard. Do not give up if you fail the first time. Some people need to try a few times before they succeed. Do your best to stick to your quit plan, and talk with your doctor if you have any questions or concerns. Summary  Smoking tobacco is the leading cause of preventable death. Quitting smoking can be hard, but it is one of the best things that you can do for your health.  When you decide to quit smoking, make a plan to help you succeed.  Quit smoking right away, not slowly over a period of time.  When you start quitting, seek help from your doctor, family, or friends. This information is not intended to replace advice given to you by your health care provider. Make sure you discuss any questions you have with your health care provider. Document Revised: 02/08/2019 Document Reviewed: 08/04/2018 Elsevier Patient Education  2020 Elsevier Inc.  
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
## 2020-01-24 ENCOUNTER — Encounter: Payer: Self-pay | Admitting: General Practice

## 2020-01-24 ENCOUNTER — Other Ambulatory Visit: Payer: Self-pay

## 2020-01-24 ENCOUNTER — Ambulatory Visit (INDEPENDENT_AMBULATORY_CARE_PROVIDER_SITE_OTHER): Payer: Medicare HMO | Admitting: General Practice

## 2020-01-24 VITALS — BP 92/70 | HR 98 | Ht 68.0 in | Wt 116.8 lb

## 2020-01-24 DIAGNOSIS — E785 Hyperlipidemia, unspecified: Secondary | ICD-10-CM

## 2020-01-24 DIAGNOSIS — I2510 Atherosclerotic heart disease of native coronary artery without angina pectoris: Secondary | ICD-10-CM | POA: Diagnosis not present

## 2020-01-24 DIAGNOSIS — I255 Ischemic cardiomyopathy: Secondary | ICD-10-CM

## 2020-01-24 DIAGNOSIS — I950 Idiopathic hypotension: Secondary | ICD-10-CM

## 2020-01-24 DIAGNOSIS — Z720 Tobacco use: Secondary | ICD-10-CM | POA: Diagnosis not present

## 2020-01-24 NOTE — Patient Instructions (Addendum)
Medication Instructions:  HOLD CARVEDILOL  INCREASE IRON AND TAKE VITAMIN C  *If you need a refill on your cardiac medications before your next appointment, please call your pharmacy*  Special Instructions INCREASE HYDRATION-CARRY WATER BOTTLE WITH YOU  CONTINUE TO REST  Follow-Up: Your next appointment:  1 month(s)  In Person with Kirk Ruths, MD -[place], FNP-C  At [facility], you and your health needs are our priority.  As part of our continuing mission to provide you with exceptional heart care, we have created designated Provider Care Teams.  These Care Teams include your primary Cardiologist (physician) and Advanced Practice Providers (APPs -  Physician Assistants and Nurse Practitioners) who all work together to provide you with the care you need, when you need it.  We recommend signing up for the patient portal called "MyChart".  Sign up information is provided on this After Visit Summary.  MyChart is used to connect with patients for Virtual Visits (Telemedicine).  Patients are able to view lab/test results, encounter notes, upcoming appointments, etc.  Non-urgent messages can be sent to your provider as well.   To learn more about what you can do with MyChart, go to [email or web address].

## 2020-01-31 ENCOUNTER — Other Ambulatory Visit: Payer: Self-pay

## 2020-01-31 ENCOUNTER — Other Ambulatory Visit: Payer: Self-pay | Admitting: Cardiology

## 2020-01-31 MED ORDER — ENTRESTO 24-26 MG PO TABS: 1.0000 | ORAL_TABLET | Freq: Two times a day (BID) | ORAL | 4 refills | Status: DC

## 2020-01-31 NOTE — Telephone Encounter (Signed)
*  STAT* If patient is at the pharmacy, call can be transferred to refill team.   1. Which medications need to be refilled? (please list name of each medication and dose if known) ENTRESTO 24-26 MG  2. Which pharmacy/location (including street and city if local pharmacy) is medication to be sent to? [place], [place]  3. Do they need a 30 day or 90 day supply? [place]

## 2020-02-07 ENCOUNTER — Other Ambulatory Visit: Payer: Self-pay | Admitting: *Deleted

## 2020-02-07 ENCOUNTER — Other Ambulatory Visit: Payer: Self-pay

## 2020-02-07 DIAGNOSIS — R918 Other nonspecific abnormal finding of lung field: Secondary | ICD-10-CM

## 2020-02-07 MED ORDER — ENTRESTO 24-26 MG PO TABS: 1.0000 | ORAL_TABLET | Freq: Two times a day (BID) | ORAL | 0 refills | Status: DC

## 2020-02-07 MED ORDER — FOLIC ACID 1 MG PO TABS: 1.0000 mg | ORAL_TABLET | Freq: Every day | ORAL | 2 refills | Status: DC

## 2020-02-07 NOTE — Telephone Encounter (Signed)
90 day supply sent to pharmacy as requested. Mail order was sent and confirmed on 9/3.

## 2020-02-07 NOTE — Telephone Encounter (Signed)
Patient is calling to follow up regarding refill request submitted on 01/31/20. He states he is completely out of medication and he has not yet received [employer] order. He is requesting to have an emergency supply of medication sent to [place], [place]. Please advise.

## 2020-02-10 ENCOUNTER — Inpatient Hospital Stay: Payer: Medicare HMO

## 2020-02-10 ENCOUNTER — Inpatient Hospital Stay: Payer: Medicare HMO | Admitting: Physician Assistant

## 2020-02-10 ENCOUNTER — Inpatient Hospital Stay: Payer: Medicare HMO | Attending: Physician Assistant

## 2020-02-10 DIAGNOSIS — C3411 Malignant neoplasm of upper lobe, right bronchus or lung: Secondary | ICD-10-CM | POA: Insufficient documentation

## 2020-02-10 DIAGNOSIS — C61 Malignant neoplasm of prostate: Secondary | ICD-10-CM | POA: Insufficient documentation

## 2020-02-10 DIAGNOSIS — Z5111 Encounter for antineoplastic chemotherapy: Secondary | ICD-10-CM | POA: Insufficient documentation

## 2020-02-10 DIAGNOSIS — E785 Hyperlipidemia, unspecified: Secondary | ICD-10-CM | POA: Insufficient documentation

## 2020-02-10 DIAGNOSIS — I252 Old myocardial infarction: Secondary | ICD-10-CM | POA: Insufficient documentation

## 2020-02-10 DIAGNOSIS — Z5112 Encounter for antineoplastic immunotherapy: Secondary | ICD-10-CM | POA: Insufficient documentation

## 2020-02-10 DIAGNOSIS — R0609 Other forms of dyspnea: Secondary | ICD-10-CM | POA: Insufficient documentation

## 2020-02-10 DIAGNOSIS — R5383 Other fatigue: Secondary | ICD-10-CM | POA: Insufficient documentation

## 2020-02-10 DIAGNOSIS — Z79899 Other long term (current) drug therapy: Secondary | ICD-10-CM | POA: Insufficient documentation

## 2020-02-10 DIAGNOSIS — I2510 Atherosclerotic heart disease of native coronary artery without angina pectoris: Secondary | ICD-10-CM | POA: Insufficient documentation

## 2020-02-10 DIAGNOSIS — R188 Other ascites: Secondary | ICD-10-CM | POA: Insufficient documentation

## 2020-02-10 DIAGNOSIS — R0789 Other chest pain: Secondary | ICD-10-CM | POA: Insufficient documentation

## 2020-02-10 DIAGNOSIS — I509 Heart failure, unspecified: Secondary | ICD-10-CM | POA: Insufficient documentation

## 2020-02-10 DIAGNOSIS — Z923 Personal history of irradiation: Secondary | ICD-10-CM | POA: Insufficient documentation

## 2020-02-10 DIAGNOSIS — R531 Weakness: Secondary | ICD-10-CM | POA: Insufficient documentation

## 2020-02-11 ENCOUNTER — Telehealth: Payer: Self-pay | Admitting: Physician Assistant
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
## 2020-02-11 NOTE — Telephone Encounter (Signed)
R./s appt per 9/13 sch msg  - pt is aware of appt date and time on 9/16

## 2020-02-13 ENCOUNTER — Other Ambulatory Visit: Payer: Self-pay

## 2020-02-13 ENCOUNTER — Inpatient Hospital Stay (HOSPITAL_BASED_OUTPATIENT_CLINIC_OR_DEPARTMENT_OTHER): Payer: Medicare HMO | Admitting: Physician Assistant

## 2020-02-13 ENCOUNTER — Other Ambulatory Visit: Payer: Self-pay | Admitting: *Deleted

## 2020-02-13 ENCOUNTER — Inpatient Hospital Stay: Payer: Medicare HMO

## 2020-02-13 ENCOUNTER — Other Ambulatory Visit: Payer: Self-pay | Admitting: Internal Medicine

## 2020-02-13 VITALS — BP 124/72 | HR 111 | Temp 97.9°F | Resp 18 | Ht 68.0 in | Wt 121.9 lb

## 2020-02-13 DIAGNOSIS — I2510 Atherosclerotic heart disease of native coronary artery without angina pectoris: Secondary | ICD-10-CM | POA: Diagnosis not present

## 2020-02-13 DIAGNOSIS — Z95828 Presence of other vascular implants and grafts: Secondary | ICD-10-CM

## 2020-02-13 DIAGNOSIS — D649 Anemia, unspecified: Secondary | ICD-10-CM | POA: Diagnosis not present

## 2020-02-13 DIAGNOSIS — E785 Hyperlipidemia, unspecified: Secondary | ICD-10-CM | POA: Diagnosis not present

## 2020-02-13 DIAGNOSIS — Z5112 Encounter for antineoplastic immunotherapy: Secondary | ICD-10-CM | POA: Diagnosis present

## 2020-02-13 DIAGNOSIS — I252 Old myocardial infarction: Secondary | ICD-10-CM | POA: Diagnosis not present

## 2020-02-13 DIAGNOSIS — Z79899 Other long term (current) drug therapy: Secondary | ICD-10-CM | POA: Diagnosis not present

## 2020-02-13 DIAGNOSIS — R0789 Other chest pain: Secondary | ICD-10-CM | POA: Diagnosis not present

## 2020-02-13 DIAGNOSIS — I509 Heart failure, unspecified: Secondary | ICD-10-CM | POA: Diagnosis not present

## 2020-02-13 DIAGNOSIS — C3411 Malignant neoplasm of upper lobe, right bronchus or lung: Secondary | ICD-10-CM | POA: Diagnosis not present

## 2020-02-13 DIAGNOSIS — C3491 Malignant neoplasm of unspecified part of right bronchus or lung: Secondary | ICD-10-CM | POA: Diagnosis not present

## 2020-02-13 DIAGNOSIS — C61 Malignant neoplasm of prostate: Secondary | ICD-10-CM | POA: Diagnosis present

## 2020-02-13 DIAGNOSIS — R188 Other ascites: Secondary | ICD-10-CM | POA: Diagnosis not present

## 2020-02-13 DIAGNOSIS — R531 Weakness: Secondary | ICD-10-CM | POA: Diagnosis not present

## 2020-02-13 DIAGNOSIS — E876 Hypokalemia: Secondary | ICD-10-CM

## 2020-02-13 DIAGNOSIS — Z5111 Encounter for antineoplastic chemotherapy: Secondary | ICD-10-CM | POA: Diagnosis present

## 2020-02-13 DIAGNOSIS — R0609 Other forms of dyspnea: Secondary | ICD-10-CM | POA: Diagnosis not present

## 2020-02-13 DIAGNOSIS — R5383 Other fatigue: Secondary | ICD-10-CM | POA: Diagnosis not present

## 2020-02-13 DIAGNOSIS — R918 Other nonspecific abnormal finding of lung field: Secondary | ICD-10-CM

## 2020-02-13 DIAGNOSIS — Z923 Personal history of irradiation: Secondary | ICD-10-CM | POA: Diagnosis not present

## 2020-02-13 LAB — CMP (CANCER CENTER ONLY)
ALT: <6 U/L (ref 0–44)
AST: 13 U/L — ABNORMAL LOW (ref 15–41)
Albumin: 2 g/dL — ABNORMAL LOW (ref 3.5–5.0)
Alkaline Phosphatase: 53 U/L (ref 38–126)
Anion gap: 11 (ref 5–15)
BUN: 9 mg/dL (ref 6–20)
CO2: 28 mmol/L (ref 22–32)
Calcium: 9.6 mg/dL (ref 8.9–10.3)
Chloride: 103 mmol/L (ref 98–111)
Creatinine: 0.81 mg/dL (ref 0.61–1.24)
GFR, Est AFR Am: >60 mL/min (ref >60)
GFR, Estimated: >60 mL/min (ref >60)
Glucose, Bld: 196 mg/dL — ABNORMAL HIGH (ref 70–99)
Potassium: 2.8 mmol/L — CL (ref 3.5–5.1)
Sodium: 142 mmol/L (ref 135–145)
Total Bilirubin: <0.2 mg/dL — ABNORMAL LOW (ref 0.3–1.2)
Total Protein: 7.8 g/dL (ref 6.5–8.1)

## 2020-02-13 LAB — PREPARE RBC (CROSSMATCH)

## 2020-02-13 LAB — CBC WITH DIFFERENTIAL (CANCER CENTER ONLY)
Abs Immature Granulocytes: 0.02 10*3/uL (ref 0.00–0.07)
Basophils Absolute: 0 10*3/uL (ref 0.0–0.1)
Basophils Relative: 0 %
Eosinophils Absolute: 0.2 10*3/uL (ref 0.0–0.5)
Eosinophils Relative: 3 %
HCT: 23.6 % — ABNORMAL LOW (ref 39.0–52.0)
Hemoglobin: 7.5 g/dL — ABNORMAL LOW (ref 13.0–17.0)
Immature Granulocytes: 0 %
Lymphocytes Relative: 14 %
Lymphs Abs: 1.1 10*3/uL (ref 0.7–4.0)
MCH: 27.6 pg (ref 26.0–34.0)
MCHC: 31.8 g/dL (ref 30.0–36.0)
MCV: 86.8 fL (ref 80.0–100.0)
Monocytes Absolute: 0.7 10*3/uL (ref 0.1–1.0)
Monocytes Relative: 9 %
Neutro Abs: 5.7 10*3/uL (ref 1.7–7.7)
Neutrophils Relative %: 74 %
Platelet Count: 450 10*3/uL — ABNORMAL HIGH (ref 150–400)
RBC: 2.72 MIL/uL — ABNORMAL LOW (ref 4.22–5.81)
RDW: 16.4 % — ABNORMAL HIGH (ref 11.5–15.5)
WBC Count: 7.8 10*3/uL (ref 4.0–10.5)
nRBC: 0 % (ref 0.0–0.2)

## 2020-02-13 MED ORDER — PROCHLORPERAZINE MALEATE 10 MG PO TABS
ORAL_TABLET | ORAL | Status: AC
  Filled 2020-02-13: qty 1

## 2020-02-13 MED ORDER — PROCHLORPERAZINE MALEATE 10 MG PO TABS
10.0000 mg | ORAL_TABLET | Freq: Once | ORAL | Status: AC
  Administered 2020-02-13: 10 mg via ORAL

## 2020-02-13 MED ORDER — SODIUM CHLORIDE 0.9 % IV SOLN: Freq: Once | INTRAVENOUS | Status: DC

## 2020-02-13 MED ORDER — SODIUM CHLORIDE 0.9 % IV SOLN
390.0000 mg/m2 | Freq: Once | INTRAVENOUS | Status: AC
  Administered 2020-02-13: 600 mg via INTRAVENOUS
  Filled 2020-02-13: qty 20

## 2020-02-13 MED ORDER — SODIUM CHLORIDE 0.9 % IV SOLN
Freq: Once | INTRAVENOUS | Status: AC
  Filled 2020-02-13: qty 250

## 2020-02-13 MED ORDER — HEPARIN SOD (PORK) LOCK FLUSH 100 UNIT/ML IV SOLN
500.0000 [IU] | Freq: Once | INTRAVENOUS | Status: AC | PRN
  Administered 2020-02-13: 500 [IU]
  Filled 2020-02-13: qty 5

## 2020-02-13 MED ORDER — ACETAMINOPHEN 325 MG PO TABS
ORAL_TABLET | ORAL | Status: AC
  Filled 2020-02-13: qty 2

## 2020-02-13 MED ORDER — SODIUM CHLORIDE 0.9 % IV SOLN
200.0000 mg | Freq: Once | INTRAVENOUS | Status: AC
  Administered 2020-02-13: 200 mg via INTRAVENOUS
  Filled 2020-02-13: qty 8

## 2020-02-13 MED ORDER — POTASSIUM CHLORIDE IN NACL 20-0.9 MEQ/L-% IV SOLN
Freq: Once | INTRAVENOUS | Status: AC
  Filled 2020-02-13: qty 1000

## 2020-02-13 MED ORDER — POTASSIUM CHLORIDE CRYS ER 20 MEQ PO TBCR: 20.0000 meq | EXTENDED_RELEASE_TABLET | Freq: Every day | ORAL | 0 refills | Status: DC

## 2020-02-13 MED ORDER — ACETAMINOPHEN 325 MG PO TABS
650.0000 mg | ORAL_TABLET | Freq: Once | ORAL | Status: AC
  Administered 2020-02-13: 650 mg via ORAL

## 2020-02-13 MED ORDER — SODIUM CHLORIDE 0.9% FLUSH
10.0000 mL | INTRAVENOUS | Status: DC | PRN
  Administered 2020-02-13: 10 mL
  Filled 2020-02-13: qty 10

## 2020-02-13 MED ORDER — SODIUM CHLORIDE 0.9% FLUSH
10.0000 mL | Freq: Once | INTRAVENOUS | Status: AC
  Administered 2020-02-13: 10 mL
  Filled 2020-02-13: qty 10

## 2020-02-13 NOTE — Progress Notes (Signed)
CRITICAL VALUE STICKER  CRITICAL VALUE:potassium 2.8  RECEIVER (on-site recipient of call):Demetria Iwai G  DATE & TIME NOTIFIED: 02/13/20 0853  MESSENGER (representative from lab):jay  MD NOTIFIED: Cassie H/Kim S RN  TIME OF NOTIFICATION:0855  RESPONSE: MD made aware

## 2020-02-13 NOTE — Patient Instructions (Signed)
[facility] Discharge Instructions for Patients Receiving Chemotherapy  Today you received the following chemotherapy agents: Keytruda and Alimta  To help prevent nausea and vomiting after your treatment, we encourage you to take your nausea medication as directed by your MD.   If you develop nausea and vomiting that is not controlled by your nausea medication, call the clinic.   BELOW ARE SYMPTOMS THAT SHOULD BE REPORTED IMMEDIATELY:  *FEVER GREATER THAN 100.5 F  *CHILLS WITH OR WITHOUT FEVER  NAUSEA AND VOMITING THAT IS NOT CONTROLLED WITH YOUR NAUSEA MEDICATION  *UNUSUAL SHORTNESS OF BREATH  *UNUSUAL BRUISING OR BLEEDING  TENDERNESS IN MOUTH AND THROAT WITH OR WITHOUT PRESENCE OF ULCERS  *URINARY PROBLEMS  *BOWEL PROBLEMS  UNUSUAL RASH Items with * indicate a potential emergency and should be followed up as soon as possible.  Feel free to call the clinic should you have any questions or concerns. The clinic phone number is [phone].  Please show the [facility] at check-in to the Emergency Department and triage nurse.   Hypokalemia Hypokalemia means that the amount of potassium in the blood is lower than normal. Potassium is a chemical (electrolyte) that helps regulate the amount of fluid in the body. It also stimulates muscle tightening (contraction) and helps nerves work properly. Normally, most of the body's potassium is inside cells, and only a very small amount is in the blood. Because the amount in the blood is so small, minor changes to potassium levels in the blood can be life-threatening. What are the causes? This condition may be caused by:  Antibiotic medicine.  Diarrhea or vomiting. Taking too much of a medicine that helps you have a bowel movement (laxative) can cause diarrhea and lead to hypokalemia.  Chronic kidney disease (CKD).  Medicines that help the body get rid of excess fluid (diuretics).  Eating disorders, such as  bulimia.  Low magnesium levels in the body.  Sweating a lot. What are the signs or symptoms? Symptoms of this condition include:  Weakness.  Constipation.  Fatigue.  Muscle cramps.  Mental confusion.  Skipped heartbeats or irregular heartbeat (palpitations).  Tingling or numbness. How is this diagnosed? This condition is diagnosed with a blood test. How is this treated? This condition may be treated by:  Taking potassium supplements by mouth.  Adjusting the medicines that you take.  Eating more foods that contain a lot of potassium. If your potassium level is very low, you may need to get potassium through an IV and be monitored in the hospital. Follow these instructions at home:   Take over-the-counter and prescription medicines only as told by your health care provider. This includes vitamins and supplements.  Eat a healthy diet. A healthy diet includes fresh fruits and vegetables, whole grains, healthy fats, and lean proteins.  If instructed, eat more foods that contain a lot of potassium. This includes: ? Nuts, such as peanuts and pistachios. ? Seeds, such as sunflower seeds and pumpkin seeds. ? Peas, lentils, and lima beans. ? Whole grain and bran cereals and breads. ? Fresh fruits and vegetables, such as apricots, avocado, bananas, cantaloupe, kiwi, oranges, tomatoes, asparagus, and potatoes. ? Orange juice. ? Tomato juice. ? Red meats. ? Yogurt.  Keep all follow-up visits as told by your health care provider. This is important. Contact a health care provider if you:  Have weakness that gets worse.  Feel your heart pounding or racing.  Vomit.  Have diarrhea.  Have diabetes (diabetes mellitus) and  you have trouble keeping your blood sugar (glucose) in your target range. Get help right away if you:  Have chest pain.  Have shortness of breath.  Have vomiting or diarrhea that lasts for more than 2 days.  Faint. Summary  Hypokalemia means that  the amount of potassium in the blood is lower than normal.  This condition is diagnosed with a blood test.  Hypokalemia may be treated by taking potassium supplements, adjusting the medicines that you take, or eating more foods that are high in potassium.  If your potassium level is very low, you may need to get potassium through an IV and be monitored in the hospital. This information is not intended to replace advice given to you by your health care provider. Make sure you discuss any questions you have with your health care provider. Document Revised: 12/27/2017 Document Reviewed: 12/27/2017 Elsevier Patient Education  [place].

## 2020-02-13 NOTE — Progress Notes (Signed)
Ok to treat today and will give potassium IV also per Cassie H PA - patient will receive blood products on 9/18. Patient aware he must return with BB bracelet on his arm to receive the blood that day.

## 2020-02-15 ENCOUNTER — Other Ambulatory Visit: Payer: Self-pay

## 2020-02-15 ENCOUNTER — Inpatient Hospital Stay: Payer: Medicare HMO

## 2020-02-15 DIAGNOSIS — D649 Anemia, unspecified: Secondary | ICD-10-CM

## 2020-02-15 DIAGNOSIS — Z5112 Encounter for antineoplastic immunotherapy: Secondary | ICD-10-CM | POA: Diagnosis not present

## 2020-02-15 MED ORDER — SODIUM CHLORIDE 0.9% IV SOLUTION
250.0000 mL | Freq: Once | INTRAVENOUS | Status: AC
  Administered 2020-02-15: 250 mL via INTRAVENOUS
  Filled 2020-02-15: qty 250

## 2020-02-15 MED ORDER — ACETAMINOPHEN 325 MG PO TABS
650.0000 mg | ORAL_TABLET | Freq: Once | ORAL | Status: AC
  Administered 2020-02-15: 650 mg via ORAL

## 2020-02-15 MED ORDER — HEPARIN SOD (PORK) LOCK FLUSH 100 UNIT/ML IV SOLN
250.0000 [IU] | INTRAVENOUS | Status: DC | PRN
  Filled 2020-02-15: qty 5

## 2020-02-15 MED ORDER — SODIUM CHLORIDE 0.9% FLUSH
10.0000 mL | INTRAVENOUS | Status: DC | PRN
  Filled 2020-02-15: qty 10

## 2020-02-15 MED ORDER — DIPHENHYDRAMINE HCL 25 MG PO CAPS
25.0000 mg | ORAL_CAPSULE | Freq: Once | ORAL | Status: AC
  Administered 2020-02-15: 25 mg via ORAL

## 2020-02-15 NOTE — Patient Instructions (Signed)

## 2020-02-16 LAB — TYPE AND SCREEN
ABO/RH(D): O POS
Antibody Screen: NEGATIVE
Unit division: 0
Unit division: 0

## 2020-02-16 LAB — BPAM RBC
Blood Product Expiration Date: 202110142359
Blood Product Expiration Date: 202110142359
ISSUE DATE / TIME: 202109181000
ISSUE DATE / TIME: 202109181000
Unit Type and Rh: 5100
Unit Type and Rh: 5100

## 2020-02-17 ENCOUNTER — Other Ambulatory Visit: Payer: Self-pay

## 2020-02-17 ENCOUNTER — Telehealth: Payer: Self-pay | Admitting: Internal Medicine

## 2020-02-17 ENCOUNTER — Encounter: Payer: Self-pay | Admitting: Internal Medicine

## 2020-02-17 ENCOUNTER — Ambulatory Visit (INDEPENDENT_AMBULATORY_CARE_PROVIDER_SITE_OTHER): Payer: Medicare HMO | Admitting: Internal Medicine

## 2020-02-17 ENCOUNTER — Other Ambulatory Visit: Payer: Self-pay | Admitting: Internal Medicine

## 2020-02-17 VITALS — BP 110/62 | HR 61 | Temp 98.3°F | Ht 68.0 in | Wt 129.6 lb

## 2020-02-17 DIAGNOSIS — Z87891 Personal history of nicotine dependence: Secondary | ICD-10-CM

## 2020-02-17 DIAGNOSIS — R062 Wheezing: Secondary | ICD-10-CM

## 2020-02-17 DIAGNOSIS — Z7185 Encounter for immunization safety counseling: Secondary | ICD-10-CM

## 2020-02-17 DIAGNOSIS — J431 Panlobular emphysema: Secondary | ICD-10-CM | POA: Diagnosis not present

## 2020-02-17 DIAGNOSIS — J9611 Chronic respiratory failure with hypoxia: Secondary | ICD-10-CM

## 2020-02-17 DIAGNOSIS — Z7189 Other specified counseling: Secondary | ICD-10-CM

## 2020-02-17 DIAGNOSIS — J8410 Pulmonary fibrosis, unspecified: Secondary | ICD-10-CM | POA: Diagnosis not present

## 2020-02-17 DIAGNOSIS — Z85118 Personal history of other malignant neoplasm of bronchus and lung: Secondary | ICD-10-CM

## 2020-02-17 DIAGNOSIS — Z23 Encounter for immunization: Secondary | ICD-10-CM

## 2020-02-17 MED ORDER — PREDNISONE 10 MG PO TABS: ORAL_TABLET | ORAL | 0 refills | Status: DC
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
## 2020-02-17 NOTE — Addendum Note (Signed)
Addended by: Rosana Berger on: 02/17/2020 10:33 AM   Modules accepted: Orders
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
## 2020-02-17 NOTE — Telephone Encounter (Signed)
Spoke with patient regarding prior message .Advised patient I called [facility] in [place] and cancelled script that was sent in for patient. Advised patient I sent in a new script for prednisone to the correct pharmacy. Patient's voice was understanding.Nothing else further needed.

## 2020-02-17 NOTE — Telephone Encounter (Signed)
Spoke with [name] at [facility]   She states needing qualifying sats from today's visit faxed to her  I have faxed this to her at [phone] Nothing further needed

## 2020-02-17 NOTE — Telephone Encounter (Signed)
LMTCB for Katie x 1

## 2020-02-21 ENCOUNTER — Other Ambulatory Visit: Payer: Self-pay | Admitting: Internal Medicine
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
## 2020-02-26 ENCOUNTER — Encounter: Payer: Self-pay | Admitting: General Practice

## 2020-02-26 ENCOUNTER — Ambulatory Visit (INDEPENDENT_AMBULATORY_CARE_PROVIDER_SITE_OTHER): Payer: Medicare HMO | Admitting: General Practice

## 2020-02-26 ENCOUNTER — Other Ambulatory Visit: Payer: Self-pay

## 2020-02-26 VITALS — BP 130/66 | HR 102 | Ht 68.0 in | Wt 133.8 lb

## 2020-02-26 DIAGNOSIS — I950 Idiopathic hypotension: Secondary | ICD-10-CM

## 2020-02-26 DIAGNOSIS — I255 Ischemic cardiomyopathy: Secondary | ICD-10-CM | POA: Diagnosis not present

## 2020-02-26 DIAGNOSIS — E785 Hyperlipidemia, unspecified: Secondary | ICD-10-CM

## 2020-02-26 DIAGNOSIS — I2510 Atherosclerotic heart disease of native coronary artery without angina pectoris: Secondary | ICD-10-CM | POA: Diagnosis not present

## 2020-02-26 DIAGNOSIS — Z720 Tobacco use: Secondary | ICD-10-CM

## 2020-02-26 MED ORDER — CARVEDILOL 3.125 MG PO TABS: 3.1250 mg | ORAL_TABLET | Freq: Two times a day (BID) | ORAL | 6 refills | Status: DC

## 2020-02-26 NOTE — Patient Instructions (Signed)
Medication Instructions:  RESTART CARVEDILOL 3.125MG  TWICE DAILY   *If you need a refill on your cardiac medications before your next appointment, please call your pharmacy*  Lab Work:   Testing/Procedures:  NONE    NONE  Special Instructions PLEASE READ AND FOLLOW SALTY 6-ATTACHED  Follow-Up: Your next appointment:  3 month(s) In Person with Kirk Ruths, MD -[place], FNP-C   At [facility], you and your health needs are our priority.  As part of our continuing mission to provide you with exceptional heart care, we have created designated Provider Care Teams.  These Care Teams include your primary Cardiologist (physician) and Advanced Practice Providers (APPs -  Physician Assistants and Nurse Practitioners) who all work together to provide you with the care you need, when you need it.  We recommend signing up for the patient portal called "MyChart".  Sign up information is provided on this After Visit Summary.  MyChart is used to connect with patients for Virtual Visits (Telemedicine).  Patients are able to view lab/test results, encounter notes, upcoming appointments, etc.  Non-urgent messages can be sent to your provider as well.   To learn more about what you can do with MyChart, go to [email or web address].

## 2020-03-02 ENCOUNTER — Inpatient Hospital Stay: Payer: Medicare HMO

## 2020-03-02 ENCOUNTER — Inpatient Hospital Stay: Payer: Medicare HMO | Admitting: Internal Medicine

## 2020-03-02 ENCOUNTER — Telehealth: Payer: Self-pay

## 2020-03-02 ENCOUNTER — Inpatient Hospital Stay: Payer: Medicare HMO | Admitting: Nutrition

## 2020-03-02 NOTE — Telephone Encounter (Signed)
Pt is currently in the hospital. Will r/s once pt has been discharged. Will not wait 3 weeks until next visit on 03/23/2020.  LVM for pt as well to call to r/s this appt once he has been discharged.

## 2020-03-04 ENCOUNTER — Ambulatory Visit (INDEPENDENT_AMBULATORY_CARE_PROVIDER_SITE_OTHER): Payer: Medicare HMO

## 2020-03-04 DIAGNOSIS — I255 Ischemic cardiomyopathy: Secondary | ICD-10-CM

## 2020-03-05 ENCOUNTER — Telehealth: Payer: Self-pay | Admitting: Medical Oncology

## 2020-03-05 LAB — CUP PACEART REMOTE DEVICE CHECK
Battery Remaining Longevity: 44 mo
Battery Remaining Percentage: 44 %
Battery Voltage: 2.95 V
Brady Statistic RV Percent Paced: 1 %
Date Time Interrogation Session: 20211006115442
HighPow Impedance: 37 Ohm
HighPow Impedance: 37 Ohm
Implantable Lead Implant Date: 20140929
Implantable Lead Location: 753860
Implantable Lead Model: 181
Implantable Lead Serial Number: 325827
Implantable Pulse Generator Implant Date: 20140929
Lead Channel Impedance Value: 300 Ohm
Lead Channel Pacing Threshold Amplitude: 1 V
Lead Channel Pacing Threshold Pulse Width: 0.5 ms
Lead Channel Sensing Intrinsic Amplitude: 11.3 mV
Lead Channel Setting Pacing Amplitude: 2.5 V
Lead Channel Setting Pacing Pulse Width: 0.5 ms
Lead Channel Setting Sensing Sensitivity: 0.5 mV
Pulse Gen Serial Number: 7132440

## 2020-03-05 NOTE — Telephone Encounter (Signed)
Pt called and is still in [facility] with "pneumonia". He said he will call us back after he is discharged.

## 2020-03-05 NOTE — Telephone Encounter (Signed)
LVM for pt to return my call.

## 2020-03-09 NOTE — Progress Notes (Signed)
Remote ICD transmission.   

## 2020-03-12 ENCOUNTER — Telehealth: Payer: Self-pay

## 2020-03-12 ENCOUNTER — Telehealth: Payer: Self-pay | Admitting: General Practice

## 2020-03-12 NOTE — Telephone Encounter (Signed)
[employer] CSW Progress Notes  Call from patient - states he was sick and hospitalized in [place] last week, missed chemo as a result.  States he now lives in [place] and cannot get to [place] for treatment, wants to transfer his care to [place].  As CSW does not handle these types of requests, transferred call to Dr Worthy Flank desk nurse. Also messaged provider.  Edwyna Shell, LCSW Clinical Social Worker Phone:  [phone]

## 2020-03-12 NOTE — Telephone Encounter (Signed)
Pt LM indicating he has been discharged from the hospital and needs to r/s his 03/02/20 appt.   Schedule message has been sent.

## 2020-03-13 ENCOUNTER — Telehealth: Payer: Self-pay | Admitting: Internal Medicine

## 2020-03-13 NOTE — Telephone Encounter (Signed)
Scheduled appt per 10/13 sch msg - pt is aware of apt scheduled on 10/18

## 2020-03-16 ENCOUNTER — Inpatient Hospital Stay: Payer: Medicare HMO

## 2020-03-16 ENCOUNTER — Other Ambulatory Visit: Payer: Self-pay

## 2020-03-16 ENCOUNTER — Encounter: Payer: Self-pay | Admitting: Internal Medicine

## 2020-03-16 ENCOUNTER — Inpatient Hospital Stay (HOSPITAL_BASED_OUTPATIENT_CLINIC_OR_DEPARTMENT_OTHER): Payer: Medicare HMO | Admitting: Internal Medicine

## 2020-03-16 ENCOUNTER — Other Ambulatory Visit: Payer: Self-pay | Admitting: Medical Oncology

## 2020-03-16 ENCOUNTER — Inpatient Hospital Stay: Payer: Medicare HMO | Attending: Physician Assistant

## 2020-03-16 VITALS — BP 113/74 | HR 124 | Temp 98.3°F | Resp 18 | Ht 68.0 in | Wt 139.2 lb

## 2020-03-16 DIAGNOSIS — R059 Cough, unspecified: Secondary | ICD-10-CM | POA: Insufficient documentation

## 2020-03-16 DIAGNOSIS — R5383 Other fatigue: Secondary | ICD-10-CM | POA: Insufficient documentation

## 2020-03-16 DIAGNOSIS — M6281 Muscle weakness (generalized): Secondary | ICD-10-CM | POA: Insufficient documentation

## 2020-03-16 DIAGNOSIS — R630 Anorexia: Secondary | ICD-10-CM

## 2020-03-16 DIAGNOSIS — Z8546 Personal history of malignant neoplasm of prostate: Secondary | ICD-10-CM | POA: Diagnosis not present

## 2020-03-16 DIAGNOSIS — I5042 Chronic combined systolic (congestive) and diastolic (congestive) heart failure: Secondary | ICD-10-CM | POA: Insufficient documentation

## 2020-03-16 DIAGNOSIS — Z9981 Dependence on supplemental oxygen: Secondary | ICD-10-CM | POA: Insufficient documentation

## 2020-03-16 DIAGNOSIS — C3490 Malignant neoplasm of unspecified part of unspecified bronchus or lung: Secondary | ICD-10-CM

## 2020-03-16 DIAGNOSIS — Z79899 Other long term (current) drug therapy: Secondary | ICD-10-CM | POA: Insufficient documentation

## 2020-03-16 DIAGNOSIS — C3431 Malignant neoplasm of lower lobe, right bronchus or lung: Secondary | ICD-10-CM

## 2020-03-16 DIAGNOSIS — D649 Anemia, unspecified: Secondary | ICD-10-CM

## 2020-03-16 DIAGNOSIS — C3400 Malignant neoplasm of unspecified main bronchus: Secondary | ICD-10-CM

## 2020-03-16 DIAGNOSIS — R0602 Shortness of breath: Secondary | ICD-10-CM | POA: Insufficient documentation

## 2020-03-16 DIAGNOSIS — I252 Old myocardial infarction: Secondary | ICD-10-CM | POA: Insufficient documentation

## 2020-03-16 DIAGNOSIS — Z923 Personal history of irradiation: Secondary | ICD-10-CM | POA: Insufficient documentation

## 2020-03-16 DIAGNOSIS — C61 Malignant neoplasm of prostate: Secondary | ICD-10-CM | POA: Insufficient documentation

## 2020-03-16 DIAGNOSIS — J449 Chronic obstructive pulmonary disease, unspecified: Secondary | ICD-10-CM | POA: Insufficient documentation

## 2020-03-16 DIAGNOSIS — D6481 Anemia due to antineoplastic chemotherapy: Secondary | ICD-10-CM

## 2020-03-16 DIAGNOSIS — C3491 Malignant neoplasm of unspecified part of right bronchus or lung: Secondary | ICD-10-CM | POA: Diagnosis not present

## 2020-03-16 DIAGNOSIS — Z95828 Presence of other vascular implants and grafts: Secondary | ICD-10-CM

## 2020-03-16 DIAGNOSIS — E785 Hyperlipidemia, unspecified: Secondary | ICD-10-CM | POA: Insufficient documentation

## 2020-03-16 DIAGNOSIS — I110 Hypertensive heart disease with heart failure: Secondary | ICD-10-CM | POA: Diagnosis not present

## 2020-03-16 DIAGNOSIS — R918 Other nonspecific abnormal finding of lung field: Secondary | ICD-10-CM

## 2020-03-16 DIAGNOSIS — C3411 Malignant neoplasm of upper lobe, right bronchus or lung: Secondary | ICD-10-CM | POA: Insufficient documentation

## 2020-03-16 DIAGNOSIS — R0609 Other forms of dyspnea: Secondary | ICD-10-CM | POA: Insufficient documentation

## 2020-03-16 DIAGNOSIS — Z5112 Encounter for antineoplastic immunotherapy: Secondary | ICD-10-CM

## 2020-03-16 LAB — CBC WITH DIFFERENTIAL (CANCER CENTER ONLY)
Abs Immature Granulocytes: 0.07 10*3/uL (ref 0.00–0.07)
Basophils Absolute: 0 10*3/uL (ref 0.0–0.1)
Basophils Relative: 0 %
Eosinophils Absolute: 0.1 10*3/uL (ref 0.0–0.5)
Eosinophils Relative: 1 %
HCT: 24.4 % — ABNORMAL LOW (ref 39.0–52.0)
Hemoglobin: 7.7 g/dL — ABNORMAL LOW (ref 13.0–17.0)
Immature Granulocytes: 1 %
Lymphocytes Relative: 12 %
Lymphs Abs: 1.2 10*3/uL (ref 0.7–4.0)
MCH: 28.7 pg (ref 26.0–34.0)
MCHC: 31.6 g/dL (ref 30.0–36.0)
MCV: 91 fL (ref 80.0–100.0)
Monocytes Absolute: 1 10*3/uL (ref 0.1–1.0)
Monocytes Relative: 10 %
Neutro Abs: 7.7 10*3/uL (ref 1.7–7.7)
Neutrophils Relative %: 76 %
Platelet Count: 215 10*3/uL (ref 150–400)
RBC: 2.68 MIL/uL — ABNORMAL LOW (ref 4.22–5.81)
RDW: 20 % — ABNORMAL HIGH (ref 11.5–15.5)
WBC Count: 10.1 10*3/uL (ref 4.0–10.5)
nRBC: 0 % (ref 0.0–0.2)

## 2020-03-16 LAB — CMP (CANCER CENTER ONLY)
ALT: 12 U/L (ref 0–44)
AST: 12 U/L — ABNORMAL LOW (ref 15–41)
Albumin: 2.3 g/dL — ABNORMAL LOW (ref 3.5–5.0)
Alkaline Phosphatase: 62 U/L (ref 38–126)
Anion gap: 6 (ref 5–15)
BUN: 11 mg/dL (ref 6–20)
CO2: 31 mmol/L (ref 22–32)
Calcium: 8.9 mg/dL (ref 8.9–10.3)
Chloride: 105 mmol/L (ref 98–111)
Creatinine: 0.84 mg/dL (ref 0.61–1.24)
GFR, Estimated: >60 mL/min (ref >60)
Glucose, Bld: 95 mg/dL (ref 70–99)
Potassium: 3.8 mmol/L (ref 3.5–5.1)
Sodium: 142 mmol/L (ref 135–145)
Total Bilirubin: 0.3 mg/dL (ref 0.3–1.2)
Total Protein: 6.9 g/dL (ref 6.5–8.1)

## 2020-03-16 LAB — PREPARE RBC (CROSSMATCH)

## 2020-03-16 MED ORDER — SODIUM CHLORIDE 0.9% FLUSH
10.0000 mL | INTRAVENOUS | Status: AC | PRN
  Administered 2020-03-16: 10 mL
  Filled 2020-03-16: qty 10

## 2020-03-16 MED ORDER — SODIUM CHLORIDE 0.9% FLUSH
10.0000 mL | Freq: Once | INTRAVENOUS | Status: AC
  Administered 2020-03-16: 10 mL
  Filled 2020-03-16: qty 10

## 2020-03-16 MED ORDER — HEPARIN SOD (PORK) LOCK FLUSH 100 UNIT/ML IV SOLN
500.0000 [IU] | Freq: Every day | INTRAVENOUS | Status: AC | PRN
  Administered 2020-03-16: 500 [IU]
  Filled 2020-03-16: qty 5

## 2020-03-16 MED ORDER — DIPHENHYDRAMINE HCL 25 MG PO CAPS
25.0000 mg | ORAL_CAPSULE | Freq: Once | ORAL | Status: AC
  Administered 2020-03-16: 25 mg via ORAL

## 2020-03-16 MED ORDER — DIPHENHYDRAMINE HCL 25 MG PO CAPS
ORAL_CAPSULE | ORAL | Status: AC
  Filled 2020-03-16: qty 1

## 2020-03-16 MED ORDER — ACETAMINOPHEN 325 MG PO TABS
ORAL_TABLET | ORAL | Status: AC
  Filled 2020-03-16: qty 2

## 2020-03-16 MED ORDER — SODIUM CHLORIDE 0.9% IV SOLUTION
250.0000 mL | Freq: Once | INTRAVENOUS | Status: AC
  Filled 2020-03-16: qty 250

## 2020-03-16 MED ORDER — ACETAMINOPHEN 325 MG PO TABS
650.0000 mg | ORAL_TABLET | Freq: Once | ORAL | Status: AC
  Administered 2020-03-16: 650 mg via ORAL

## 2020-03-16 NOTE — Patient Instructions (Signed)
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
## 2020-03-16 NOTE — Patient Instructions (Signed)

## 2020-03-17 ENCOUNTER — Other Ambulatory Visit: Payer: Self-pay

## 2020-03-17 ENCOUNTER — Telehealth: Payer: Self-pay | Admitting: Internal Medicine

## 2020-03-17 ENCOUNTER — Inpatient Hospital Stay: Payer: Medicare HMO

## 2020-03-17 DIAGNOSIS — C3400 Malignant neoplasm of unspecified main bronchus: Secondary | ICD-10-CM

## 2020-03-17 DIAGNOSIS — D649 Anemia, unspecified: Secondary | ICD-10-CM

## 2020-03-17 LAB — PREPARE RBC (CROSSMATCH)

## 2020-03-17 MED ORDER — ACETAMINOPHEN 325 MG PO TABS
650.0000 mg | ORAL_TABLET | Freq: Once | ORAL | Status: AC
  Administered 2020-03-17: 650 mg via ORAL

## 2020-03-17 MED ORDER — SODIUM CHLORIDE 0.9% FLUSH
10.0000 mL | INTRAVENOUS | Status: AC | PRN
  Administered 2020-03-17: 10 mL
  Filled 2020-03-17: qty 10

## 2020-03-17 MED ORDER — DIPHENHYDRAMINE HCL 25 MG PO CAPS
ORAL_CAPSULE | ORAL | Status: AC
  Filled 2020-03-17: qty 1

## 2020-03-17 MED ORDER — SODIUM CHLORIDE 0.9% IV SOLUTION
250.0000 mL | Freq: Once | INTRAVENOUS | Status: AC
  Administered 2020-03-17: 250 mL via INTRAVENOUS
  Filled 2020-03-17: qty 250

## 2020-03-17 MED ORDER — ACETAMINOPHEN 325 MG PO TABS
ORAL_TABLET | ORAL | Status: AC
  Filled 2020-03-17: qty 2

## 2020-03-17 MED ORDER — HEPARIN SOD (PORK) LOCK FLUSH 100 UNIT/ML IV SOLN
500.0000 [IU] | Freq: Every day | INTRAVENOUS | Status: AC | PRN
  Administered 2020-03-17: 500 [IU]
  Filled 2020-03-17: qty 5

## 2020-03-17 MED ORDER — DIPHENHYDRAMINE HCL 25 MG PO CAPS
25.0000 mg | ORAL_CAPSULE | Freq: Once | ORAL | Status: AC
  Administered 2020-03-17: 25 mg via ORAL

## 2020-03-17 NOTE — Patient Instructions (Signed)

## 2020-03-17 NOTE — Telephone Encounter (Signed)
ERROR

## 2020-03-17 NOTE — Telephone Encounter (Signed)
Scheduled per los. Called and spoke with patient. Confirmed appt 

## 2020-03-18 ENCOUNTER — Other Ambulatory Visit: Payer: Self-pay

## 2020-03-18 ENCOUNTER — Encounter (HOSPITAL_COMMUNITY): Payer: Self-pay

## 2020-03-18 ENCOUNTER — Emergency Department (HOSPITAL_COMMUNITY): Payer: Medicare HMO

## 2020-03-18 ENCOUNTER — Inpatient Hospital Stay (HOSPITAL_COMMUNITY): Payer: Medicare HMO

## 2020-03-18 ENCOUNTER — Telehealth: Payer: Self-pay | Admitting: Physician Assistant

## 2020-03-18 ENCOUNTER — Inpatient Hospital Stay (HOSPITAL_COMMUNITY)
Admission: EM | Admit: 2020-03-18 | Discharge: 2020-04-10 | DRG: 291 | Disposition: A | Discharge status: Skilled Nursing Facility | Payer: Medicare HMO | Attending: Family Medicine | Admitting: Family Medicine

## 2020-03-18 DIAGNOSIS — I952 Hypotension due to drugs: Secondary | ICD-10-CM | POA: Diagnosis not present

## 2020-03-18 DIAGNOSIS — F32A Depression, unspecified: Secondary | ICD-10-CM | POA: Diagnosis present

## 2020-03-18 DIAGNOSIS — F419 Anxiety disorder, unspecified: Secondary | ICD-10-CM | POA: Diagnosis present

## 2020-03-18 DIAGNOSIS — Z87891 Personal history of nicotine dependence: Secondary | ICD-10-CM

## 2020-03-18 DIAGNOSIS — Z923 Personal history of irradiation: Secondary | ICD-10-CM

## 2020-03-18 DIAGNOSIS — Z515 Encounter for palliative care: Secondary | ICD-10-CM | POA: Diagnosis not present

## 2020-03-18 DIAGNOSIS — R0600 Dyspnea, unspecified: Secondary | ICD-10-CM

## 2020-03-18 DIAGNOSIS — Z9981 Dependence on supplemental oxygen: Secondary | ICD-10-CM

## 2020-03-18 DIAGNOSIS — E46 Unspecified protein-calorie malnutrition: Secondary | ICD-10-CM | POA: Diagnosis present

## 2020-03-18 DIAGNOSIS — J9621 Acute and chronic respiratory failure with hypoxia: Secondary | ICD-10-CM | POA: Diagnosis present

## 2020-03-18 DIAGNOSIS — M545 Low back pain, unspecified: Secondary | ICD-10-CM | POA: Diagnosis not present

## 2020-03-18 DIAGNOSIS — C61 Malignant neoplasm of prostate: Secondary | ICD-10-CM | POA: Diagnosis present

## 2020-03-18 DIAGNOSIS — Z7982 Long term (current) use of aspirin: Secondary | ICD-10-CM | POA: Diagnosis not present

## 2020-03-18 DIAGNOSIS — I252 Old myocardial infarction: Secondary | ICD-10-CM | POA: Diagnosis not present

## 2020-03-18 DIAGNOSIS — R918 Other nonspecific abnormal finding of lung field: Secondary | ICD-10-CM | POA: Diagnosis present

## 2020-03-18 DIAGNOSIS — E875 Hyperkalemia: Secondary | ICD-10-CM | POA: Diagnosis not present

## 2020-03-18 DIAGNOSIS — I110 Hypertensive heart disease with heart failure: Secondary | ICD-10-CM | POA: Diagnosis present

## 2020-03-18 DIAGNOSIS — C3431 Malignant neoplasm of lower lobe, right bronchus or lung: Secondary | ICD-10-CM | POA: Diagnosis present

## 2020-03-18 DIAGNOSIS — Z7951 Long term (current) use of inhaled steroids: Secondary | ICD-10-CM | POA: Diagnosis not present

## 2020-03-18 DIAGNOSIS — Z66 Do not resuscitate: Secondary | ICD-10-CM | POA: Diagnosis not present

## 2020-03-18 DIAGNOSIS — Z8611 Personal history of tuberculosis: Secondary | ICD-10-CM

## 2020-03-18 DIAGNOSIS — I255 Ischemic cardiomyopathy: Secondary | ICD-10-CM | POA: Diagnosis present

## 2020-03-18 DIAGNOSIS — C3491 Malignant neoplasm of unspecified part of right bronchus or lung: Secondary | ICD-10-CM | POA: Diagnosis not present

## 2020-03-18 DIAGNOSIS — Z20822 Contact with and (suspected) exposure to covid-19: Secondary | ICD-10-CM | POA: Diagnosis present

## 2020-03-18 DIAGNOSIS — I5043 Acute on chronic combined systolic (congestive) and diastolic (congestive) heart failure: Secondary | ICD-10-CM | POA: Diagnosis present

## 2020-03-18 DIAGNOSIS — R0602 Shortness of breath: Secondary | ICD-10-CM

## 2020-03-18 DIAGNOSIS — Z7952 Long term (current) use of systemic steroids: Secondary | ICD-10-CM

## 2020-03-18 DIAGNOSIS — Z8249 Family history of ischemic heart disease and other diseases of the circulatory system: Secondary | ICD-10-CM

## 2020-03-18 DIAGNOSIS — Z79899 Other long term (current) drug therapy: Secondary | ICD-10-CM

## 2020-03-18 DIAGNOSIS — J449 Chronic obstructive pulmonary disease, unspecified: Secondary | ICD-10-CM | POA: Diagnosis present

## 2020-03-18 DIAGNOSIS — E785 Hyperlipidemia, unspecified: Secondary | ICD-10-CM | POA: Diagnosis present

## 2020-03-18 DIAGNOSIS — G8929 Other chronic pain: Secondary | ICD-10-CM | POA: Diagnosis not present

## 2020-03-18 DIAGNOSIS — R627 Adult failure to thrive: Secondary | ICD-10-CM | POA: Diagnosis present

## 2020-03-18 DIAGNOSIS — E876 Hypokalemia: Secondary | ICD-10-CM

## 2020-03-18 DIAGNOSIS — Z951 Presence of aortocoronary bypass graft: Secondary | ICD-10-CM

## 2020-03-18 DIAGNOSIS — Z95810 Presence of automatic (implantable) cardiac defibrillator: Secondary | ICD-10-CM

## 2020-03-18 DIAGNOSIS — J438 Other emphysema: Secondary | ICD-10-CM | POA: Diagnosis present

## 2020-03-18 DIAGNOSIS — I2510 Atherosclerotic heart disease of native coronary artery without angina pectoris: Secondary | ICD-10-CM | POA: Diagnosis present

## 2020-03-18 DIAGNOSIS — E43 Unspecified severe protein-calorie malnutrition: Secondary | ICD-10-CM | POA: Diagnosis not present

## 2020-03-18 DIAGNOSIS — D6481 Anemia due to antineoplastic chemotherapy: Secondary | ICD-10-CM | POA: Diagnosis present

## 2020-03-18 DIAGNOSIS — I25810 Atherosclerosis of coronary artery bypass graft(s) without angina pectoris: Secondary | ICD-10-CM | POA: Diagnosis not present

## 2020-03-18 DIAGNOSIS — J431 Panlobular emphysema: Secondary | ICD-10-CM | POA: Diagnosis not present

## 2020-03-18 LAB — COMPREHENSIVE METABOLIC PANEL
ALT: 18 U/L (ref 0–44)
AST: 17 U/L (ref 15–41)
Albumin: 2.5 g/dL — ABNORMAL LOW (ref 3.5–5.0)
Alkaline Phosphatase: 61 U/L (ref 38–126)
Anion gap: 9 (ref 5–15)
BUN: 17 mg/dL (ref 6–20)
CO2: 25 mmol/L (ref 22–32)
Calcium: 8.7 mg/dL — ABNORMAL LOW (ref 8.9–10.3)
Chloride: 105 mmol/L (ref 98–111)
Creatinine, Ser: 1.1 mg/dL (ref 0.61–1.24)
GFR, Estimated: >60 mL/min (ref >60)
Glucose, Bld: 113 mg/dL — ABNORMAL HIGH (ref 70–99)
Potassium: 4.9 mmol/L (ref 3.5–5.1)
Sodium: 139 mmol/L (ref 135–145)
Total Bilirubin: 0.5 mg/dL (ref 0.3–1.2)
Total Protein: 7 g/dL (ref 6.5–8.1)

## 2020-03-18 LAB — BPAM RBC
Blood Product Expiration Date: 202111182359
Blood Product Expiration Date: 202111182359
ISSUE DATE / TIME: 202110181532
ISSUE DATE / TIME: 202110191019
Unit Type and Rh: 5100
Unit Type and Rh: 5100

## 2020-03-18 LAB — CBC WITH DIFFERENTIAL/PLATELET
Abs Immature Granulocytes: 0.06 10*3/uL (ref 0.00–0.07)
Basophils Absolute: 0 10*3/uL (ref 0.0–0.1)
Basophils Relative: 0 %
Eosinophils Absolute: 0.1 10*3/uL (ref 0.0–0.5)
Eosinophils Relative: 1 %
HCT: 34.2 % — ABNORMAL LOW (ref 39.0–52.0)
Hemoglobin: 10.2 g/dL — ABNORMAL LOW (ref 13.0–17.0)
Immature Granulocytes: 1 %
Lymphocytes Relative: 15 %
Lymphs Abs: 1.5 10*3/uL (ref 0.7–4.0)
MCH: 28.1 pg (ref 26.0–34.0)
MCHC: 29.8 g/dL — ABNORMAL LOW (ref 30.0–36.0)
MCV: 94.2 fL (ref 80.0–100.0)
Monocytes Absolute: 0.9 10*3/uL (ref 0.1–1.0)
Monocytes Relative: 9 %
Neutro Abs: 7.4 10*3/uL (ref 1.7–7.7)
Neutrophils Relative %: 74 %
Platelets: 215 10*3/uL (ref 150–400)
RBC: 3.63 MIL/uL — ABNORMAL LOW (ref 4.22–5.81)
RDW: 19.4 % — ABNORMAL HIGH (ref 11.5–15.5)
WBC: 10 10*3/uL (ref 4.0–10.5)
nRBC: 0 % (ref 0.0–0.2)

## 2020-03-18 LAB — CBC
HCT: 35.2 % — ABNORMAL LOW (ref 39.0–52.0)
Hemoglobin: 10.7 g/dL — ABNORMAL LOW (ref 13.0–17.0)
MCH: 28.5 pg (ref 26.0–34.0)
MCHC: 30.4 g/dL (ref 30.0–36.0)
MCV: 93.9 fL (ref 80.0–100.0)
Platelets: 197 10*3/uL (ref 150–400)
RBC: 3.75 MIL/uL — ABNORMAL LOW (ref 4.22–5.81)
RDW: 19.4 % — ABNORMAL HIGH (ref 11.5–15.5)
WBC: 9.3 10*3/uL (ref 4.0–10.5)
nRBC: 0.4 % — ABNORMAL HIGH (ref 0.0–0.2)

## 2020-03-18 LAB — TSH: TSH: 1.179 u[IU]/mL (ref 0.350–4.500)

## 2020-03-18 LAB — TROPONIN I (HIGH SENSITIVITY)
Troponin I (High Sensitivity): 51 ng/L — ABNORMAL HIGH (ref <18)
Troponin I (High Sensitivity): 63 ng/L — ABNORMAL HIGH (ref <18)

## 2020-03-18 LAB — TYPE AND SCREEN
ABO/RH(D): O POS
Antibody Screen: NEGATIVE
Unit division: 0
Unit division: 0

## 2020-03-18 LAB — LACTIC ACID, PLASMA
Lactic Acid, Venous: 0.6 mmol/L (ref 0.5–1.9)
Lactic Acid, Venous: 0.6 mmol/L (ref 0.5–1.9)

## 2020-03-18 LAB — RESPIRATORY PANEL BY RT PCR (FLU A&B, COVID)
Influenza A by PCR: NEGATIVE
Influenza B by PCR: NEGATIVE
SARS Coronavirus 2 by RT PCR: NEGATIVE

## 2020-03-18 LAB — CREATININE, SERUM
Creatinine, Ser: 1.05 mg/dL (ref 0.61–1.24)
GFR, Estimated: >60 mL/min (ref >60)

## 2020-03-18 LAB — ECHOCARDIOGRAM COMPLETE
Area-P 1/2: 4.68 cm2
MV M vel: 4.1 m/s
MV Peak grad: 67.2 mmHg
S' Lateral: 5.2 cm

## 2020-03-18 LAB — MAGNESIUM: Magnesium: 1.2 mg/dL — ABNORMAL LOW (ref 1.7–2.4)

## 2020-03-18 LAB — BRAIN NATRIURETIC PEPTIDE: B Natriuretic Peptide: 2750.7 pg/mL — ABNORMAL HIGH (ref 0.0–100.0)

## 2020-03-18 LAB — HIV ANTIBODY (ROUTINE TESTING W REFLEX): HIV Screen 4th Generation wRfx: NONREACTIVE

## 2020-03-18 MED ORDER — ENOXAPARIN SODIUM 40 MG/0.4ML ~~LOC~~ SOLN
40.0000 mg | SUBCUTANEOUS | Status: DC
  Administered 2020-03-18 – 2020-04-09 (×23): 40 mg via SUBCUTANEOUS
  Filled 2020-03-18 (×23): qty 0.4

## 2020-03-18 MED ORDER — UMECLIDINIUM BROMIDE 62.5 MCG/INH IN AEPB
1.0000 | INHALATION_SPRAY | Freq: Every day | RESPIRATORY_TRACT | Status: DC
  Administered 2020-03-19 – 2020-04-10 (×16): 1 via RESPIRATORY_TRACT
  Filled 2020-03-18 (×2): qty 7

## 2020-03-18 MED ORDER — SODIUM CHLORIDE 0.9% FLUSH
3.0000 mL | Freq: Two times a day (BID) | INTRAVENOUS | Status: DC
  Administered 2020-03-18 – 2020-04-01 (×22): 3 mL via INTRAVENOUS

## 2020-03-18 MED ORDER — SACUBITRIL-VALSARTAN 24-26 MG PO TABS
1.0000 | ORAL_TABLET | Freq: Two times a day (BID) | ORAL | Status: DC
  Administered 2020-03-18 – 2020-03-22 (×9): 1 via ORAL
  Filled 2020-03-18 (×12): qty 1

## 2020-03-18 MED ORDER — FUROSEMIDE 10 MG/ML IJ SOLN
40.0000 mg | Freq: Two times a day (BID) | INTRAMUSCULAR | Status: DC
  Filled 2020-03-18: qty 4

## 2020-03-18 MED ORDER — ATORVASTATIN CALCIUM 40 MG PO TABS
40.0000 mg | ORAL_TABLET | Freq: Every day | ORAL | Status: DC
  Administered 2020-03-18 – 2020-04-04 (×18): 40 mg via ORAL
  Filled 2020-03-18 (×18): qty 1

## 2020-03-18 MED ORDER — FOLIC ACID 1 MG PO TABS
1.0000 mg | ORAL_TABLET | Freq: Every day | ORAL | Status: DC
  Administered 2020-03-18 – 2020-04-04 (×18): 1 mg via ORAL
  Filled 2020-03-18 (×18): qty 1

## 2020-03-18 MED ORDER — SODIUM CHLORIDE 0.9 % IV SOLN: 250.0000 mL | INTRAVENOUS | Status: DC | PRN

## 2020-03-18 MED ORDER — SODIUM CHLORIDE 0.9% FLUSH
3.0000 mL | INTRAVENOUS | Status: DC | PRN
  Administered 2020-03-18 – 2020-03-20 (×2): 3 mL via INTRAVENOUS

## 2020-03-18 MED ORDER — BUPROPION HCL ER (SR) 150 MG PO TB12
150.0000 mg | ORAL_TABLET | Freq: Every day | ORAL | Status: DC
  Administered 2020-03-18 – 2020-04-10 (×24): 150 mg via ORAL
  Filled 2020-03-18 (×25): qty 1

## 2020-03-18 MED ORDER — PROCHLORPERAZINE MALEATE 10 MG PO TABS
10.0000 mg | ORAL_TABLET | Freq: Four times a day (QID) | ORAL | Status: DC | PRN
  Filled 2020-03-18: qty 1

## 2020-03-18 MED ORDER — MOMETASONE FURO-FORMOTEROL FUM 200-5 MCG/ACT IN AERO
2.0000 | INHALATION_SPRAY | Freq: Two times a day (BID) | RESPIRATORY_TRACT | Status: DC
  Administered 2020-03-19 – 2020-04-10 (×44): 2 via RESPIRATORY_TRACT
  Filled 2020-03-18: qty 8.8

## 2020-03-18 MED ORDER — FUROSEMIDE 10 MG/ML IJ SOLN
80.0000 mg | Freq: Two times a day (BID) | INTRAMUSCULAR | Status: DC
  Administered 2020-03-18 – 2020-03-21 (×6): 80 mg via INTRAVENOUS
  Filled 2020-03-18 (×6): qty 8

## 2020-03-18 MED ORDER — OXYCODONE HCL 5 MG PO TABS
10.0000 mg | ORAL_TABLET | Freq: Once | ORAL | Status: AC
  Administered 2020-03-18: 10 mg via ORAL
  Filled 2020-03-18: qty 2

## 2020-03-18 MED ORDER — FUROSEMIDE 10 MG/ML IJ SOLN
60.0000 mg | Freq: Once | INTRAMUSCULAR | Status: AC
  Administered 2020-03-18: 60 mg via INTRAVENOUS
  Filled 2020-03-18: qty 6

## 2020-03-18 MED ORDER — PERFLUTREN LIPID MICROSPHERE
1.0000 mL | INTRAVENOUS | Status: AC | PRN
  Administered 2020-03-18: 3 mL via INTRAVENOUS
  Filled 2020-03-18: qty 10

## 2020-03-18 MED ORDER — ACETAMINOPHEN 325 MG PO TABS
650.0000 mg | ORAL_TABLET | ORAL | Status: DC | PRN
  Administered 2020-03-20 – 2020-03-29 (×4): 650 mg via ORAL
  Filled 2020-03-18 (×4): qty 2

## 2020-03-18 MED ORDER — IPRATROPIUM BROMIDE 0.02 % IN SOLN: 3.0000 mL | Freq: Four times a day (QID) | RESPIRATORY_TRACT | Status: DC | PRN

## 2020-03-18 MED ORDER — ASPIRIN EC 81 MG PO TBEC
81.0000 mg | DELAYED_RELEASE_TABLET | Freq: Every day | ORAL | Status: DC
  Administered 2020-03-18 – 2020-04-04 (×18): 81 mg via ORAL
  Filled 2020-03-18 (×20): qty 1

## 2020-03-18 MED ORDER — ONDANSETRON HCL 4 MG/2ML IJ SOLN: 4.0000 mg | Freq: Four times a day (QID) | INTRAMUSCULAR | Status: DC | PRN

## 2020-03-18 NOTE — Plan of Care (Signed)
  Problem: Activity: Goal: Risk for activity intolerance will decrease Outcome: Progressing   Problem: Pain Managment: Goal: General experience of comfort will improve Outcome: Progressing   Problem: Safety: Goal: Ability to remain free from injury will improve Outcome: Progressing   
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
## 2020-03-18 NOTE — Telephone Encounter (Signed)
   Pt called, he is SOB. Wt up 20 lbs, does not eat low Na diet +LE edema, +orthopnea and PND, now SOB at rest. Got IVF/blood yesterday.  Recommended he come to [facility] for eval.  Dr Stanford Breed is MD  Rosaria Ferries, PA-C 03/18/2020 7:52 AM
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
## 2020-03-18 NOTE — ED Triage Notes (Signed)
Pt BIB [facility], hx of Lung Ca and COPD, presents with ongoing SOB x2 weeks d/t PNA. Pt using at home O2 with no relief. Placed pt on a simple mask which seemed to help him be able to breathe better   20g LAC  BP142/81 HR 115-120 94% 8L SM

## 2020-03-18 NOTE — ED Notes (Signed)
Got patient undressed on the monitor did ekg shown to er doctor patient is resting with call bell in reach
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
## 2020-03-18 NOTE — Progress Notes (Addendum)
Pt arrived to the floor. A/Ox4. VSS; tachycardic on admission. Assessment completed. Admission completed. Remains on 4-5LNC increase need with ambulation. Takes pills whole; MD ordered Oxy 10mg  for severe pain in R shoulder. Will continue to monitor.

## 2020-03-18 NOTE — Progress Notes (Signed)
  Echocardiogram 2D Echocardiogram has been performed.  [name] 03/18/2020, 2:10 PM

## 2020-03-19 DIAGNOSIS — J9621 Acute and chronic respiratory failure with hypoxia: Secondary | ICD-10-CM | POA: Diagnosis not present

## 2020-03-19 DIAGNOSIS — I5043 Acute on chronic combined systolic (congestive) and diastolic (congestive) heart failure: Secondary | ICD-10-CM | POA: Diagnosis not present

## 2020-03-19 LAB — BASIC METABOLIC PANEL
Anion gap: 6 (ref 5–15)
BUN: 16 mg/dL (ref 6–20)
CO2: 34 mmol/L — ABNORMAL HIGH (ref 22–32)
Calcium: 8.5 mg/dL — ABNORMAL LOW (ref 8.9–10.3)
Chloride: 99 mmol/L (ref 98–111)
Creatinine, Ser: 1.21 mg/dL (ref 0.61–1.24)
GFR, Estimated: >60 mL/min (ref >60)
Glucose, Bld: 132 mg/dL — ABNORMAL HIGH (ref 70–99)
Potassium: 4.3 mmol/L (ref 3.5–5.1)
Sodium: 139 mmol/L (ref 135–145)

## 2020-03-19 MED ORDER — HYDROCODONE-ACETAMINOPHEN 5-325 MG PO TABS
1.0000 | ORAL_TABLET | ORAL | Status: AC | PRN
  Administered 2020-03-19 – 2020-03-20 (×2): 2 via ORAL
  Filled 2020-03-19 (×2): qty 2

## 2020-03-19 NOTE — Progress Notes (Signed)
Paged MD regarding giving or holding scheduled IV Lasix 80 with BP being soft (90/40's some times SBP in 80's); MD stated OK to give. Will continue to monitor.

## 2020-03-19 NOTE — Progress Notes (Signed)
Patient had a yellow MEWS earlier today. He has a current yellow MEWS due to his systolic bp 95 and heart rate  101. He remains asymptomatic. Continuing to assess VS and status every 4 hours per MEWS protocol.

## 2020-03-19 NOTE — Progress Notes (Signed)
Pt does not want Bipap at this time.

## 2020-03-19 NOTE — Progress Notes (Signed)
[facility] PROGRESS NOTE    Progress Note  [name]  TIW:[id number] DOB: Dec 30, 1960 DOA: 03/18/2020 PCP: [name], MD     Brief Narrative:   [name] is an 59 y.o. male past medical history significant for coronary artery disease status post CABG, ischemic cardiomyopathy with an EF of 20% status post AICD placement, chronic hypoxic respiratory failure secondary to COPD on 4 L of oxygen, stage Ia non-small cell cancer diagnosed in February 2015 status post right lower lobe wedge resection as well as SBRT with a recurrent disease to the right lower lobe in June 2019 which has been in observation since then. Stage T1 adenocarcinoma of the prostate with a Gleason score of 4 diagnosed in November 2019 with a confirmed PET scan and biopsy of the right lower mass consistent with adenocarcinoma currently on chemotherapy, chemotherapy-induced anemia status post transfusion presents to the emergency room with shortness of breath for 2 weeks associated with lower extremity edema, orthopnea PND and a 20 pound weight gain, BNP of 2700 bilateral pleural effusions with pulmonary edema.  Assessment/Plan:   Acute on chronic combined systolic and diastolic CHF (congestive heart failure) ([place]): Usually weighs like around 50 to 53 kg on admission he was 61 kg Requiring BiPAP on admission he was started on IV Lasix. He is negative about 1200 cc. I agree with IV Lasix twice a day 80 mg, continue to monitor and replete electrolytes. 2D echo was done on admission that showed an EF less than 20% severely dilated left ventricle with diffuse severe hypokinesia with akinesia of the distal apical segment. There is been a mild Garrett in his creatinine, baseline creatinine is less than 1, will continue IV diuresis.  Coronary artery disease: Currently chest pain-free. Continue aspirin and statins.  Essential hypertension: Continue Entresto and IV Lasix pressure seems to be holding  steady.  Hyperlipidemia: Continue statins.  Prostate adenocarcinoma of the right lung: Follow-up with Dr. [name] for chemotherapy as an outpatient.  COPD (chronic obstructive pulmonary disease) ([place]): Can continue inhalers currently no wheezing.  Depression/anxiety: Continue Wellbutrin.    DVT prophylaxis: lovenxo Family Communication:none Status is: Inpatient  Remains inpatient appropriate because:Hemodynamically unstable   Dispo: The patient is from: Home              Anticipated d/c is to: Home              Anticipated d/c date is: > 3 days              Patient currently is not medically stable to d/c.        Code Status:     Code Status Orders  (From admission, onward)         Start     Ordered   03/18/20 1126  Full code  Continuous        03/18/20 1126        Code Status History    Date Active Date Inactive Code Status Order ID Comments User Context   12/12/2018 2034 12/14/2018 1518 Full Code [id number]  Garrett Bulls, MD Inpatient   10/24/2017 0140 10/26/2017 1934 Full Code [id number]  Garrett Costa, MD ED   03/23/2017 0429 03/26/2017 1710 Full Code [id number]  Garrett Patience, MD ED   06/15/2015 0357 06/17/2015 1854 Full Code [id number]  Garrett Milan, MD ED   08/07/2013 1900 08/16/2013 1737 Full Code [id number]  Garrett Skillern, PA-C Inpatient   07/18/2013 1253 07/19/2013 0343 Full Code [id number]  Garrett Cadet, MD [facility]   Advance Care Planning Activity        IV Access:    Peripheral IV   Procedures and diagnostic studies:   DG Chest Portable 1 View  Result Date: 03/18/2020 CLINICAL DATA:  Shortness of breath, lung cancer.  Swelling in legs. EXAM: PORTABLE CHEST 1 VIEW COMPARISON:  Chest radiograph 03/05/2019.  Chest CT 01/06/2020 FINDINGS: Similar opacification left lung apex with interspersed lucencies, characterized as bronchiectasis, chronic lung disease, and probable large aspirate alone a on prior CT. Right lower lobe mass  was better characterized on recent CT. There is new mild diffuse interstitial prominence, suggestive of mild interstitial edema. Pulmonary vascular congestion. Suspected small bilateral pleural effusions. No discernible pneumothorax. Right Port-A-Cath with the tip projecting at the cavoatrial junction. Left subclavian approach cardiac rhythm maintenance device. IMPRESSION: 1. New mild diffuse interstitial prominence, pulmonary vascular congestion, and suspected small bilateral pleural effusions, possibly related to mild pulmonary edema. Atypical pneumonia is a consideration. 2. Right lower lobe mass was better characterized on recent CT. 3. Similar chronic changes in the left lung apex, as detailed above. Electronically Signed   By: Margaretha Sheffield MD   On: 03/18/2020 09:38   ECHOCARDIOGRAM COMPLETE  Result Date: 03/18/2020    ECHOCARDIOGRAM REPORT   Patient Name:   [name] Date of Exam: 03/18/2020 Medical Rec #:  [id number]     Height:       68.0 in Accession #:    [id number]    Weight:       140.5 lb Date of Birth:  06/12/1960     BSA:          1.759 m Patient Age:    88 years      BP:           132/76 mmHg Patient Gender: M             HR:           115 bpm. Exam Location:  Inpatient Procedure: 2D Echo Indications:    CHF 428  History:        Patient has prior history of Echocardiogram examinations, most                 recent 03/01/2018. CHF and Cardiomyopathy, CAD and Previous                 Myocardial Infarction, Defibrillator and Prior CABG, COPD; Risk                 Factors:Dyslipidemia and Former Smoker. AICD. STEMI.  Sonographer:    Jannett Celestine RDCS (AE) Referring Phys: [id number] Michell Heinrich PAHWANI IMPRESSIONS  1. Left ventricular ejection fraction, by estimation, is <20%. The left ventricle has severely decreased function. The left ventricle demonstrates regional wall motion abnormalities (see scoring diagram/findings for description). The left ventricular internal cavity size was moderately to  severely dilated. There is mild concentric left ventricular hypertrophy. Left ventricular diastolic function could not be evaluated. There is diffuse severe hypokinesis with akinesis of the distal to apical segments  of all walls.  2. Right ventricular systolic function was not well visualized. The right ventricular size is not well visualized.  3. The mitral valve is normal in structure. Mild to moderate mitral valve regurgitation.  4. The aortic valve is grossly normal. Aortic valve regurgitation is mild. No aortic stenosis is present. Comparison(s): Prior images unable to be directly viewed, comparison made by report only. Conclusion(s)/Recommendation(s): Severely reduced LVEF with  both global and focal wall motion abnormalities. FINDINGS  Left Ventricle: LV apical thrombus excluded by contrast. Left ventricular ejection fraction, by estimation, is <20%. The left ventricle has severely decreased function. The left ventricle demonstrates regional wall motion abnormalities. The left ventricular internal cavity size was moderately to severely dilated. There is mild concentric left ventricular hypertrophy. Left ventricular diastolic function could not be evaluated.  LV Wall Scoring: There is diffuse severe hypokinesis with akinesis of the distal to apical segments of all walls. Right Ventricle: The right ventricular size is not well visualized. Right vetricular wall thickness was not assessed. Right ventricular systolic function was not well visualized. Left Atrium: Left atrial size was not assessed. Right Atrium: Right atrial size was not assessed. Pericardium: There is no evidence of pericardial effusion. Mitral Valve: The mitral valve is normal in structure. Mild to moderate mitral valve regurgitation. Tricuspid Valve: The tricuspid valve is normal in structure. Tricuspid valve regurgitation is trivial. No evidence of tricuspid stenosis. Aortic Valve: The aortic valve is grossly normal. Aortic valve regurgitation  is mild. No aortic stenosis is present. Pulmonic Valve: The pulmonic valve was grossly normal. Pulmonic valve regurgitation is not visualized. Aorta: The aortic root is normal in size and structure. Venous: IVC assessment for right atrial pressure unable to be performed due to mechanical ventilation. IAS/Shunts: The interatrial septum was not assessed. Additional Comments: A pacer wire is visualized in the right atrium and right ventricle.  LEFT VENTRICLE PLAX 2D LVIDd:         6.20 cm LVIDs:         5.20 cm LV PW:         1.20 cm LV IVS:        1.10 cm LVOT diam:     2.10 cm LV SV:         30 LV SV Index:   17 LVOT Area:     3.46 cm  LEFT ATRIUM         Index LA diam:    3.80 cm 2.16 cm/m  AORTIC VALVE LVOT Vmax:   59.70 cm/s LVOT Vmean:  44.200 cm/s LVOT VTI:    0.087 m  AORTA Ao Root diam: 3.30 cm MITRAL VALVE MV Area (PHT): 4.68 cm    SHUNTS MV Decel Time: 162 msec    Systemic VTI:  0.09 m MR Peak grad: 67.2 mmHg    Systemic Diam: 2.10 cm MR Mean grad: 41.0 mmHg MR Vmax:      410.00 cm/s MR Vmean:     300.0 cm/s MV E velocity: 85.10 cm/s MV A velocity: 60.60 cm/s MV E/A ratio:  1.40 Buford Dresser MD Electronically signed by Buford Dresser MD Signature Date/Time: 03/18/2020/8:41:25 PM    Final      Medical Consultants:    None.  Anti-Infectives:   none  Subjective:    [name] he relates his breathing is better than yesterday, but he relates he is still orthopneic, on a side note he has been orthopneic for 6 months.  Objective:    Vitals:   03/18/20 2121 03/19/20 0026 03/19/20 0035 03/19/20 0452  BP: 98/69 95/74 95/74  94/67  Pulse: 100 (!) 101 (!) 101 91  Resp:  18 18 18   Temp: 98.7 F (37.1 C) 97.7 F (36.5 C) 97.7 F (36.5 C) 97.9 F (36.6 C)  TempSrc: Oral Oral  Oral  SpO2: 100% 100%  98%  Weight:    60.9 kg   SpO2: 98 % O2 Flow Rate (  L/min): 7 L/min   Intake/Output Summary (Last 24 hours) at 03/19/2020 0737 Last data filed at 03/19/2020  0600 Gross per 24 hour  Intake 778 ml  Output 2150 ml  Net -1372 ml   Filed Weights   03/19/20 0452  Weight: 60.9 kg    Exam: General exam: In no acute distress. Respiratory system: Good air movement and bilateral crackles and wheezing Cardiovascular system: S1 & S2 heard, RRR. +JVD Gastrointestinal system: Abdomen is nondistended, soft and nontender.  Extremities: No pedal edema. Skin: No rashes, lesions or ulcers  Data Reviewed:    Labs: Basic Metabolic Panel: Recent Labs  Lab 03/16/20 1141 03/16/20 1141 03/18/20 0904 03/18/20 1131 03/19/20 0240  NA 142  --  139  --  139  K 3.8   < > 4.9  --  4.3  CL 105  --  105  --  99  CO2 31  --  25  --  34*  GLUCOSE 95  --  113*  --  132*  BUN 11  --  17  --  16  CREATININE 0.84  --  1.10 1.05 1.21  CALCIUM 8.9  --  8.7*  --  8.5*  MG  --   --   --  1.2*  --    < > = values in this interval not displayed.   GFR Estimated Creatinine Clearance: 58 mL/min (by C-G formula based on SCr of 1.21 mg/dL). Liver Function Tests: Recent Labs  Lab 03/16/20 1141 03/18/20 0904  AST 12* 17  ALT 12 18  ALKPHOS 62 61  BILITOT 0.3 0.5  PROT 6.9 7.0  ALBUMIN 2.3* 2.5*   No results for input(s): LIPASE, AMYLASE in the last 168 hours. No results for input(s): AMMONIA in the last 168 hours. Coagulation profile No results for input(s): INR, PROTIME in the last 168 hours. COVID-19 Labs  No results for input(s): DDIMER, FERRITIN, LDH, CRP in the last 72 hours.  Lab Results  Component Value Date   SARSCOV2NAA NEGATIVE 03/18/2020   SARSCOV2NAA NOT DETECTED 03/01/2019   [place] NEGATIVE 12/12/2018   SARSCOV2NAA NOT DETECTED 10/19/2018    CBC: Recent Labs  Lab 03/16/20 1141 03/18/20 0904 03/18/20 1131  WBC 10.1 10.0 9.3  NEUTROABS 7.7 7.4  --   HGB 7.7* 10.2* 10.7*  HCT 24.4* 34.2* 35.2*  MCV 91.0 94.2 93.9  PLT 215 215 197   Cardiac Enzymes: No results for input(s): CKTOTAL, CKMB, CKMBINDEX, TROPONINI in the last  168 hours. BNP (last 3 results) No results for input(s): PROBNP in the last 8760 hours. CBG: No results for input(s): GLUCAP in the last 168 hours. D-Dimer: No results for input(s): DDIMER in the last 72 hours. Hgb A1c: No results for input(s): HGBA1C in the last 72 hours. Lipid Profile: No results for input(s): CHOL, HDL, LDLCALC, TRIG, CHOLHDL, LDLDIRECT in the last 72 hours. Thyroid function studies: Recent Labs    03/18/20 1133  TSH 1.179   Anemia work up: No results for input(s): VITAMINB12, FOLATE, FERRITIN, TIBC, IRON, RETICCTPCT in the last 72 hours. Sepsis Labs: Recent Labs  Lab 03/16/20 1141 03/18/20 0904 03/18/20 1005 03/18/20 1131  WBC 10.1 10.0  --  9.3  LATICACIDVEN  --   --  0.6 0.6   Microbiology Recent Results (from the past 240 hour(s))  Respiratory Panel by RT PCR (Flu A&B, Covid) - Nasopharyngeal Swab     Status: None   Collection Time: 03/18/20  9:21 AM   Specimen: Nasopharyngeal Swab  Result Value Ref Range Status   SARS Coronavirus 2 by RT PCR NEGATIVE NEGATIVE Final    Comment: (NOTE) SARS-CoV-2 target nucleic acids are NOT DETECTED.  The SARS-CoV-2 RNA is generally detectable in upper respiratoy specimens during the acute phase of infection. The lowest concentration of SARS-CoV-2 viral copies this assay can detect is 131 copies/mL. A negative result does not preclude SARS-Cov-2 infection and should not be used as the sole basis for treatment or other patient management decisions. A negative result may occur with  improper specimen collection/handling, submission of specimen other than nasopharyngeal swab, presence of viral mutation(s) within the areas targeted by this assay, and inadequate number of viral copies (<131 copies/mL). A negative result must be combined with clinical observations, patient history, and epidemiological information. The expected result is Negative.  Fact Sheet for Patients:   PinkCheek.be  Fact Sheet for Healthcare Providers:  GravelBags.it  This test is no t yet approved or cleared by the [place] FDA and  has been authorized for detection and/or diagnosis of SARS-CoV-2 by FDA under an Emergency Use Authorization (EUA). This EUA will remain  in effect (meaning this test can be used) for the duration of the COVID-19 declaration under Section 564(b)(1) of the Act, 21 U.S.C. section 360bbb-3(b)(1), unless the authorization is terminated or revoked sooner.     Influenza A by PCR NEGATIVE NEGATIVE Final   Influenza B by PCR NEGATIVE NEGATIVE Final    Comment: (NOTE) The Xpert Xpress SARS-CoV-2/FLU/RSV assay is intended as an aid in  the diagnosis of influenza from Nasopharyngeal swab specimens and  should not be used as a sole basis for treatment. Nasal washings and  aspirates are unacceptable for Xpert Xpress SARS-CoV-2/FLU/RSV  testing.  Fact Sheet for Patients: PinkCheek.be  Fact Sheet for Healthcare Providers: GravelBags.it  This test is not yet approved or cleared by the [place] FDA and  has been authorized for detection and/or diagnosis of SARS-CoV-2 by  FDA under an Emergency Use Authorization (EUA). This EUA will remain  in effect (meaning this test can be used) for the duration of the  Covid-19 declaration under Section 564(b)(1) of the Act, 21  U.S.C. section 360bbb-3(b)(1), unless the authorization is  terminated or revoked. Performed at [facility], [place] [address], [place], [place] [address]      Medications:    aspirin EC  81 mg Oral Daily   atorvastatin  40 mg Oral Daily   buPROPion  150 mg Oral Daily   enoxaparin (LOVENOX) injection  40 mg Subcutaneous Q33A   folic acid  1 mg Oral Daily   furosemide  80 mg Intravenous BID   mometasone-formoterol  2 puff Inhalation BID    sacubitril-valsartan  1 tablet Oral BID   sodium chloride flush  3 mL Intravenous Q12H   umeclidinium bromide  1 puff Inhalation Daily   Continuous Infusions:  sodium chloride        LOS: 1 day   Charlynne Cousins  [facility]  03/19/2020, 7:37 AM

## 2020-03-19 NOTE — Progress Notes (Signed)
Patient requested prayer and prayer was done. Patient was appreciative. Said he felt better now and looking forward to getting better and getting out of hospital.Provided emotional and spiritual support.  Will follow as needed.  Jaclynn Major, [place], [facility], Pager [phone]

## 2020-03-19 NOTE — Progress Notes (Addendum)
Progress Note  Patient Name: [name] Date of Encounter: 03/19/2020  Primary Cardiologist:  Kirk Ruths, MD  Subjective   Breathing better, no CP  Inpatient Medications    Scheduled Meds: . aspirin EC  81 mg Oral Daily  . atorvastatin  40 mg Oral Daily  . buPROPion  150 mg Oral Daily  . enoxaparin (LOVENOX) injection  40 mg Subcutaneous Q24H  . folic acid  1 mg Oral Daily  . furosemide  80 mg Intravenous BID  . mometasone-formoterol  2 puff Inhalation BID  . sacubitril-valsartan  1 tablet Oral BID  . sodium chloride flush  3 mL Intravenous Q12H  . umeclidinium bromide  1 puff Inhalation Daily   Continuous Infusions: . sodium chloride     PRN Meds: sodium chloride, acetaminophen, HYDROcodone-acetaminophen, ipratropium, ondansetron (ZOFRAN) IV, prochlorperazine, sodium chloride flush   Vital Signs    Vitals:   03/19/20 0035 03/19/20 0452 03/19/20 0752 03/19/20 0830  BP: 95/74 94/67 100/68   Pulse: (!) 101 91 99   Resp: 18 18 16    Temp: 97.7 F (36.5 C) 97.9 F (36.6 C) 98.5 F (36.9 C)   TempSrc:  Oral Oral   SpO2:  98% 97% 98%  Weight:  60.9 kg      Intake/Output Summary (Last 24 hours) at 03/19/2020 0948 Last data filed at 03/19/2020 0700 Gross per 24 hour  Intake 896 ml  Output 2150 ml  Net -1254 ml   Filed Weights   03/19/20 0452  Weight: 60.9 kg   Last Weight  Most recent update: 03/19/2020  7:09 AM   Weight  60.9 kg (134 lb 4.8 oz)           Weight change:    Telemetry    SR, ST, rare PVCs - Personally Reviewed  ECG    None today - Personally Reviewed  Physical Exam   General: Well developed, well nourished, male appearing in no acute distress. Head: Normocephalic, atraumatic.  Neck: Supple without bruits, JVD 10 cm. Lungs:  Resp regular and unlabored, bilateral rales. Heart: RRR, S1, S2, no S3, S4, or murmur; no rub. Abdomen: Soft, non-tender, non-distended with normoactive bowel sounds. No hepatomegaly. No  rebound/guarding. No obvious abdominal masses. Extremities: No clubbing, cyanosis, edema has improved but not resolved. Distal pedal pulses are 2+ bilaterally. Neuro: Alert and oriented X 3. Moves all extremities spontaneously. Psych: Normal affect.  Labs    Hematology Recent Labs  Lab 03/16/20 1141 03/18/20 0904 03/18/20 1131  WBC 10.1 10.0 9.3  RBC 2.68* 3.63* 3.75*  HGB 7.7* 10.2* 10.7*  HCT 24.4* 34.2* 35.2*  MCV 91.0 94.2 93.9  MCH 28.7 28.1 28.5  MCHC 31.6 29.8* 30.4  RDW 20.0* 19.4* 19.4*  PLT 215 215 197    Chemistry Recent Labs  Lab 03/16/20 1141 03/18/20 0904 03/18/20 1131 03/19/20 0240  NA 142 139  --  139  K 3.8 4.9  --  4.3  CL 105 105  --  99  CO2 31 25  --  34*  GLUCOSE 95 113*  --  132*  BUN 11 17  --  16  CREATININE 0.84 1.10 1.05 1.21  CALCIUM 8.9 8.7*  --  8.5*  PROT 6.9 7.0  --   --   ALBUMIN 2.3* 2.5*  --   --   AST 12* 17  --   --   ALT 12 18  --   --   ALKPHOS 62 61  --   --  BILITOT 0.3 0.5  --   --   GFRNONAA >60 >60 >60 >60  ANIONGAP 6 9  --  6     High Sensitivity Troponin:   Recent Labs  Lab 03/18/20 0904 03/18/20 1131  TROPONINIHS 51* 63*      BNP Recent Labs  Lab 03/18/20 0904  BNP 2,750.7*    Lab Results  Component Value Date   CHOL 99 10/24/2017   HDL 15 (L) 10/24/2017   LDLCALC 73 10/24/2017   TRIG 56 10/24/2017   CHOLHDL 6.6 10/24/2017     DDimer No results for input(s): DDIMER in the last 168 hours.   Radiology    DG Chest Portable 1 View  Result Date: 03/18/2020 CLINICAL DATA:  Shortness of breath, lung cancer.  Swelling in legs. EXAM: PORTABLE CHEST 1 VIEW COMPARISON:  Chest radiograph 03/05/2019.  Chest CT 01/06/2020 FINDINGS: Similar opacification left lung apex with interspersed lucencies, characterized as bronchiectasis, chronic lung disease, and probable large aspirate alone a on prior CT. Right lower lobe mass was better characterized on recent CT. There is new mild diffuse interstitial  prominence, suggestive of mild interstitial edema. Pulmonary vascular congestion. Suspected small bilateral pleural effusions. No discernible pneumothorax. Right Port-A-Cath with the tip projecting at the cavoatrial junction. Left subclavian approach cardiac rhythm maintenance device. IMPRESSION: 1. New mild diffuse interstitial prominence, pulmonary vascular congestion, and suspected small bilateral pleural effusions, possibly related to mild pulmonary edema. Atypical pneumonia is a consideration. 2. Right lower lobe mass was better characterized on recent CT. 3. Similar chronic changes in the left lung apex, as detailed above. Electronically Signed   By: Margaretha Sheffield MD   On: 03/18/2020 09:38   ECHOCARDIOGRAM COMPLETE  Result Date: 03/18/2020    ECHOCARDIOGRAM REPORT   Patient Name:   [name] Date of Exam: 03/18/2020 Medical Rec #:  [id number]     Height:       68.0 in Accession #:    [id number]    Weight:       140.5 lb Date of Birth:  06-22-1960     BSA:          1.759 m Patient Age:    6 years      BP:           132/76 mmHg Patient Gender: M             HR:           115 bpm. Exam Location:  Inpatient Procedure: 2D Echo Indications:    CHF 428  History:        Patient has prior history of Echocardiogram examinations, most                 recent 03/01/2018. CHF and Cardiomyopathy, CAD and Previous                 Myocardial Infarction, Defibrillator and Prior CABG, COPD; Risk                 Factors:Dyslipidemia and Former Smoker. AICD. STEMI.  Sonographer:    Jannett Celestine RDCS (AE) Referring Phys: [id number] Michell Heinrich PAHWANI IMPRESSIONS  1. Left ventricular ejection fraction, by estimation, is <20%. The left ventricle has severely decreased function. The left ventricle demonstrates regional wall motion abnormalities (see scoring diagram/findings for description). The left ventricular internal cavity size was moderately to severely dilated. There is mild concentric left ventricular hypertrophy. Left  ventricular diastolic function could not be evaluated. There is diffuse  severe hypokinesis with akinesis of the distal to apical segments  of all walls.  2. Right ventricular systolic function was not well visualized. The right ventricular size is not well visualized.  3. The mitral valve is normal in structure. Mild to moderate mitral valve regurgitation.  4. The aortic valve is grossly normal. Aortic valve regurgitation is mild. No aortic stenosis is present. Comparison(s): Prior images unable to be directly viewed, comparison made by report only. Conclusion(s)/Recommendation(s): Severely reduced LVEF with both global and focal wall motion abnormalities. FINDINGS  Left Ventricle: LV apical thrombus excluded by contrast. Left ventricular ejection fraction, by estimation, is <20%. The left ventricle has severely decreased function. The left ventricle demonstrates regional wall motion abnormalities. The left ventricular internal cavity size was moderately to severely dilated. There is mild concentric left ventricular hypertrophy. Left ventricular diastolic function could not be evaluated.  LV Wall Scoring: There is diffuse severe hypokinesis with akinesis of the distal to apical segments of all walls. Right Ventricle: The right ventricular size is not well visualized. Right vetricular wall thickness was not assessed. Right ventricular systolic function was not well visualized. Left Atrium: Left atrial size was not assessed. Right Atrium: Right atrial size was not assessed. Pericardium: There is no evidence of pericardial effusion. Mitral Valve: The mitral valve is normal in structure. Mild to moderate mitral valve regurgitation. Tricuspid Valve: The tricuspid valve is normal in structure. Tricuspid valve regurgitation is trivial. No evidence of tricuspid stenosis. Aortic Valve: The aortic valve is grossly normal. Aortic valve regurgitation is mild. No aortic stenosis is present. Pulmonic Valve: The pulmonic valve was  grossly normal. Pulmonic valve regurgitation is not visualized. Aorta: The aortic root is normal in size and structure. Venous: IVC assessment for right atrial pressure unable to be performed due to mechanical ventilation. IAS/Shunts: The interatrial septum was not assessed. Additional Comments: A pacer wire is visualized in the right atrium and right ventricle.  LEFT VENTRICLE PLAX 2D LVIDd:         6.20 cm LVIDs:         5.20 cm LV PW:         1.20 cm LV IVS:        1.10 cm LVOT diam:     2.10 cm LV SV:         30 LV SV Index:   17 LVOT Area:     3.46 cm  LEFT ATRIUM         Index LA diam:    3.80 cm 2.16 cm/m  AORTIC VALVE LVOT Vmax:   59.70 cm/s LVOT Vmean:  44.200 cm/s LVOT VTI:    0.087 m  AORTA Ao Root diam: 3.30 cm MITRAL VALVE MV Area (PHT): 4.68 cm    SHUNTS MV Decel Time: 162 msec    Systemic VTI:  0.09 m MR Peak grad: 67.2 mmHg    Systemic Diam: 2.10 cm MR Mean grad: 41.0 mmHg MR Vmax:      410.00 cm/s MR Vmean:     300.0 cm/s MV E velocity: 85.10 cm/s MV A velocity: 60.60 cm/s MV E/A ratio:  1.40 Buford Dresser MD Electronically signed by Buford Dresser MD Signature Date/Time: 03/18/2020/8:41:25 PM    Final      Cardiac Studies   ECHO:  03/18/2020 1. Left ventricular ejection fraction, by estimation, is <20%. The left  ventricle has severely decreased function. The left ventricle demonstrates  regional wall motion abnormalities (see scoring diagram/findings for  description). The left ventricular  internal cavity size was moderately to severely dilated. There is mild  concentric left ventricular hypertrophy. Left ventricular diastolic  function could not be evaluated. There is diffuse severe hypokinesis with  akinesis of the distal to apical segments  of all walls.  2. Right ventricular systolic function was not well visualized. The right  ventricular size is not well visualized.  3. The mitral valve is normal in structure. Mild to moderate mitral valve    regurgitation.  4. The aortic valve is grossly normal. Aortic valve regurgitation is  mild. No aortic stenosis is present.   Comparison(s): Prior images unable to be directly viewed, comparison made  by report only.   Conclusion(s)/Recommendation(s): Severely reduced LVEF with both global  and focal wall motion abnormalities.   Patient Profile     59 y.o. male w/ hx CAD status post CABG '97, ischemic cardiomyopathy status post ICD, COPD, hyperlipidemia, hypertension and adenocarcinoma of the right lung who was admitted 10/20 for CHF and COPD exacerbation.  Assessment & Plan    1. Acute on chronic S-D-CHF - wt 140 lbs on 10/19, normally in the low 120s-110s - diuresis is helping his breathing, no longer on BiPAP - now at 6 lpm [place] - continue Lasix 80 mg IV BID, good UOP w/ this - although Cr above baseline, still w/in normal limits, other electrolytes ok - I/O net - 1.2 L, wt just gotten today and is 135, down 5 lbs from 10/19 - continue Entresto  2. CAD - no ischemic sx - not currently on BB, held due to acute CHF - continue ASA, statin  Otherwise, per IM Principal Problem:   Acute on chronic combined systolic and diastolic CHF (congestive heart failure) (HCC) Active Problems:   Coronary artery disease   Hyperlipidemia   Cardiomyopathy, ischemic   Right lower lobe lung mass   COPD (chronic obstructive pulmonary disease) (HCC)   Acute on chronic respiratory failure with hypoxia (HCC)   Acute on chronic combined systolic (congestive) and diastolic (congestive) heart failure ([place])    Signed, Rosaria Ferries , PA-C 9:48 AM 03/19/2020 Pager: [phone]  Pt seen and examined   I agree with findnigs as noted by R Barrett above    PT remains volume overloaded but improved from yesterday   ON exam JVP is increased  Lungs are relatively clear Cardiac exam  RRR  + S3  No signif murmurs Ext with 1 + edema legs  Would continue IV diuresis with 80 lasix bid   Follow I  / O and Cr Dietary consult to review low Na diet.  Dorris Carnes MD

## 2020-03-20 DIAGNOSIS — I5043 Acute on chronic combined systolic (congestive) and diastolic (congestive) heart failure: Secondary | ICD-10-CM | POA: Diagnosis not present

## 2020-03-20 DIAGNOSIS — J9621 Acute and chronic respiratory failure with hypoxia: Secondary | ICD-10-CM | POA: Diagnosis not present

## 2020-03-20 LAB — BASIC METABOLIC PANEL
Anion gap: 8 (ref 5–15)
BUN: 16 mg/dL (ref 6–20)
CO2: 34 mmol/L — ABNORMAL HIGH (ref 22–32)
Calcium: 9 mg/dL (ref 8.9–10.3)
Chloride: 95 mmol/L — ABNORMAL LOW (ref 98–111)
Creatinine, Ser: 1 mg/dL (ref 0.61–1.24)
GFR, Estimated: >60 mL/min (ref >60)
Glucose, Bld: 147 mg/dL — ABNORMAL HIGH (ref 70–99)
Potassium: 3.9 mmol/L (ref 3.5–5.1)
Sodium: 137 mmol/L (ref 135–145)

## 2020-03-20 MED ORDER — CARVEDILOL 3.125 MG PO TABS
3.1250 mg | ORAL_TABLET | Freq: Two times a day (BID) | ORAL | Status: DC
  Administered 2020-03-20: 3.125 mg via ORAL
  Filled 2020-03-20: qty 1

## 2020-03-20 MED ORDER — PNEUMOCOCCAL VAC POLYVALENT 25 MCG/0.5ML IJ INJ: 0.5000 mL | INJECTION | INTRAMUSCULAR | Status: DC

## 2020-03-20 NOTE — Progress Notes (Signed)
Patient refuses pneumonia vaccine. Additionally, he is receiving chemotherapy and therefore, not a candidate.

## 2020-03-20 NOTE — Progress Notes (Signed)
Patient had a good day. Reports significant improvement in breathing today "I can feel the difference". He had several questions regarding how to control fluid once discharged; education given. No complaints of pain today, remains comfortable.

## 2020-03-20 NOTE — Progress Notes (Signed)
Change in patient MEWS score related to decreased BP. Patient receiving lasix and has been running in the 41'S systolically intermittently since yesterday; doc notified and ok. Patient remains alert, asymptomatic and at baseline.

## 2020-03-20 NOTE — Progress Notes (Signed)
Progress Note  Patient Name: [name] Date of Encounter: 03/20/2020  Primary Cardiologist:  Kirk Ruths, MD  Subjective   Again, breathing is better than yest  No CP   Inpatient Medications    Scheduled Meds: . aspirin EC  81 mg Oral Daily  . atorvastatin  40 mg Oral Daily  . buPROPion  150 mg Oral Daily  . enoxaparin (LOVENOX) injection  40 mg Subcutaneous Q24H  . folic acid  1 mg Oral Daily  . furosemide  80 mg Intravenous BID  . mometasone-formoterol  2 puff Inhalation BID  . sacubitril-valsartan  1 tablet Oral BID  . sodium chloride flush  3 mL Intravenous Q12H  . umeclidinium bromide  1 puff Inhalation Daily   Continuous Infusions: . sodium chloride     PRN Meds: sodium chloride, acetaminophen, ipratropium, ondansetron (ZOFRAN) IV, prochlorperazine, sodium chloride flush   Vital Signs    Vitals:   03/19/20 2032 03/19/20 2039 03/19/20 2343 03/20/20 0524  BP: 93/67  95/64 94/67  Pulse: (!) 108 99 80 (!) 102  Resp: 18 18 16 20   Temp: 98.2 F (36.8 C)  98.2 F (36.8 C) 97.8 F (36.6 C)  TempSrc: Oral  Oral Oral  SpO2: 100% 99% 100% 100%  Weight:    58.7 kg  Height:        Intake/Output Summary (Last 24 hours) at 03/20/2020 0706 Last data filed at 03/20/2020 0200 Gross per 24 hour  Intake --  Output 2750 ml  Net -2750 ml   Net neg 4 L     Filed Weights   03/19/20 0452 03/19/20 1000 03/20/20 0524  Weight: 60.9 kg 61.6 kg 58.7 kg   Last Weight  Most recent update: 03/20/2020  5:48 AM   Weight  58.7 kg (129 lb 8 oz)           Weight change: 0.682 kg   Telemetry    SR  - Personally Reviewed  ECG    None today - Personally Reviewed  Physical Exam   General: Well developed, well nourished, male appearing in no acute distress. Head: Normocephalic, atraumatic.  Neck: JVP nrmal   Lungs:  Resp regular and unlabored, bilateral rales. Heart: RRR, S1, S2, no S3  Abdomen: RUQ tenderness Extremities:Tr edema Neuro: Alert and  oriented X 3. Moves all extremities spontaneously. Psych: Normal affect.  Labs    Hematology Recent Labs  Lab 03/16/20 1141 03/18/20 0904 03/18/20 1131  WBC 10.1 10.0 9.3  RBC 2.68* 3.63* 3.75*  HGB 7.7* 10.2* 10.7*  HCT 24.4* 34.2* 35.2*  MCV 91.0 94.2 93.9  MCH 28.7 28.1 28.5  MCHC 31.6 29.8* 30.4  RDW 20.0* 19.4* 19.4*  PLT 215 215 197    Chemistry Recent Labs  Lab 03/16/20 1141 03/16/20 1141 03/18/20 0904 03/18/20 0904 03/18/20 1131 03/19/20 0240 03/20/20 0212  NA 142   < > 139  --   --  139 137  K 3.8   < > 4.9  --   --  4.3 3.9  CL 105   < > 105  --   --  99 95*  CO2 31   < > 25  --   --  34* 34*  GLUCOSE 95   < > 113*  --   --  132* 147*  BUN 11   < > 17  --   --  16 16  CREATININE 0.84  --  1.10   < > 1.05 1.21 1.00  CALCIUM  8.9   < > 8.7*  --   --  8.5* 9.0  PROT 6.9  --  7.0  --   --   --   --   ALBUMIN 2.3*  --  2.5*  --   --   --   --   AST 12*  --  17  --   --   --   --   ALT 12  --  18  --   --   --   --   ALKPHOS 62  --  61  --   --   --   --   BILITOT 0.3  --  0.5  --   --   --   --   GFRNONAA >60  --  >60   < > >60 >60 >60  ANIONGAP 6   < > 9  --   --  6 8   < > = values in this interval not displayed.     High Sensitivity Troponin:   Recent Labs  Lab 03/18/20 0904 03/18/20 1131  TROPONINIHS 51* 63*      BNP Recent Labs  Lab 03/18/20 0904  BNP 2,750.7*    Lab Results  Component Value Date   CHOL 99 10/24/2017   HDL 15 (L) 10/24/2017   LDLCALC 73 10/24/2017   TRIG 56 10/24/2017   CHOLHDL 6.6 10/24/2017     DDimer No results for input(s): DDIMER in the last 168 hours.   Radiology    DG Chest Portable 1 View  Result Date: 03/18/2020 CLINICAL DATA:  Shortness of breath, lung cancer.  Swelling in legs. EXAM: PORTABLE CHEST 1 VIEW COMPARISON:  Chest radiograph 03/05/2019.  Chest CT 01/06/2020 FINDINGS: Similar opacification left lung apex with interspersed lucencies, characterized as bronchiectasis, chronic lung disease, and  probable large aspirate alone a on prior CT. Right lower lobe mass was better characterized on recent CT. There is new mild diffuse interstitial prominence, suggestive of mild interstitial edema. Pulmonary vascular congestion. Suspected small bilateral pleural effusions. No discernible pneumothorax. Right Port-A-Cath with the tip projecting at the cavoatrial junction. Left subclavian approach cardiac rhythm maintenance device. IMPRESSION: 1. New mild diffuse interstitial prominence, pulmonary vascular congestion, and suspected small bilateral pleural effusions, possibly related to mild pulmonary edema. Atypical pneumonia is a consideration. 2. Right lower lobe mass was better characterized on recent CT. 3. Similar chronic changes in the left lung apex, as detailed above. Electronically Signed   By: Margaretha Sheffield MD   On: 03/18/2020 09:38   ECHOCARDIOGRAM COMPLETE  Result Date: 03/18/2020    ECHOCARDIOGRAM REPORT   Patient Name:   [name] Date of Exam: 03/18/2020 Medical Rec #:  [id number]     Height:       68.0 in Accession #:    [id number]    Weight:       140.5 lb Date of Birth:  Oct 12, 1960     BSA:          1.759 m Patient Age:    59 years      BP:           132/76 mmHg Patient Gender: M             HR:           115 bpm. Exam Location:  Inpatient Procedure: 2D Echo Indications:    CHF 428  History:        Patient has prior history of Echocardiogram examinations, most  recent 03/01/2018. CHF and Cardiomyopathy, CAD and Previous                 Myocardial Infarction, Defibrillator and Prior CABG, COPD; Risk                 Factors:Dyslipidemia and Former Smoker. AICD. STEMI.  Sonographer:    Jannett Celestine RDCS (AE) Referring Phys: [id number] Michell Heinrich PAHWANI IMPRESSIONS  1. Left ventricular ejection fraction, by estimation, is <20%. The left ventricle has severely decreased function. The left ventricle demonstrates regional wall motion abnormalities (see scoring diagram/findings for  description). The left ventricular internal cavity size was moderately to severely dilated. There is mild concentric left ventricular hypertrophy. Left ventricular diastolic function could not be evaluated. There is diffuse severe hypokinesis with akinesis of the distal to apical segments  of all walls.  2. Right ventricular systolic function was not well visualized. The right ventricular size is not well visualized.  3. The mitral valve is normal in structure. Mild to moderate mitral valve regurgitation.  4. The aortic valve is grossly normal. Aortic valve regurgitation is mild. No aortic stenosis is present. Comparison(s): Prior images unable to be directly viewed, comparison made by report only. Conclusion(s)/Recommendation(s): Severely reduced LVEF with both global and focal wall motion abnormalities. FINDINGS  Left Ventricle: LV apical thrombus excluded by contrast. Left ventricular ejection fraction, by estimation, is <20%. The left ventricle has severely decreased function. The left ventricle demonstrates regional wall motion abnormalities. The left ventricular internal cavity size was moderately to severely dilated. There is mild concentric left ventricular hypertrophy. Left ventricular diastolic function could not be evaluated.  LV Wall Scoring: There is diffuse severe hypokinesis with akinesis of the distal to apical segments of all walls. Right Ventricle: The right ventricular size is not well visualized. Right vetricular wall thickness was not assessed. Right ventricular systolic function was not well visualized. Left Atrium: Left atrial size was not assessed. Right Atrium: Right atrial size was not assessed. Pericardium: There is no evidence of pericardial effusion. Mitral Valve: The mitral valve is normal in structure. Mild to moderate mitral valve regurgitation. Tricuspid Valve: The tricuspid valve is normal in structure. Tricuspid valve regurgitation is trivial. No evidence of tricuspid stenosis.  Aortic Valve: The aortic valve is grossly normal. Aortic valve regurgitation is mild. No aortic stenosis is present. Pulmonic Valve: The pulmonic valve was grossly normal. Pulmonic valve regurgitation is not visualized. Aorta: The aortic root is normal in size and structure. Venous: IVC assessment for right atrial pressure unable to be performed due to mechanical ventilation. IAS/Shunts: The interatrial septum was not assessed. Additional Comments: A pacer wire is visualized in the right atrium and right ventricle.  LEFT VENTRICLE PLAX 2D LVIDd:         6.20 cm LVIDs:         5.20 cm LV PW:         1.20 cm LV IVS:        1.10 cm LVOT diam:     2.10 cm LV SV:         30 LV SV Index:   17 LVOT Area:     3.46 cm  LEFT ATRIUM         Index LA diam:    3.80 cm 2.16 cm/m  AORTIC VALVE LVOT Vmax:   59.70 cm/s LVOT Vmean:  44.200 cm/s LVOT VTI:    0.087 m  AORTA Ao Root diam: 3.30 cm MITRAL VALVE MV Area (PHT): 4.68 cm  SHUNTS MV Decel Time: 162 msec    Systemic VTI:  0.09 m MR Peak grad: 67.2 mmHg    Systemic Diam: 2.10 cm MR Mean grad: 41.0 mmHg MR Vmax:      410.00 cm/s MR Vmean:     300.0 cm/s MV E velocity: 85.10 cm/s MV A velocity: 60.60 cm/s MV E/A ratio:  1.40 Buford Dresser MD Electronically signed by Buford Dresser MD Signature Date/Time: 03/18/2020/8:41:25 PM    Final      Cardiac Studies   ECHO:  03/18/2020 1. Left ventricular ejection fraction, by estimation, is <20%. The left  ventricle has severely decreased function. The left ventricle demonstrates  regional wall motion abnormalities (see scoring diagram/findings for  description). The left ventricular  internal cavity size was moderately to severely dilated. There is mild  concentric left ventricular hypertrophy. Left ventricular diastolic  function could not be evaluated. There is diffuse severe hypokinesis with  akinesis of the distal to apical segments  of all walls.  2. Right ventricular systolic function was not  well visualized. The right  ventricular size is not well visualized.  3. The mitral valve is normal in structure. Mild to moderate mitral valve  regurgitation.  4. The aortic valve is grossly normal. Aortic valve regurgitation is  mild. No aortic stenosis is present.   Comparison(s): Prior images unable to be directly viewed, comparison made  by report only.   Conclusion(s)/Recommendation(s): Severely reduced LVEF with both global  and focal wall motion abnormalities.   Patient Profile     59 y.o. male w/ hx CAD status post CABG '97, ischemic cardiomyopathy status post ICD, COPD, hyperlipidemia, hypertension and adenocarcinoma of the right lung who was admitted 10/20 for CHF and COPD exacerbation.  Assessment & Plan    1. Acute on chronic systolic CHF    Pt has diuresed 4 L so far    BUN/CR have remained stable, actually Cr a little better  I would continue duresing today    Dietary to see patient   Needs education of what /what not to eat   Pressure is marginal   Off of coreg now  I would hold so as not to lower furhter  May need to reassess use of meds (entresto, coreg) and use toprol / losartan  One for BP and also for compliance   No change for now     2. CAD - no ischemic sx - continue ASA, statin  3  HL  Keep on statin    Richrd Humbles , PA-C 7:06 AM 03/20/2020

## 2020-03-20 NOTE — Plan of Care (Signed)
Nutrition Education Note  RD consulted for nutrition education regarding CHF.  Spoke with pt at bedside. He reports frustration with being hospitalized again (he shares that he was hospitalized at hospital in [place], [place]) with similar symptoms. Pt shares that he was "eating all of the wrong things" and had been discharged from the hospital previously with lower extremity edema. He reports feeling weak PTA and relying on takeout and convenience foods. He admits to eating fast food and adding salt to his food. He reports intention of trying to cook more at home. He reports he is going to get a scale to weigh himself.   RD provided "Low Sodium Nutrition Therapy" handout from the [employer]. Reviewed patient's dietary recall. Provided examples on ways to decrease sodium intake in diet. Discouraged intake of processed foods and use of salt shaker. Encouraged fresh fruits and vegetables as well as whole grain sources of carbohydrates to maximize fiber intake.   RD discussed why it is important for patient to adhere to diet recommendations, and emphasized the role of fluids, foods to avoid, and importance of weighing self daily. Teach back method used.  Expect fair compliance.  Current diet order is 2 gram sodium, patient is consuming approximately 100% of meals at this time. Labs and medications reviewed. No further nutrition interventions warranted at this time. RD contact information provided. If additional nutrition issues arise, please re-consult RD.   Loistine Chance, RD, LDN, [place] Registered Dietitian II Certified Diabetes Care and Education Specialist Please refer to [facility] for RD and/or RD on-call/weekend/after hours pager

## 2020-03-20 NOTE — Discharge Instructions (Addendum)
Low Sodium Nutrition Therapy  Eating less sodium can help you if you have high blood pressure, heart failure, or kidney or liver disease.   Your body needs a little sodium, but too much sodium can cause your body to hold onto extra water. This extra water will raise your blood pressure and can cause damage to your heart, kidneys, or liver as they are forced to work harder.   Sometimes you can see how the extra fluid affects you because your hands, legs, or belly swell. You may also hold water around your heart and lungs, which makes it hard to breathe.   Even if you take medication for blood pressure or a water pill (diuretic) to remove fluid, it is still important to have less salt in your diet.   Check with your primary care provider before drinking alcohol since it may affect the amount of fluid in your body and how your heart, kidneys, or liver work. Sodium in Food A low-sodium meal plan limits the sodium that you get from food and beverages to 1,500-2,000 milligrams (mg) per day. Salt is the main source of sodium. Read the nutrition label on the package to find out how much sodium is in one serving of a food.  . Select foods with 140 milligrams (mg) of sodium or less per serving.  . You may be able to eat one or two servings of foods with a little more than 140 milligrams (mg) of sodium if you are closely watching how much sodium you eat in a day.  . Check the serving size on the label. The amount of sodium listed on the label shows the amount in one serving of the food. So, if you eat more than one serving, you will get more sodium than the amount listed.  Tips Cutting Back on Sodium . Eat more fresh foods.  . Fresh fruits and vegetables are low in sodium, as well as frozen vegetables and fruits that have no added juices or sauces.  . Fresh meats are lower in sodium than processed meats, such as bacon, sausage, and hotdogs.  . Not all processed foods are unhealthy, but some processed foods  may have too much sodium.  . Eat less salt at the table and when cooking. One of the ingredients in salt is sodium.  . One teaspoon of table salt has 2,300 milligrams of sodium.  . Leave the salt out of recipes for pasta, casseroles, and soups. . Be a smart shopper.  . Food packages that say "Salt-free", sodium-free", "very low sodium," and "low sodium" have less than 140 milligrams of sodium per serving.  . Beware of products identified as "Unsalted," "No Salt Added," "Reduced Sodium," or "Lower Sodium." These items may still be high in sodium. You should always check the nutrition label. . Add flavors to your food without adding sodium.  . Try lemon juice, lime juice, or vinegar.  . Dry or fresh herbs add flavor.  . Buy a sodium-free seasoning blend or make your own at home. . You can purchase salt-free or sodium-free condiments like barbeque sauce in stores and online. Ask your registered dietitian nutritionist for recommendations and where to find them.  .  Eating in Restaurants . Choose foods carefully when you eat outside your home. Restaurant foods can be very high in sodium. Many restaurants provide nutrition facts on their menus or their websites. If you cannot find that information, ask your server. Let your server know that you want your food   to be cooked without salt and that you would like your salad dressing and sauces to be served on the side.  .   . Foods Recommended . Food Group . Foods Recommended  . Grains . Bread, bagels, rolls without salted tops Homemade bread made with reduced-sodium baking powder Cold cereals, especially shredded wheat and puffed rice Oats, grits, or cream of wheat Pastas, quinoa, and rice Popcorn, pretzels or crackers without salt Corn tortillas  . Protein Foods . Fresh meats and fish; turkey bacon (check the nutrition labels - make sure they are not packaged in a sodium solution) Canned or packed tuna (no more than 4 ounces at 1 serving) Beans  and peas Soybeans) and tofu Eggs Nuts or nut butters without salt  . Dairy . Milk or milk powder Plant milks, such as rice and soy Yogurt, including Greek yogurt Small amounts of natural cheese (blocks of cheese) or reduced-sodium cheese can be used in moderation. (Swiss, ricotta, and fresh mozzarella cheese are lower in sodium than the others) Cream Cheese Low sodium cottage cheese  . Vegetables . Fresh and frozen vegetables without added sauces or salt Homemade soups (without salt) Low-sodium, salt-free or sodium-free canned vegetables and soups  . Fruit . Fresh and canned fruits Dried fruits, such as raisins, cranberries, and prunes  . Oils . Tub or liquid margarine, regular or without salt Canola, corn, peanut, olive, safflower, or sunflower oils  . Condiments . Fresh or dried herbs such as basil, bay leaf, dill, mustard (dry), nutmeg, paprika, parsley, rosemary, sage, or thyme.  Low sodium ketchup Vinegar  Lemon or lime juice Pepper, red pepper flakes, and cayenne. Hot sauce contains sodium, but if you use just a drop or two, it will not add up to much.  Salt-free or sodium-free seasoning mixes and marinades Simple salad dressings: vinegar and oil  .  . Foods Not Recommended . Food Group . Foods Not Recommended  . Grains . Breads or crackers topped with salt Cereals (hot/cold) with more than 300 mg sodium per serving Biscuits, cornbread, and other "quick" breads prepared with baking soda Pre-packaged bread crumbs Seasoned and packaged rice and pasta mixes Self-rising flours  . Protein Foods . Cured meats: Bacon, ham, sausage, pepperoni and hot dogs Canned meats (chili, vienna sausage, or sardines) Smoked fish and meats Frozen meals that have more than 600 mg of sodium per serving Egg substitute (with added sodium)  . Dairy . Buttermilk Processed cheese spreads Cottage cheese (1 cup may have over 500 mg of sodium; look for low-sodium.) American or feta cheese Shredded  Cheese has more sodium than blocks of cheese String cheese  . Vegetables . Canned vegetables (unless they are salt-free, sodium-free or low sodium) Frozen vegetables with seasoning and sauces Sauerkraut and pickled vegetables Canned or dried soups (unless they are salt-free, sodium-free, or low sodium) French fries and onion rings  . Fruit . Dried fruits preserved with additives that have sodium  . Oils . Salted butter or margarine, all types of olives  . Condiments . Salt, sea salt, kosher salt, onion salt, and garlic salt Seasoning mixes with salt Bouillon cubes Ketchup Barbeque sauce and Worcestershire sauce unless low sodium Soy sauce Salsa, pickles, olives, relish Salad dressings: ranch, blue cheese, Italian, and French.  .  . Low Sodium Sample 1-Day Menu  . Breakfast . 1 cup cooked oatmeal  . 1 slice whole wheat bread toast  . 1 tablespoon peanut butter without salt  . 1 banana  .   1 cup 1% milk  . Lunch . Tacos made with: 2 corn tortillas  .  cup black beans, low sodium  .  cup roasted or grilled chicken (without skin)  .  avocado  . Squeeze of lime juice  . 1 cup salad greens  . 1 tablespoon low-sodium salad dressing  .  cup strawberries  . 1 orange  . Afternoon Snack . 1/3 cup grapes  . 6 ounces yogurt  . Evening Meal . 3 ounces herb-baked fish  . 1 baked potato  . 2 teaspoons olive oil  .  cup cooked carrots  . 2 thick slices tomatoes on:  . 2 lettuce leaves  . 1 teaspoon olive oil  . 1 teaspoon balsamic vinegar  . 1 cup 1% milk  . Evening Snack . 1 apple  .  cup almonds without salt  .  . Low-Sodium Vegetarian (Lacto-Ovo) Sample 1-Day Menu  . Breakfast . 1 cup cooked oatmeal  . 1 slice whole wheat toast  . 1 tablespoon peanut butter without salt  . 1 banana  . 1 cup 1% milk  . Lunch . Tacos made with: 2 corn tortillas  .  cup black beans, low sodium  .  cup roasted or grilled chicken (without skin)  .  avocado  . Squeeze of lime juice  . 1  cup salad greens  . 1 tablespoon low-sodium salad dressing  .  cup strawberries  . 1 orange  . Evening Meal . Stir fry made with:  cup tofu  . 1 cup brown rice  .  cup broccoli  .  cup green beans  .  cup peppers  .  tablespoon peanut oil  . 1 orange  . 1 cup 1% milk  . Evening Snack . 4 strips celery  . 2 tablespoons hummus  . 1 hard-boiled egg  .  . Low-Sodium Vegan Sample 1-Day Menu  . Breakfast . 1 cup cooked oatmeal  . 1 tablespoon peanut butter without salt  . 1 cup blueberries  . 1 cup soymilk fortified with calcium, vitamin B12, and vitamin D  . Lunch . 1 small whole wheat pita  .  cup cooked lentils  . 2 tablespoons hummus  . 4 carrot sticks  . 1 medium apple  . 1 cup soymilk fortified with calcium, vitamin B12, and vitamin D  . Evening Meal . Stir fry made with:  cup tofu  . 1 cup brown rice  .  cup broccoli  .  cup green beans  .  cup peppers  .  tablespoon peanut oil  . 1 cup cantaloupe  . Evening Snack . 1 cup soy yogurt  .  cup mixed nuts  . Copyright 2020  [employer]y [employer]. All rights reserved .  . Sodium Free Flavoring Tips .  . When cooking, the following items may be used for flavoring instead of salt or seasonings that contain sodium. . Remember: A little bit of spice goes a long way! Be careful not to overseason. . Spice Blend Recipe (makes about ? cup) . 5 teaspoons onion powder  . 2 teaspoons garlic powder  . 2 teaspoons paprika  . 2 teaspoon dry mustard  . 1 teaspoon crushed thyme leaves  .  teaspoon [place] pepper  .  teaspoon celery seed Food Item Flavorings  Beef Basil, bay leaf, caraway, curry, dill, dry mustard, garlic, grape jelly, green pepper, mace, marjoram, mushrooms (fresh), nutmeg, onion or onion powder, parsley, pepper,   rosemary, sage  Chicken Basil, cloves, cranberries, mace, mushrooms (fresh), nutmeg, oregano, paprika, parsley, pineapple, saffron, sage, savory, tarragon, thyme, tomato,  turmeric  Egg Chervil, curry, dill, dry mustard, garlic or garlic powder, green pepper, jelly, mushrooms (fresh), nutmeg, onion powder, paprika, parsley, rosemary, tarragon, tomato  Fish Basil, bay leaf, chervil, curry, dill, dry mustard, green pepper, lemon juice, marjoram, mushrooms (fresh), paprika, pepper, tarragon, tomato, turmeric  Lamb Cloves, curry, dill, garlic or garlic powder, mace, mint, mint jelly, onion, oregano, parsley, pineapple, rosemary, tarragon, thyme  Pork Applesauce, basil, caraway, chives, cloves, garlic or garlic powder, onion or onion powder, rosemary, thyme  Veal Apricots, basil, bay leaf, currant jelly, curry, ginger, marjoram, mushrooms (fresh), oregano, paprika  Vegetables Basil, dill, garlic or garlic powder, ginger, lemon juice, mace, marjoram, nutmeg, onion or onion powder, tarragon, tomato, sugar or sugar substitute, salt-free salad dressing, vinegar  Desserts Allspice, anise, cinnamon, cloves, ginger, mace, nutmeg, vanilla extract, other extracts   Copyright 2020  [employer]y [employer]. All rights reserved   

## 2020-03-20 NOTE — Progress Notes (Signed)
[facility] PROGRESS NOTE    Progress Note  [name]  JJO:[id number] DOB: 1960/10/09 DOA: 03/18/2020 PCP: [name], MD     Brief Narrative:   [name] is an 59 y.o. male past medical history significant for coronary artery disease status post CABG, ischemic cardiomyopathy with an EF of 20% status post AICD placement, chronic hypoxic respiratory failure secondary to COPD on 4 L of oxygen, stage Ia non-small cell cancer diagnosed in February 2015 status post right lower lobe wedge resection as well as SBRT with a recurrent disease to the right lower lobe in June 2019 which has been in observation since then. Stage T1 adenocarcinoma of the prostate with a Gleason score of 4 diagnosed in November 2019 with a confirmed PET scan and biopsy of the right lower mass consistent with adenocarcinoma currently on chemotherapy, chemotherapy-induced anemia status post transfusion presents to the emergency room with shortness of breath for 2 weeks associated with lower extremity edema, orthopnea PND and a 20 pound weight gain, BNP of 2700 bilateral pleural effusions with pulmonary edema.  Assessment/Plan:   Acute on chronic combined systolic and diastolic CHF (congestive heart failure) ([place]): Usually weighs like around 50 to 53 kg, this morning his weight is 58 kg. He was taken off the BiPAP, will continue IV Lasix, he is 4 L negative. Can resume his beta-blocker. Continue Lasix IV twice daily monitor electrolytes and replete as needed. His creatinine is now improved.  Coronary artery disease: Currently chest pain-free. Continue aspirin and statins.  Essential hypertension: Continue Entresto and IV Lasix. Resume Coreg.  Hyperlipidemia: Continue statins.  Prostate adenocarcinoma of the right lung: Follow-up with Dr. [name] for chemotherapy as an outpatient.  COPD (chronic obstructive pulmonary disease) ([place]): Can continue inhalers currently no  wheezing.  Depression/anxiety: Continue Wellbutrin.    DVT prophylaxis: lovenxo Family Communication:none Status is: Inpatient  Remains inpatient appropriate because:Hemodynamically unstable   Dispo: The patient is from: Home              Anticipated d/c is to: Home              Anticipated d/c date is: 2 days              Patient currently is not medically stable to d/c.  Once he is on minimal oxygen requirements.      Code Status:     Code Status Orders  (From admission, onward)         Start     Ordered   03/18/20 1126  Full code  Continuous        03/18/20 1126        Code Status History    Date Active Date Inactive Code Status Order ID Comments User Context   12/12/2018 2034 12/14/2018 1518 Full Code [id number]  [name], MD Inpatient   10/24/2017 0140 10/26/2017 1934 Full Code [id number]  [name], MD ED   03/23/2017 0429 03/26/2017 1710 Full Code [id number]  [name], MD ED   06/15/2015 0357 06/17/2015 1854 Full Code [id number]  [name], MD ED   08/07/2013 1900 08/16/2013 1737 Full Code [id number]  [name], PA-C Inpatient   07/18/2013 1253 07/19/2013 0343 Full Code [id number]  [name], MD [facility]   Advance Care Planning Activity        IV Access:    Peripheral IV   Procedures and diagnostic studies:   DG Chest Portable 1 View  Result Date: 03/18/2020 CLINICAL  DATA:  Shortness of breath, lung cancer.  Swelling in legs. EXAM: PORTABLE CHEST 1 VIEW COMPARISON:  Chest radiograph 03/05/2019.  Chest CT 01/06/2020 FINDINGS: Similar opacification left lung apex with interspersed lucencies, characterized as bronchiectasis, chronic lung disease, and probable large aspirate alone a on prior CT. Right lower lobe mass was better characterized on recent CT. There is new mild diffuse interstitial prominence, suggestive of mild interstitial edema. Pulmonary vascular congestion. Suspected small bilateral pleural effusions. No  discernible pneumothorax. Right Port-A-Cath with the tip projecting at the cavoatrial junction. Left subclavian approach cardiac rhythm maintenance device. IMPRESSION: 1. New mild diffuse interstitial prominence, pulmonary vascular congestion, and suspected small bilateral pleural effusions, possibly related to mild pulmonary edema. Atypical pneumonia is a consideration. 2. Right lower lobe mass was better characterized on recent CT. 3. Similar chronic changes in the left lung apex, as detailed above. Electronically Signed   By: Margaretha Sheffield MD   On: 03/18/2020 09:38   ECHOCARDIOGRAM COMPLETE  Result Date: 03/18/2020    ECHOCARDIOGRAM REPORT   Patient Name:   [name] Date of Exam: 03/18/2020 Medical Rec #:  [id number]     Height:       68.0 in Accession #:    [id number]    Weight:       140.5 lb Date of Birth:  27-Mar-1961     BSA:          1.759 m Patient Age:    89 years      BP:           132/76 mmHg Patient Gender: M             HR:           115 bpm. Exam Location:  Inpatient Procedure: 2D Echo Indications:    CHF 428  History:        Patient has prior history of Echocardiogram examinations, most                 recent 03/01/2018. CHF and Cardiomyopathy, CAD and Previous                 Myocardial Infarction, Defibrillator and Prior CABG, COPD; Risk                 Factors:Dyslipidemia and Former Smoker. AICD. STEMI.  Sonographer:    Jannett Celestine RDCS (AE) Referring Phys: [id number] Michell Heinrich PAHWANI IMPRESSIONS  1. Left ventricular ejection fraction, by estimation, is <20%. The left ventricle has severely decreased function. The left ventricle demonstrates regional wall motion abnormalities (see scoring diagram/findings for description). The left ventricular internal cavity size was moderately to severely dilated. There is mild concentric left ventricular hypertrophy. Left ventricular diastolic function could not be evaluated. There is diffuse severe hypokinesis with akinesis of the distal to apical  segments  of all walls.  2. Right ventricular systolic function was not well visualized. The right ventricular size is not well visualized.  3. The mitral valve is normal in structure. Mild to moderate mitral valve regurgitation.  4. The aortic valve is grossly normal. Aortic valve regurgitation is mild. No aortic stenosis is present. Comparison(s): Prior images unable to be directly viewed, comparison made by report only. Conclusion(s)/Recommendation(s): Severely reduced LVEF with both global and focal wall motion abnormalities. FINDINGS  Left Ventricle: LV apical thrombus excluded by contrast. Left ventricular ejection fraction, by estimation, is <20%. The left ventricle has severely decreased function. The left ventricle demonstrates regional wall motion abnormalities. The left  ventricular internal cavity size was moderately to severely dilated. There is mild concentric left ventricular hypertrophy. Left ventricular diastolic function could not be evaluated.  LV Wall Scoring: There is diffuse severe hypokinesis with akinesis of the distal to apical segments of all walls. Right Ventricle: The right ventricular size is not well visualized. Right vetricular wall thickness was not assessed. Right ventricular systolic function was not well visualized. Left Atrium: Left atrial size was not assessed. Right Atrium: Right atrial size was not assessed. Pericardium: There is no evidence of pericardial effusion. Mitral Valve: The mitral valve is normal in structure. Mild to moderate mitral valve regurgitation. Tricuspid Valve: The tricuspid valve is normal in structure. Tricuspid valve regurgitation is trivial. No evidence of tricuspid stenosis. Aortic Valve: The aortic valve is grossly normal. Aortic valve regurgitation is mild. No aortic stenosis is present. Pulmonic Valve: The pulmonic valve was grossly normal. Pulmonic valve regurgitation is not visualized. Aorta: The aortic root is normal in size and structure. Venous:  IVC assessment for right atrial pressure unable to be performed due to mechanical ventilation. IAS/Shunts: The interatrial septum was not assessed. Additional Comments: A pacer wire is visualized in the right atrium and right ventricle.  LEFT VENTRICLE PLAX 2D LVIDd:         6.20 cm LVIDs:         5.20 cm LV PW:         1.20 cm LV IVS:        1.10 cm LVOT diam:     2.10 cm LV SV:         30 LV SV Index:   17 LVOT Area:     3.46 cm  LEFT ATRIUM         Index LA diam:    3.80 cm 2.16 cm/m  AORTIC VALVE LVOT Vmax:   59.70 cm/s LVOT Vmean:  44.200 cm/s LVOT VTI:    0.087 m  AORTA Ao Root diam: 3.30 cm MITRAL VALVE MV Area (PHT): 4.68 cm    SHUNTS MV Decel Time: 162 msec    Systemic VTI:  0.09 m MR Peak grad: 67.2 mmHg    Systemic Diam: 2.10 cm MR Mean grad: 41.0 mmHg MR Vmax:      410.00 cm/s MR Vmean:     300.0 cm/s MV E velocity: 85.10 cm/s MV A velocity: 60.60 cm/s MV E/A ratio:  1.40 Buford Dresser MD Electronically signed by Buford Dresser MD Signature Date/Time: 03/18/2020/8:41:25 PM    Final      Medical Consultants:    None.  Anti-Infectives:   none  Subjective:    [name] relates his breathing is significantly better today.  Orthopnea has resolved.  Objective:    Vitals:   03/19/20 2039 03/19/20 2343 03/20/20 0524 03/20/20 0812  BP:  95/64 94/67 102/67  Pulse: 99 80 (!) 102 92  Resp: 18 16 20 19   Temp:  98.2 F (36.8 C) 97.8 F (36.6 C) 98.2 F (36.8 C)  TempSrc:  Oral Oral Oral  SpO2: 99% 100% 100% 94%  Weight:   58.7 kg   Height:       SpO2: 94 % O2 Flow Rate (L/min): 6 L/min   Intake/Output Summary (Last 24 hours) at 03/20/2020 0903 Last data filed at 03/20/2020 0200 Gross per 24 hour  Intake --  Output 2750 ml  Net -2750 ml   Filed Weights   03/19/20 0452 03/19/20 1000 03/20/20 0524  Weight: 60.9 kg 61.6 kg 58.7 kg  Exam: General exam: In no acute distress. Respiratory system: Good air movement and clear to  auscultation. Cardiovascular system: S1 & S2 heard, RRR.  Positive JVD Gastrointestinal system: Abdomen is nondistended, soft and nontender.  xtremities: No pedal edema. Skin: No rashes, lesions or ulcers  Data Reviewed:    Labs: Basic Metabolic Panel: Recent Labs  Lab 03/16/20 1141 03/16/20 1141 03/18/20 0904 03/18/20 0904 03/18/20 1131 03/19/20 0240 03/20/20 0212  NA 142  --  139  --   --  139 137  K 3.8   < > 4.9   < >  --  4.3 3.9  CL 105  --  105  --   --  99 95*  CO2 31  --  25  --   --  34* 34*  GLUCOSE 95  --  113*  --   --  132* 147*  BUN 11  --  17  --   --  16 16  CREATININE 0.84  --  1.10  --  1.05 1.21 1.00  CALCIUM 8.9  --  8.7*  --   --  8.5* 9.0  MG  --   --   --   --  1.2*  --   --    < > = values in this interval not displayed.   GFR Estimated Creatinine Clearance: 67.7 mL/min (by C-G formula based on SCr of 1 mg/dL). Liver Function Tests: Recent Labs  Lab 03/16/20 1141 03/18/20 0904  AST 12* 17  ALT 12 18  ALKPHOS 62 61  BILITOT 0.3 0.5  PROT 6.9 7.0  ALBUMIN 2.3* 2.5*   No results for input(s): LIPASE, AMYLASE in the last 168 hours. No results for input(s): AMMONIA in the last 168 hours. Coagulation profile No results for input(s): INR, PROTIME in the last 168 hours. COVID-19 Labs  No results for input(s): DDIMER, FERRITIN, LDH, CRP in the last 72 hours.  Lab Results  Component Value Date   SARSCOV2NAA NEGATIVE 03/18/2020   SARSCOV2NAA NOT DETECTED 03/01/2019   [place] NEGATIVE 12/12/2018   SARSCOV2NAA NOT DETECTED 10/19/2018    CBC: Recent Labs  Lab 03/16/20 1141 03/18/20 0904 03/18/20 1131  WBC 10.1 10.0 9.3  NEUTROABS 7.7 7.4  --   HGB 7.7* 10.2* 10.7*  HCT 24.4* 34.2* 35.2*  MCV 91.0 94.2 93.9  PLT 215 215 197   Cardiac Enzymes: No results for input(s): CKTOTAL, CKMB, CKMBINDEX, TROPONINI in the last 168 hours. BNP (last 3 results) No results for input(s): PROBNP in the last 8760 hours. CBG: No results for  input(s): GLUCAP in the last 168 hours. D-Dimer: No results for input(s): DDIMER in the last 72 hours. Hgb A1c: No results for input(s): HGBA1C in the last 72 hours. Lipid Profile: No results for input(s): CHOL, HDL, LDLCALC, TRIG, CHOLHDL, LDLDIRECT in the last 72 hours. Thyroid function studies: Recent Labs    03/18/20 1133  TSH 1.179   Anemia work up: No results for input(s): VITAMINB12, FOLATE, FERRITIN, TIBC, IRON, RETICCTPCT in the last 72 hours. Sepsis Labs: Recent Labs  Lab 03/16/20 1141 03/18/20 0904 03/18/20 1005 03/18/20 1131  WBC 10.1 10.0  --  9.3  LATICACIDVEN  --   --  0.6 0.6   Microbiology Recent Results (from the past 240 hour(s))  Respiratory Panel by RT PCR (Flu A&B, Covid) - Nasopharyngeal Swab     Status: None   Collection Time: 03/18/20  9:21 AM   Specimen: Nasopharyngeal Swab  Result Value Ref Range Status  SARS Coronavirus 2 by RT PCR NEGATIVE NEGATIVE Final    Comment: (NOTE) SARS-CoV-2 target nucleic acids are NOT DETECTED.  The SARS-CoV-2 RNA is generally detectable in upper respiratoy specimens during the acute phase of infection. The lowest concentration of SARS-CoV-2 viral copies this assay can detect is 131 copies/mL. A negative result does not preclude SARS-Cov-2 infection and should not be used as the sole basis for treatment or other patient management decisions. A negative result may occur with  improper specimen collection/handling, submission of specimen other than nasopharyngeal swab, presence of viral mutation(s) within the areas targeted by this assay, and inadequate number of viral copies (<131 copies/mL). A negative result must be combined with clinical observations, patient history, and epidemiological information. The expected result is Negative.  Fact Sheet for Patients:  PinkCheek.be  Fact Sheet for Healthcare Providers:  GravelBags.it  This test is no t yet  approved or cleared by the [place] FDA and  has been authorized for detection and/or diagnosis of SARS-CoV-2 by FDA under an Emergency Use Authorization (EUA). This EUA will remain  in effect (meaning this test can be used) for the duration of the COVID-19 declaration under Section 564(b)(1) of the Act, 21 U.S.C. section 360bbb-3(b)(1), unless the authorization is terminated or revoked sooner.     Influenza A by PCR NEGATIVE NEGATIVE Final   Influenza B by PCR NEGATIVE NEGATIVE Final    Comment: (NOTE) The Xpert Xpress SARS-CoV-2/FLU/RSV assay is intended as an aid in  the diagnosis of influenza from Nasopharyngeal swab specimens and  should not be used as a sole basis for treatment. Nasal washings and  aspirates are unacceptable for Xpert Xpress SARS-CoV-2/FLU/RSV  testing.  Fact Sheet for Patients: PinkCheek.be  Fact Sheet for Healthcare Providers: GravelBags.it  This test is not yet approved or cleared by the [place] FDA and  has been authorized for detection and/or diagnosis of SARS-CoV-2 by  FDA under an Emergency Use Authorization (EUA). This EUA will remain  in effect (meaning this test can be used) for the duration of the  Covid-19 declaration under Section 564(b)(1) of the Act, 21  U.S.C. section 360bbb-3(b)(1), unless the authorization is  terminated or revoked. Performed at [facility], [place] [address], [place], [place] [address]   Blood culture (routine x 2)     Status: None (Preliminary result)   Collection Time: 03/18/20 10:05 AM   Specimen: BLOOD  Result Value Ref Range Status   Specimen Description BLOOD RIGHT ANTECUBITAL  Final   Special Requests   Final    BOTTLES DRAWN AEROBIC AND ANAEROBIC Blood Culture adequate volume   Culture   Final    NO GROWTH 2 DAYS Performed at [facility], [place] [address] [place], [place] [address]    Report Status PENDING  Incomplete  Blood  culture (routine x 2)     Status: None (Preliminary result)   Collection Time: 03/18/20 10:14 AM   Specimen: BLOOD LEFT ARM  Result Value Ref Range Status   Specimen Description BLOOD LEFT ARM  Final   Special Requests   Final    BOTTLES DRAWN AEROBIC AND ANAEROBIC Blood Culture adequate volume   Culture   Final    NO GROWTH 2 DAYS Performed at [facility], [place] [address], [place], [place] [address]    Report Status PENDING  Incomplete     Medications:   . aspirin EC  81 mg Oral Daily  . atorvastatin  40 mg  Oral Daily  . buPROPion  150 mg Oral Daily  . enoxaparin (LOVENOX) injection  40 mg Subcutaneous Q24H  . folic acid  1 mg Oral Daily  . furosemide  80 mg Intravenous BID  . mometasone-formoterol  2 puff Inhalation BID  . [START ON 03/21/2020] pneumococcal 23 valent vaccine  0.5 mL Intramuscular Tomorrow-1000  . sacubitril-valsartan  1 tablet Oral BID  . sodium chloride flush  3 mL Intravenous Q12H  . umeclidinium bromide  1 puff Inhalation Daily   Continuous Infusions: . sodium chloride        LOS: 2 days   Charlynne Cousins  [facility]  03/20/2020, 9:03 AM

## 2020-03-21 DIAGNOSIS — I25810 Atherosclerosis of coronary artery bypass graft(s) without angina pectoris: Secondary | ICD-10-CM | POA: Diagnosis not present

## 2020-03-21 DIAGNOSIS — R918 Other nonspecific abnormal finding of lung field: Secondary | ICD-10-CM | POA: Diagnosis not present

## 2020-03-21 DIAGNOSIS — E785 Hyperlipidemia, unspecified: Secondary | ICD-10-CM | POA: Diagnosis not present

## 2020-03-21 DIAGNOSIS — J9621 Acute and chronic respiratory failure with hypoxia: Secondary | ICD-10-CM | POA: Diagnosis not present

## 2020-03-21 DIAGNOSIS — Z515 Encounter for palliative care: Secondary | ICD-10-CM

## 2020-03-21 DIAGNOSIS — I5043 Acute on chronic combined systolic (congestive) and diastolic (congestive) heart failure: Secondary | ICD-10-CM | POA: Diagnosis not present

## 2020-03-21 DIAGNOSIS — J431 Panlobular emphysema: Secondary | ICD-10-CM

## 2020-03-21 LAB — BASIC METABOLIC PANEL
Anion gap: 9 (ref 5–15)
BUN: 15 mg/dL (ref 6–20)
CO2: 38 mmol/L — ABNORMAL HIGH (ref 22–32)
Calcium: 9.5 mg/dL (ref 8.9–10.3)
Chloride: 89 mmol/L — ABNORMAL LOW (ref 98–111)
Creatinine, Ser: 0.9 mg/dL (ref 0.61–1.24)
GFR, Estimated: >60 mL/min (ref >60)
Glucose, Bld: 97 mg/dL (ref 70–99)
Potassium: 3.8 mmol/L (ref 3.5–5.1)
Sodium: 136 mmol/L (ref 135–145)

## 2020-03-21 MED ORDER — HYDROCODONE-ACETAMINOPHEN 5-325 MG PO TABS: 1.0000 | ORAL_TABLET | Freq: Every day | ORAL | Status: DC

## 2020-03-21 MED ORDER — METOPROLOL SUCCINATE ER 25 MG PO TB24
12.5000 mg | ORAL_TABLET | Freq: Every day | ORAL | Status: DC
  Administered 2020-03-22 – 2020-03-23 (×2): 12.5 mg via ORAL
  Filled 2020-03-21 (×3): qty 1

## 2020-03-21 MED ORDER — FENTANYL CITRATE (PF) 100 MCG/2ML IJ SOLN
12.5000 ug | INTRAMUSCULAR | Status: DC | PRN
  Administered 2020-03-21 – 2020-03-29 (×25): 12.5 ug via INTRAVENOUS
  Filled 2020-03-21 (×26): qty 2

## 2020-03-21 MED ORDER — CARVEDILOL 3.125 MG PO TABS: 3.1250 mg | ORAL_TABLET | Freq: Two times a day (BID) | ORAL | Status: DC

## 2020-03-21 MED ORDER — FUROSEMIDE 80 MG PO TABS
80.0000 mg | ORAL_TABLET | Freq: Two times a day (BID) | ORAL | Status: DC
  Administered 2020-03-22: 80 mg via ORAL
  Filled 2020-03-21: qty 1

## 2020-03-21 NOTE — Progress Notes (Signed)
Pt sometimes wears CPAP at home at night. Pt has refused CPAP tonight. Will continue to monitor.

## 2020-03-21 NOTE — Progress Notes (Signed)
   03/20/20 1926  Assess: MEWS Score  Temp 98.4 F (36.9 C)  BP 90/64  Pulse Rate (!) 106  Resp 16  Level of Consciousness Alert  SpO2 100 %  O2 Device Nasal Cannula  O2 Flow Rate (L/min) 6 L/min  Assess: MEWS Score  MEWS Temp 0  MEWS Systolic 1  MEWS Pulse 1  MEWS RR 0  MEWS LOC 0  MEWS Score 2  MEWS Score Color Yellow  Assess: if the MEWS score is Yellow or Red  Were vital signs taken at a resting state? Yes  Focused Assessment No change from prior assessment  Early Detection of Sepsis Score *See Row Information* Low  MEWS guidelines implemented *See Row Information* Yes  Treat  MEWS Interventions Administered scheduled meds/treatments;Escalated (See documentation below)  Pain Scale 0-10  Pain Score 0  Take Vital Signs  Increase Vital Sign Frequency  Yellow: Q 2hr X 2 then Q 4hr X 2, if remains yellow, continue Q 4hrs  Escalate  MEWS: Escalate Yellow: discuss with charge nurse/RN and consider discussing with provider and RRT  Notify: Charge Nurse/RN  Name of Charge Nurse/RN Notified [place] RN  Date Charge Nurse/RN Notified 03/20/20  Time Charge Nurse/RN Notified 1930  Document  Patient Outcome Other (Comment) (Pt w/Hx of Hypotension)  Progress note created (see row info) Yes

## 2020-03-21 NOTE — Progress Notes (Signed)
Progress Note  Patient Name: [name] Date of Encounter: 03/21/2020  [facility] Cardiologist: Kirk Ruths, MD   Subjective   Feeling much better.  Breathing has improved substantially.  Just filled another urinal.   Inpatient Medications    Scheduled Meds: . aspirin EC  81 mg Oral Daily  . atorvastatin  40 mg Oral Daily  . buPROPion  150 mg Oral Daily  . carvedilol  3.125 mg Oral BID WC  . enoxaparin (LOVENOX) injection  40 mg Subcutaneous Q24H  . folic acid  1 mg Oral Daily  . furosemide  80 mg Intravenous BID  . mometasone-formoterol  2 puff Inhalation BID  . sacubitril-valsartan  1 tablet Oral BID  . sodium chloride flush  3 mL Intravenous Q12H  . umeclidinium bromide  1 puff Inhalation Daily   Continuous Infusions: . sodium chloride     PRN Meds: sodium chloride, acetaminophen, ipratropium, ondansetron (ZOFRAN) IV, prochlorperazine, sodium chloride flush   Vital Signs    Vitals:   03/21/20 0012 03/21/20 0404 03/21/20 0843 03/21/20 0849  BP: (!) 89/63 90/72 (!) 90/59   Pulse: (!) 101 98 99 (!) 101  Resp: 17 16  18   Temp: 98.3 F (36.8 C) 98 F (36.7 C) 97.7 F (36.5 C)   TempSrc: Oral Oral Oral   SpO2: 100% 100% 98% 94%  Weight:  54.9 kg    Height:        Intake/Output Summary (Last 24 hours) at 03/21/2020 1050 Last data filed at 03/21/2020 0400 Gross per 24 hour  Intake 120 ml  Output 4590 ml  Net -4470 ml   Last 3 Weights 03/21/2020 03/20/2020 03/19/2020  Weight (lbs) 121 lb 129 lb 8 oz 135 lb 12.9 oz  Weight (kg) 54.885 kg 58.741 kg 61.6 kg      Telemetry    Sinus rhythm, sinus tachycardia.  - Personally Reviewed  ECG   n/a  - Personally Reviewed  Physical Exam   VS:  BP (!) 90/59 (BP Location: Left Arm)   Pulse (!) 101   Temp 97.7 F (36.5 C) (Oral)   Resp 18   Ht 5' 7.99" (1.727 m)   Wt 54.9 kg Comment: scale B  SpO2 94%   BMI 18.40 kg/m  , BMI Body mass index is 18.4 kg/m. GENERAL:  Chronically ill-appearing. No  acute distress.  HEENT: Pupils equal round and reactive, fundi not visualized, oral mucosa unremarkable NECK:  No jugular venous distention, waveform within normal limits, carotid upstroke brisk and symmetric, no bruits LUNGS:  L lung field wheezing. No crackles.  HEART:  RRR.  PMI not displaced or sustained,S1 and S2 within normal limits, no S3, no S4, no clicks, no rubs, no murmurs ABD:  Flat, positive bowel sounds normal in frequency in pitch, no bruits, no rebound, no guarding, no midline pulsatile mass, no hepatomegaly, no splenomegaly EXT:  2 plus pulses throughout, no edema, no cyanosis no clubbing SKIN:  No rashes no nodules NEURO:  Cranial nerves II through XII grossly intact, motor grossly intact throughout PSYCH:  Cognitively intact, oriented to person place and time   Labs    High Sensitivity Troponin:   Recent Labs  Lab 03/18/20 0904 03/18/20 1131  TROPONINIHS 51* 63*      Chemistry Recent Labs  Lab 03/16/20 1141 03/16/20 1141 03/18/20 0904 03/18/20 1131 03/19/20 0240 03/20/20 0212 03/21/20 0328  NA 142   < > 139   < > 139 137 136  K 3.8   < >  4.9   < > 4.3 3.9 3.8  CL 105   < > 105   < > 99 95* 89*  CO2 31   < > 25   < > 34* 34* 38*  GLUCOSE 95   < > 113*   < > 132* 147* 97  BUN 11   < > 17   < > 16 16 15   CREATININE 0.84  --  1.10   < > 1.21 1.00 0.90  CALCIUM 8.9   < > 8.7*   < > 8.5* 9.0 9.5  PROT 6.9  --  7.0  --   --   --   --   ALBUMIN 2.3*  --  2.5*  --   --   --   --   AST 12*  --  17  --   --   --   --   ALT 12  --  18  --   --   --   --   ALKPHOS 62  --  61  --   --   --   --   BILITOT 0.3  --  0.5  --   --   --   --   GFRNONAA >60  --  >60   < > >60 >60 >60  ANIONGAP 6   < > 9   < > 6 8 9    < > = values in this interval not displayed.     Hematology Recent Labs  Lab 03/16/20 1141 03/18/20 0904 03/18/20 1131  WBC 10.1 10.0 9.3  RBC 2.68* 3.63* 3.75*  HGB 7.7* 10.2* 10.7*  HCT 24.4* 34.2* 35.2*  MCV 91.0 94.2 93.9  MCH 28.7 28.1  28.5  MCHC 31.6 29.8* 30.4  RDW 20.0* 19.4* 19.4*  PLT 215 215 197    BNP Recent Labs  Lab 03/18/20 0904  BNP 2,750.7*     DDimer No results for input(s): DDIMER in the last 168 hours.   Radiology    No results found.  Cardiac Studies   ECHO  03/18/2020:  1. Left ventricular ejection fraction, by estimation, is <20%. The left  ventricle has severely decreased function. The left ventricle demonstrates  regional wall motion abnormalities (see scoring diagram/findings for  description). The left ventricular  internal cavity size was moderately to severely dilated. There is mild  concentric left ventricular hypertrophy. Left ventricular diastolic  function could not be evaluated. There is diffuse severe hypokinesis with  akinesis of the distal to apical segments  of all walls.  2. Right ventricular systolic function was not well visualized. The right  ventricular size is not well visualized.  3. The mitral valve is normal in structure. Mild to moderate mitral valve  regurgitation.  4. The aortic valve is grossly normal. Aortic valve regurgitation is  mild. No aortic stenosis is present.   Comparison(s): Prior images unable to be directly viewed, comparison made  by report only.   Conclusion(s)/Recommendation(s): Severely reduced LVEF with both global  and focal wall motion abnormalities.   Patient Profile     59 y.o. male with CAD status post CABG, chronic systolic congestive heart failure secondary to ischemic cardiomyopathy, status post ICD, COPD, hypertension, hyperlipidemia, right lung adenocarcinoma, prostate adenocarcinoma, admitted with COPD exacerbation and acute on chronic systolic diastolic heart failure.  Assessment & Plan    # Acute on chronic systolic and diastolic heart failure:  Required BiPAP and IV lasix.  Baseline weight 50-53kg.  Today he is  down to 54.9kg from 58.7 kg yesterday.  He was net -4.4L yesterday and 8.4L this hospitalization.  Renal  function is stable.  Clinically he seems to be doing much better today and is nearly euvolemic.  He does have another liter of urine output.  We will switch his Lasix to oral.  Plan for 80mg  po bid to start tomorrow.  BP is low.  Continue Entresto.  Will switch carvedilol to metoprolol succinate.   Continue 2g sodium restriction.  He struggles with compliance with this diet.  He noted to nutrition that he has been eating fast food and convenience stores.  Appreciate nutrition seeing him.  He develops hyperkalemia with RAAS inhibition.  Consider adding Verquvo as an outpatient for acute HF event in patient with reduced systolic function.  # CAD s/p CABG:  # Hyperlipidemia:  Not an active issue.  Continue aspirin, atorvastatin, and switching to metoprolol succinate as above.  # Adenocarcinoma of prostate and R lung:  Chemotherapy with Dr. [name].   # COPD: On home inhalers.      For questions or updates, please contact [place] Please consult [email or web address] for contact info under        Signed, Skeet Latch, MD  03/21/2020, 10:50 AM
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
## 2020-03-21 NOTE — Progress Notes (Addendum)
[facility] PROGRESS NOTE    Progress Note  [name]  KDX:[id number] DOB: Nov 11, 1960 DOA: 03/18/2020 PCP: [name], MD     Brief Narrative:   [name] is an 59 y.o. male past medical history significant for coronary artery disease status post CABG, ischemic cardiomyopathy with an EF of 20% status post AICD placement, chronic hypoxic respiratory failure secondary to COPD on 4 L of oxygen, stage Ia non-small cell cancer diagnosed in February 2015 status post right lower lobe wedge resection as well as SBRT with a recurrent disease to the right lower lobe in June 2019 which has been in observation since then. Stage T1 adenocarcinoma of the prostate with a Gleason score of 4 diagnosed in November 2019 with a confirmed PET scan and biopsy of the right lower mass consistent with adenocarcinoma currently on chemotherapy, chemotherapy-induced anemia status post transfusion presents to the emergency room with shortness of breath for 2 weeks associated with lower extremity edema, orthopnea PND and a 20 pound weight gain, BNP of 2700 bilateral pleural effusions with pulmonary edema.  Assessment/Plan:   Acute on chronic combined systolic and diastolic CHF (congestive heart failure) ([place]): Usually weighs like around 50 to 53 kg, this morning is 55 kg. Still appears fluid overloaded on physical exam Has been weaned off the BiPAP now currently requiring 3 L of oxygen which is his baseline at home. Will continue current dose of IV Lasix. Continue to monitor electrolytes and replete as needed.   Creatinine is holding steady. He is currently on Lasix, Entresto and Coreg.  Coronary artery disease: Currently chest pain-free. Continue aspirin and statins.  Essential hypertension: Continue Entresto and IV Lasix.  Hyperlipidemia: Continue statins.  Prostate adenocarcinoma of the right lung: Follow-up with Dr. [name] for chemotherapy as an outpatient.  COPD (chronic obstructive  pulmonary disease) ([place]): Can continue inhalers currently no wheezing.  Depression/anxiety: Continue Wellbutrin.    DVT prophylaxis: lovenxo Family Communication:none Status is: Inpatient  Remains inpatient appropriate because:Hemodynamically unstable   Dispo: The patient is from: Home              Anticipated d/c is to: Home              Anticipated d/c date is: 2 days              Patient currently is not medically stable to d/c.  Once he is on minimal oxygen requirements.      Code Status:     Code Status Orders  (From admission, onward)         Start     Ordered   03/18/20 1126  Full code  Continuous        03/18/20 1126        Code Status History    Date Active Date Inactive Code Status Order ID Comments User Context   12/12/2018 2034 12/14/2018 1518 Full Code [id number]  [name], MD Inpatient   10/24/2017 0140 10/26/2017 1934 Full Code [id number]  [name], MD ED   03/23/2017 0429 03/26/2017 1710 Full Code [id number]  [name], MD ED   06/15/2015 0357 06/17/2015 1854 Full Code [id number]  [name], MD ED   08/07/2013 1900 08/16/2013 1737 Full Code [id number]  [name], PA-C Inpatient   07/18/2013 1253 07/19/2013 0343 Full Code [id number]  [name], MD [facility]   Advance Care Planning Activity        IV Access:    Peripheral IV  Procedures and diagnostic studies:   No results found.   Medical Consultants:    None.  Anti-Infectives:   none  Subjective:    [name] relates his breathing has significantly better, orthopnea has resolved.  Objective:    Vitals:   03/21/20 0012 03/21/20 0404 03/21/20 0843 03/21/20 0849  BP: (!) 89/63 90/72 (!) 90/59   Pulse: (!) 101 98 99 (!) 101  Resp: 17 16  18   Temp: 98.3 F (36.8 C) 98 F (36.7 C) 97.7 F (36.5 C)   TempSrc: Oral Oral Oral   SpO2: 100% 100% 98% 94%  Weight:  54.9 kg    Height:       SpO2: 94 % O2 Flow Rate (L/min): 3  L/min   Intake/Output Summary (Last 24 hours) at 03/21/2020 1033 Last data filed at 03/21/2020 0400 Gross per 24 hour  Intake 120 ml  Output 4590 ml  Net -4470 ml   Filed Weights   03/19/20 1000 03/20/20 0524 03/21/20 0404  Weight: 61.6 kg 58.7 kg 54.9 kg    Exam: General exam: In no acute distress. Respiratory system: Good air movement and clear to auscultation. Cardiovascular system: S1 & S2 heard, RRR.  Positive JVD Gastrointestinal system: Abdomen is nondistended, soft and nontender.  Extremities: No pedal edema. Skin: No rashes, lesions or ulcers Psychiatry: Judgement and insight appear normal. Mood & affect appropriate.  Data Reviewed:    Labs: Basic Metabolic Panel: Recent Labs  Lab 03/16/20 1141 03/16/20 1141 03/18/20 0904 03/18/20 0904 03/18/20 1131 03/19/20 0240 03/19/20 0240 03/20/20 0212 03/21/20 0328  NA 142  --  139  --   --  139  --  137 136  K 3.8   < > 4.9   < >  --  4.3   < > 3.9 3.8  CL 105  --  105  --   --  99  --  95* 89*  CO2 31  --  25  --   --  34*  --  34* 38*  GLUCOSE 95  --  113*  --   --  132*  --  147* 97  BUN 11  --  17  --   --  16  --  16 15  CREATININE 0.84  --  1.10  --  1.05 1.21  --  1.00 0.90  CALCIUM 8.9  --  8.7*  --   --  8.5*  --  9.0 9.5  MG  --   --   --   --  1.2*  --   --   --   --    < > = values in this interval not displayed.   GFR Estimated Creatinine Clearance: 70.3 mL/min (by C-G formula based on SCr of 0.9 mg/dL). Liver Function Tests: Recent Labs  Lab 03/16/20 1141 03/18/20 0904  AST 12* 17  ALT 12 18  ALKPHOS 62 61  BILITOT 0.3 0.5  PROT 6.9 7.0  ALBUMIN 2.3* 2.5*   No results for input(s): LIPASE, AMYLASE in the last 168 hours. No results for input(s): AMMONIA in the last 168 hours. Coagulation profile No results for input(s): INR, PROTIME in the last 168 hours. COVID-19 Labs  No results for input(s): DDIMER, FERRITIN, LDH, CRP in the last 72 hours.  Lab Results  Component Value Date    [place] NEGATIVE 03/18/2020   SARSCOV2NAA NOT DETECTED 03/01/2019   [place] NEGATIVE 12/12/2018   SARSCOV2NAA NOT DETECTED 10/19/2018    CBC: Recent  Labs  Lab 03/16/20 1141 03/18/20 0904 03/18/20 1131  WBC 10.1 10.0 9.3  NEUTROABS 7.7 7.4  --   HGB 7.7* 10.2* 10.7*  HCT 24.4* 34.2* 35.2*  MCV 91.0 94.2 93.9  PLT 215 215 197   Cardiac Enzymes: No results for input(s): CKTOTAL, CKMB, CKMBINDEX, TROPONINI in the last 168 hours. BNP (last 3 results) No results for input(s): PROBNP in the last 8760 hours. CBG: No results for input(s): GLUCAP in the last 168 hours. D-Dimer: No results for input(s): DDIMER in the last 72 hours. Hgb A1c: No results for input(s): HGBA1C in the last 72 hours. Lipid Profile: No results for input(s): CHOL, HDL, LDLCALC, TRIG, CHOLHDL, LDLDIRECT in the last 72 hours. Thyroid function studies: Recent Labs    03/18/20 1133  TSH 1.179   Anemia work up: No results for input(s): VITAMINB12, FOLATE, FERRITIN, TIBC, IRON, RETICCTPCT in the last 72 hours. Sepsis Labs: Recent Labs  Lab 03/16/20 1141 03/18/20 0904 03/18/20 1005 03/18/20 1131  WBC 10.1 10.0  --  9.3  LATICACIDVEN  --   --  0.6 0.6   Microbiology Recent Results (from the past 240 hour(s))  Respiratory Panel by RT PCR (Flu A&B, Covid) - Nasopharyngeal Swab     Status: None   Collection Time: 03/18/20  9:21 AM   Specimen: Nasopharyngeal Swab  Result Value Ref Range Status   SARS Coronavirus 2 by RT PCR NEGATIVE NEGATIVE Final    Comment: (NOTE) SARS-CoV-2 target nucleic acids are NOT DETECTED.  The SARS-CoV-2 RNA is generally detectable in upper respiratoy specimens during the acute phase of infection. The lowest concentration of SARS-CoV-2 viral copies this assay can detect is 131 copies/mL. A negative result does not preclude SARS-Cov-2 infection and should not be used as the sole basis for treatment or other patient management decisions. A negative result may occur  with  improper specimen collection/handling, submission of specimen other than nasopharyngeal swab, presence of viral mutation(s) within the areas targeted by this assay, and inadequate number of viral copies (<131 copies/mL). A negative result must be combined with clinical observations, patient history, and epidemiological information. The expected result is Negative.  Fact Sheet for Patients:  PinkCheek.be  Fact Sheet for Healthcare Providers:  GravelBags.it  This test is no t yet approved or cleared by the [place] FDA and  has been authorized for detection and/or diagnosis of SARS-CoV-2 by FDA under an Emergency Use Authorization (EUA). This EUA will remain  in effect (meaning this test can be used) for the duration of the COVID-19 declaration under Section 564(b)(1) of the Act, 21 U.S.C. section 360bbb-3(b)(1), unless the authorization is terminated or revoked sooner.     Influenza A by PCR NEGATIVE NEGATIVE Final   Influenza B by PCR NEGATIVE NEGATIVE Final    Comment: (NOTE) The Xpert Xpress SARS-CoV-2/FLU/RSV assay is intended as an aid in  the diagnosis of influenza from Nasopharyngeal swab specimens and  should not be used as a sole basis for treatment. Nasal washings and  aspirates are unacceptable for Xpert Xpress SARS-CoV-2/FLU/RSV  testing.  Fact Sheet for Patients: PinkCheek.be  Fact Sheet for Healthcare Providers: GravelBags.it  This test is not yet approved or cleared by the [place] FDA and  has been authorized for detection and/or diagnosis of SARS-CoV-2 by  FDA under an Emergency Use Authorization (EUA). This EUA will remain  in effect (meaning this test can be used) for the duration of the  Covid-19 declaration under Section 564(b)(1) of the Act,  21  U.S.C. section 360bbb-3(b)(1), unless the authorization is  terminated or  revoked. Performed at [facility], [place] [address], [place], [place] [address]   Blood culture (routine x 2)     Status: None (Preliminary result)   Collection Time: 03/18/20 10:05 AM   Specimen: BLOOD  Result Value Ref Range Status   Specimen Description BLOOD RIGHT ANTECUBITAL  Final   Special Requests   Final    BOTTLES DRAWN AEROBIC AND ANAEROBIC Blood Culture adequate volume   Culture   Final    NO GROWTH 3 DAYS Performed at [facility], [place] [address], [place], [place] [address]    Report Status PENDING  Incomplete  Blood culture (routine x 2)     Status: None (Preliminary result)   Collection Time: 03/18/20 10:14 AM   Specimen: BLOOD LEFT ARM  Result Value Ref Range Status   Specimen Description BLOOD LEFT ARM  Final   Special Requests   Final    BOTTLES DRAWN AEROBIC AND ANAEROBIC Blood Culture adequate volume   Culture   Final    NO GROWTH 3 DAYS Performed at [facility], [place] [address] [place], [place] [address]    Report Status PENDING  Incomplete     Medications:   . aspirin EC  81 mg Oral Daily  . atorvastatin  40 mg Oral Daily  . buPROPion  150 mg Oral Daily  . enoxaparin (LOVENOX) injection  40 mg Subcutaneous Q24H  . folic acid  1 mg Oral Daily  . furosemide  80 mg Intravenous BID  . mometasone-formoterol  2 puff Inhalation BID  . sacubitril-valsartan  1 tablet Oral BID  . sodium chloride flush  3 mL Intravenous Q12H  . umeclidinium bromide  1 puff Inhalation Daily   Continuous Infusions: . sodium chloride        LOS: 3 days   Charlynne Cousins  [facility]  03/21/2020, 10:33 AM

## 2020-03-21 NOTE — Progress Notes (Signed)
Patient's blood pressure too soft for hydrocodone PRN.  Will try lowest dose fentanyl.  If he tolerates fentanyl pushes well then perhaps we can try a transdermal patch for his pain.  Florentina Jenny, PA-C Palliative Medicine Office:  [phone]

## 2020-03-21 NOTE — Progress Notes (Signed)
Assume care from previous RN.

## 2020-03-22 DIAGNOSIS — G8929 Other chronic pain: Secondary | ICD-10-CM

## 2020-03-22 DIAGNOSIS — M545 Low back pain, unspecified: Secondary | ICD-10-CM

## 2020-03-22 DIAGNOSIS — I5043 Acute on chronic combined systolic (congestive) and diastolic (congestive) heart failure: Secondary | ICD-10-CM | POA: Diagnosis not present

## 2020-03-22 DIAGNOSIS — Z515 Encounter for palliative care: Secondary | ICD-10-CM | POA: Diagnosis not present

## 2020-03-22 LAB — BASIC METABOLIC PANEL
Anion gap: 16 — ABNORMAL HIGH (ref 5–15)
BUN: 20 mg/dL (ref 6–20)
CO2: 25 mmol/L (ref 22–32)
Calcium: 9 mg/dL (ref 8.9–10.3)
Chloride: 93 mmol/L — ABNORMAL LOW (ref 98–111)
Creatinine, Ser: 0.89 mg/dL (ref 0.61–1.24)
GFR, Estimated: >60 mL/min (ref >60)
Glucose, Bld: 94 mg/dL (ref 70–99)
Potassium: 4.5 mmol/L (ref 3.5–5.1)
Sodium: 134 mmol/L — ABNORMAL LOW (ref 135–145)

## 2020-03-22 LAB — GLUCOSE, CAPILLARY: Glucose-Capillary: 79 mg/dL (ref 70–99)

## 2020-03-22 MED ORDER — LIDOCAINE 5 % EX PTCH
1.0000 | MEDICATED_PATCH | CUTANEOUS | Status: DC
  Administered 2020-03-22 – 2020-04-10 (×20): 1 via TRANSDERMAL
  Filled 2020-03-22 (×18): qty 1

## 2020-03-22 MED ORDER — SODIUM CHLORIDE 0.9 % IV BOLUS
500.0000 mL | Freq: Once | INTRAVENOUS | Status: AC
  Administered 2020-03-22: 500 mL via INTRAVENOUS

## 2020-03-22 MED ORDER — DICLOFENAC SODIUM 1 % EX GEL
4.0000 g | Freq: Every day | CUTANEOUS | Status: DC
  Administered 2020-03-22 – 2020-04-10 (×18): 4 g via TOPICAL
  Filled 2020-03-22 (×2): qty 100

## 2020-03-22 NOTE — Progress Notes (Signed)
[facility] PROGRESS NOTE    Progress Note  [name]  ZOX:[id number] DOB: Jun 16, 1960 DOA: 03/18/2020 PCP: [name], MD     Brief Narrative:   [name] is an 59 y.o. male past medical history significant for coronary artery disease status post CABG, ischemic cardiomyopathy with an EF of 20% status post AICD placement, chronic hypoxic respiratory failure secondary to COPD on 4 L of oxygen, stage Ia non-small cell cancer diagnosed in February 2015 status post right lower lobe wedge resection as well as SBRT with a recurrent disease to the right lower lobe in June 2019 which has been in observation since then. Stage T1 adenocarcinoma of the prostate with a Gleason score of 4 diagnosed in November 2019 with a confirmed PET scan and biopsy of the right lower mass consistent with adenocarcinoma currently on chemotherapy, chemotherapy-induced anemia status post transfusion presents to the emergency room with shortness of breath for 2 weeks associated with lower extremity edema, orthopnea PND and a 20 pound weight gain, BNP of 2700 bilateral pleural effusions with pulmonary edema.  Assessment/Plan:   Acute on chronic combined systolic and diastolic CHF (congestive heart failure) ([place]): Usually weighs like around 50 to 53 kg, this morning is 54 kg. Change to oral diuretic therapy today. Liters euvolemic physical exam now on 3 L of oxygen. Continue Lasix, Entresto and Coreg. Social worker to meet with him to try to change his Medicaid to [place] as he lives in [place].  Coronary artery disease: Currently chest pain-free. Continue aspirin and statins.  Essential hypertension: Continue Entresto and IV Lasix.  Hyperlipidemia: Continue statins.  Prostate adenocarcinoma of the right lung: Follow-up with Dr. [name] for chemotherapy as an outpatient.  COPD (chronic obstructive pulmonary disease) ([place]): Can continue inhalers currently no  wheezing.  Depression/anxiety: Continue Wellbutrin.    DVT prophylaxis: lovenOX Family Communication:none Status is: Inpatient  Remains inpatient appropriate because:Hemodynamically unstable   Dispo: The patient is from: Home              Anticipated d/c is to: Home              Anticipated d/c date is: 1 days              Patient currently is not medically stable to d/c.  Hopefully home tomorrow.      Code Status:     Code Status Orders  (From admission, onward)         Start     Ordered   03/18/20 1126  Full code  Continuous        03/18/20 1126        Code Status History    Date Active Date Inactive Code Status Order ID Comments User Context   12/12/2018 2034 12/14/2018 1518 Full Code [id number]  [name], MD Inpatient   10/24/2017 0140 10/26/2017 1934 Full Code [id number]  [name], MD ED   03/23/2017 0429 03/26/2017 1710 Full Code [id number]  [name], MD ED   06/15/2015 0357 06/17/2015 1854 Full Code [id number]  [name], MD ED   08/07/2013 1900 08/16/2013 1737 Full Code [id number]  [name], PA-C Inpatient   07/18/2013 1253 07/19/2013 0343 Full Code [id number]  [name], MD [facility]   Advance Care Planning Activity        IV Access:    Peripheral IV   Procedures and diagnostic studies:   No results found.   Medical Consultants:    None.  Anti-Infectives:  none  Subjective:    Max Tilley breathing back to baseline no new complaints.  Objective:    Vitals:   03/21/20 2126 03/22/20 0005 03/22/20 0303 03/22/20 0745  BP:  91/70 (!) 86/63 104/71  Pulse:  (!) 105 (!) 103   Resp:  17 18   Temp:  98.1 F (36.7 C) 98.4 F (36.9 C) 98.1 F (36.7 C)  TempSrc:  Oral Oral Oral  SpO2: 95% 100% 98% 98%  Weight:   54.3 kg   Height:       SpO2: 98 % O2 Flow Rate (L/min): 3 L/min   Intake/Output Summary (Last 24 hours) at 03/22/2020 0954 Last data filed at 03/22/2020 0300 Gross per 24 hour   Intake 480 ml  Output 1950 ml  Net -1470 ml   Filed Weights   03/20/20 0524 03/21/20 0404 03/22/20 0303  Weight: 58.7 kg 54.9 kg 54.3 kg    Exam: General exam: In no acute distress. Respiratory system: Good air movement and clear to auscultation. Cardiovascular system: S1 & S2 heard, RRR. No JVD. Gastrointestinal system: Abdomen is nondistended, soft and nontender.  Extremities: No pedal edema. Skin: No rashes, lesions or ulcers  Data Reviewed:    Labs: Basic Metabolic Panel: Recent Labs  Lab 03/18/20 0904 03/18/20 0904 03/18/20 1131 03/19/20 0240 03/19/20 0240 03/20/20 0212 03/20/20 0212 03/21/20 0328 03/22/20 0425  NA 139  --   --  139  --  137  --  136 134*  K 4.9   < >  --  4.3   < > 3.9   < > 3.8 4.5  CL 105  --   --  99  --  95*  --  89* 93*  CO2 25  --   --  34*  --  34*  --  38* 25  GLUCOSE 113*  --   --  132*  --  147*  --  97 94  BUN 17  --   --  16  --  16  --  15 20  CREATININE 1.10   < > 1.05 1.21  --  1.00  --  0.90 0.89  CALCIUM 8.7*  --   --  8.5*  --  9.0  --  9.5 9.0  MG  --   --  1.2*  --   --   --   --   --   --    < > = values in this interval not displayed.   GFR Estimated Creatinine Clearance: 70.3 mL/min (by C-G formula based on SCr of 0.89 mg/dL). Liver Function Tests: Recent Labs  Lab 03/16/20 1141 03/18/20 0904  AST 12* 17  ALT 12 18  ALKPHOS 62 61  BILITOT 0.3 0.5  PROT 6.9 7.0  ALBUMIN 2.3* 2.5*   No results for input(s): LIPASE, AMYLASE in the last 168 hours. No results for input(s): AMMONIA in the last 168 hours. Coagulation profile No results for input(s): INR, PROTIME in the last 168 hours. COVID-19 Labs  No results for input(s): DDIMER, FERRITIN, LDH, CRP in the last 72 hours.  Lab Results  Component Value Date   SARSCOV2NAA NEGATIVE 03/18/2020   SARSCOV2NAA NOT DETECTED 03/01/2019   [place] NEGATIVE 12/12/2018   SARSCOV2NAA NOT DETECTED 10/19/2018    CBC: Recent Labs  Lab 03/16/20 1141 03/18/20 0904  03/18/20 1131  WBC 10.1 10.0 9.3  NEUTROABS 7.7 7.4  --   HGB 7.7* 10.2* 10.7*  HCT 24.4* 34.2* 35.2*  MCV 91.0  94.2 93.9  PLT 215 215 197   Cardiac Enzymes: No results for input(s): CKTOTAL, CKMB, CKMBINDEX, TROPONINI in the last 168 hours. BNP (last 3 results) No results for input(s): PROBNP in the last 8760 hours. CBG: No results for input(s): GLUCAP in the last 168 hours. D-Dimer: No results for input(s): DDIMER in the last 72 hours. Hgb A1c: No results for input(s): HGBA1C in the last 72 hours. Lipid Profile: No results for input(s): CHOL, HDL, LDLCALC, TRIG, CHOLHDL, LDLDIRECT in the last 72 hours. Thyroid function studies: No results for input(s): TSH, T4TOTAL, T3FREE, THYROIDAB in the last 72 hours.  Invalid input(s): FREET3 Anemia work up: No results for input(s): VITAMINB12, FOLATE, FERRITIN, TIBC, IRON, RETICCTPCT in the last 72 hours. Sepsis Labs: Recent Labs  Lab 03/16/20 1141 03/18/20 0904 03/18/20 1005 03/18/20 1131  WBC 10.1 10.0  --  9.3  LATICACIDVEN  --   --  0.6 0.6   Microbiology Recent Results (from the past 240 hour(s))  Respiratory Panel by RT PCR (Flu A&B, Covid) - Nasopharyngeal Swab     Status: None   Collection Time: 03/18/20  9:21 AM   Specimen: Nasopharyngeal Swab  Result Value Ref Range Status   SARS Coronavirus 2 by RT PCR NEGATIVE NEGATIVE Final    Comment: (NOTE) SARS-CoV-2 target nucleic acids are NOT DETECTED.  The SARS-CoV-2 RNA is generally detectable in upper respiratoy specimens during the acute phase of infection. The lowest concentration of SARS-CoV-2 viral copies this assay can detect is 131 copies/mL. A negative result does not preclude SARS-Cov-2 infection and should not be used as the sole basis for treatment or other patient management decisions. A negative result may occur with  improper specimen collection/handling, submission of specimen other than nasopharyngeal swab, presence of viral mutation(s) within  the areas targeted by this assay, and inadequate number of viral copies (<131 copies/mL). A negative result must be combined with clinical observations, patient history, and epidemiological information. The expected result is Negative.  Fact Sheet for Patients:  PinkCheek.be  Fact Sheet for Healthcare Providers:  GravelBags.it  This test is no t yet approved or cleared by the [place] FDA and  has been authorized for detection and/or diagnosis of SARS-CoV-2 by FDA under an Emergency Use Authorization (EUA). This EUA will remain  in effect (meaning this test can be used) for the duration of the COVID-19 declaration under Section 564(b)(1) of the Act, 21 U.S.C. section 360bbb-3(b)(1), unless the authorization is terminated or revoked sooner.     Influenza A by PCR NEGATIVE NEGATIVE Final   Influenza B by PCR NEGATIVE NEGATIVE Final    Comment: (NOTE) The Xpert Xpress SARS-CoV-2/FLU/RSV assay is intended as an aid in  the diagnosis of influenza from Nasopharyngeal swab specimens and  should not be used as a sole basis for treatment. Nasal washings and  aspirates are unacceptable for Xpert Xpress SARS-CoV-2/FLU/RSV  testing.  Fact Sheet for Patients: PinkCheek.be  Fact Sheet for Healthcare Providers: GravelBags.it  This test is not yet approved or cleared by the [place] FDA and  has been authorized for detection and/or diagnosis of SARS-CoV-2 by  FDA under an Emergency Use Authorization (EUA). This EUA will remain  in effect (meaning this test can be used) for the duration of the  Covid-19 declaration under Section 564(b)(1) of the Act, 21  U.S.C. section 360bbb-3(b)(1), unless the authorization is  terminated or revoked. Performed at [facility], [place] [address] [place], [place] [address]   Blood  culture (routine x 2)     Status: None  (Preliminary result)   Collection Time: 03/18/20 10:05 AM   Specimen: BLOOD  Result Value Ref Range Status   Specimen Description BLOOD RIGHT ANTECUBITAL  Final   Special Requests   Final    BOTTLES DRAWN AEROBIC AND ANAEROBIC Blood Culture adequate volume   Culture   Final    NO GROWTH 3 DAYS Performed at [facility], [address] [place], [place] [address]    Report Status PENDING  Incomplete  Blood culture (routine x 2)     Status: None (Preliminary result)   Collection Time: 03/18/20 10:14 AM   Specimen: BLOOD LEFT ARM  Result Value Ref Range Status   Specimen Description BLOOD LEFT ARM  Final   Special Requests   Final    BOTTLES DRAWN AEROBIC AND ANAEROBIC Blood Culture adequate volume   Culture   Final    NO GROWTH 3 DAYS Performed at [facility], [place] [address] [place], [place] [address]    Report Status PENDING  Incomplete     Medications:   . aspirin EC  81 mg Oral Daily  . atorvastatin  40 mg Oral Daily  . buPROPion  150 mg Oral Daily  . enoxaparin (LOVENOX) injection  40 mg Subcutaneous Q24H  . folic acid  1 mg Oral Daily  . furosemide  80 mg Oral BID  . metoprolol succinate  12.5 mg Oral Daily  . mometasone-formoterol  2 puff Inhalation BID  . sacubitril-valsartan  1 tablet Oral BID  . sodium chloride flush  3 mL Intravenous Q12H  . umeclidinium bromide  1 puff Inhalation Daily   Continuous Infusions: . sodium chloride        LOS: 4 days   Charlynne Cousins  [facility]  03/22/2020, 9:54 AM

## 2020-03-22 NOTE — Progress Notes (Signed)
   03/22/20 1916  Assess: MEWS Score  BP (!) 77/61  Pulse Rate 97  SpO2 98 %  Assess: MEWS Score  MEWS Temp 0  MEWS Systolic 2  MEWS Pulse 0  MEWS RR 0  MEWS LOC 0  MEWS Score 2  MEWS Score Color Yellow  Assess: if the MEWS score is Yellow or Red  Were vital signs taken at a resting state? Yes  Focused Assessment No change from prior assessment  Early Detection of Sepsis Score *See Row Information* Low  MEWS guidelines implemented *See Row Information* No, vital signs rechecked  Treat  MEWS Interventions Escalated (See documentation below)  Escalate  MEWS: Escalate Yellow: discuss with charge nurse/RN and consider discussing with provider and RRT  Notify: Provider  Provider Name/Title Opyd MD  Date Provider Notified 03/22/20  Time Provider Notified 1950  Notification Type Page  Notification Reason Change in status  Response See new orders (Notify if symptomatic and/or if MAP < 60)  Date of Provider Response 03/22/20  Time of Provider Response 1954  Document  Patient Outcome Not stable and remains on department  Progress note created (see row info) Yes

## 2020-03-22 NOTE — Progress Notes (Addendum)
Patient is experiencing low blood pressure with a MAP of 67 and perfuse diaeresis with a CBG of 79  Pt  Alert and oriented and "feels cold" temp is adjusted to warm room temp, as well as a heating blanket,  Bed set in [place] position, Frequent Vitals started.With a Bolus of fluid wide open.

## 2020-03-22 NOTE — Progress Notes (Signed)
Palliative Medicine Inpatient Follow Up Note   Reason for Consultation: Establishing goals of care  HPI/Patient Profile: 59 y.o. male  with past medical history of CAD s/p CABG, CHF with EF < 20% ICD in place, COPD on 4L, lung cancer in 2015 and again in 2019 he has been on palliative chemo until his recent visit with Dr. [name] who recommended pausing chemo due to intolerance, and prostate cancer diagnosed in 03/2018 who was admitted on 03/18/2020 with volume overload and decompensated heart failure.  He is feeling better with aggressive diuresis.  PMT was asked to see for goals of care.  Today's Discussion (03/22/2020): Chart reviewed.  I checked in with staff on this morning in regards to his pain management.  He states that he had received a dose of fentanyl although he cannot account for any significant change in his lower back pain.  He does feel that the K pad is helpful.  He endorses that the pain is worse with mobility and if he stays in the same position it seems to be relieved. I shared with him that sometimes mobilizing actually ends up helping patients with pain it just take a few minutes on your feet to feel the difference.  He understood this.   We talked about starting a Lidoderm patch to see if that would make any degree of improvement which he was in favor of.  We also talked about starting   a local nonsteroidal gel when the Lidoderm patch was not in use which he was also in favor of.I shared with him that I am not sure if it will make much of a difference but at least it would give [place] the opportunity to see if a less aggressive medication would benefit him.    He otherwise denies any questions concerns or additional complaints.  I shared with him that my colleague   Haynes Dage would be back tomorrow to follow-up in regards to how he is doing.  Discussed the importance of continued conversation with family and their  medical providers regarding overall plan of care and  treatment options, ensuring decisions are within the context of the patients values and GOCs.  Objective Assessment: Vital Signs Vitals:   03/22/20 0745 03/22/20 1039  BP: 104/71   Pulse:  (!) 107  Resp:  16  Temp: 98.1 F (36.7 C)   SpO2: 98% 97%    Intake/Output Summary (Last 24 hours) at 03/22/2020 1044 Last data filed at 03/22/2020 0300 Gross per 24 hour  Intake 480 ml  Output 1950 ml  Net -1470 ml   Last Weight  Most recent update: 03/22/2020  3:06 AM   Weight  54.3 kg (119 lb 11.2 oz)           Gen:  Thin AA M in NAD HEENT: Dry mucous membranes CV: Irregular rate and regular rhythm  PULM: On 4LPM [place], (+)wheezing in L lung field ABD: soft  Neuro: Alert and oriented x4  SUMMARY OF RECOMMENDATIONS   DNI  Patient would want his mother as his healthcare decision making surrogate. If patient is willing we will arrange a family meeting with his mother  [employer] consult requested.  Patient needs help with Medicare in [place] and [place] as well as establishing medical providers in [place].  Palliative care will follow up again tomorrow.  Strongly recommend outpatient palliative care follow-up  Symptoms: Lower Back Pain: Continue K pad for back pain as needed Continue low-dose fentanyl 12.65mcg Q2H (BP too low  to tolerate hydrocodone) --> only received x1 dose since started Added Lidoderm patch Added Voltaren gel Recommended mobilizing as able  Time Spent: 25 Greater than 50% of the time was spent in counseling and coordination of care ______________________________________________________________________________________ [place] Team Team Cell Phone: [phone] Please utilize secure chat with additional questions, if there is no response within 30 minutes please call the above phone number  Palliative Medicine Team providers are available by phone from 7am to 7pm daily and can be reached through the team cell  phone.  Should this patient require assistance outside of these hours, please call the patient's attending physician.

## 2020-03-22 NOTE — Progress Notes (Signed)
This note also relates to the following rows which could not be included: ECG Heart Rate - Cannot attach notes to unvalidated device data    03/22/20 1717  Assess: MEWS Score  Temp 98.3 F (36.8 C)  BP (!) 88/63  Resp 20  Level of Consciousness Alert  SpO2 98 %  O2 Device Nasal Cannula  O2 Flow Rate (L/min) 2 L/min  Assess: if the MEWS score is Yellow or Red  Were vital signs taken at a resting state? Yes  Treat  Pain Scale 0-10  Pain Score 3

## 2020-03-22 NOTE — Progress Notes (Signed)
   03/22/20 1630  Assess: MEWS Score  BP (!) 79/66  Pulse Rate (!) 105  Assess: MEWS Score  MEWS Temp 0  MEWS Systolic 2  MEWS Pulse 1  MEWS RR 0  MEWS LOC 0  MEWS Score 3  MEWS Score Color Yellow  Assess: if the MEWS score is Yellow or Red  Were vital signs taken at a resting state? Yes  Focused Assessment Change from prior assessment (see assessment flowsheet)  Early Detection of Sepsis Score *See Row Information* Low  MEWS guidelines implemented *See Row Information* Yes  Treat  MEWS Interventions Escalated (See documentation below)  Pain Scale 0-10  Take Vital Signs  Increase Vital Sign Frequency  Yellow: Q 2hr X 2 then Q 4hr X 2, if remains yellow, continue Q 4hrs  Escalate  MEWS: Escalate Yellow: discuss with charge nurse/RN and consider discussing with provider and RRT  Notify: Charge Nurse/RN  Name of Charge Nurse/RN Notified Eun Rn  Date Charge Nurse/RN Notified 03/22/20  Time Charge Nurse/RN Notified 1600  Notify: Provider  Provider Name/Title Dr. Governor Specking  Date Provider Notified 03/22/20  Time Provider Notified 1550  Notification Type Call  Notification Reason Change in status  Response See new orders  Date of Provider Response 03/22/20  Time of Provider Response 1550  Notify: Rapid Response  Name of Rapid Response RN Notified n/a  Document  Patient Outcome Stabilized after interventions  Progress note created (see row info) Yes  Pt MAP is stabilizing after intervention.

## 2020-03-22 NOTE — Progress Notes (Signed)
Progress Note  Patient Name: [name] Date of Encounter: 03/22/2020  [facility] Cardiologist: Kirk Ruths, MD   Subjective   Complains of pain in his R shoulder that feels like a pulled muscle.  It has been present since his port was placed.   Breathing is stable.  No chest pain.   Inpatient Medications    Scheduled Meds: . aspirin EC  81 mg Oral Daily  . atorvastatin  40 mg Oral Daily  . buPROPion  150 mg Oral Daily  . enoxaparin (LOVENOX) injection  40 mg Subcutaneous Q24H  . folic acid  1 mg Oral Daily  . furosemide  80 mg Oral BID  . metoprolol succinate  12.5 mg Oral Daily  . mometasone-formoterol  2 puff Inhalation BID  . sacubitril-valsartan  1 tablet Oral BID  . sodium chloride flush  3 mL Intravenous Q12H  . umeclidinium bromide  1 puff Inhalation Daily   Continuous Infusions: . sodium chloride     PRN Meds: sodium chloride, acetaminophen, fentaNYL (SUBLIMAZE) injection, ipratropium, ondansetron (ZOFRAN) IV, prochlorperazine, sodium chloride flush   Vital Signs    Vitals:   03/21/20 2126 03/22/20 0005 03/22/20 0303 03/22/20 0745  BP:  91/70 (!) 86/63 104/71  Pulse:  (!) 105 (!) 103   Resp:  17 18   Temp:  98.1 F (36.7 C) 98.4 F (36.9 C) 98.1 F (36.7 C)  TempSrc:  Oral Oral Oral  SpO2: 95% 100% 98% 98%  Weight:   54.3 kg   Height:        Intake/Output Summary (Last 24 hours) at 03/22/2020 0933 Last data filed at 03/22/2020 0300 Gross per 24 hour  Intake 480 ml  Output 1950 ml  Net -1470 ml   Last 3 Weights 03/22/2020 03/21/2020 03/20/2020  Weight (lbs) 119 lb 11.2 oz 121 lb 129 lb 8 oz  Weight (kg) 54.296 kg 54.885 kg 58.741 kg      Telemetry    Sinus rhythm, sinus tachycardia.  - Personally Reviewed  ECG   n/a  - Personally Reviewed  Physical Exam   VS:  BP 104/71 (BP Location: Left Arm)   Pulse (!) 103   Temp 98.1 F (36.7 C) (Oral)   Resp 18   Ht 5' 7.99" (1.727 m)   Wt 54.3 kg   SpO2 98%   BMI 18.20 kg/m  ,  BMI Body mass index is 18.2 kg/m. GENERAL:  Chronically ill-appearing. No acute distress.  HEENT: Pupils equal round and reactive, fundi not visualized, oral mucosa unremarkable NECK:  No jugular venous distention, waveform within normal limits, carotid upstroke brisk and symmetric, no bruits LUNGS:  L lung field wheezing. No crackles.  HEART:  RRR.  PMI not displaced or sustained,S1 and S2 within normal limits, no S3, no S4, no clicks, no rubs, no murmurs ABD:  Flat, positive bowel sounds normal in frequency in pitch, no bruits, no rebound, no guarding, no midline pulsatile mass, no hepatomegaly, no splenomegaly EXT:  2 plus pulses throughout, no edema, no cyanosis no clubbing SKIN:  No rashes no nodules NEURO:  Cranial nerves II through XII grossly intact, motor grossly intact throughout PSYCH:  Cognitively intact, oriented to person place and time   Labs    High Sensitivity Troponin:   Recent Labs  Lab 03/18/20 0904 03/18/20 1131  TROPONINIHS 51* 63*      Chemistry Recent Labs  Lab 03/16/20 1141 03/16/20 1141 03/18/20 0539 03/18/20 1131 03/20/20 7673 03/21/20 0328 03/22/20 0425  NA 142   < > 139   < > 137 136 134*  K 3.8   < > 4.9   < > 3.9 3.8 4.5  CL 105   < > 105   < > 95* 89* 93*  CO2 31   < > 25   < > 34* 38* 25  GLUCOSE 95   < > 113*   < > 147* 97 94  BUN 11   < > 17   < > 16 15 20   CREATININE 0.84  --  1.10   < > 1.00 0.90 0.89  CALCIUM 8.9   < > 8.7*   < > 9.0 9.5 9.0  PROT 6.9  --  7.0  --   --   --   --   ALBUMIN 2.3*  --  2.5*  --   --   --   --   AST 12*  --  17  --   --   --   --   ALT 12  --  18  --   --   --   --   ALKPHOS 62  --  61  --   --   --   --   BILITOT 0.3  --  0.5  --   --   --   --   GFRNONAA >60  --  >60   < > >60 >60 >60  ANIONGAP 6   < > 9   < > 8 9 16*   < > = values in this interval not displayed.     Hematology Recent Labs  Lab 03/16/20 1141 03/18/20 0904 03/18/20 1131  WBC 10.1 10.0 9.3  RBC 2.68* 3.63* 3.75*  HGB 7.7*  10.2* 10.7*  HCT 24.4* 34.2* 35.2*  MCV 91.0 94.2 93.9  MCH 28.7 28.1 28.5  MCHC 31.6 29.8* 30.4  RDW 20.0* 19.4* 19.4*  PLT 215 215 197    BNP Recent Labs  Lab 03/18/20 0904  BNP 2,750.7*     DDimer No results for input(s): DDIMER in the last 168 hours.   Radiology    No results found.  Cardiac Studies   ECHO  03/18/2020:  1. Left ventricular ejection fraction, by estimation, is <20%. The left  ventricle has severely decreased function. The left ventricle demonstrates  regional wall motion abnormalities (see scoring diagram/findings for  description). The left ventricular  internal cavity size was moderately to severely dilated. There is mild  concentric left ventricular hypertrophy. Left ventricular diastolic  function could not be evaluated. There is diffuse severe hypokinesis with  akinesis of the distal to apical segments  of all walls.  2. Right ventricular systolic function was not well visualized. The right  ventricular size is not well visualized.  3. The mitral valve is normal in structure. Mild to moderate mitral valve  regurgitation.  4. The aortic valve is grossly normal. Aortic valve regurgitation is  mild. No aortic stenosis is present.   Comparison(s): Prior images unable to be directly viewed, comparison made  by report only.   Conclusion(s)/Recommendation(s): Severely reduced LVEF with both global  and focal wall motion abnormalities.   Patient Profile     59 y.o. male with CAD status post CABG, chronic systolic congestive heart failure secondary to ischemic cardiomyopathy, status post ICD, COPD, hypertension, hyperlipidemia, right lung adenocarcinoma, prostate adenocarcinoma, admitted with COPD exacerbation and acute on chronic systolic diastolic heart failure.  Assessment & Plan    # Acute  on chronic systolic and diastolic heart failure:  LVEF <20%.  He required BiPAP and IV lasix.  Baseline weight 50-53kg. His weight is down to 54.3 kg.  He was net -1.4L yesterday, down 9.9L since admission.  Volume status is stable and he is transitioning to oral lasix. Renal function is stable.  Clinically he seems to be doing much better today and is nearly euvolemic.   Continue Entresto.  Will switch carvedilol to metoprolol succinate.   Continue 2g sodium restriction.  He struggles with compliance with this diet.  He noted to nutrition that he has been eating fast food and convenience stores.  Appreciate nutrition seeing him.  He develops hyperkalemia with RAAS inhibition.  Consider adding Verquvo as an outpatient for acute HF event in patient with reduced systolic function.  # CAD s/p CABG:  # Hyperlipidemia:  Not an active issue.  Continue aspirin, atorvastatin, and switching to metoprolol succinate as above.  # Adenocarcinoma of prostate and R lung:  Chemotherapy with Dr. [name].   # COPD: On home inhalers.      For questions or updates, please contact [place] Please consult [email or web address] for contact info under        Signed, Skeet Latch, MD  03/22/2020, 9:33 AM

## 2020-03-22 NOTE — Progress Notes (Signed)
Set up patient for a bath at the sink and provided extra extra 1L 02 for activity sats 98% on 4 Ls

## 2020-03-23 ENCOUNTER — Inpatient Hospital Stay: Payer: Medicare HMO

## 2020-03-23 ENCOUNTER — Inpatient Hospital Stay: Payer: Medicare HMO | Admitting: Physician Assistant

## 2020-03-23 DIAGNOSIS — I5043 Acute on chronic combined systolic (congestive) and diastolic (congestive) heart failure: Secondary | ICD-10-CM | POA: Diagnosis not present

## 2020-03-23 LAB — CULTURE, BLOOD (ROUTINE X 2)
Culture: NO GROWTH
Culture: NO GROWTH
Special Requests: ADEQUATE
Special Requests: ADEQUATE

## 2020-03-23 LAB — BASIC METABOLIC PANEL
Anion gap: 12 (ref 5–15)
BUN: 21 mg/dL — ABNORMAL HIGH (ref 6–20)
CO2: 31 mmol/L (ref 22–32)
Calcium: 9.2 mg/dL (ref 8.9–10.3)
Chloride: 95 mmol/L — ABNORMAL LOW (ref 98–111)
Creatinine, Ser: 1.06 mg/dL (ref 0.61–1.24)
GFR, Estimated: >60 mL/min (ref >60)
Glucose, Bld: 105 mg/dL — ABNORMAL HIGH (ref 70–99)
Potassium: 4.5 mmol/L (ref 3.5–5.1)
Sodium: 138 mmol/L (ref 135–145)

## 2020-03-23 MED ORDER — SACUBITRIL-VALSARTAN 24-26 MG PO TABS
1.0000 | ORAL_TABLET | Freq: Two times a day (BID) | ORAL | Status: DC
  Administered 2020-03-25: 1 via ORAL
  Filled 2020-03-23 (×2): qty 1

## 2020-03-23 NOTE — Progress Notes (Signed)
Progress Note  Patient Name: [name] Date of Encounter: 03/23/2020  [facility] Cardiologist: Kirk Ruths, MD   Subjective   No chest pain no SOB, + diaphoretic.  Inpatient Medications    Scheduled Meds:  aspirin EC  81 mg Oral Daily   atorvastatin  40 mg Oral Daily   buPROPion  150 mg Oral Daily   diclofenac Sodium  4 g Topical Daily   enoxaparin (LOVENOX) injection  40 mg Subcutaneous C16S   folic acid  1 mg Oral Daily   lidocaine  1 patch Transdermal Q24H   metoprolol succinate  12.5 mg Oral Daily   mometasone-formoterol  2 puff Inhalation BID   sacubitril-valsartan  1 tablet Oral BID   sodium chloride flush  3 mL Intravenous Q12H   umeclidinium bromide  1 puff Inhalation Daily   Continuous Infusions:  sodium chloride     PRN Meds: sodium chloride, acetaminophen, fentaNYL (SUBLIMAZE) injection, ipratropium, ondansetron (ZOFRAN) IV, prochlorperazine, sodium chloride flush   Vital Signs    Vitals:   03/23/20 0752 03/23/20 0754 03/23/20 0800 03/23/20 0809  BP:   97/68   Pulse:   94   Resp:   20   Temp:   98.6 F (37 C)   TempSrc:   Oral   SpO2: 97% 97% 99% 99%  Weight:      Height:        Intake/Output Summary (Last 24 hours) at 03/23/2020 0916 Last data filed at 03/23/2020 0300 Gross per 24 hour  Intake 1223.42 ml  Output 650 ml  Net 573.42 ml   Last 3 Weights 03/23/2020 03/22/2020 03/21/2020  Weight (lbs) 119 lb 4.8 oz 119 lb 11.2 oz 121 lb  Weight (kg) 54.114 kg 54.296 kg 54.885 kg      Telemetry    SR occ couplet PVC - Personally Reviewed  ECG    No new - Personally Reviewed  Physical Exam   GEN: No acute distress.   Neck: No JVD Cardiac: RRR, no murmurs, rubs, or gallops.  Respiratory: Clear to auscultation bilaterally. GI: Soft, nontender, non-distended  MS: No edema; No deformity. Neuro:  Nonfocal  Psych: Normal affect   Labs    High Sensitivity Troponin:   Recent Labs  Lab 03/18/20 0904  03/18/20 1131  TROPONINIHS 51* 63*      Chemistry Recent Labs  Lab 03/16/20 1141 03/16/20 1141 03/18/20 0904 03/18/20 1131 03/20/20 0212 03/21/20 0328 03/22/20 0425  NA 142   < > 139   < > 137 136 134*  K 3.8   < > 4.9   < > 3.9 3.8 4.5  CL 105   < > 105   < > 95* 89* 93*  CO2 31   < > 25   < > 34* 38* 25  GLUCOSE 95   < > 113*   < > 147* 97 94  BUN 11   < > 17   < > 16 15 20   CREATININE 0.84  --  1.10   < > 1.00 0.90 0.89  CALCIUM 8.9   < > 8.7*   < > 9.0 9.5 9.0  PROT 6.9  --  7.0  --   --   --   --   ALBUMIN 2.3*  --  2.5*  --   --   --   --   AST 12*  --  17  --   --   --   --   ALT 12  --  18  --   --   --   --   ALKPHOS 62  --  61  --   --   --   --   BILITOT 0.3  --  0.5  --   --   --   --   GFRNONAA >60  --  >60   < > >60 >60 >60  ANIONGAP 6   < > 9   < > 8 9 16*   < > = values in this interval not displayed.     Hematology Recent Labs  Lab 03/16/20 1141 03/18/20 0904 03/18/20 1131  WBC 10.1 10.0 9.3  RBC 2.68* 3.63* 3.75*  HGB 7.7* 10.2* 10.7*  HCT 24.4* 34.2* 35.2*  MCV 91.0 94.2 93.9  MCH 28.7 28.1 28.5  MCHC 31.6 29.8* 30.4  RDW 20.0* 19.4* 19.4*  PLT 215 215 197    BNP Recent Labs  Lab 03/18/20 0904  BNP 2,750.7*     DDimer No results for input(s): DDIMER in the last 168 hours.   Radiology    No results found.  Cardiac Studies   ECHO 03/18/2020:  1. Left ventricular ejection fraction, by estimation, is <20%. The left  ventricle has severely decreased function. The left ventricle demonstrates  regional wall motion abnormalities (see scoring diagram/findings for  description). The left ventricular  internal cavity size was moderately to severely dilated. There is mild  concentric left ventricular hypertrophy. Left ventricular diastolic  function could not be evaluated. There is diffuse severe hypokinesis with  akinesis of the distal to apical segments  of all walls.  2. Right ventricular systolic function was not well  visualized. The right  ventricular size is not well visualized.  3. The mitral valve is normal in structure. Mild to moderate mitral valve  regurgitation.  4. The aortic valve is grossly normal. Aortic valve regurgitation is  mild. No aortic stenosis is present.   Comparison(s): Prior images unable to be directly viewed, comparison made  by report only.   Conclusion(s)/Recommendation(s): Severely reduced LVEF with both global  and focal wall motion abnormalities.   Patient Profile     59 y.o. male with CAD status post CABG, chronic systolic congestive heart failure secondary to ischemic cardiomyopathy, status post ICD, COPD, hypertension, hyperlipidemia, right lung adenocarcinoma, prostate adenocarcinoma, admitted with COPD exacerbation and acute on chronic systolic diastolic heart failure.  Assessment & Plan    # Acute on chronic systolic and diastolic heart failure/Cardiomyopathy since 2011 and has ICD:  LVEF <20%.  He required BiPAP and IV lasix.  Baseline weight 50-53kg. His weight is down to 54.1 kg. He was net +813 mL yesterday, down 9.1L since admission.  Volume status is stable and he is now off lasix.  Was not on lasix as outpt    Renal function is stable.  Clinically he seems to be doing much better today and is nearly euvolemic.   Continue Entresto.    Will switch carvedilol to metoprolol succinate  12.5 mg daily, rec'd yesterday--has been hypotensive as outpt.   and now BP 89/65 he is diaphoretic but warm.  entresto held today along with lasix and BB  Continue 2g sodium restriction.  He struggles with compliance with this diet.  He noted to nutrition that he has been eating fast food and convenience stores.  Appreciate nutrition seeing him.  He develops hyperkalemia with RAAS inhibition.  Consider adding Verquvo as an outpatient for acute HF event in patient with reduced systolic function. (  was recently in [facility] hospital for PNA and discharged without lasix)  # CAD s/p  CABG (1997) hx STEMI 2010, 2011, PCI SVG to LAD:  # Hyperlipidemia:  Not an active issue.  Continue aspirin, atorvastatin, and switching to metoprolol succinate as above.  # Adenocarcinoma of prostate and R lung and recent anemia:  Chemotherapy with Dr. [name]. Chronic home 02  # COPD/hypoxia: On home inhalers.      For questions or updates, please contact [place] Please consult [email or web address] for contact info under        Signed, Cecilie Kicks, NP  03/23/2020, 9:16 AM

## 2020-03-23 NOTE — Progress Notes (Signed)
[facility] PROGRESS NOTE    Progress Note  [name]  QIH:[id number] DOB: 11/10/1960 DOA: 03/18/2020 PCP: [name], MD     Brief Narrative:   [name] is an 59 y.o. male past medical history significant for coronary artery disease status post CABG, ischemic cardiomyopathy with an EF of 20% status post AICD placement, chronic hypoxic respiratory failure secondary to COPD on 4 L of oxygen, stage Ia non-small cell cancer diagnosed in February 2015 status post right lower lobe wedge resection as well as SBRT with a recurrent disease to the right lower lobe in June 2019 which has been in observation since then. Stage T1 adenocarcinoma of the prostate with a Gleason score of 4 diagnosed in November 2019 with a confirmed PET scan and biopsy of the right lower mass consistent with adenocarcinoma currently on chemotherapy, chemotherapy-induced anemia status post transfusion presents to the emergency room with shortness of breath for 2 weeks associated with lower extremity edema, orthopnea PND and a 20 pound weight gain, BNP of 2700 bilateral pleural effusions with pulmonary edema.  Assessment/Plan:   Acute on chronic combined systolic and diastolic CHF (congestive heart failure) ([place]): Usually weighs like around 50 to 53 kg, this morning is 54 kg. His diuretic therapy was held overnight as he became slightly hypotensive had to be put in Trendelenburg and given 250 cc bolus. Blood pressure this morning is stable.  I am sure his creatinine is can arise due to the episode of hypotension. Hold Coreg, Entresto and diuretic therapy for today.  Basic metabolic panel is pending this morning. Social worker to meet with him to try to change his Medicaid to [place] as he lives in [place].  Coronary artery disease: Currently chest pain-free. Continue aspirin and statins.  Essential hypertension: Continue Entresto his diuretic therapy was held as he became hypotensive  overnight.  Hyperlipidemia: Continue statins.  Prostate adenocarcinoma of the right lung: Follow-up with Dr. [name] for chemotherapy as an outpatient.  COPD (chronic obstructive pulmonary disease) ([place]): Can continue inhalers currently no wheezing.  Depression/anxiety: Continue Wellbutrin.    DVT prophylaxis: lovenOX Family Communication:none Status is: Inpatient  Remains inpatient appropriate because:Hemodynamically unstable   Dispo: The patient is from: Home              Anticipated d/c is to: Home              Anticipated d/c date is: 2 days              Patient currently is not medically stable to d/c.  Once his blood pressure is improved and his renal function is returning to baseline      Code Status:     Code Status Orders  (From admission, onward)         Start     Ordered   03/18/20 1126  Full code  Continuous        03/18/20 1126        Code Status History    Date Active Date Inactive Code Status Order ID Comments User Context   12/12/2018 2034 12/14/2018 1518 Full Code [id number]  [name], MD Inpatient   10/24/2017 0140 10/26/2017 1934 Full Code [id number]  [name], MD ED   03/23/2017 0429 03/26/2017 1710 Full Code [id number]  [name], MD ED   06/15/2015 0357 06/17/2015 1854 Full Code [id number]  [name], MD ED   08/07/2013 1900 08/16/2013 1737 Full Code [id number]  [name], PA-C  Inpatient   07/18/2013 1253 07/19/2013 0343 Full Code [id number]  [name], MD [facility]   Advance Care Planning Activity        IV Access:    Peripheral IV   Procedures and diagnostic studies:   No results found.   Medical Consultants:    None.  Anti-Infectives:   none  Subjective:    [name] relates he feels better this morning.  Objective:    Vitals:   03/23/20 0752 03/23/20 0754 03/23/20 0800 03/23/20 0809  BP:   97/68   Pulse:   94   Resp:   20   Temp:   98.6 F (37 C)   TempSrc:   Oral    SpO2: 97% 97% 99% 99%  Weight:      Height:       SpO2: 99 % O2 Flow Rate (L/min): 3 L/min   Intake/Output Summary (Last 24 hours) at 03/23/2020 0932 Last data filed at 03/23/2020 0300 Gross per 24 hour  Intake 1223.42 ml  Output 650 ml  Net 573.42 ml   Filed Weights   03/21/20 0404 03/22/20 0303 03/23/20 0500  Weight: 54.9 kg 54.3 kg 54.1 kg    Exam: General exam: In no acute distress. Respiratory system: Good air movement and clear to auscultation Cardiovascular system: S1 & S2 heard, RRR.  No JVD Gastrointestinal system: Abdomen is nondistended, soft and nontender.  Extremities: No pedal edema. Skin: No rashes, lesions or ulcers  Data Reviewed:    Labs: Basic Metabolic Panel: Recent Labs  Lab 03/18/20 0904 03/18/20 0904 03/18/20 1131 03/19/20 0240 03/19/20 0240 03/20/20 0212 03/20/20 0212 03/21/20 0328 03/22/20 0425  NA 139  --   --  139  --  137  --  136 134*  K 4.9   < >  --  4.3   < > 3.9   < > 3.8 4.5  CL 105  --   --  99  --  95*  --  89* 93*  CO2 25  --   --  34*  --  34*  --  38* 25  GLUCOSE 113*  --   --  132*  --  147*  --  97 94  BUN 17  --   --  16  --  16  --  15 20  CREATININE 1.10   < > 1.05 1.21  --  1.00  --  0.90 0.89  CALCIUM 8.7*  --   --  8.5*  --  9.0  --  9.5 9.0  MG  --   --  1.2*  --   --   --   --   --   --    < > = values in this interval not displayed.   GFR Estimated Creatinine Clearance: 70.1 mL/min (by C-G formula based on SCr of 0.89 mg/dL). Liver Function Tests: Recent Labs  Lab 03/16/20 1141 03/18/20 0904  AST 12* 17  ALT 12 18  ALKPHOS 62 61  BILITOT 0.3 0.5  PROT 6.9 7.0  ALBUMIN 2.3* 2.5*   No results for input(s): LIPASE, AMYLASE in the last 168 hours. No results for input(s): AMMONIA in the last 168 hours. Coagulation profile No results for input(s): INR, PROTIME in the last 168 hours. COVID-19 Labs  No results for input(s): DDIMER, FERRITIN, LDH, CRP in the last 72 hours.  Lab Results  Component  Value Date   [place] NEGATIVE 03/18/2020   SARSCOV2NAA NOT DETECTED 03/01/2019  [place] NEGATIVE 12/12/2018   SARSCOV2NAA NOT DETECTED 10/19/2018    CBC: Recent Labs  Lab 03/16/20 1141 03/18/20 0904 03/18/20 1131  WBC 10.1 10.0 9.3  NEUTROABS 7.7 7.4  --   HGB 7.7* 10.2* 10.7*  HCT 24.4* 34.2* 35.2*  MCV 91.0 94.2 93.9  PLT 215 215 197   Cardiac Enzymes: No results for input(s): CKTOTAL, CKMB, CKMBINDEX, TROPONINI in the last 168 hours. BNP (last 3 results) No results for input(s): PROBNP in the last 8760 hours. CBG: Recent Labs  Lab 03/22/20 1556  GLUCAP 79   D-Dimer: No results for input(s): DDIMER in the last 72 hours. Hgb A1c: No results for input(s): HGBA1C in the last 72 hours. Lipid Profile: No results for input(s): CHOL, HDL, LDLCALC, TRIG, CHOLHDL, LDLDIRECT in the last 72 hours. Thyroid function studies: No results for input(s): TSH, T4TOTAL, T3FREE, THYROIDAB in the last 72 hours.  Invalid input(s): FREET3 Anemia work up: No results for input(s): VITAMINB12, FOLATE, FERRITIN, TIBC, IRON, RETICCTPCT in the last 72 hours. Sepsis Labs: Recent Labs  Lab 03/16/20 1141 03/18/20 0904 03/18/20 1005 03/18/20 1131  WBC 10.1 10.0  --  9.3  LATICACIDVEN  --   --  0.6 0.6   Microbiology Recent Results (from the past 240 hour(s))  Respiratory Panel by RT PCR (Flu A&B, Covid) - Nasopharyngeal Swab     Status: None   Collection Time: 03/18/20  9:21 AM   Specimen: Nasopharyngeal Swab  Result Value Ref Range Status   SARS Coronavirus 2 by RT PCR NEGATIVE NEGATIVE Final    Comment: (NOTE) SARS-CoV-2 target nucleic acids are NOT DETECTED.  The SARS-CoV-2 RNA is generally detectable in upper respiratoy specimens during the acute phase of infection. The lowest concentration of SARS-CoV-2 viral copies this assay can detect is 131 copies/mL. A negative result does not preclude SARS-Cov-2 infection and should not be used as the sole basis for treatment  or other patient management decisions. A negative result may occur with  improper specimen collection/handling, submission of specimen other than nasopharyngeal swab, presence of viral mutation(s) within the areas targeted by this assay, and inadequate number of viral copies (<131 copies/mL). A negative result must be combined with clinical observations, patient history, and epidemiological information. The expected result is Negative.  Fact Sheet for Patients:  PinkCheek.be  Fact Sheet for Healthcare Providers:  GravelBags.it  This test is no t yet approved or cleared by the [place] FDA and  has been authorized for detection and/or diagnosis of SARS-CoV-2 by FDA under an Emergency Use Authorization (EUA). This EUA will remain  in effect (meaning this test can be used) for the duration of the COVID-19 declaration under Section 564(b)(1) of the Act, 21 U.S.C. section 360bbb-3(b)(1), unless the authorization is terminated or revoked sooner.     Influenza A by PCR NEGATIVE NEGATIVE Final   Influenza B by PCR NEGATIVE NEGATIVE Final    Comment: (NOTE) The Xpert Xpress SARS-CoV-2/FLU/RSV assay is intended as an aid in  the diagnosis of influenza from Nasopharyngeal swab specimens and  should not be used as a sole basis for treatment. Nasal washings and  aspirates are unacceptable for Xpert Xpress SARS-CoV-2/FLU/RSV  testing.  Fact Sheet for Patients: PinkCheek.be  Fact Sheet for Healthcare Providers: GravelBags.it  This test is not yet approved or cleared by the [place] FDA and  has been authorized for detection and/or diagnosis of SARS-CoV-2 by  FDA under an Emergency Use Authorization (EUA). This EUA will remain  in effect (  meaning this test can be used) for the duration of the  Covid-19 declaration under Section 564(b)(1) of the Act, 21  U.S.C.  section 360bbb-3(b)(1), unless the authorization is  terminated or revoked. Performed at [facility], [place] [address] [place], [place] [address]   Blood culture (routine x 2)     Status: None (Preliminary result)   Collection Time: 03/18/20 10:05 AM   Specimen: BLOOD  Result Value Ref Range Status   Specimen Description BLOOD RIGHT ANTECUBITAL  Final   Special Requests   Final    BOTTLES DRAWN AEROBIC AND ANAEROBIC Blood Culture adequate volume   Culture   Final    NO GROWTH 4 DAYS Performed at [facility], [place] [address], [place], [place] [address]    Report Status PENDING  Incomplete  Blood culture (routine x 2)     Status: None (Preliminary result)   Collection Time: 03/18/20 10:14 AM   Specimen: BLOOD LEFT ARM  Result Value Ref Range Status   Specimen Description BLOOD LEFT ARM  Final   Special Requests   Final    BOTTLES DRAWN AEROBIC AND ANAEROBIC Blood Culture adequate volume   Culture   Final    NO GROWTH 4 DAYS Performed at [facility], [place] [address], [place],  [address]    Report Status PENDING  Incomplete     Medications:   . aspirin EC  81 mg Oral Daily  . atorvastatin  40 mg Oral Daily  . buPROPion  150 mg Oral Daily  . diclofenac Sodium  4 g Topical Daily  . enoxaparin (LOVENOX) injection  40 mg Subcutaneous Q24H  . folic acid  1 mg Oral Daily  . lidocaine  1 patch Transdermal Q24H  . metoprolol succinate  12.5 mg Oral Daily  . mometasone-formoterol  2 puff Inhalation BID  . sacubitril-valsartan  1 tablet Oral BID  . sodium chloride flush  3 mL Intravenous Q12H  . umeclidinium bromide  1 puff Inhalation Daily   Continuous Infusions: . sodium chloride        LOS: 5 days   Charlynne Cousins  [facility]  03/23/2020, 9:32 AM

## 2020-03-23 NOTE — Progress Notes (Signed)
   03/23/20 1920  Assess: MEWS Score  Temp 98.4 F (36.9 C)  BP (!) 90/58  ECG Heart Rate 99  Resp (!) 23  SpO2 98 %  O2 Device Nasal Cannula  O2 Flow Rate (L/min) 3 L/min  Assess: MEWS Score  MEWS Temp 0  MEWS Systolic 1  MEWS Pulse 0  MEWS RR 1  MEWS LOC 0  MEWS Score 2  MEWS Score Color Yellow  Assess: if the MEWS score is Yellow or Red  Were vital signs taken at a resting state? Yes  Focused Assessment No change from prior assessment  Early Detection of Sepsis Score *See Row Information* Low  MEWS guidelines implemented *See Row Information* Yes  Treat  MEWS Interventions Escalated (See documentation below)  Pain Scale 0-10  Pain Score 3  Take Vital Signs  Increase Vital Sign Frequency  Yellow: Q 2hr X 2 then Q 4hr X 2, if remains yellow, continue Q 4hrs  Escalate  MEWS: Escalate Yellow: discuss with charge nurse/RN and consider discussing with provider and RRT  Notify: Charge Nurse/RN  Name of Charge Nurse/RN Notified [place]  Date Charge Nurse/RN Notified 03/23/20  Time Charge Nurse/RN Notified 1955  Document  Patient Outcome Other (Comment) (hypotension ongoing - not an acute change)  Progress note created (see row info) Yes

## 2020-03-23 NOTE — Care Management Important Message (Signed)
Important Message  Patient Details  Name: [name] MRN: [id number] Date of Birth: 1960-07-21   Medicare Important Message Given:  Yes     Shelda Altes 03/23/2020, 9:31 AM

## 2020-03-23 NOTE — Progress Notes (Signed)
This chaplain responded to PMT consult to document the Pt. Mother-Metha as his HCPOA.  The Pt. is sitting up in bed and shares his pain is controlled today.    The Pt. requests time to talk to his mother about becoming the Pt. HCPOA.  The Advance Directive document was left in the Pt. Room.  The Pt. shared an interest in becoming part of a [employer] community. The chaplain will explore with the Pt. churches which may fit geographically.  This chaplain will F/U on HCPOA and [place] communities on Tuesday.

## 2020-03-24 DIAGNOSIS — I255 Ischemic cardiomyopathy: Secondary | ICD-10-CM

## 2020-03-24 DIAGNOSIS — R918 Other nonspecific abnormal finding of lung field: Secondary | ICD-10-CM | POA: Diagnosis not present

## 2020-03-24 DIAGNOSIS — I952 Hypotension due to drugs: Secondary | ICD-10-CM | POA: Diagnosis not present

## 2020-03-24 DIAGNOSIS — I5043 Acute on chronic combined systolic (congestive) and diastolic (congestive) heart failure: Secondary | ICD-10-CM | POA: Diagnosis not present

## 2020-03-24 MED ORDER — METOPROLOL SUCCINATE ER 25 MG PO TB24
25.0000 mg | ORAL_TABLET | Freq: Every day | ORAL | Status: DC
  Administered 2020-03-25: 25 mg via ORAL
  Filled 2020-03-24: qty 1

## 2020-03-24 NOTE — Progress Notes (Signed)
Progress Note  Patient Name: [name] Date of Encounter: 03/24/2020  [facility] Cardiologist: Kirk Ruths, MD   Subjective   No chest pain and no SOB, + shoulder pain has heating pad.  Inpatient Medications    Scheduled Meds: . aspirin EC  81 mg Oral Daily  . atorvastatin  40 mg Oral Daily  . buPROPion  150 mg Oral Daily  . diclofenac Sodium  4 g Topical Daily  . enoxaparin (LOVENOX) injection  40 mg Subcutaneous Q24H  . folic acid  1 mg Oral Daily  . lidocaine  1 patch Transdermal Q24H  . metoprolol succinate  12.5 mg Oral Daily  . mometasone-formoterol  2 puff Inhalation BID  . sacubitril-valsartan  1 tablet Oral BID  . sodium chloride flush  3 mL Intravenous Q12H  . umeclidinium bromide  1 puff Inhalation Daily   Continuous Infusions: . sodium chloride     PRN Meds: sodium chloride, acetaminophen, fentaNYL (SUBLIMAZE) injection, ipratropium, ondansetron (ZOFRAN) IV, prochlorperazine, sodium chloride flush   Vital Signs    Vitals:   03/24/20 0030 03/24/20 0037 03/24/20 0400 03/24/20 0414  BP:  95/62 98/69   Pulse:  (!) 102 93   Resp: (!) 21 (!) 21 (!) 22   Temp:  97.9 F (36.6 C) 98.1 F (36.7 C)   TempSrc:  Oral Oral   SpO2:  100% 98%   Weight:    54.4 kg  Height:        Intake/Output Summary (Last 24 hours) at 03/24/2020 0706 Last data filed at 03/24/2020 0100 Gross per 24 hour  Intake 363 ml  Output --  Net 363 ml   Last 3 Weights 03/24/2020 03/24/2020 03/23/2020  Weight (lbs) 119 lb 14.4 oz 119 lb 14.4 oz 119 lb 4.8 oz  Weight (kg) 54.386 kg 54.386 kg 54.114 kg      Telemetry    SR with occ PVCs and couplets - Personally Reviewed  ECG    No new - Personally Reviewed  Physical Exam   GEN: No acute distress.   Neck: No JVD Cardiac: RRR, no murmurs, rubs, or gallops.  Respiratory: Clear to auscultation bilaterally. GI: Soft, nontender, non-distended  MS: No edema; No deformity. Neuro:  Nonfocal  Psych: Normal affect    Labs    High Sensitivity Troponin:   Recent Labs  Lab 03/18/20 0904 03/18/20 1131  TROPONINIHS 51* 63*      Chemistry Recent Labs  Lab 03/18/20 0904 03/18/20 1131 03/21/20 0328 03/22/20 0425 03/23/20 1046  NA 139   < > 136 134* 138  K 4.9   < > 3.8 4.5 4.5  CL 105   < > 89* 93* 95*  CO2 25   < > 38* 25 31  GLUCOSE 113*   < > 97 94 105*  BUN 17   < > 15 20 21*  CREATININE 1.10   < > 0.90 0.89 1.06  CALCIUM 8.7*   < > 9.5 9.0 9.2  PROT 7.0  --   --   --   --   ALBUMIN 2.5*  --   --   --   --   AST 17  --   --   --   --   ALT 18  --   --   --   --   ALKPHOS 61  --   --   --   --   BILITOT 0.5  --   --   --   --  GFRNONAA >60   < > >60 >60 >60  ANIONGAP 9   < > 9 16* 12   < > = values in this interval not displayed.     Hematology Recent Labs  Lab 03/18/20 0904 03/18/20 1131  WBC 10.0 9.3  RBC 3.63* 3.75*  HGB 10.2* 10.7*  HCT 34.2* 35.2*  MCV 94.2 93.9  MCH 28.1 28.5  MCHC 29.8* 30.4  RDW 19.4* 19.4*  PLT 215 197    BNP Recent Labs  Lab 03/18/20 0904  BNP 2,750.7*     DDimer No results for input(s): DDIMER in the last 168 hours.   Radiology    No results found.  Cardiac Studies   ECHO 03/18/2020:  1. Left ventricular ejection fraction, by estimation, is <20%. The left  ventricle has severely decreased function. The left ventricle demonstrates  regional wall motion abnormalities (see scoring diagram/findings for  description). The left ventricular  internal cavity size was moderately to severely dilated. There is mild  concentric left ventricular hypertrophy. Left ventricular diastolic  function could not be evaluated. There is diffuse severe hypokinesis with  akinesis of the distal to apical segments  of all walls.  2. Right ventricular systolic function was not well visualized. The right  ventricular size is not well visualized.  3. The mitral valve is normal in structure. Mild to moderate mitral valve  regurgitation.  4.  The aortic valve is grossly normal. Aortic valve regurgitation is  mild. No aortic stenosis is present.   Comparison(s): Prior images unable to be directly viewed, comparison made  by report only.   Conclusion(s)/Recommendation(s): Severely reduced LVEF with both global  and focal wall motion abnormalities.   Patient Profile     59 y.o. male with CAD status post CABG, chronic systolic congestive heart failure secondary to ischemic cardiomyopathy, status post ICD, COPD, hypertension, hyperlipidemia, right lung adenocarcinoma, prostate adenocarcinoma, admitted with COPD exacerbation and acute on chronic systolic diastolic heart failure  Assessment & Plan    # Acute on chronic systolic and diastolic heart failure/Cardiomyopathy since 2011 and has ICD: LVEF <20%. He required BiPAP and IV lasix. Baseline weight 50-53kg. His weight is down to 54.4 kg. He was net +813 mL Sunday, down 8.7L since admission. Volume status is stable and he is now off lasix.  Was not on lasix as outpt    Renal function is stable. Clinically he seems to be doing much better today and is nearly euvolemic. Continue Entresto.   Switched carvedilol to metoprolol succinate  12.5 mg daily, rec'd yesterday--has been hypotensive as outpt.  BP yesterday with 89/65 he is diaphoretic but warm.  entresto held today along with lasix and BB   --BP down to 73 systolic at 3212  Hold metoprolol today as well   Continue 2g sodium restriction. He struggles with compliance with this diet. He noted to nutrition that he has been eating fast food and convenience stores. Appreciate nutrition seeing him. He develops hyperkalemia with RAAS inhibition. Consider adding Verquvo as an outpatient for acute HF event in patient with reduced systolic function. (was recently in [facility] hospital for PNA and discharged without lasix)  # CAD s/p CABG (1997) hx STEMI 2010, 2011, PCI SVG to LAD:  # Hyperlipidemia:  Not an active issue.  Continue aspirin, atorvastatin, and switching to metoprolol succinate as above. When BP allows  # Adenocarcinoma of prostate and R lung and recent anemia:  Chemotherapy with Dr. [name]. Chronic home 02  # COPD/hypoxia: On home  inhalers.      For questions or updates, please contact [place] Please consult [email or web address] for contact info under        Signed, Cecilie Kicks, NP  03/24/2020, 7:06 AM

## 2020-03-24 NOTE — Progress Notes (Signed)
Hold Lasix, Entresto and BB metoprolol per NP [place].

## 2020-03-24 NOTE — Progress Notes (Signed)
   03/24/20 0814  Assess: MEWS Score  Temp 98.8 F (37.1 C)  BP 112/70  Pulse Rate 80  Resp 20  SpO2 100 %  O2 Device Nasal Cannula  O2 Flow Rate (L/min) 5 L/min  Assess: MEWS Score  MEWS Temp 0  MEWS Systolic 0  MEWS Pulse 0  MEWS RR 0  MEWS LOC 0  MEWS Score 0  MEWS Score Color Green  Assess: if the MEWS score is Yellow or Red  Were vital signs taken at a resting state? Yes  Focused Assessment No change from prior assessment  Early Detection of Sepsis Score *See Row Information* Low  MEWS guidelines implemented *See Row Information* No, previously yellow, continue vital signs every 4 hours  Treat  Pain Scale 0-10  Pain Score 9  Pain Type Chronic pain  Pain Location Scapula  Pain Orientation Right  Pain Descriptors / Indicators Aching  Pain Frequency Constant  Pain Onset On-going  Patients Stated Pain Goal 2  Pain Intervention(s) Medication (See eMAR)  Multiple Pain Sites No  Take Vital Signs  Increase Vital Sign Frequency   (NO, back to green)  Escalate  MEWS: Escalate  (N/A)  Notify: Provider  Provider Name/Title  (N/a)  Notify: Rapid Response  Name of Rapid Response RN Notified  (N/a)  Document  Patient Outcome Other (Comment) (VS rechecked, yellow on night shift, now back to green)  Pt with MEWS score of 2 yellow at the beginning of the shift. Chronic state, no change. VS rechecked, back to green, will continue assessment and VS.

## 2020-03-24 NOTE — Progress Notes (Signed)
[facility] PROGRESS NOTE    Progress Note  [name]  KZL:[id number] DOB: [birth year] DOA: 03/18/2020 PCP: [name], MD     Brief Narrative:   [name] is an 59 y.o. male past medical history significant for coronary artery disease status post CABG, ischemic cardiomyopathy with an EF of 20% status post AICD placement, chronic hypoxic respiratory failure secondary to COPD on 4 L of oxygen, stage Ia non-small cell cancer diagnosed in February 2015 status post right lower lobe wedge resection as well as SBRT with a recurrent disease to the right lower lobe in June 2019 which has been in observation since then. Stage T1 adenocarcinoma of the prostate with a Gleason score of 4 diagnosed in November 2019 with a confirmed PET scan and biopsy of the right lower mass consistent with adenocarcinoma currently on chemotherapy, chemotherapy-induced anemia status post transfusion presents to the emergency room with shortness of breath for 2 weeks associated with lower extremity edema, orthopnea PND and a 20 pound weight gain, BNP of 2700 bilateral pleural effusions with pulmonary edema.  Assessment/Plan:   Acute on chronic combined systolic and diastolic CHF (congestive heart failure) ([place]): Also started weight, yesterday he was 54 kg usually ranges 50 to 53 kg. Diuretics were held overnight as he became slightly hypotensive.  Now his blood pressure is improved. He appears euvolemic on physical exam. Increase his Toprol XL today, continue hold diuretic therapy. Blood pressure continues to improve we can restart him on [employer]. Check orthostatics this morning. Social worker to meet with him to try to change his Medicaid to [place] as he lives in [place].  Coronary artery disease: Currently chest pain-free. Continue aspirin and statins.  Essential hypertension: Continue to hold [place] his diuretic therapy today, BP is improving can restart in am if his BP continue to  trend high.  Hyperlipidemia: Continue statins.  Prostate adenocarcinoma of the right lung: Follow-up with Dr. [name] for chemotherapy as an outpatient.  COPD (chronic obstructive pulmonary disease) ([place]): Can continue inhalers currently no wheezing.  Depression/anxiety: Continue Wellbutrin.    DVT prophylaxis: lovenOX Family Communication:none Status is: Inpatient  Remains inpatient appropriate because:Hemodynamically unstable   Dispo: The patient is from: Home              Anticipated d/c is to: Home              Anticipated d/c date is: 2 days              Patient currently is not medically stable to d/c.  Probably home in 48 hours.      Code Status:     Code Status Orders  (From admission, onward)         Start     Ordered   03/18/20 1126  Full code  Continuous        03/18/20 1126        Code Status History    Date Active Date Inactive Code Status Order ID Comments User Context   12/12/2018 2034 12/14/2018 1518 Full Code [id number]  [name], MD Inpatient   10/24/2017 0140 10/26/2017 1934 Full Code [id number]  [name], MD ED   03/23/2017 0429 03/26/2017 1710 Full Code [id number]  [name], MD ED   06/15/2015 0357 06/17/2015 1854 Full Code [id number]  [name], MD ED   08/07/2013 1900 08/16/2013 1737 Full Code [id number]  [name], PA-C Inpatient   07/18/2013 1253 07/19/2013 0343 Full Code [id number]  [name], MD [facility]   Advance Care Planning Activity        IV Access:    Peripheral IV   Procedures and diagnostic studies:   No results found.   Medical Consultants:    None.  Anti-Infectives:   none  Subjective:    [name] relate he wants to get out of here.  Objective:    Vitals:   03/24/20 0414 03/24/20 0751 03/24/20 0752 03/24/20 0814  BP:    112/70  Pulse:    80  Resp:    20  Temp:    98.8 F (37.1 C)  TempSrc:    Oral  SpO2:  98% 98% 100%  Weight: 54.4 kg     Height:        SpO2: 100 % O2 Flow Rate (L/min): 5 L/min   Intake/Output Summary (Last 24 hours) at 03/24/2020 1024 Last data filed at 03/24/2020 0948 Gross per 24 hour  Intake 1203 ml  Output --  Net 1203 ml   Filed Weights   03/23/20 0500 03/24/20 0028 03/24/20 0414  Weight: 54.1 kg 54.4 kg 54.4 kg    Exam: General exam: In no acute distress. Respiratory system: Good air movement and clear to auscultation. Cardiovascular system: S1 & S2 heard, RRR. No JVD. Gastrointestinal system: Abdomen is nondistended, soft and nontender.  Extremities: No pedal edema. Skin: No rashes, lesions or ulcers  Data Reviewed:    Labs: Basic Metabolic Panel: Recent Labs  Lab 03/18/20 0904 03/18/20 1131 03/19/20 0240 03/19/20 0240 03/20/20 0212 03/20/20 0212 03/21/20 0328 03/21/20 0328 03/22/20 0425 03/23/20 1046  NA   < >  --  139  --  137  --  136  --  134* 138  K   < >  --  4.3   < > 3.9   < > 3.8   < > 4.5 4.5  CL   < >  --  99  --  95*  --  89*  --  93* 95*  CO2   < >  --  34*  --  34*  --  38*  --  25 31  GLUCOSE   < >  --  132*  --  147*  --  97  --  94 105*  BUN   < >  --  16  --  16  --  15  --  20 21*  CREATININE   < > 1.05 1.21  --  1.00  --  0.90  --  0.89 1.06  CALCIUM   < >  --  8.5*  --  9.0  --  9.5  --  9.0 9.2  MG  --  1.2*  --   --   --   --   --   --   --   --    < > = values in this interval not displayed.   GFR Estimated Creatinine Clearance: 59.2 mL/min (by C-G formula based on SCr of 1.06 mg/dL). Liver Function Tests: Recent Labs  Lab 03/18/20 0904  AST 17  ALT 18  ALKPHOS 61  BILITOT 0.5  PROT 7.0  ALBUMIN 2.5*   No results for input(s): LIPASE, AMYLASE in the last 168 hours. No results for input(s): AMMONIA in the last 168 hours. Coagulation profile No results for input(s): INR, PROTIME in the last 168 hours. COVID-19 Labs  No results for input(s): DDIMER, FERRITIN, LDH, CRP in the last 72 hours.  Lab Results  Component Value Date   SARSCOV2NAA  NEGATIVE 03/18/2020   SARSCOV2NAA NOT DETECTED 03/01/2019   [place] NEGATIVE 12/12/2018   SARSCOV2NAA NOT DETECTED 10/19/2018    CBC: Recent Labs  Lab 03/18/20 0904 03/18/20 1131  WBC 10.0 9.3  NEUTROABS 7.4  --   HGB 10.2* 10.7*  HCT 34.2* 35.2*  MCV 94.2 93.9  PLT 215 197   Cardiac Enzymes: No results for input(s): CKTOTAL, CKMB, CKMBINDEX, TROPONINI in the last 168 hours. BNP (last 3 results) No results for input(s): PROBNP in the last 8760 hours. CBG: Recent Labs  Lab 03/22/20 1556  GLUCAP 79   D-Dimer: No results for input(s): DDIMER in the last 72 hours. Hgb A1c: No results for input(s): HGBA1C in the last 72 hours. Lipid Profile: No results for input(s): CHOL, HDL, LDLCALC, TRIG, CHOLHDL, LDLDIRECT in the last 72 hours. Thyroid function studies: No results for input(s): TSH, T4TOTAL, T3FREE, THYROIDAB in the last 72 hours.  Invalid input(s): FREET3 Anemia work up: No results for input(s): VITAMINB12, FOLATE, FERRITIN, TIBC, IRON, RETICCTPCT in the last 72 hours. Sepsis Labs: Recent Labs  Lab 03/18/20 0904 03/18/20 1005 03/18/20 1131  WBC 10.0  --  9.3  LATICACIDVEN  --  0.6 0.6   Microbiology Recent Results (from the past 240 hour(s))  Respiratory Panel by RT PCR (Flu A&B, Covid) - Nasopharyngeal Swab     Status: None   Collection Time: 03/18/20  9:21 AM   Specimen: Nasopharyngeal Swab  Result Value Ref Range Status   SARS Coronavirus 2 by RT PCR NEGATIVE NEGATIVE Final    Comment: (NOTE) SARS-CoV-2 target nucleic acids are NOT DETECTED.  The SARS-CoV-2 RNA is generally detectable in upper respiratoy specimens during the acute phase of infection. The lowest concentration of SARS-CoV-2 viral copies this assay can detect is 131 copies/mL. A negative result does not preclude SARS-Cov-2 infection and should not be used as the sole basis for treatment or other patient management decisions. A negative result may occur with  improper specimen  collection/handling, submission of specimen other than nasopharyngeal swab, presence of viral mutation(s) within the areas targeted by this assay, and inadequate number of viral copies (<131 copies/mL). A negative result must be combined with clinical observations, patient history, and epidemiological information. The expected result is Negative.  Fact Sheet for Patients:  PinkCheek.be  Fact Sheet for Healthcare Providers:  GravelBags.it  This test is no t yet approved or cleared by the [place] FDA and  has been authorized for detection and/or diagnosis of SARS-CoV-2 by FDA under an Emergency Use Authorization (EUA). This EUA will remain  in effect (meaning this test can be used) for the duration of the COVID-19 declaration under Section 564(b)(1) of the Act, 21 U.S.C. section 360bbb-3(b)(1), unless the authorization is terminated or revoked sooner.     Influenza A by PCR NEGATIVE NEGATIVE Final   Influenza B by PCR NEGATIVE NEGATIVE Final    Comment: (NOTE) The Xpert Xpress SARS-CoV-2/FLU/RSV assay is intended as an aid in  the diagnosis of influenza from Nasopharyngeal swab specimens and  should not be used as a sole basis for treatment. Nasal washings and  aspirates are unacceptable for Xpert Xpress SARS-CoV-2/FLU/RSV  testing.  Fact Sheet for Patients: PinkCheek.be  Fact Sheet for Healthcare Providers: GravelBags.it  This test is not yet approved or cleared by the [place] FDA and  has been authorized for detection and/or diagnosis of SARS-CoV-2 by  FDA under an Emergency Use Authorization (EUA). This EUA will remain  in effect (meaning this test can be used) for the duration of the  Covid-19 declaration under Section 564(b)(1) of the Act, 21  U.S.C. section 360bbb-3(b)(1), unless the authorization is  terminated or revoked. Performed at [facility], [place] [address] [place], [place] [address]   Blood culture (routine x 2)     Status: None   Collection Time: 03/18/20 10:05 AM   Specimen: BLOOD  Result Value Ref Range Status   Specimen Description BLOOD RIGHT ANTECUBITAL  Final   Special Requests   Final    BOTTLES DRAWN AEROBIC AND ANAEROBIC Blood Culture adequate volume   Culture   Final    NO GROWTH 5 DAYS Performed at [facility], [place] [address], [place], [place] [address]    Report Status 03/23/2020 FINAL  Final  Blood culture (routine x 2)     Status: None   Collection Time: 03/18/20 10:14 AM   Specimen: BLOOD LEFT ARM  Result Value Ref Range Status   Specimen Description BLOOD LEFT ARM  Final   Special Requests   Final    BOTTLES DRAWN AEROBIC AND ANAEROBIC Blood Culture adequate volume   Culture   Final    NO GROWTH 5 DAYS Performed at [facility], [place] [address], [place], [place] [address]    Report Status 03/23/2020 FINAL  Final     Medications:   . aspirin EC  81 mg Oral Daily  . atorvastatin  40 mg Oral Daily  . buPROPion  150 mg Oral Daily  . diclofenac Sodium  4 g Topical Daily  . enoxaparin (LOVENOX) injection  40 mg Subcutaneous Q24H  . folic acid  1 mg Oral Daily  . lidocaine  1 patch Transdermal Q24H  . metoprolol succinate  12.5 mg Oral Daily  . mometasone-formoterol  2 puff Inhalation BID  . sacubitril-valsartan  1 tablet Oral BID  . sodium chloride flush  3 mL Intravenous Q12H  . umeclidinium bromide  1 puff Inhalation Daily   Continuous Infusions: . sodium chloride        LOS: 6 days   Charlynne Cousins  [facility]  03/24/2020, 10:24 AM

## 2020-03-24 NOTE — Progress Notes (Signed)
This chaplain informed the Pt.  [name] with [facility] will visit with Pt. on Wednesday to assess Pt.'s need for assistance in managing health care.

## 2020-03-24 NOTE — Progress Notes (Signed)
This chaplain is present for PMT  F/U spiritual care with the Pt. The Pt. is sitting up in bed and shares his pain is in control.  The Pt. has not talked to his mother about serving as the Pt. HCPOA.  The chaplain listened as the Pt. shared the disparities in healthcare as a resident of [place]. The chaplain understands the Pt. choice of housing in [place] is connected to affordability. The Pt. prefers the healthcare options provided in [place] and the companionship of family in [place] and [employer]. The Pt communicated his commitment to a relationship with his medical team but is challenged by transportation to the appointments. The Pt. stated, "I  feel stressed all the time."  The chaplain will F/U with PMT and case manager to determine the appropriate next steps in Pt. spiritual care.

## 2020-03-25 ENCOUNTER — Telehealth: Payer: Self-pay | Admitting: Medical Oncology

## 2020-03-25 DIAGNOSIS — I255 Ischemic cardiomyopathy: Secondary | ICD-10-CM | POA: Diagnosis not present

## 2020-03-25 DIAGNOSIS — I5043 Acute on chronic combined systolic (congestive) and diastolic (congestive) heart failure: Secondary | ICD-10-CM | POA: Diagnosis not present

## 2020-03-25 DIAGNOSIS — M545 Low back pain, unspecified: Secondary | ICD-10-CM | POA: Diagnosis not present

## 2020-03-25 DIAGNOSIS — J9621 Acute and chronic respiratory failure with hypoxia: Secondary | ICD-10-CM | POA: Diagnosis not present

## 2020-03-25 DIAGNOSIS — R918 Other nonspecific abnormal finding of lung field: Secondary | ICD-10-CM | POA: Diagnosis not present

## 2020-03-25 LAB — BASIC METABOLIC PANEL
Anion gap: 11 (ref 5–15)
BUN: 18 mg/dL (ref 6–20)
CO2: 28 mmol/L (ref 22–32)
Calcium: 9.3 mg/dL (ref 8.9–10.3)
Chloride: 98 mmol/L (ref 98–111)
Creatinine, Ser: 0.89 mg/dL (ref 0.61–1.24)
GFR, Estimated: >60 mL/min (ref >60)
Glucose, Bld: 105 mg/dL — ABNORMAL HIGH (ref 70–99)
Potassium: 4.8 mmol/L (ref 3.5–5.1)
Sodium: 137 mmol/L (ref 135–145)

## 2020-03-25 MED ORDER — METOPROLOL SUCCINATE ER 25 MG PO TB24
12.5000 mg | ORAL_TABLET | Freq: Every day | ORAL | Status: DC
  Administered 2020-03-26 – 2020-04-09 (×15): 12.5 mg via ORAL
  Filled 2020-03-25 (×16): qty 1

## 2020-03-25 NOTE — Progress Notes (Signed)
Outpatient CSW consulted by palliative team as patient is seen at [facility] office and having some SDOH concerns especially with transportation.  CSW met with pt at bedside to assess.  Pt reports that only concern at this time is transportation.  He moved to [place] around a year ago but still has his [place] providers and relies on friends to help him get to appts but they all work and have their own commitments making it hard to nail down transportation and making him feel like a burden.  States he spoke with cancer center about this in the past but they were unable to assist due to distance.  CSW emailed [place] [facility]W to confirm this as they use [facility] which has informed me previously they are able to transport up to 70 miles away- awaiting response.  CSW confirmed with pt we could assist pt in getting to all [facility] appts- next one not till January so no immediate needs.  CSW will follow up with pt after hearing back regarding transport assistance to cancer center appts.  No other concerns expressed with SDOH needs at this time.  Jorge Ny, LCSW Clinical Social Worker [facility] Desk#: [phone] Cell#: [phone]

## 2020-03-25 NOTE — Progress Notes (Signed)
PROGRESS NOTE    [name]  PPJ:[id number] DOB: 1960/10/07 DOA: 03/18/2020 PCP: [name], MD   Brief Narrative:  HPI on 03/18/2020 by Dr. Early Osmond [name] is a 59 y.o. male with medical history significant of coronary artery disease status post CABG, ischemic cardiomyopathy with ejection fraction of 20 to 25% status post ICD placement, chronic hypoxemic respiratory failure secondary to underlying COPD-on 4 L of oxygen via nasal cannulae at home, recurrent right lung cancer-currently getting systemic chemotherapy, chemotherapy-induced anemia status post recent PRBC transfusion, prostate cancer presents to emergency department with shortness of breath since 2 weeks.  Patient tells me that that he has shortness of breath since 2 weeks, which is getting worse, associated with leg swelling, orthopnea, PND and weight gain of 20 pounds in last 2 months.  Denies chest pain, fever, chills, cough, congestion, nausea, vomiting, abdominal pain, urinary or bowel changes.  He is compliant with his home medication however he eats high sodium diet.  He cooks for himself and eats bacon/red meat. "I like salt in my food".  He is on 4 L of oxygen via nasal cannulae at home.  Denies current cigarette smoking, alcohol, illicit drug use.  Has history of non-small cell lung cancer diagnosed in February 2015,  Recurrent lung cancer diagnosed in September 2020 currently getting chemotherapy every 3 weeks.  Followed by oncology Dr. [name].  Prostate adenocarcinoma diagnosed in November 2019-followed by Dr. [name]. He was transfused PRBC yesterday due to chemotherapy-induced anemia.  He is fully vaccinated against COVID-19.  He tells me that he admitted at [facility] for pneumonia and received antibiotics.  Interim history Admitted with respiratory failure CHF exacerbation.  Cardiology consulted and following. Assessment & Plan   Acute on chronic hypoxic respiratory failure  secondary to acute on chronic combined systolic and CHF exacerbation/ischemic cardiomyopathy -Patient required BiPAP on admission as he presented with symptoms and signs of fluid overload including shortness of breath, leg swelling, orthopnea and weight gain -Currently remains on 5 L of supplemental oxygen -BNP 275 0.7; high-sensitivity troponin 51 -Patient was placed on IV Lasix however this was held due to hypotension -Echocardiogram on 03/18/2020 showed an EF of less than 20%, LV severely decreased function with regional wall motion abnormalities.  Diffuse severe hypokinesis with akinesis of the distal to apical segments. -Patient does have ICD in place -Cardiology consulted and appreciated -Discussed with Dr. Debara Pickett, cardiology today, will hold Entresto, reduce the dose of metoprolol and possibly give dose of IV Lasix on 03/26/2020 -Monitor intake and output, daily weights  Coronary artery disease -History of CABG -Currently chest pain-free -Continue aspirin, statin   Essential hypertension complicated with hypotension -Blood pressure noted to be 58K systolically today.  However he did receive his Entresto as well as metoprolol this morning -Discussed with cardiology, planning to discontinue Entresto and reduce dose of metoprolol -Continue to monitor closely  Hyperlipidemia -Continue statin  Adenocarcinoma of the right lung -Patient is followed by Dr. [name] and has been receiving systemic chemotherapy every 3 weeks  Prostate adenocarcinoma -Diagnosed in November 2019.  Patient did have he is currently being followed by Dr. [name] seed implants.  COPD -Currently no wheezing, continue home inhalers  Depression/anxiety -Continue Wellbutrin  Chemotherapy-induced anemia -Patient received 2 units PRBC prior to admission  -Today to monitor H&H intermittently  Goals of care -Palliative care consulted and appreciated -Currently patient DNI -Discussed with cardiology, does  not feel that patient is a candidate for advanced heart failure treatments  at this time.  Recommended to discuss with palliative care again. -Reconsulted palliative care for further management  DVT Prophylaxis Lovenox  Code Status: DNI (partial)  Family Communication: None at bedside  Disposition Plan:  Status is: Inpatient  Remains inpatient appropriate because:Hemodynamically unstable   Dispo: The patient is from: Home              Anticipated d/c is to: Home              Anticipated d/c date is: 3 days              Patient currently is not medically stable to d/c.  Consultants Cardiology Palliative care  Procedures  Echocardiogram  Antibiotics   Anti-infectives (From admission, onward)   None      Subjective:   [name] seen and examined today.  Continues to have shortness of breath.  Denies chest pain at this time.  Denies abdominal pain, nausea or vomiting, diarrhea or constipation.  Does not feel that he is able to move very much without having shortness of breath.  Patient became very winded when returning to the bed from bedside commode.  Objective:   Vitals:   03/25/20 0716 03/25/20 0758 03/25/20 0850 03/25/20 1040  BP:  138/62    Pulse:  96    Resp:  20    Temp: 97.6 F (36.4 C)   98.3 F (36.8 C)  TempSrc: Oral   Oral  SpO2:   97%   Weight:      Height:        Intake/Output Summary (Last 24 hours) at 03/25/2020 1447 Last data filed at 03/25/2020 1317 Gross per 24 hour  Intake 1440 ml  Output 725 ml  Net 715 ml   Filed Weights   03/24/20 0028 03/24/20 0414 03/25/20 0034  Weight: 54.4 kg 54.4 kg 54.5 kg    Exam  General: Well developed, well nourished, NAD, appears stated age  76: NCAT,  mucous membranes moist.   Cardiovascular: S1 S2 auscultated, RRR  Respiratory: Diminished breath sounds however clear.  Speaking in short sentences, appears to be visibly short of breath  Abdomen: Soft, nontender, nondistended, + bowel  sounds  Extremities: warm dry without cyanosis clubbing or edema  Neuro: AAOx3, nonfocal  Psych: flat however appropriate   Data Reviewed: I have personally reviewed following labs and imaging studies  CBC: No results for input(s): WBC, NEUTROABS, HGB, HCT, MCV, PLT in the last 168 hours. Basic Metabolic Panel: Recent Labs  Lab 03/20/20 0212 03/21/20 0328 03/22/20 0425 03/23/20 1046 03/25/20 0515  NA 137 136 134* 138 137  K 3.9 3.8 4.5 4.5 4.8  CL 95* 89* 93* 95* 98  CO2 34* 38* 25 31 28   GLUCOSE 147* 97 94 105* 105*  BUN 16 15 20  21* 18  CREATININE 1.00 0.90 0.89 1.06 0.89  CALCIUM 9.0 9.5 9.0 9.2 9.3   GFR: Estimated Creatinine Clearance: 70.6 mL/min (by C-G formula based on SCr of 0.89 mg/dL). Liver Function Tests: No results for input(s): AST, ALT, ALKPHOS, BILITOT, PROT, ALBUMIN in the last 168 hours. No results for input(s): LIPASE, AMYLASE in the last 168 hours. No results for input(s): AMMONIA in the last 168 hours. Coagulation Profile: No results for input(s): INR, PROTIME in the last 168 hours. Cardiac Enzymes: No results for input(s): CKTOTAL, CKMB, CKMBINDEX, TROPONINI in the last 168 hours. BNP (last 3 results) No results for input(s): PROBNP in the last 8760 hours. HbA1C: No results for  input(s): HGBA1C in the last 72 hours. CBG: Recent Labs  Lab 03/22/20 1556  GLUCAP 79   Lipid Profile: No results for input(s): CHOL, HDL, LDLCALC, TRIG, CHOLHDL, LDLDIRECT in the last 72 hours. Thyroid Function Tests: No results for input(s): TSH, T4TOTAL, FREET4, T3FREE, THYROIDAB in the last 72 hours. Anemia Panel: No results for input(s): VITAMINB12, FOLATE, FERRITIN, TIBC, IRON, RETICCTPCT in the last 72 hours. Urine analysis:    Component Value Date/Time   COLORURINE STRAW (A) 12/12/2018 1927   APPEARANCEUR CLEAR 12/12/2018 1927   LABSPEC 1.011 12/12/2018 1927   PHURINE 5.0 12/12/2018 1927   GLUCOSEU NEGATIVE 12/12/2018 1927   HGBUR NEGATIVE  12/12/2018 1927   BILIRUBINUR NEGATIVE 12/12/2018 [place] NEGATIVE 12/12/2018 1927   PROTEINUR NEGATIVE 12/12/2018 1927   UROBILINOGEN 0.2 08/06/2013 1436   NITRITE NEGATIVE 12/12/2018 1927   LEUKOCYTESUR NEGATIVE 12/12/2018 1927   Sepsis Labs: @LABRCNTIP (procalcitonin:4,lacticidven:4)  ) Recent Results (from the past 240 hour(s))  Respiratory Panel by RT PCR (Flu A&B, Covid) - Nasopharyngeal Swab     Status: None   Collection Time: 03/18/20  9:21 AM   Specimen: Nasopharyngeal Swab  Result Value Ref Range Status   SARS Coronavirus 2 by RT PCR NEGATIVE NEGATIVE Final    Comment: (NOTE) SARS-CoV-2 target nucleic acids are NOT DETECTED.  The SARS-CoV-2 RNA is generally detectable in upper respiratoy specimens during the acute phase of infection. The lowest concentration of SARS-CoV-2 viral copies this assay can detect is 131 copies/mL. A negative result does not preclude SARS-Cov-2 infection and should not be used as the sole basis for treatment or other patient management decisions. A negative result may occur with  improper specimen collection/handling, submission of specimen other than nasopharyngeal swab, presence of viral mutation(s) within the areas targeted by this assay, and inadequate number of viral copies (<131 copies/mL). A negative result must be combined with clinical observations, patient history, and epidemiological information. The expected result is Negative.  Fact Sheet for Patients:  PinkCheek.be  Fact Sheet for Healthcare Providers:  GravelBags.it  This test is no t yet approved or cleared by the [place] FDA and  has been authorized for detection and/or diagnosis of SARS-CoV-2 by FDA under an Emergency Use Authorization (EUA). This EUA will remain  in effect (meaning this test can be used) for the duration of the COVID-19 declaration under Section 564(b)(1) of the Act, 21  U.S.C. section 360bbb-3(b)(1), unless the authorization is terminated or revoked sooner.     Influenza A by PCR NEGATIVE NEGATIVE Final   Influenza B by PCR NEGATIVE NEGATIVE Final    Comment: (NOTE) The Xpert Xpress SARS-CoV-2/FLU/RSV assay is intended as an aid in  the diagnosis of influenza from Nasopharyngeal swab specimens and  should not be used as a sole basis for treatment. Nasal washings and  aspirates are unacceptable for Xpert Xpress SARS-CoV-2/FLU/RSV  testing.  Fact Sheet for Patients: PinkCheek.be  Fact Sheet for Healthcare Providers: GravelBags.it  This test is not yet approved or cleared by the [place] FDA and  has been authorized for detection and/or diagnosis of SARS-CoV-2 by  FDA under an Emergency Use Authorization (EUA). This EUA will remain  in effect (meaning this test can be used) for the duration of the  Covid-19 declaration under Section 564(b)(1) of the Act, 21  U.S.C. section 360bbb-3(b)(1), unless the authorization is  terminated or revoked. Performed at [facility], [place] [address] [place], [place] [address]   Blood culture (routine x  2)     Status: None   Collection Time: 03/18/20 10:05 AM   Specimen: BLOOD  Result Value Ref Range Status   Specimen Description BLOOD RIGHT ANTECUBITAL  Final   Special Requests   Final    BOTTLES DRAWN AEROBIC AND ANAEROBIC Blood Culture adequate volume   Culture   Final    NO GROWTH 5 DAYS Performed at [facility], [address], [place], [place] [address]    Report Status 03/23/2020 FINAL  Final  Blood culture (routine x 2)     Status: None   Collection Time: 03/18/20 10:14 AM   Specimen: BLOOD LEFT ARM  Result Value Ref Range Status   Specimen Description BLOOD LEFT ARM  Final   Special Requests   Final    BOTTLES DRAWN AEROBIC AND ANAEROBIC Blood Culture adequate volume   Culture   Final    NO GROWTH 5 DAYS Performed at  [facility], [place] [address], [place], [place] [address]    Report Status 03/23/2020 FINAL  Final      Radiology Studies: No results found.   Scheduled Meds: . aspirin EC  81 mg Oral Daily  . atorvastatin  40 mg Oral Daily  . buPROPion  150 mg Oral Daily  . diclofenac Sodium  4 g Topical Daily  . enoxaparin (LOVENOX) injection  40 mg Subcutaneous Q24H  . folic acid  1 mg Oral Daily  . lidocaine  1 patch Transdermal Q24H  . [START ON 03/26/2020] metoprolol succinate  12.5 mg Oral Daily  . mometasone-formoterol  2 puff Inhalation BID  . sodium chloride flush  3 mL Intravenous Q12H  . umeclidinium bromide  1 puff Inhalation Daily   Continuous Infusions: . sodium chloride       LOS: 7 days   Time Spent in minutes   45 minutes  [name] D.O. on 03/25/2020 at 2:47 PM  Between 7am to 7pm - Please see pager noted on [email or web address]  After 7pm go to [email or web address]  And look for the night coverage person covering for me after hours  [facility] Office  [phone]

## 2020-03-25 NOTE — Plan of Care (Signed)
  Problem: Activity: Goal: Risk for activity intolerance will decrease Outcome: Progressing   Problem: Pain Managment: Goal: General experience of comfort will improve Outcome: Progressing   Problem: Safety: Goal: Ability to remain free from injury will improve Outcome: Progressing   

## 2020-03-25 NOTE — Progress Notes (Signed)
Progress Note  Patient Name: [name] Date of Encounter: 03/25/2020  [facility] Cardiologist: Kirk Ruths, MD   Subjective   Having DOE now- had resolved but returned.  BP 326 systolic now  Inpatient Medications    Scheduled Meds: . aspirin EC  81 mg Oral Daily  . atorvastatin  40 mg Oral Daily  . buPROPion  150 mg Oral Daily  . diclofenac Sodium  4 g Topical Daily  . enoxaparin (LOVENOX) injection  40 mg Subcutaneous Q24H  . folic acid  1 mg Oral Daily  . lidocaine  1 patch Transdermal Q24H  . metoprolol succinate  25 mg Oral Daily  . mometasone-formoterol  2 puff Inhalation BID  . sacubitril-valsartan  1 tablet Oral BID  . sodium chloride flush  3 mL Intravenous Q12H  . umeclidinium bromide  1 puff Inhalation Daily   Continuous Infusions: . sodium chloride     PRN Meds: sodium chloride, acetaminophen, fentaNYL (SUBLIMAZE) injection, ipratropium, ondansetron (ZOFRAN) IV, prochlorperazine, sodium chloride flush   Vital Signs    Vitals:   03/25/20 0300 03/25/20 0500 03/25/20 0716 03/25/20 0758  BP: 102/81 96/67  138/62  Pulse: 99   96  Resp: 18 18    Temp: 98.5 F (36.9 C)  97.6 F (36.4 C)   TempSrc: Oral  Oral   SpO2: 100% 100%    Weight:      Height:        Intake/Output Summary (Last 24 hours) at 03/25/2020 0819 Last data filed at 03/25/2020 7124 Gross per 24 hour  Intake 1920 ml  Output 1175 ml  Net 745 ml   Last 3 Weights 03/25/2020 03/24/2020 03/24/2020  Weight (lbs) 120 lb 3.2 oz 119 lb 14.4 oz 119 lb 14.4 oz  Weight (kg) 54.522 kg 54.386 kg 54.386 kg      Telemetry    SR with PVCs - Personally Reviewed  ECG    No new - Personally Reviewed  Physical Exam   GEN: No acute distress.  Diaphoretic of face, warm  Neck: No JVD Cardiac: RRR, no murmurs, rubs, or gallops.  Respiratory: Clear to auscultation bilaterally. GI: Soft, nontender, non-distended  MS: No edema; No deformity. Neuro:  Nonfocal  Psych: Normal affect    Labs    High Sensitivity Troponin:   Recent Labs  Lab 03/18/20 0904 03/18/20 1131  TROPONINIHS 51* 63*      Chemistry Recent Labs  Lab 03/18/20 0904 03/18/20 1131 03/22/20 0425 03/23/20 1046 03/25/20 0515  NA 139   < > 134* 138 137  K 4.9   < > 4.5 4.5 4.8  CL 105   < > 93* 95* 98  CO2 25   < > 25 31 28   GLUCOSE 113*   < > 94 105* 105*  BUN 17   < > 20 21* 18  CREATININE 1.10   < > 0.89 1.06 0.89  CALCIUM 8.7*   < > 9.0 9.2 9.3  PROT 7.0  --   --   --   --   ALBUMIN 2.5*  --   --   --   --   AST 17  --   --   --   --   ALT 18  --   --   --   --   ALKPHOS 61  --   --   --   --   BILITOT 0.5  --   --   --   --  GFRNONAA >60   < > >60 >60 >60  ANIONGAP 9   < > 16* 12 11   < > = values in this interval not displayed.     Hematology Recent Labs  Lab 03/18/20 0904 03/18/20 1131  WBC 10.0 9.3  RBC 3.63* 3.75*  HGB 10.2* 10.7*  HCT 34.2* 35.2*  MCV 94.2 93.9  MCH 28.1 28.5  MCHC 29.8* 30.4  RDW 19.4* 19.4*  PLT 215 197    BNP Recent Labs  Lab 03/18/20 0904  BNP 2,750.7*     DDimer No results for input(s): DDIMER in the last 168 hours.   Radiology    No results found.  Cardiac Studies    ECHO 03/18/2020:  1. Left ventricular ejection fraction, by estimation, is <20%. The left  ventricle has severely decreased function. The left ventricle demonstrates  regional wall motion abnormalities (see scoring diagram/findings for  description). The left ventricular  internal cavity size was moderately to severely dilated. There is mild  concentric left ventricular hypertrophy. Left ventricular diastolic  function could not be evaluated. There is diffuse severe hypokinesis with  akinesis of the distal to apical segments  of all walls.  2. Right ventricular systolic function was not well visualized. The right  ventricular size is not well visualized.  3. The mitral valve is normal in structure. Mild to moderate mitral valve  regurgitation.   4. The aortic valve is grossly normal. Aortic valve regurgitation is  mild. No aortic stenosis is present.   Comparison(s): Prior images unable to be directly viewed, comparison made  by report only.   Conclusion(s)/Recommendation(s): Severely reduced LVEF with both global  and focal wall motion abnormalities.    Patient Profile     59 y.o. male with CAD status post CABG, chronic systolic congestive heart failure secondary to ischemic cardiomyopathy, status post ICD, COPD, hypertension, hyperlipidemia, right lung adenocarcinoma, prostate adenocarcinoma, admitted with COPD exacerbation and acute on chronic systolic diastolic heart failure  Assessment & Plan   # Acute on chronic systolic and diastolic heart failure/Cardiomyopathy since 2011 and has ICD: LVEF <20%. He required BiPAP and IV lasix. Baseline weight 50-53kg. His weight is down to 54.5kg. He was net +1220 mL yesterday, down 7.6L since admission. Volume status is stable and hypotensive -heis now off lasix. Was not on lasix as outpt due to hypotension  Renal function is stable. Clinically he seems to be doing much better today and is nearly euvolemic. Continue Entresto when able.  --DOE today add one dose of lasix?  Will defer to Dr. Debara Pickett  Switched carvedilol to metoprolol succinate12.5 mg daily, rec'd yesterday--has been hypotensive as outpt.   #Hypotension --BP yesterday with 89/65 he is diaphoretic but warm. entresto held yesterday along with lasix and BB   --BP down to 73 systolic at 0102  Held metoprolol yesterday as well --BP to 87/52 and now up to 138/62 first BP above 725 systolic in 24 hours  Continue 2g sodium restriction. He struggles with compliance with this diet. He noted to nutrition that he has been eating fast food and convenience stores. Appreciate nutrition seeing him. He develops hyperkalemia with RAAS inhibition. Consider adding Verquvo as an outpatient for acute HF event in  patient with reduced systolic function. (was recently in [facility] for PNA and discharged without lasix)  # CAD s/p DGUY(4034) hx STEMI 2010, 2011, PCI SVG to LAD:  # Hyperlipidemia:  Not an active issue. Continue aspirin, atorvastatin, and switched to metoprolol succinate. When  BP allows  # Adenocarcinoma of prostate and R lungand recent anemia:  Chemotherapy with Dr. [name]. Chronic home 02  # COPD/hypoxia: On home inhalers.      For questions or updates, please contact [facility] Please consult [email or web address] for contact info under        Signed, Cecilie Kicks, NP  03/25/2020, 8:19 AM

## 2020-03-25 NOTE — Telephone Encounter (Addendum)
I told pt I am trying to  arrange transportation for all his appts on 11/18 . Transportation message sent.

## 2020-03-25 NOTE — Progress Notes (Signed)
This chaplain was present for spiritual care F/U with Pt. The Pt. is awake and expresses his desire to know what comes next in his medical care.   The Pt. uncertainty of his next steps connect to the Pt. lack of interest in naming a HCPOA. The Pt. prefers to have clear communication with HCPOA.  The Pt. is accepting the joint role of his mother and sister as surrogate decision makers.  The chaplain understands Eliezer Lofts from [facility] has not visited the Pt.  This chaplain is available for F/U spiritual care as needed.

## 2020-03-26 ENCOUNTER — Encounter (HOSPITAL_COMMUNITY): Payer: Self-pay | Admitting: Internal Medicine

## 2020-03-26 ENCOUNTER — Other Ambulatory Visit: Payer: Self-pay | Admitting: Medical Oncology

## 2020-03-26 DIAGNOSIS — I255 Ischemic cardiomyopathy: Secondary | ICD-10-CM | POA: Diagnosis not present

## 2020-03-26 DIAGNOSIS — J9621 Acute and chronic respiratory failure with hypoxia: Secondary | ICD-10-CM | POA: Diagnosis not present

## 2020-03-26 DIAGNOSIS — R918 Other nonspecific abnormal finding of lung field: Secondary | ICD-10-CM | POA: Diagnosis not present

## 2020-03-26 DIAGNOSIS — M545 Low back pain, unspecified: Secondary | ICD-10-CM | POA: Diagnosis not present

## 2020-03-26 DIAGNOSIS — I5043 Acute on chronic combined systolic (congestive) and diastolic (congestive) heart failure: Secondary | ICD-10-CM | POA: Diagnosis not present

## 2020-03-26 LAB — BASIC METABOLIC PANEL
Anion gap: 9 (ref 5–15)
BUN: 20 mg/dL (ref 6–20)
CO2: 29 mmol/L (ref 22–32)
Calcium: 9.1 mg/dL (ref 8.9–10.3)
Chloride: 97 mmol/L — ABNORMAL LOW (ref 98–111)
Creatinine, Ser: 0.99 mg/dL (ref 0.61–1.24)
GFR, Estimated: >60 mL/min (ref >60)
Glucose, Bld: 112 mg/dL — ABNORMAL HIGH (ref 70–99)
Potassium: 4.6 mmol/L (ref 3.5–5.1)
Sodium: 135 mmol/L (ref 135–145)

## 2020-03-26 LAB — HEMOGLOBIN AND HEMATOCRIT, BLOOD
HCT: 30 % — ABNORMAL LOW (ref 39.0–52.0)
Hemoglobin: 9.1 g/dL — ABNORMAL LOW (ref 13.0–17.0)

## 2020-03-26 MED ORDER — FUROSEMIDE 10 MG/ML IJ SOLN
40.0000 mg | Freq: Once | INTRAMUSCULAR | Status: AC
  Administered 2020-03-26: 40 mg via INTRAVENOUS
  Filled 2020-03-26: qty 4

## 2020-03-26 NOTE — Plan of Care (Signed)
  Problem: Activity: Goal: Risk for activity intolerance will decrease Outcome: Adequate for Discharge   Problem: Pain Managment: Goal: General experience of comfort will improve Outcome: Adequate for Discharge   Problem: Safety: Goal: Ability to remain free from injury will improve Outcome: Adequate for Discharge

## 2020-03-26 NOTE — Progress Notes (Signed)
This chaplain present for spiritual care F/U, focusing on continued communication around the Pt. quality of life and medical care.

## 2020-03-26 NOTE — Progress Notes (Signed)
Progress Note  Patient Name: [name] Date of Encounter: 03/26/2020  [facility] Cardiologist: Kirk Ruths, MD   Subjective   BP is still labile, but better today. Now holding Entresto and have reduced BB dose. Restarted lasix - just about net even overnight.  Inpatient Medications    Scheduled Meds: . aspirin EC  81 mg Oral Daily  . atorvastatin  40 mg Oral Daily  . buPROPion  150 mg Oral Daily  . diclofenac Sodium  4 g Topical Daily  . enoxaparin (LOVENOX) injection  40 mg Subcutaneous Q24H  . folic acid  1 mg Oral Daily  . lidocaine  1 patch Transdermal Q24H  . metoprolol succinate  12.5 mg Oral Daily  . mometasone-formoterol  2 puff Inhalation BID  . sodium chloride flush  3 mL Intravenous Q12H  . umeclidinium bromide  1 puff Inhalation Daily   Continuous Infusions: . sodium chloride     PRN Meds: sodium chloride, acetaminophen, fentaNYL (SUBLIMAZE) injection, ipratropium, ondansetron (ZOFRAN) IV, prochlorperazine, sodium chloride flush   Vital Signs    Vitals:   03/26/20 0100 03/26/20 0400 03/26/20 0800 03/26/20 0919  BP: 94/67 92/72 111/72   Pulse:  93    Resp: 20 17 18    Temp:  98.5 F (36.9 C) 97.6 F (36.4 C)   TempSrc:  Oral Oral   SpO2:  100%  96%  Weight:  54.2 kg    Height:        Intake/Output Summary (Last 24 hours) at 03/26/2020 1113 Last data filed at 03/26/2020 0808 Gross per 24 hour  Intake 1558 ml  Output 1000 ml  Net 558 ml   Last 3 Weights 03/26/2020 03/25/2020 03/24/2020  Weight (lbs) 119 lb 7.8 oz 120 lb 3.2 oz 119 lb 14.4 oz  Weight (kg) 54.2 kg 54.522 kg 54.386 kg      Telemetry    SR with PVCs - Personally Reviewed  ECG    N/A  Physical Exam   GEN: No acute distress. Neck: No JVD Cardiac: RRR, no murmurs, rubs, or gallops.  Respiratory: Clear to auscultation bilaterally. GI: Soft, nontender, non-distended  MS: 1+ edema; No deformity. Neuro:  Nonfocal  Psych: Normal affect   Labs    High  Sensitivity Troponin:   Recent Labs  Lab 03/18/20 0904 03/18/20 1131  TROPONINIHS 51* 63*      Chemistry Recent Labs  Lab 03/23/20 1046 03/25/20 0515 03/26/20 0229  NA 138 137 135  K 4.5 4.8 4.6  CL 95* 98 97*  CO2 31 28 29   GLUCOSE 105* 105* 112*  BUN 21* 18 20  CREATININE 1.06 0.89 0.99  CALCIUM 9.2 9.3 9.1  GFRNONAA >60 >60 >60  ANIONGAP 12 11 9      Hematology Recent Labs  Lab 03/26/20 0229  HGB 9.1*  HCT 30.0*    BNP No results for input(s): BNP, PROBNP in the last 168 hours.   DDimer No results for input(s): DDIMER in the last 168 hours.   Radiology    No results found.  Cardiac Studies    ECHO 03/18/2020:  1. Left ventricular ejection fraction, by estimation, is <20%. The left  ventricle has severely decreased function. The left ventricle demonstrates  regional wall motion abnormalities (see scoring diagram/findings for  description). The left ventricular  internal cavity size was moderately to severely dilated. There is mild  concentric left ventricular hypertrophy. Left ventricular diastolic  function could not be evaluated. There is diffuse severe hypokinesis with  akinesis of the distal to apical segments  of all walls.  2. Right ventricular systolic function was not well visualized. The right  ventricular size is not well visualized.  3. The mitral valve is normal in structure. Mild to moderate mitral valve  regurgitation.  4. The aortic valve is grossly normal. Aortic valve regurgitation is  mild. No aortic stenosis is present.   Comparison(s): Prior images unable to be directly viewed, comparison made  by report only.   Conclusion(s)/Recommendation(s): Severely reduced LVEF with both global  and focal wall motion abnormalities.    Patient Profile     59 y.o. male with CAD status post CABG, chronic systolic congestive heart failure secondary to ischemic cardiomyopathy, status post ICD, COPD, hypertension, hyperlipidemia,  right lung adenocarcinoma, prostate adenocarcinoma, admitted with COPD exacerbation and acute on chronic systolic diastolic heart failure  Assessment & Plan    # Acute on chronic systolic and diastolic heart failure/Cardiomyopathy since 2011 and has ICD: LVEF <20%. He required BiPAP and IV lasix. Baseline weight 50-53kg. His weight is down to 54.5kg. He was net +1220 mL yesterday, down 7.6L since admission. Volume status is stable and hypotensive -heis now off lasix. Renal function is stable.  - appears to be about net even with lasix   #Hypotension --holding Entresto, decreased BB - resumed lasix  Continue 2g sodium restriction. He struggles with compliance with this diet. He noted to nutrition that he has been eating fast food and convenience stores. Appreciate nutrition seeing him.  Have to continue to hold Entresto and will give maintenance dose lasix.  # CAD s/p QAST(4196) hx STEMI 2010, 2011, PCI SVG to LAD:  # Hyperlipidemia:  Not an active issue. Continue aspirin, atorvastatin, and switched to metoprolol succinate. When BP allows  # Adenocarcinoma of prostate and R lungand recent anemia:  Chemotherapy with Dr. [name]. Chronic home 02  # COPD/hypoxia: On home inhalers.   For questions or updates, please contact [place] Please consult [email or web address] for contact info under   Pixie Casino, MD, FACC, [place] Director of the [facility]  [facility] Diplomate of the American Board of Clinical Lipidology Attending Cardiologist  Direct Dial: [phone]  Fax: [phone]  Website:  [email or web address]  Pixie Casino, MD  03/26/2020, 11:13 AM

## 2020-03-26 NOTE — Progress Notes (Signed)
PROGRESS NOTE    [name]  UUV:[id number] DOB: 1961/02/23 DOA: 03/18/2020 PCP: Garrett Serene, MD   Brief Narrative:  HPI on 03/18/2020 by Dr. Early Osmond [name] is a 59 y.o. male with medical history significant of coronary artery disease status post CABG, ischemic cardiomyopathy with ejection fraction of 20 to 25% status post ICD placement, chronic hypoxemic respiratory failure secondary to underlying COPD-on 4 L of oxygen via nasal cannulae at home, recurrent right lung cancer-currently getting systemic chemotherapy, chemotherapy-induced anemia status post recent PRBC transfusion, prostate cancer presents to emergency department with shortness of breath since 2 weeks.  Patient tells me that that he has shortness of breath since 2 weeks, which is getting worse, associated with leg swelling, orthopnea, PND and weight gain of 20 pounds in last 2 months.  Denies chest pain, fever, chills, cough, congestion, nausea, vomiting, abdominal pain, urinary or bowel changes.  He is compliant with his home medication however he eats high sodium diet.  He cooks for himself and eats bacon/red meat. "I like salt in my food".  He is on 4 L of oxygen via nasal cannulae at home.  Denies current cigarette smoking, alcohol, illicit drug use.  Has history of non-small cell lung cancer diagnosed in February 2015,  Recurrent lung cancer diagnosed in September 2020 currently getting chemotherapy every 3 weeks.  Followed by oncology Dr. Julien Norman.  Prostate adenocarcinoma diagnosed in November 2019-followed by Dr. Tammi Norman. He was transfused PRBC yesterday due to chemotherapy-induced anemia.  He is fully vaccinated against COVID-19.  He tells me that he admitted at [facility] for pneumonia and received antibiotics.  Interim history Admitted with respiratory failure CHF exacerbation.  Cardiology consulted and following. Assessment & Plan   Acute on chronic hypoxic respiratory failure  secondary to acute on chronic combined systolic and CHF exacerbation/ischemic cardiomyopathy -Patient required BiPAP on admission as he presented with symptoms and signs of fluid overload including shortness of breath, leg swelling, orthopnea and weight gain -Currently remains on 5 L of supplemental oxygen -BNP 275 0.7; high-sensitivity troponin 51 -Patient was placed on IV Lasix however this was held due to hypotension -Echocardiogram on 03/18/2020 showed an EF of less than 20%, LV severely decreased function with regional wall motion abnormalities.  Diffuse severe hypokinesis with akinesis of the distal to apical segments. -Patient does have ICD in place -Cardiology consulted and appreciated -on 10/27, Discussed with Dr. Debara Norman, cardiology today, will hold Entresto, reduce the dose of metoprolol and possibly give dose of IV Lasix on 03/26/2020 -Patient did have a urine output of 1525 cc over the past 24 hours however this is likely secondary to [facility] which she did receive on 10/27 -Discussed with cardiology, will given one dose of IV lasix 40mg  today -Monitor intake and output, daily weights  Coronary artery disease -History of CABG -Currently chest pain-free -Continue aspirin, statin   Essential hypertension complicated with hypotension -BP currently stable today, currently 107/68 -Discussed with cardiology on 03/25/2020, discontinued Entresto and reduced dose of metoprolol -Continue to monitor closely  Hyperlipidemia -Continue statin  Adenocarcinoma of the right lung -Patient is followed by Dr. Earlie Norman and has been receiving systemic chemotherapy every 3 weeks  Prostate adenocarcinoma -Diagnosed in November 2019.  Patient did have he is currently being followed by Dr. Tammi Norman seed implants.  COPD -Currently no wheezing, continue home inhalers  Depression/anxiety -Continue Wellbutrin  Chemotherapy-induced anemia -Patient received 2 units PRBC prior to admission    -Today to monitor H&H intermittently  Goals of care -Palliative care consulted and appreciated -Currently patient DNI -Discussed with cardiology, does not feel that patient is a candidate for advanced heart failure treatments at this time.  Recommended to discuss with palliative care again. -Reconsulted palliative care for further management  DVT Prophylaxis Lovenox  Code Status: DNI (partial)  Family Communication: None at bedside  Disposition Plan:  Status is: Inpatient  Remains inpatient appropriate because:Hemodynamically unstable   Dispo: The patient is from: Home              Anticipated d/c is to: Home              Anticipated d/c date is: 3 days              Patient currently is not medically stable to d/c.  Consultants Cardiology Palliative care  Procedures  Echocardiogram  Antibiotics   Anti-infectives (From admission, onward)   None      Subjective:   [name] seen and examined today.  Feels breathing has improved today, but continues to state he is not at baseline.  Denies current chest pain, abdominal pain, nausea or vomiting, diarrhea constipation, dizziness or headache.   Objective:   Vitals:   03/26/20 0400 03/26/20 0800 03/26/20 0900 03/26/20 0919  BP: 92/72 111/72 107/68   Pulse: 93     Resp: 17 18 18    Temp: 98.5 F (36.9 C) 97.6 F (36.4 C)    TempSrc: Oral Oral    SpO2: 100%  96% 96%  Weight: 54.2 kg     Height:        Intake/Output Summary (Last 24 hours) at 03/26/2020 1207 Last data filed at 03/26/2020 8115 Gross per 24 hour  Intake 1558 ml  Output 1000 ml  Net 558 ml   Filed Weights   03/24/20 0414 03/25/20 0034 03/26/20 0400  Weight: 54.4 kg 54.5 kg 54.2 kg   Exam  General: Well developed, well nourished, NAD, appears stated age  80: NCAT, mucous membranes moist.   Cardiovascular: S1 S2 auscultated, RRR  Respiratory: Diminished breath sounds  Abdomen: Soft, nontender, nondistended, + bowel  sounds  Extremities: warm dry without cyanosis clubbing  Neuro: AAOx3,   Psych: flat however appropriate    Data Reviewed: I have personally reviewed following labs and imaging studies  CBC: Recent Labs  Lab 03/26/20 0229  HGB 9.1*  HCT 72.6*   Basic Metabolic Panel: Recent Labs  Lab 03/21/20 0328 03/22/20 0425 03/23/20 1046 03/25/20 0515 03/26/20 0229  NA 136 134* 138 137 135  K 3.8 4.5 4.5 4.8 4.6  CL 89* 93* 95* 98 97*  CO2 38* 25 31 28 29   GLUCOSE 97 94 105* 105* 112*  BUN 15 20 21* 18 20  CREATININE 0.90 0.89 1.06 0.89 0.99  CALCIUM 9.5 9.0 9.2 9.3 9.1   GFR: Estimated Creatinine Clearance: 63.1 mL/min (by C-G formula based on SCr of 0.99 mg/dL). Liver Function Tests: No results for input(s): AST, ALT, ALKPHOS, BILITOT, PROT, ALBUMIN in the last 168 hours. No results for input(s): LIPASE, AMYLASE in the last 168 hours. No results for input(s): AMMONIA in the last 168 hours. Coagulation Profile: No results for input(s): INR, PROTIME in the last 168 hours. Cardiac Enzymes: No results for input(s): CKTOTAL, CKMB, CKMBINDEX, TROPONINI in the last 168 hours. BNP (last 3 results) No results for input(s): PROBNP in the last 8760 hours. HbA1C: No results for input(s): HGBA1C in the last 72 hours. CBG: Recent Labs  Lab 03/22/20  [place]   Lipid Profile: No results for input(s): CHOL, HDL, LDLCALC, TRIG, CHOLHDL, LDLDIRECT in the last 72 hours. Thyroid Function Tests: No results for input(s): TSH, T4TOTAL, FREET4, T3FREE, THYROIDAB in the last 72 hours. Anemia Panel: No results for input(s): VITAMINB12, FOLATE, FERRITIN, TIBC, IRON, RETICCTPCT in the last 72 hours. Urine analysis:    Component Value Date/Time   COLORURINE STRAW (A) 12/12/2018 1927   APPEARANCEUR CLEAR 12/12/2018 1927   LABSPEC 1.011 12/12/2018 1927   PHURINE 5.0 12/12/2018 1927   GLUCOSEU NEGATIVE 12/12/2018 1927   HGBUR NEGATIVE 12/12/2018 1927   BILIRUBINUR NEGATIVE  12/12/2018 [place] NEGATIVE 12/12/2018 1927   PROTEINUR NEGATIVE 12/12/2018 1927   UROBILINOGEN 0.2 08/06/2013 1436   NITRITE NEGATIVE 12/12/2018 1927   LEUKOCYTESUR NEGATIVE 12/12/2018 1927   Sepsis Labs: @LABRCNTIP (procalcitonin:4,lacticidven:4)  ) Recent Results (from the past 240 hour(s))  Respiratory Panel by RT PCR (Flu A&B, Covid) - Nasopharyngeal Swab     Status: None   Collection Time: 03/18/20  9:21 AM   Specimen: Nasopharyngeal Swab  Result Value Ref Range Status   SARS Coronavirus 2 by RT PCR NEGATIVE NEGATIVE Final    Comment: (NOTE) SARS-CoV-2 target nucleic acids are NOT DETECTED.  The SARS-CoV-2 RNA is generally detectable in upper respiratoy specimens during the acute phase of infection. The lowest concentration of SARS-CoV-2 viral copies this assay can detect is 131 copies/mL. A negative result does not preclude SARS-Cov-2 infection and should not be used as the sole basis for treatment or other patient management decisions. A negative result may occur with  improper specimen collection/handling, submission of specimen other than nasopharyngeal swab, presence of viral mutation(s) within the areas targeted by this assay, and inadequate number of viral copies (<131 copies/mL). A negative result must be combined with clinical observations, patient history, and epidemiological information. The expected result is Negative.  Fact Sheet for Patients:  PinkCheek.be  Fact Sheet for Healthcare Providers:  GravelBags.it  This test is no t yet approved or cleared by the [place] FDA and  has been authorized for detection and/or diagnosis of SARS-CoV-2 by FDA under an Emergency Use Authorization (EUA). This EUA will remain  in effect (meaning this test can be used) for the duration of the COVID-19 declaration under Section 564(b)(1) of the Act, 21 U.S.C. section 360bbb-3(b)(1), unless the  authorization is terminated or revoked sooner.     Influenza A by PCR NEGATIVE NEGATIVE Final   Influenza B by PCR NEGATIVE NEGATIVE Final    Comment: (NOTE) The Xpert Xpress SARS-CoV-2/FLU/RSV assay is intended as an aid in  the diagnosis of influenza from Nasopharyngeal swab specimens and  should not be used as a sole basis for treatment. Nasal washings and  aspirates are unacceptable for Xpert Xpress SARS-CoV-2/FLU/RSV  testing.  Fact Sheet for Patients: PinkCheek.be  Fact Sheet for Healthcare Providers: GravelBags.it  This test is not yet approved or cleared by the [place] FDA and  has been authorized for detection and/or diagnosis of SARS-CoV-2 by  FDA under an Emergency Use Authorization (EUA). This EUA will remain  in effect (meaning this test can be used) for the duration of the  Covid-19 declaration under Section 564(b)(1) of the Act, 21  U.S.C. section 360bbb-3(b)(1), unless the authorization is  terminated or revoked. Performed at [facility], [place] [address] [place], [place] [address]   Blood culture (routine x 2)     Status: None   Collection Time: 03/18/20 10:05  AM   Specimen: BLOOD  Result Value Ref Range Status   Specimen Description BLOOD RIGHT ANTECUBITAL  Final   Special Requests   Final    BOTTLES DRAWN AEROBIC AND ANAEROBIC Blood Culture adequate volume   Culture   Final    NO GROWTH 5 DAYS Performed at [facility], [address] [place], [place] [address]    Report Status 03/23/2020 FINAL  Final  Blood culture (routine x 2)     Status: None   Collection Time: 03/18/20 10:14 AM   Specimen: BLOOD LEFT ARM  Result Value Ref Range Status   Specimen Description BLOOD LEFT ARM  Final   Special Requests   Final    BOTTLES DRAWN AEROBIC AND ANAEROBIC Blood Culture adequate volume   Culture   Final    NO GROWTH 5 DAYS Performed at [facility], [place] [address],  [place],  [address]    Report Status 03/23/2020 FINAL  Final      Radiology Studies: No results found.   Scheduled Meds: . aspirin EC  81 mg Oral Daily  . atorvastatin  40 mg Oral Daily  . buPROPion  150 mg Oral Daily  . diclofenac Sodium  4 g Topical Daily  . enoxaparin (LOVENOX) injection  40 mg Subcutaneous Q24H  . folic acid  1 mg Oral Daily  . lidocaine  1 patch Transdermal Q24H  . metoprolol succinate  12.5 mg Oral Daily  . mometasone-formoterol  2 puff Inhalation BID  . sodium chloride flush  3 mL Intravenous Q12H  . umeclidinium bromide  1 puff Inhalation Daily   Continuous Infusions: . sodium chloride       LOS: 8 days   Time Spent in minutes   45 minutes  Jariah Jarmon D.O. on 03/26/2020 at 12:07 PM  Between 7am to 7pm - Please see pager noted on [email or web address]  After 7pm go to [email or web address]  And look for the night coverage person covering for me after hours  [facility] Office  [phone]

## 2020-03-26 NOTE — TOC Progression Note (Signed)
Transition of Care [facility]) - Progression Note    Patient Details  Name: [name] MRN: [id number] Date of Birth: January 20, 1961  Transition of Care [facility]) CM/SW Contact  Zenon Mayo, RN Phone Number: 03/26/2020, 10:11 PM  Clinical Narrative:    From home, respiratory failure CHF exacerbation, currently getting chemo every 3 weeks, has issues with transportation to cancer center , HF CSW has spoken with patient and trying to assist with the transportation .  TOC team will continue to follow.       Expected Discharge Plan and Services         Living arrangements for the past 2 months: Apartment                                       Social Determinants of Health (SDOH) Interventions Food Insecurity Interventions: Intervention Not Indicated Financial Strain Interventions: Intervention Not Indicated Transportation Interventions: [facility]  Readmission Risk Interventions No flowsheet data found.

## 2020-03-26 NOTE — Progress Notes (Signed)
Palliative:  Chart reviewed and update received from medical team. Attempted to see patient. He was sleeping when I entered. Tells me he is not interested in talking today as he is too tired. I gently asked if I could come back later as we needed to further discuss his care. He politely requested that we come back tomorrow. Will ask team to follow up tomorrow.   Juel Burrow, DNP, AGNP-C Palliative Medicine Team Team Phone # [phone]  Pager # [phone]  NO CHARGE

## 2020-03-27 ENCOUNTER — Encounter: Payer: Self-pay | Admitting: General Practice

## 2020-03-27 DIAGNOSIS — I255 Ischemic cardiomyopathy: Secondary | ICD-10-CM | POA: Diagnosis not present

## 2020-03-27 DIAGNOSIS — Z515 Encounter for palliative care: Secondary | ICD-10-CM | POA: Diagnosis not present

## 2020-03-27 DIAGNOSIS — I5043 Acute on chronic combined systolic (congestive) and diastolic (congestive) heart failure: Secondary | ICD-10-CM | POA: Diagnosis not present

## 2020-03-27 DIAGNOSIS — J438 Other emphysema: Secondary | ICD-10-CM | POA: Diagnosis not present

## 2020-03-27 DIAGNOSIS — I952 Hypotension due to drugs: Secondary | ICD-10-CM | POA: Diagnosis not present

## 2020-03-27 LAB — BASIC METABOLIC PANEL
Anion gap: 10 (ref 5–15)
BUN: 22 mg/dL — ABNORMAL HIGH (ref 6–20)
CO2: 29 mmol/L (ref 22–32)
Calcium: 9 mg/dL (ref 8.9–10.3)
Chloride: 97 mmol/L — ABNORMAL LOW (ref 98–111)
Creatinine, Ser: 0.91 mg/dL (ref 0.61–1.24)
GFR, Estimated: >60 mL/min (ref >60)
Glucose, Bld: 117 mg/dL — ABNORMAL HIGH (ref 70–99)
Potassium: 4.4 mmol/L (ref 3.5–5.1)
Sodium: 136 mmol/L (ref 135–145)

## 2020-03-27 MED ORDER — FUROSEMIDE 10 MG/ML IJ SOLN
40.0000 mg | Freq: Once | INTRAMUSCULAR | Status: AC
  Administered 2020-03-27: 40 mg via INTRAVENOUS
  Filled 2020-03-27: qty 4

## 2020-03-27 MED ORDER — FUROSEMIDE 40 MG PO TABS
40.0000 mg | ORAL_TABLET | Freq: Every day | ORAL | Status: DC
  Administered 2020-03-27 – 2020-03-31 (×5): 40 mg via ORAL
  Filled 2020-03-27 (×5): qty 1

## 2020-03-27 NOTE — Progress Notes (Signed)
Daily Progress Note   Patient Name: [name]       Date: 03/27/2020 DOB: [birth year]  Age: 59 y.o. MRN#: [id number] Attending Physician: Cristal Ford, DO Primary Care Physician: [name], MD Admit Date: 03/18/2020  Reason for Consultation/Follow-up: To discuss complex medical decision making related to patient's goals of care  Subjective: Talked with patient at bedside.  Pain is improved.  He feels breathing overall is improved.  I ask what the doctors have told him.  He states they have told him that his heart is so weak they can't make it better.  This is frightening to him.  He states he can get out of bed only to pivot to bedside commode before he is SOB.  I asked [name] what we are going to do if we can't make him better.  He replies he will have to go to rehab.    We talked about his mother ([name]) who came into the hospital.  [name] says it was hard but he talked with her about his health.  I promised [name] to follow up with him over the weekend.  I then spoke with [name], [name]'s mom.  She asked if the family could come in to talk with me and we made plans for Sunday at 2:00 pm.   Assessment: Patient A&O, aware of his very advanced heart failure and frightened due to his health issues.  Considering rehab.   Patient Profile/HPI:  59 y.o. male  with past medical history of CAD s/p CABG, CHF with EF < 20% ICD in place, COPD on 4L, lung cancer in 2015 and again in 2019 he has been on palliative chemo until his recent visit with Dr. [name] who recommended pausing chemo due to intolerance, and prostate cancer diagnosed in 03/2018 who was admitted on 03/18/2020 with volume overload and decompensated heart failure.  He is feeling better with  aggressive diuresis.  PMT was asked to see for goals of care.   Length of Stay: 9   Vital Signs: BP 103/76   Pulse 97   Temp 98.6 F (37 C) (Oral)   Resp (!) 23   Ht 5' 7.99" (1.727 m)   Wt 54.2 kg Comment: scale B  SpO2 95%   BMI 18.17 kg/m  SpO2: SpO2: 95 % O2 Device:  O2 Device: Nasal Cannula O2 Flow Rate: O2 Flow Rate (L/min): 5 L/min       Palliative Assessment/Data: 40%     Palliative Care Plan    Recommendations/Plan:  PMT family meeting Sunday at 2:00 pm.  Patient considering dc to rehab.  Will attempt to complete MOST form.  Recommend Palliative to follow at next venue  Code Status:  Limited code DNI  Prognosis:  Months given advanced HF.  Discharge Planning:  To Be Determined strongly recommend Palliative to follow outpatient  Care plan was discussed with Dr. Ree Kida, patient and mother.  Thank you for allowing the Palliative Medicine Team to assist in the care of this patient.  Total time spent:  35 min.     Greater than 50%  of this time was spent counseling and coordinating care related to the above assessment and plan.  Florentina Jenny, PA-C Palliative Medicine  Please contact Palliative MedicineTeam phone at [phone] for questions and concerns between 7 am - 7 pm.   Please see AMION for individual provider pager numbers.

## 2020-03-27 NOTE — Progress Notes (Signed)
Patient very anxious and SHOB, NRB mask replaced on patient (he requested that it be taken off about 37min ago). Patient breathing better now and says he feels more comfortable, will keep NRB on for now and continue to monitor.

## 2020-03-27 NOTE — Progress Notes (Signed)
Progress Note  Patient Name: [name] Date of Encounter: 03/27/2020  [facility] Cardiologist: Kirk Ruths, MD   Subjective   BP mildly improved today. Net negative almost 938ml yesterday after single 40 mg IV lasix. Creatinine stable.  Inpatient Medications    Scheduled Meds: . aspirin EC  81 mg Oral Daily  . atorvastatin  40 mg Oral Daily  . buPROPion  150 mg Oral Daily  . diclofenac Sodium  4 g Topical Daily  . enoxaparin (LOVENOX) injection  40 mg Subcutaneous Q24H  . folic acid  1 mg Oral Daily  . lidocaine  1 patch Transdermal Q24H  . metoprolol succinate  12.5 mg Oral Daily  . mometasone-formoterol  2 puff Inhalation BID  . sodium chloride flush  3 mL Intravenous Q12H  . umeclidinium bromide  1 puff Inhalation Daily   Continuous Infusions: . sodium chloride     PRN Meds: sodium chloride, acetaminophen, fentaNYL (SUBLIMAZE) injection, ipratropium, ondansetron (ZOFRAN) IV, prochlorperazine, sodium chloride flush   Vital Signs    Vitals:   03/27/20 0336 03/27/20 0400 03/27/20 0713 03/27/20 0851  BP: 90/72 115/66  112/79  Pulse:    97  Resp:  20    Temp:  97.8 F (36.6 C)    TempSrc:  Oral    SpO2: 98%     Weight:   54.2 kg   Height:        Intake/Output Summary (Last 24 hours) at 03/27/2020 0929 Last data filed at 03/27/2020 0800 Gross per 24 hour  Intake 600 ml  Output 1525 ml  Net -925 ml   Last 3 Weights 03/27/2020 03/26/2020 03/25/2020  Weight (lbs) 119 lb 8 oz 119 lb 7.8 oz 120 lb 3.2 oz  Weight (kg) 54.205 kg 54.2 kg 54.522 kg      Telemetry    SR with PVCs - Personally Reviewed  ECG    N/A  Physical Exam   GEN: No acute distress. Neck: No JVD Cardiac: RRR, no murmurs, rubs, or gallops.  Respiratory: Clear to auscultation bilaterally. GI: Soft, nontender, non-distended  MS: 1+ edema; No deformity. Neuro:  Nonfocal  Psych: Normal affect   Labs    High Sensitivity Troponin:   Recent Labs  Lab 03/18/20 0904  03/18/20 1131  TROPONINIHS 51* 63*      Chemistry Recent Labs  Lab 03/25/20 0515 03/26/20 0229 03/27/20 0335  NA 137 135 136  K 4.8 4.6 4.4  CL 98 97* 97*  CO2 28 29 29   GLUCOSE 105* 112* 117*  BUN 18 20 22*  CREATININE 0.89 0.99 0.91  CALCIUM 9.3 9.1 9.0  GFRNONAA >60 >60 >60  ANIONGAP 11 9 10      Hematology Recent Labs  Lab 03/26/20 0229  HGB 9.1*  HCT 30.0*    BNP No results for input(s): BNP, PROBNP in the last 168 hours.   DDimer No results for input(s): DDIMER in the last 168 hours.   Radiology    No results found.  Cardiac Studies    ECHO 03/18/2020:  1. Left ventricular ejection fraction, by estimation, is <20%. The left  ventricle has severely decreased function. The left ventricle demonstrates  regional wall motion abnormalities (see scoring diagram/findings for  description). The left ventricular  internal cavity size was moderately to severely dilated. There is mild  concentric left ventricular hypertrophy. Left ventricular diastolic  function could not be evaluated. There is diffuse severe hypokinesis with  akinesis of the distal to apical segments  of  all walls.  2. Right ventricular systolic function was not well visualized. The right  ventricular size is not well visualized.  3. The mitral valve is normal in structure. Mild to moderate mitral valve  regurgitation.  4. The aortic valve is grossly normal. Aortic valve regurgitation is  mild. No aortic stenosis is present.   Comparison(s): Prior images unable to be directly viewed, comparison made  by report only.   Conclusion(s)/Recommendation(s): Severely reduced LVEF with both global  and focal wall motion abnormalities.    Patient Profile     59 y.o. male with CAD status post CABG, chronic systolic congestive heart failure secondary to ischemic cardiomyopathy, status post ICD, COPD, hypertension, hyperlipidemia, right lung adenocarcinoma, prostate adenocarcinoma, admitted  with COPD exacerbation and acute on chronic systolic diastolic heart failure  Assessment & Plan    # Acute on chronic systolic and diastolic heart failure/Cardiomyopathy since 2011 and has ICD: LVEF <20%. He required BiPAP and IV lasix. Baseline weight 50-53kg. His weight is down to 54.5kg. He was net +1220 mL yesterday, down 7.6L since admission. Volume status is stable and hypotensive -heis now off lasix. Renal function is stable.  - Net negative about 900 CC with IV lasix yesterday, would establish lasix 40 mg po daily for maintenance.   #Hypotension --holding Entresto, decreased BB - resumed lasix. BP will not allow additional meds - did not tolerate ARB or Entresto. Could consider hydralazine/nitrate if he has persistent NYHA Class III/IV symptoms with adequate BP.  # CAD s/p NOIB(7048) hx STEMI 2010, 2011, PCI SVG to LAD:  # Hyperlipidemia:  Not an active issue. Continue aspirin, atorvastatin, and switched to metoprolol succinate. When BP allows, consider additional HF meds as above.  # Adenocarcinoma of prostate and R lungand recent anemia:  Chemotherapy with Dr. [name]. Chronic home 02  # COPD/hypoxia: On home inhalers.   For questions or updates, please contact [place] Please consult [email or web address] for contact info under   Pixie Casino, MD, FACC, [place] Director of the [facility] Diplomate of the American Board of Clinical Lipidology Attending Cardiologist  Direct Dial: [phone]  Fax: [phone]  Website:  [email or web address]  Pixie Casino, MD  03/27/2020, 9:29 AM

## 2020-03-27 NOTE — Progress Notes (Signed)
Outpatient CSW met with pt to inform that [facility] CSWs had confirmed pt can use [facility] for periodic appts and was already referred for assistance with 11/18 appt.  CSW also informed pt we could help with future [facility] appts.  Pt very appreciative and expresses no further concerns at this time.  Jorge Ny, LCSW Clinical Social Worker [facility] Desk#: [phone] Cell#: [phone]

## 2020-03-27 NOTE — Progress Notes (Signed)
PROGRESS NOTE    [name]  DGL:[id number] DOB: Jun 02, 1960 DOA: 03/18/2020 PCP: [name], MD   Brief Narrative:  HPI on 03/18/2020 by Dr. Early Osmond [name] is a 59 y.o. male with medical history significant of coronary artery disease status post CABG, ischemic cardiomyopathy with ejection fraction of 20 to 25% status post ICD placement, chronic hypoxemic respiratory failure secondary to underlying COPD-on 4 L of oxygen via nasal cannulae at home, recurrent right lung cancer-currently getting systemic chemotherapy, chemotherapy-induced anemia status post recent PRBC transfusion, prostate cancer presents to emergency department with shortness of breath since 2 weeks.  Patient tells me that that he has shortness of breath since 2 weeks, which is getting worse, associated with leg swelling, orthopnea, PND and weight gain of 20 pounds in last 2 months.  Denies chest pain, fever, chills, cough, congestion, nausea, vomiting, abdominal pain, urinary or bowel changes.  He is compliant with his home medication however he eats high sodium diet.  He cooks for himself and eats bacon/red meat. "I like salt in my food".  He is on 4 L of oxygen via nasal cannulae at home.  Denies current cigarette smoking, alcohol, illicit drug use.  Has history of non-small cell lung cancer diagnosed in February 2015,  Recurrent lung cancer diagnosed in September 2020 currently getting chemotherapy every 3 weeks.  Followed by oncology Dr. [name].  Prostate adenocarcinoma diagnosed in November 2019-followed by Dr. [name]. He was transfused PRBC yesterday due to chemotherapy-induced anemia.  He is fully vaccinated against COVID-19.  He tells me that he admitted at [facility] for pneumonia and received antibiotics.  Interim history Admitted with respiratory failure CHF exacerbation.  Cardiology consulted and following. Assessment & Plan   Acute on chronic hypoxic respiratory failure  secondary to acute on chronic combined systolic and CHF exacerbation/ischemic cardiomyopathy -Patient required BiPAP on admission as he presented with symptoms and signs of fluid overload including shortness of breath, leg swelling, orthopnea and weight gain -Currently remains on 5 L of supplemental oxygen -BNP 275 0.7; high-sensitivity troponin 51 -Patient was placed on IV Lasix however this was held due to hypotension -Echocardiogram on 03/18/2020 showed an EF of less than 20%, LV severely decreased function with regional wall motion abnormalities.  Diffuse severe hypokinesis with akinesis of the distal to apical segments. -Patient does have ICD in place -Cardiology consulted and appreciated -on 10/27, Discussed with Dr. Debara Pickett, cardiology today, will hold Entresto, reduce the dose of metoprolol and possibly give dose of IV Lasix on 03/26/2020 -Patient did have a urine output of 1525 cc over the past 24 hours however this is likely secondary to [facility] which she did receive on 10/27 -Patient was given 1 dose of IV 40 mg Lasix on 03/26/2020 and had 1725 urine output in the past 24 hours -Per cardiology, continue Lasix 40 mg p.o. daily  -Continue to monitor intake and output, daily weights  Coronary artery disease -History of CABG -Currently chest pain-free -Continue aspirin, statin   Essential hypertension complicated with hypotension -BP currently stable today, currently 107/68 -Discussed with cardiology on 03/25/2020, discontinued Entresto and reduced dose of metoprolol -Continue to monitor closely  Hyperlipidemia -Continue statin  Adenocarcinoma of the right lung -Patient is followed by Dr. [name] and has been receiving systemic chemotherapy every 3 weeks  Prostate adenocarcinoma -Diagnosed in November 2019.  Patient did have he is currently being followed by Dr. [name] seed implants.  COPD -Currently no wheezing, continue home inhalers  Depression/anxiety -Continue  Wellbutrin  Chemotherapy-induced anemia -Patient received 2 units PRBC prior to admission  -Today to monitor H&H intermittently  Goals of care -Palliative care consulted and appreciated -Currently patient DNI -Discussed with cardiology, does not feel that patient is a candidate for advanced heart failure treatments at this time.  Recommended to discuss with palliative care again. -Reconsulted palliative care for further management-currently pending consultation as patient did not speak with him on 03/27/2020 as he was too tired  DVT Prophylaxis Lovenox  Code Status: DNI (partial)  Family Communication: None at bedside  Disposition Plan:  Status is: Inpatient  Remains inpatient appropriate because:Hemodynamically unstable   Dispo: The patient is from: Home              Anticipated d/c is to: Home              Anticipated d/c date is: 3 days              Patient currently is not medically stable to d/c.  Consultants Cardiology Palliative care  Procedures  Echocardiogram  Antibiotics   Anti-infectives (From admission, onward)   None      Subjective:   [name] seen and examined today.  Continues to have shortness of breath but feels it is mildly improved however not back to baseline.  Denies current chest pain abdominal pain, nausea or vomiting, constipation or diarrhea, dizziness or headache.  Patient feels very anxious about going home.   Objective:   Vitals:   03/27/20 0336 03/27/20 0400 03/27/20 0713 03/27/20 0851  BP: 90/72 115/66  112/79  Pulse:    97  Resp:  20    Temp:  97.8 F (36.6 C)    TempSrc:  Oral    SpO2: 98%     Weight:   54.2 kg   Height:        Intake/Output Summary (Last 24 hours) at 03/27/2020 1052 Last data filed at 03/27/2020 0800 Gross per 24 hour  Intake 600 ml  Output 1625 ml  Net -1025 ml   Filed Weights   03/25/20 0034 03/26/20 0400 03/27/20 0713  Weight: 54.5 kg 54.2 kg 54.2 kg   Exam  General: Well developed,  chronically ill-appearing, NAD  HEENT: NCAT, mucous membranes moist.   Cardiovascular: S1 S2 auscultated, RRR  Respiratory: Diminished breath sounds  Abdomen: Soft, nontender, nondistended, + bowel sounds  Extremities: warm dry without cyanosis clubbing or edema  Neuro: AAOx3, nonfocal  Psych: anxious   Data Reviewed: I have personally reviewed following labs and imaging studies  CBC: Recent Labs  Lab 03/26/20 0229  HGB 9.1*  HCT 79.8*   Basic Metabolic Panel: Recent Labs  Lab 03/22/20 0425 03/23/20 1046 03/25/20 0515 03/26/20 0229 03/27/20 0335  NA 134* 138 137 135 136  K 4.5 4.5 4.8 4.6 4.4  CL 93* 95* 98 97* 97*  CO2 25 31 28 29 29   GLUCOSE 94 105* 105* 112* 117*  BUN 20 21* 18 20 22*  CREATININE 0.89 1.06 0.89 0.99 0.91  CALCIUM 9.0 9.2 9.3 9.1 9.0   GFR: Estimated Creatinine Clearance: 68.7 mL/min (by C-G formula based on SCr of 0.91 mg/dL). Liver Function Tests: No results for input(s): AST, ALT, ALKPHOS, BILITOT, PROT, ALBUMIN in the last 168 hours. No results for input(s): LIPASE, AMYLASE in the last 168 hours. No results for input(s): AMMONIA in the last 168 hours. Coagulation Profile: No results for input(s): INR, PROTIME in the last 168 hours. Cardiac Enzymes: No results for input(s):  CKTOTAL, CKMB, CKMBINDEX, TROPONINI in the last 168 hours. BNP (last 3 results) No results for input(s): PROBNP in the last 8760 hours. HbA1C: No results for input(s): HGBA1C in the last 72 hours. CBG: Recent Labs  Lab 03/22/20 1556  GLUCAP 79   Lipid Profile: No results for input(s): CHOL, HDL, LDLCALC, TRIG, CHOLHDL, LDLDIRECT in the last 72 hours. Thyroid Function Tests: No results for input(s): TSH, T4TOTAL, FREET4, T3FREE, THYROIDAB in the last 72 hours. Anemia Panel: No results for input(s): VITAMINB12, FOLATE, FERRITIN, TIBC, IRON, RETICCTPCT in the last 72 hours. Urine analysis:    Component Value Date/Time   COLORURINE STRAW (A) 12/12/2018 1927     APPEARANCEUR CLEAR 12/12/2018 1927   LABSPEC 1.011 12/12/2018 1927   PHURINE 5.0 12/12/2018 1927   GLUCOSEU NEGATIVE 12/12/2018 1927   HGBUR NEGATIVE 12/12/2018 1927   BILIRUBINUR NEGATIVE 12/12/2018 [place] NEGATIVE 12/12/2018 1927   PROTEINUR NEGATIVE 12/12/2018 1927   UROBILINOGEN 0.2 08/06/2013 1436   NITRITE NEGATIVE 12/12/2018 1927   LEUKOCYTESUR NEGATIVE 12/12/2018 1927   Sepsis Labs: @LABRCNTIP (procalcitonin:4,lacticidven:4)  ) Recent Results (from the past 240 hour(s))  Respiratory Panel by RT PCR (Flu A&B, Covid) - Nasopharyngeal Swab     Status: None   Collection Time: 03/18/20  9:21 AM   Specimen: Nasopharyngeal Swab  Result Value Ref Range Status   SARS Coronavirus 2 by RT PCR NEGATIVE NEGATIVE Final    Comment: (NOTE) SARS-CoV-2 target nucleic acids are NOT DETECTED.  The SARS-CoV-2 RNA is generally detectable in upper respiratoy specimens during the acute phase of infection. The lowest concentration of SARS-CoV-2 viral copies this assay can detect is 131 copies/mL. A negative result does not preclude SARS-Cov-2 infection and should not be used as the sole basis for treatment or other patient management decisions. A negative result may occur with  improper specimen collection/handling, submission of specimen other than nasopharyngeal swab, presence of viral mutation(s) within the areas targeted by this assay, and inadequate number of viral copies (<131 copies/mL). A negative result must be combined with clinical observations, patient history, and epidemiological information. The expected result is Negative.  Fact Sheet for Patients:  PinkCheek.be  Fact Sheet for Healthcare Providers:  GravelBags.it  This test is no t yet approved or cleared by the [place] FDA and  has been authorized for detection and/or diagnosis of SARS-CoV-2 by FDA under an Emergency Use Authorization (EUA). This  EUA will remain  in effect (meaning this test can be used) for the duration of the COVID-19 declaration under Section 564(b)(1) of the Act, 21 U.S.C. section 360bbb-3(b)(1), unless the authorization is terminated or revoked sooner.     Influenza A by PCR NEGATIVE NEGATIVE Final   Influenza B by PCR NEGATIVE NEGATIVE Final    Comment: (NOTE) The Xpert Xpress SARS-CoV-2/FLU/RSV assay is intended as an aid in  the diagnosis of influenza from Nasopharyngeal swab specimens and  should not be used as a sole basis for treatment. Nasal washings and  aspirates are unacceptable for Xpert Xpress SARS-CoV-2/FLU/RSV  testing.  Fact Sheet for Patients: PinkCheek.be  Fact Sheet for Healthcare Providers: GravelBags.it  This test is not yet approved or cleared by the [place] FDA and  has been authorized for detection and/or diagnosis of SARS-CoV-2 by  FDA under an Emergency Use Authorization (EUA). This EUA will remain  in effect (meaning this test can be used) for the duration of the  Covid-19 declaration under Section 564(b)(1) of the Act, 21  U.S.C. section  360bbb-3(b)(1), unless the authorization is  terminated or revoked. Performed at [facility], [place] [address], [place], [place] [address]   Blood culture (routine x 2)     Status: None   Collection Time: 03/18/20 10:05 AM   Specimen: BLOOD  Result Value Ref Range Status   Specimen Description BLOOD RIGHT ANTECUBITAL  Final   Special Requests   Final    BOTTLES DRAWN AEROBIC AND ANAEROBIC Blood Culture adequate volume   Culture   Final    NO GROWTH 5 DAYS Performed at [facility], [place] [address] [place], [place] [address]    Report Status 03/23/2020 FINAL  Final  Blood culture (routine x 2)     Status: None   Collection Time: 03/18/20 10:14 AM   Specimen: BLOOD LEFT ARM  Result Value Ref Range Status   Specimen Description BLOOD LEFT ARM  Final   Special  Requests   Final    BOTTLES DRAWN AEROBIC AND ANAEROBIC Blood Culture adequate volume   Culture   Final    NO GROWTH 5 DAYS Performed at [facility], [place] [address] [place], [place] [address]    Report Status 03/23/2020 FINAL  Final      Radiology Studies: No results found.   Scheduled Meds: . aspirin EC  81 mg Oral Daily  . atorvastatin  40 mg Oral Daily  . buPROPion  150 mg Oral Daily  . diclofenac Sodium  4 g Topical Daily  . enoxaparin (LOVENOX) injection  40 mg Subcutaneous Q24H  . folic acid  1 mg Oral Daily  . furosemide  40 mg Oral Daily  . lidocaine  1 patch Transdermal Q24H  . metoprolol succinate  12.5 mg Oral Daily  . mometasone-formoterol  2 puff Inhalation BID  . sodium chloride flush  3 mL Intravenous Q12H  . umeclidinium bromide  1 puff Inhalation Daily   Continuous Infusions: . sodium chloride       LOS: 9 days   Time Spent in minutes   45 minutes  Annie Saephan D.O. on 03/27/2020 at 10:52 AM  Between 7am to 7pm - Please see pager noted on [email or web address]  After 7pm go to [email or web address]  And look for the night coverage person covering for me after hours  [facility] Office  [phone]

## 2020-03-27 NOTE — Progress Notes (Signed)
Patient became [facility] briefly, RN helped patient to stay calm and catch his breath. Patient's sats WNL during event. Patient just gets [facility] with exertion.

## 2020-03-27 NOTE — Progress Notes (Signed)
[facility] CSW Progress Notes  Referral made to [employer] for ride to oncology follow up appointment at [facility] on 04/16/20.  They will reach out to patient, enroll him and explain program and how to use.   Edwyna Shell, LCSW Clinical Social Worker Phone:  [phone]

## 2020-03-28 DIAGNOSIS — J9621 Acute and chronic respiratory failure with hypoxia: Secondary | ICD-10-CM | POA: Diagnosis not present

## 2020-03-28 DIAGNOSIS — I255 Ischemic cardiomyopathy: Secondary | ICD-10-CM | POA: Diagnosis not present

## 2020-03-28 DIAGNOSIS — I5043 Acute on chronic combined systolic (congestive) and diastolic (congestive) heart failure: Secondary | ICD-10-CM | POA: Diagnosis not present

## 2020-03-28 LAB — BASIC METABOLIC PANEL
Anion gap: 8 (ref 5–15)
BUN: 20 mg/dL (ref 6–20)
CO2: 34 mmol/L — ABNORMAL HIGH (ref 22–32)
Calcium: 9.4 mg/dL (ref 8.9–10.3)
Chloride: 97 mmol/L — ABNORMAL LOW (ref 98–111)
Creatinine, Ser: 0.91 mg/dL (ref 0.61–1.24)
GFR, Estimated: >60 mL/min (ref >60)
Glucose, Bld: 102 mg/dL — ABNORMAL HIGH (ref 70–99)
Potassium: 4.1 mmol/L (ref 3.5–5.1)
Sodium: 139 mmol/L (ref 135–145)

## 2020-03-28 MED ORDER — LIDOCAINE 5 % EX PTCH: 1.0000 | MEDICATED_PATCH | CUTANEOUS | 0 refills | Status: DC

## 2020-03-28 MED ORDER — SODIUM CHLORIDE 0.9% FLUSH: 10.0000 mL | INTRAVENOUS | Status: DC | PRN

## 2020-03-28 MED ORDER — CHLORHEXIDINE GLUCONATE CLOTH 2 % EX PADS
6.0000 | MEDICATED_PAD | Freq: Every day | CUTANEOUS | Status: DC
  Administered 2020-03-29 – 2020-04-09 (×11): 6 via TOPICAL

## 2020-03-28 MED ORDER — DICLOFENAC SODIUM 1 % EX GEL: 4.0000 g | Freq: Every day | CUTANEOUS | 0 refills | Status: DC

## 2020-03-28 MED ORDER — SODIUM CHLORIDE 0.9% FLUSH
10.0000 mL | Freq: Two times a day (BID) | INTRAVENOUS | Status: DC
  Administered 2020-03-29 – 2020-04-02 (×10): 10 mL
  Administered 2020-04-03: 20 mL
  Administered 2020-04-03 – 2020-04-09 (×13): 10 mL

## 2020-03-28 MED ORDER — METOPROLOL SUCCINATE ER 25 MG PO TB24
12.5000 mg | ORAL_TABLET | Freq: Two times a day (BID) | ORAL | 0 refills | Status: DC
Start: 2020-03-28 — End: 2020-04-07

## 2020-03-28 MED ORDER — ACETAMINOPHEN 325 MG PO TABS: 650.0000 mg | ORAL_TABLET | ORAL | Status: DC | PRN

## 2020-03-28 MED ORDER — FUROSEMIDE 40 MG PO TABS: 40.0000 mg | ORAL_TABLET | Freq: Every day | ORAL | 0 refills | Status: DC

## 2020-03-28 NOTE — Progress Notes (Signed)
SATURATION QUALIFICATIONS: (This note is used to comply with regulatory documentation for home oxygen)  Patient Saturations on Room Air at Rest = not tested   Patient Saturations on Room Air while Ambulating = not tested   Patient Saturations on 4Liters of oxygen while Ambulating = 95  Please briefly explain why patient needs home oxygen:   patient ambulated  approximately 50 feet and said "that's enough" due fatigue. He requested non-rebreather upon return to the room for a few minutes then switched to 4 L [place]

## 2020-03-28 NOTE — Progress Notes (Addendum)
1800-1915 hrs Discharge instructions given and patient verbalized understanding.IV discontinued. Patient is to be picked up by a friend. Assisted to [facility] per request and had a small BM. Patient dressed himself for home. He c/o SOB and requested Non re breather, same provided.Patient then assited on to the wheel chair for a ride to the car.Patient continued to c/o SOB. Attempts to switch him back to [place] unsuccessful as patient stated "I cant breath".Patient also stated  "I can`t go home like this".   Reported off to night shift.

## 2020-03-28 NOTE — Progress Notes (Addendum)
Progress Note  Patient Name: [name] Date of Encounter: 03/28/2020  [facility] Cardiologist: Kirk Ruths, MD   Subjective  Patient is feeling better this morning. Continues to have dyspnea on exertion and cough.  Transitioned to lasix 60mg  PO; net negative 690 overnight. Cr stable  Inpatient Medications    Scheduled Meds: . aspirin EC  81 mg Oral Daily  . atorvastatin  40 mg Oral Daily  . buPROPion  150 mg Oral Daily  . diclofenac Sodium  4 g Topical Daily  . enoxaparin (LOVENOX) injection  40 mg Subcutaneous Q24H  . folic acid  1 mg Oral Daily  . furosemide  40 mg Oral Daily  . lidocaine  1 patch Transdermal Q24H  . metoprolol succinate  12.5 mg Oral Daily  . mometasone-formoterol  2 puff Inhalation BID  . sodium chloride flush  3 mL Intravenous Q12H  . umeclidinium bromide  1 puff Inhalation Daily   Continuous Infusions: . sodium chloride     PRN Meds: sodium chloride, acetaminophen, fentaNYL (SUBLIMAZE) injection, ipratropium, ondansetron (ZOFRAN) IV, prochlorperazine, sodium chloride flush   Vital Signs    Vitals:   03/27/20 2114 03/28/20 0000 03/28/20 0400 03/28/20 0828  BP:  100/70 96/65 107/68  Pulse:    98  Resp:  (!) 22 20 (!) 22  Temp:  98 F (36.7 C) 98.7 F (37.1 C) 98 F (36.7 C)  TempSrc:  Oral Oral Oral  SpO2: 100% 100% 100% 94%  Weight:   53.6 kg   Height:        Intake/Output Summary (Last 24 hours) at 03/28/2020 0923 Last data filed at 03/28/2020 0700 Gross per 24 hour  Intake 720 ml  Output 1550 ml  Net -830 ml   Last 3 Weights 03/28/2020 03/27/2020 03/26/2020  Weight (lbs) 118 lb 1.6 oz 119 lb 8 oz 119 lb 7.8 oz  Weight (kg) 53.57 kg 54.205 kg 54.2 kg      Telemetry    NSR with frequent PVCs - Personally Reviewed  ECG    No new ECG - Personally Reviewed  Physical Exam   GEN: Sitting up in bed, O2 in place, speaking in full sentences.   Neck: No JVD Cardiac: RRR, no murmurs, rubs, or gallops.  Respiratory:  Rhonchorous breath sounds at bases that partially clears with coughing. Expiratory wheezing. GI: Soft, nontender, non-distended  MS: Warm, no edema Neuro:  Nonfocal  Psych: Normal affect   Labs    High Sensitivity Troponin:   Recent Labs  Lab 03/18/20 0904 03/18/20 1131  TROPONINIHS 51* 63*      Chemistry Recent Labs  Lab 03/26/20 0229 03/27/20 0335 03/28/20 0552  NA 135 136 139  K 4.6 4.4 4.1  CL 97* 97* 97*  CO2 29 29 34*  GLUCOSE 112* 117* 102*  BUN 20 22* 20  CREATININE 0.99 0.91 0.91  CALCIUM 9.1 9.0 9.4  GFRNONAA >60 >60 >60  ANIONGAP 9 10 8      Hematology Recent Labs  Lab 03/26/20 0229  HGB 9.1*  HCT 30.0*    BNPNo results for input(s): BNP, PROBNP in the last 168 hours.   DDimer No results for input(s): DDIMER in the last 168 hours.   Radiology    No results found.  Cardiac Studies  ECHO 03/18/2020:  1. Left ventricular ejection fraction, by estimation, is <20%. The left  ventricle has severely decreased function. The left ventricle demonstrates  regional wall motion abnormalities (see scoring diagram/findings for  description). The  left ventricular  internal cavity size was moderately to severely dilated. There is mild  concentric left ventricular hypertrophy. Left ventricular diastolic  function could not be evaluated. There is diffuse severe hypokinesis with  akinesis of the distal to apical segments  of all walls.  2. Right ventricular systolic function was not well visualized. The right  ventricular size is not well visualized.  3. The mitral valve is normal in structure. Mild to moderate mitral valve  regurgitation.  4. The aortic valve is grossly normal. Aortic valve regurgitation is  mild. No aortic stenosis is present.   Comparison(s): Prior images unable to be directly viewed, comparison made  by report only.   Conclusion(s)/Recommendation(s): Severely reduced LVEF with both global  and focal wall motion abnormalities.      Patient Profile     59 y.o. male with CAD status post CABG, chronic systolic congestive heart failure secondary to ischemic cardiomyopathy s/p ICD, COPD, hypertension, hyperlipidemia, right lung adenocarcinoma, prostate adenocarcinoma, admitted with COPD exacerbation and acute on chronic systolic diastolic heart failure for which Cardiology has been consulted  Assessment & Plan  # Acute on chronic systolic and diastolic heart failure/Cardiomyopathy s/p ICD Diagnosed in 2011 with LVEF 20% s/p ICD. Baseline weight 50-53kg. Volume status improved with weight today 53.5kg. Transitioned to PO diuretic for maintenance. Renal function stable. -Continue lasix 40mg  PO daily; goal net evento negative -Continue low dose metoprolol -Unable to tolerate GDMT due to hypotension (did not tolerate ARB/Entresto due to hypotension) -Can consider hydral/isordil if persistent NYHA class III/IV symptoms if adequate BP room -Monitor I/Os and daily weights -Low Na diet  # CAD s/p UGQB(1694) hx STEMI 2010, 2011, PCI SVG to LAD:  # Hyperlipidemia:  No active chest pain. -Continue ASA -Continue atorvastatin 40mg  daily -Continue metop 12.5mg  daily -Unable to tolerate ACE/ARB due to hypotension  # Adenocarcinoma of prostate and R lungand recent anemia:  Chemotherapy with Dr. [name]. Chronic home 02  # COPD/hypoxia: -Management per primary team   For questions or updates, please contact [place] Please consult [email or web address] for contact info under        Signed, Freada Bergeron, MD  03/28/2020, 9:23 AM

## 2020-03-28 NOTE — Evaluation (Signed)
Physical Therapy Evaluation Patient Details Name: [name] MRN: [id number] DOB: 24-Sep-1960 Today's Date: 03/28/2020   History of Present Illness  [name] is a 59 y.o. male with medical history significant of coronary artery disease status post CABG, ischemic cardiomyopathy with ejection fraction of 20 to 25% status post ICD placement, chronic hypoxemic respiratory failure secondary to underlying COPD-on 4 L of oxygen via nasal cannulae at home, recurrent right lung cancer-currently getting systemic chemotherapy, chemotherapy-induced anemia status post recent PRBC transfusion, prostate cancer presents to emergency department with shortness of breath since 2 weeks.  Admitted with respiratory failure and CHF exacerbation.   Clinical Impression  Pt admitted with above diagnosis. Pt was able to pivot to chair with min guard assist to supervision.  Deferred walking as pt DOE on NRB mask was 3/4.  Pt most likely close to baseline regarding mobility but is requiring incr O2 needs at this time.  Feel that he will progress well and may benefit from [place] as well as possibly Meals on Wheels.  Rollator may help with pts endurance as well.  Pt currently with functional limitations due to the deficits listed below (see PT Problem List). Pt will benefit from skilled PT to increase their independence and safety with mobility to allow discharge to the venue listed below.      Follow Up Recommendations Home health PT;Supervision/Assistance - 24 hour, HHOT, Meals on Wheels    Equipment Recommendations  rollator with O2 tank holder   Recommendations for Other Services       Precautions / Restrictions Precautions Precautions: Fall Restrictions Weight Bearing Restrictions: No      Mobility  Bed Mobility Overal bed mobility: Independent                  Transfers Overall transfer level: Needs assistance Equipment used: None Transfers: Sit to/from [employer] Transfers Sit  to Stand: Supervision Stand pivot transfers: Supervision       General transfer comment: No assist needed.  Pt on NRB mask with VSS and O2 97-100%.  Did have DOE 3/4 just with transfer to chair therefore just did chair transfer since it was pts first time OOB in chair.   Ambulation/Gait                Stairs            Wheelchair Mobility    Modified Rankin (Stroke Patients Only)       Balance                                             Pertinent Vitals/Pain Pain Assessment: Faces Pain Score: 2  Faces Pain Scale: Hurts a little bit Pain Location: back Pain Descriptors / Indicators: Aching;Grimacing;Guarding Pain Intervention(s): Limited activity within patient's tolerance;Monitored during session;Repositioned    Home Living Family/patient expects to be discharged to:: Private residence Living Arrangements: Alone Available Help at Discharge: Family Type of Home: Apartment Home Access: Level entry     Home Layout: One level Home Equipment:  (home O2 3-4L)      Prior Function Level of Independence: Independent         Comments: cooking difficult for pt, does not drive     Hand Dominance   Dominant Hand: Right    Extremity/Trunk Assessment   Upper Extremity Assessment Upper Extremity Assessment: Defer to OT evaluation  Lower Extremity Assessment Lower Extremity Assessment: Generalized weakness    Cervical / Trunk Assessment Cervical / Trunk Assessment: Normal  Communication   Communication: No difficulties  Cognition Arousal/Alertness: Awake/alert Behavior During Therapy: WFL for tasks assessed/performed Overall Cognitive Status: Within Functional Limits for tasks assessed                                        General Comments General comments (skin integrity, edema, etc.): 107/68, 100% on NRB mask throughout, 105 bpm    Exercises General Exercises - Lower Extremity Ankle Circles/Pumps:  AROM;Both;10 reps;Supine Long Arc Quad: AROM;Both;10 reps;Seated   Assessment/Plan    PT Assessment Patient needs continued PT services  PT Problem List Decreased activity tolerance;Decreased balance;Decreased mobility;Decreased knowledge of use of DME;Decreased safety awareness;Decreased knowledge of precautions;Cardiopulmonary status limiting activity       PT Treatment Interventions DME instruction;Gait training;Functional mobility training;Therapeutic activities;Therapeutic exercise;Balance training;Patient/family education    PT Goals (Current goals can be found in the Care Plan section)  Acute Rehab PT Goals Patient Stated Goal: to go home PT Goal Formulation: With patient Time For Goal Achievement: 04/11/20 Potential to Achieve Goals: Good    Frequency Min 3X/week   Barriers to discharge Decreased caregiver support      Co-evaluation               AM-PAC PT "6 Clicks" Mobility  Outcome Measure Help needed turning from your back to your side while in a flat bed without using bedrails?: None Help needed moving from lying on your back to sitting on the side of a flat bed without using bedrails?: None Help needed moving to and from a bed to a chair (including a wheelchair)?: A Little Help needed standing up from a chair using your arms (e.g., wheelchair or bedside chair)?: A Little Help needed to walk in hospital room?: A Little Help needed climbing 3-5 steps with a railing? : A Little 6 Click Score: 20    End of Session Equipment Utilized During Treatment: Gait belt;Oxygen Activity Tolerance: Patient limited by fatigue Patient left: in chair;with call bell/phone within reach;with chair alarm set Nurse Communication: Mobility status PT Visit Diagnosis: Muscle weakness (generalized) (M62.81)    Time: 1040-1057 PT Time Calculation (min) (ACUTE ONLY): 17 min   Charges:   PT Evaluation $PT Eval Low Complexity: [place] W,PT Acute Rehabilitation  Services Pager:  [phone]  Office:  [place] 03/28/2020, 1:59 PM

## 2020-03-28 NOTE — TOC Initial Note (Addendum)
Transition of Care [facility]) - Initial/Assessment Note    Patient Details  Name: [name] MRN: [id number] Date of Birth: [birth year]  Transition of [facility]) CM/SW Contact:    Zenon Mayo, RN Phone Number: 03/28/2020, 4:08 PM  Clinical Narrative:                 NCM spoke with patient, he states he will need [facility] services.  NCM offered choice, he has no preference, NCM made referral to [place] for [place], [place], [place], spoke with Delma Post [phone].  She states she can take the referral but she will still have to call it in on Monday to get authorization.  She states to fax information to her at [phone].  Patient will need rollator also, NCM made referral to [employer] for rollator, this will be brought up to patient's room.  Patient states he will need transportation back to [place],  [place] informed him will need ambulance transport, then he states he does not have the key to his oxygen at home, his friend Daphene Calamity has the key and he has not been able to get in touch with him.  NCM informed MD of this information so he may have to stay til tomorrow until he get the oxygen key. MD states patient will need outpatient palliative services. NCM offered choice, he states [facility] for him. NCM called [facility] [phone] awaiting call back. Patient states he did get in touch with his friend Herbie Baltimore and he will be picking him up today and transporting him home and Herbie Baltimore will be bringing his oxygen as well.   Expected Discharge Plan: [place] Barriers to Discharge:  (does not have his key to his oxygen, friend has the key)   Patient Goals and CMS Choice   CMS Medicare.gov Compare Post Acute Care list provided to:: Patient Choice offered to / list presented to : Patient  Expected Discharge Plan and Services Expected Discharge Plan: [place]   Discharge Planning Services: CM Consult Post  Acute Care Choice: Home Health, Durable Medical Equipment Living arrangements for the past 2 months: Apartment                 DME Arranged: Walker rolling with seat DME Agency: [employer] Date DME Agency Contacted: 03/28/20 Time DME Agency Contacted: [phone] Representative spoke with at DME Agency: [place] Arranged: RN, PT, OT HH Agency: [facility] Date [place]: 03/28/20 Time [place]: 1608 Representative spoke with at [place]: Delma Post  Prior Living Arrangements/Services Living arrangements for the past 2 months: Apartment Lives with:: Self Patient language and need for interpreter reviewed:: Yes Do you feel safe going back to the place where you live?: Yes      Need for Family Participation in Patient Care: No (Comment) Care giver support system in place?: No (comment)   Criminal Activity/Legal Involvement Pertinent to Current Situation/Hospitalization: No - Comment as needed  Activities of Daily Living Home Assistive Devices/Equipment: None ADL Screening (condition at time of admission) Patient's cognitive ability adequate to safely complete daily activities?: Yes Is the patient deaf or have difficulty hearing?: No Does the patient have difficulty seeing, even when wearing glasses/contacts?: No Does the patient have difficulty concentrating, remembering, or making decisions?: No Patient able to express need for assistance with ADLs?: Yes Does the patient have difficulty dressing or bathing?: No  Independently performs ADLs?: Yes (appropriate for developmental age) Does the patient have difficulty walking or climbing stairs?: Yes Weakness of Legs: Both Weakness of Arms/Hands: Right  Permission Sought/Granted                  Emotional Assessment Appearance:: Appears stated age Attitude/Demeanor/Rapport: Engaged Affect (typically observed): Appropriate Orientation: : Oriented to Self, Oriented to Place, Oriented to  Time, Oriented to  Situation Alcohol / Substance Use: Not Applicable Psych Involvement: No (comment)  Admission diagnosis:  SOB (shortness of breath) [R06.02] Acute on chronic combined systolic (congestive) and diastolic (congestive) heart failure (HCC) [I50.43] Patient Active Problem List   Diagnosis Date Noted  . Chronic right-sided low back pain without sciatica   . Palliative care encounter   . Acute on chronic combined systolic (congestive) and diastolic (congestive) heart failure ([place]) 03/18/2020  . Port-A-Cath in place 11/14/2019  . Adenocarcinoma of right lung, stage 4 ([place]) 03/07/2019  . Encounter for antineoplastic chemotherapy 03/07/2019  . Encounter for antineoplastic immunotherapy 03/07/2019  . Goals of care, counseling/discussion 02/07/2019  . Automatic implantable cardioverter-defibrillator in situ   . Malignant neoplasm of prostate ([place]) 07/04/2018  . AKI (acute kidney injury) ([place]) 10/24/2017  . Drug-induced hypotension 10/24/2017  . COPD exacerbation ([place])   . Acute on chronic combined systolic and diastolic CHF (congestive heart failure) ([place]) 03/23/2017  . Acute on chronic respiratory failure with hypoxia ([place]) 03/23/2017  . COPD (chronic obstructive pulmonary disease) ([place]) 11/13/2015  . Lung cancer ([place]) 11/13/2015  . Hypokalemia 06/16/2015  . CAP (community acquired pneumonia) 06/15/2015  . Pulmonary emphysema ([place]) 05/15/2015  . Rhinorrhea 05/15/2015  . Peroneal neuropathy 09/15/2014  . Foot drop, right 09/15/2014  . Other emphysema ([place]) 03/20/2014  . Right lower lobe lung mass 08/07/2013  . Preop cardiovascular exam 07/29/2013  . Implantable cardiac defibrillator-St Jude 07/11/2013  . Fibrotic left lung with volume loss due to old MTb at age 6 11/18/2011  . Adenocarcinoma of lung, stage 1, 64mm RLL paraspinal mass 11/18/2011  . Cardiomyopathy, ischemic 11/02/2011  . Emphysema lung ([place]) 11/01/2011  . Necrotizing pneumonia ([place]) 11/01/2011  . Skin ulcer ([place]) 11/01/2011   . Hx of tuberculosis 11/01/2011  . Smoker 10/28/2011  . Microcytic anemia 10/27/2011  . Chest pain 08/12/2011  . Coronary artery disease 08/12/2011  . Hyperlipidemia 08/12/2011   PCP:  [name], MD Pharmacy:   [facility] [address], [place] [address] Phone: [phone] Fax: [phone]     Social Determinants of Health (SDOH) Interventions Food Insecurity Interventions: Intervention Not Indicated Financial Strain Interventions: Intervention Not Indicated Transportation Interventions: [facility]  Readmission Risk Interventions Readmission Risk Prevention Plan 03/28/2020  Transportation Screening Complete  HRI or Home Care Consult Complete  Social Work Consult for [place] Planning/Counseling Complete  Palliative Care Screening Not Applicable  Medication Review Press photographer) Complete  Some recent data might be hidden

## 2020-03-28 NOTE — Progress Notes (Signed)
MD paged about patient being [facility], crackles heard in left lung field. IV lasix x 1 given.

## 2020-03-28 NOTE — Discharge Summary (Signed)
Physician Discharge Summary  [name] YPP:[id number] DOB: [birth year] DOA: 03/18/2020  PCP: [name], MD  Admit date: 03/18/2020 Discharge date: 03/28/2020  Admitted From: home Disposition:  Home with palliative care and home health   Recommendations for Outpatient Follow-up:  1. Please f/u K+ level in a week 2. Continue discussions regarding palliative care  Home Health:  ordered  Discharge Condition:  stable   CODE STATUS:  DNI- do not intubate   Diet recommendation:  Heart healthy Consultations:  cardiology  Procedures/Studies: .  none   Discharge Diagnoses:  Principal Problem:   Acute on chronic combined systolic and diastolic CHF (congestive heart failure) ([place]) Active Problems:   Acute on chronic respiratory failure with hypoxia    Coronary artery disease   Hyperlipidemia   Cardiomyopathy, ischemic   Right lower lobe lung mass   COPD (chronic obstructive pulmonary disease) ([place])   Drug-induced hypotension   Palliative care encounter   Chronic right-sided low back pain without sciatica     Brief Summary: HPI on 03/18/2020 by Dr. [name] a 59 y.o.malewith medical history significant ofcoronary artery disease status postCABG, ischemic cardiomyopathy with ejection fraction of 20 to 25% status post ICD placement, chronic hypoxemic respiratory failure secondary to underlying COPD-on 4 L of oxygen via nasal cannulae at home, recurrent right lung cancer-currently getting systemic chemotherapy, chemotherapy-induced anemia status post recent PRBC transfusion, prostate cancerpresents to emergency department with shortness of breath since 2 weeks.  Patient tells me that that he has shortness of breath since 2 weeks, which is getting worse, associated with leg swelling, orthopnea, PND and weight gain of 20 pounds in last 2 months. Denies chest pain, fever, chills, cough, congestion, nausea, vomiting, abdominal pain, urinary or bowel  changes. He is compliant with his home medication however he eats high sodium diet.He cooks for himself and eatsbacon/red meat. "I like salt in my food".  He is on 4 L of oxygen via nasal cannulae at home. Denies current cigarette smoking, alcohol, illicit drug use. Has history of non-small cell lung cancer diagnosed in February 2015, Recurrent lung cancer diagnosed in September 2020 currently getting chemotherapy every 3 weeks. Followed by oncology Dr. [name].Prostate adenocarcinomadiagnosed in November 2019-followed by Dr. [name].He was transfused PRBC yesterday due to chemotherapy-induced anemia. He is fully vaccinated against COVID-19.  He tells me that he admitted at [facility] for pneumonia and received antibiotics.  Hospital Course:  Acute on chronic hypoxic respiratory failure secondary to acute on chronic combined systolic and CHF exacerbation/ischemic cardiomyopathy - initially requiring BiPAP - EF 20-25% with AICD - He has been diuresed in the hospital and cardiology recommends stopping IV diuretics- he has been switched to 40 mg Lasix daily- K+ is on high end and he has not required K+ replacement in the hospital with the lasix- thus, I will not order any  - weight 134> 118.1 (per documentation in Epic) - cardiology does not feel he is a candidate for further advance CHF treatments - at baseline he requires 4-5 L O2 and he is at baseline today  Severe deconditioning/ FTT - barely can walk 50 ft without becoming short of breath- see below- not a candidate for further chemo thus palliative care consulted- he has decided on DNI but other wise wants aggressive measures- he will d/c home with PT and an outpatient palliative care consult  Non-small cell lung cancer diagnosed in February 2015 - treated by [name] with chemo who on 10/18 documented that his  performance status was such that he would not be giving further chemo - he plans to f/u in 1  month  Prostate CA- diagnosed 03/2018 - s/p seed implants -   COPD - appears stable/ no wheezing noted  Depression/ anxiety - cont Wellbutrin  GOC - he is now a DNI- he has declined hospice care  - we will d/c him with palliative care and home health PT as recommended by PT   Discharge Exam: Vitals:   03/28/20 0828 03/28/20 1329  BP: 107/68 106/71  Pulse: 98   Resp: (!) 22 (!) 25  Temp: 98 F (36.7 C)   SpO2: 94% 100%   Vitals:   03/28/20 0000 03/28/20 0400 03/28/20 0828 03/28/20 1329  BP: 100/70 96/65 107/68 106/71  Pulse:   98   Resp: (!) 22 20 (!) 22 (!) 25  Temp: 98 F (36.7 C) 98.7 F (37.1 C) 98 F (36.7 C)   TempSrc: Oral Oral Oral   SpO2: 100% 100% 94% 100%  Weight:  53.6 kg    Height:        General: Pt is alert, awake, not in acute distress Cardiovascular: RRR, S1/S2 +, no rubs, no gallops Respiratory: CTA bilaterally, no wheezing, no rhonchi Abdominal: Soft, NT, ND, bowel sounds + Extremities: no edema, no cyanosis   Discharge Instructions  Discharge Instructions    (HEART FAILURE PATIENTS) Call MD:  Anytime you have any of the following symptoms: 1) 3 pound weight gain in 24 hours or 5 pounds in 1 week 2) shortness of breath, with or without a dry hacking cough 3) swelling in the hands, feet or stomach 4) if you have to sleep on extra pillows at night in order to breathe.   Complete by: As directed    Amb Referral to Palliative Care   Complete by: As directed    Diet - low sodium heart healthy   Complete by: As directed    Increase activity slowly   Complete by: As directed      Allergies as of 03/28/2020   No Known Allergies     Medication List    STOP taking these medications   carvedilol 3.125 MG tablet Commonly known as: COREG   predniSONE 10 MG tablet Commonly known as: DELTASONE     TAKE these medications   acetaminophen 325 MG tablet Commonly known as: TYLENOL Take 2 tablets (650 mg total) by mouth every 4 (four) hours  as needed for headache or mild pain.   aspirin EC 81 MG tablet Take 81 mg by mouth daily.   atorvastatin 40 MG tablet Commonly known as: LIPITOR Take 40 mg by mouth daily.   buPROPion 150 MG 12 hr tablet Commonly known as: WELLBUTRIN SR Take 150 mg by mouth daily.   diclofenac Sodium 1 % Gel Commonly known as: VOLTAREN Apply 4 g topically daily. Start taking on: March 29, 2020   Entresto 24-26 MG Generic drug: sacubitril-valsartan Take 1 tablet by mouth 2 (two) times daily.   Fluticasone-Salmeterol 250-50 MCG/DOSE Aepb Commonly known as: Advair Diskus Inhale 1 puff into the lungs 2 (two) times daily.   folic acid 1 MG tablet Commonly known as: FOLVITE Take 1 tablet (1 mg total) by mouth daily.   furosemide 40 MG tablet Commonly known as: LASIX Take 1 tablet (40 mg total) by mouth daily. Start taking on: March 29, 2020   ipratropium 0.02 % nebulizer solution Commonly known as: ATROVENT Take 3 mLs by nebulization every 6 (six)  hours as needed for wheezing or shortness of breath.   lidocaine 5 % Commonly known as: LIDODERM Place 1 patch onto the skin daily. Remove & Discard patch within 12 hours or as directed by MD Start taking on: March 29, 2020   lidocaine-prilocaine cream Commonly known as: EMLA Apply 1 application topically as needed.   metoprolol succinate 25 MG 24 hr tablet Commonly known as: TOPROL-XL Take 0.5 tablets (12.5 mg total) by mouth in the morning and at bedtime.   potassium chloride SA 20 MEQ tablet Commonly known as: KLOR-CON Take 1 tablet (20 mEq total) by mouth daily. What changed: Another medication with the same name was removed. Continue taking this medication, and follow the directions you see here.   prochlorperazine 10 MG tablet Commonly known as: COMPAZINE TAKE 1 TABLET BY MOUTH EVERY 6 HOURS AS NEEDED FOR NAUSEA FOR VOMITING What changed: See the new instructions.   Spiriva HandiHaler 18 MCG inhalation capsule Generic  drug: tiotropium INHALE THE CONTENTS OF 1 CAPSULE EVERY DAY What changed:   how much to take  how to take this  when to take this  additional instructions            Durable Medical Equipment  (From admission, onward)         Start     Ordered   03/28/20 1555  For home use only DME 4 wheeled rolling walker with seat  Once       Comments: With oxygen tank holder  Question:  Patient needs a walker to treat with the following condition  Answer:  Weakness   03/28/20 1559          Follow-up Information    [facility] [place] Follow up.   Why: HHRN,HHPT, HHOT Contact information: [address] [place] [address] [place], [place] [facility] Follow up.   Why: rollator Contact information: [address] [place] [address] [phone]              No Known Allergies    DG Chest Portable 1 View  Result Date: 03/18/2020 CLINICAL DATA:  Shortness of breath, lung cancer.  Swelling in legs. EXAM: PORTABLE CHEST 1 VIEW COMPARISON:  Chest radiograph 03/05/2019.  Chest CT 01/06/2020 FINDINGS: Similar opacification left lung apex with interspersed lucencies, characterized as bronchiectasis, chronic lung disease, and probable large aspirate alone a on prior CT. Right lower lobe mass was better characterized on recent CT. There is new mild diffuse interstitial prominence, suggestive of mild interstitial edema. Pulmonary vascular congestion. Suspected small bilateral pleural effusions. No discernible pneumothorax. Right Port-A-Cath with the tip projecting at the cavoatrial junction. Left subclavian approach cardiac rhythm maintenance device. IMPRESSION: 1. New mild diffuse interstitial prominence, pulmonary vascular congestion, and suspected small bilateral pleural effusions, possibly related to mild pulmonary edema. Atypical pneumonia is a consideration. 2. Right lower lobe mass was better characterized on recent CT. 3. Similar chronic changes in  the left lung apex, as detailed above. Electronically Signed   By: Margaretha Sheffield MD   On: 03/18/2020 09:38   ECHOCARDIOGRAM COMPLETE  Result Date: 03/18/2020    ECHOCARDIOGRAM REPORT   Patient Name:   [facility] Date of Exam: 03/18/2020 Medical Rec #:  [id number]     Height:       68.0 in Accession #:    [id number]    Weight:       140.5 lb Date of Birth:  10-18-1960  BSA:          1.759 m Patient Age:    89 years      BP:           132/76 mmHg Patient Gender: M             HR:           115 bpm. Exam Location:  Inpatient Procedure: 2D Echo Indications:    CHF 428  History:        Patient has prior history of Echocardiogram examinations, most                 recent 03/01/2018. CHF and Cardiomyopathy, CAD and Previous                 Myocardial Infarction, Defibrillator and Prior CABG, COPD; Risk                 Factors:Dyslipidemia and Former Smoker. AICD. STEMI.  Sonographer:    Jannett Celestine RDCS (AE) Referring Phys: [id number] Michell Heinrich PAHWANI IMPRESSIONS  1. Left ventricular ejection fraction, by estimation, is <20%. The left ventricle has severely decreased function. The left ventricle demonstrates regional wall motion abnormalities (see scoring diagram/findings for description). The left ventricular internal cavity size was moderately to severely dilated. There is mild concentric left ventricular hypertrophy. Left ventricular diastolic function could not be evaluated. There is diffuse severe hypokinesis with akinesis of the distal to apical segments  of all walls.  2. Right ventricular systolic function was not well visualized. The right ventricular size is not well visualized.  3. The mitral valve is normal in structure. Mild to moderate mitral valve regurgitation.  4. The aortic valve is grossly normal. Aortic valve regurgitation is mild. No aortic stenosis is present. Comparison(s): Prior images unable to be directly viewed, comparison made by report only. Conclusion(s)/Recommendation(s): Severely  reduced LVEF with both global and focal wall motion abnormalities. FINDINGS  Left Ventricle: LV apical thrombus excluded by contrast. Left ventricular ejection fraction, by estimation, is <20%. The left ventricle has severely decreased function. The left ventricle demonstrates regional wall motion abnormalities. The left ventricular internal cavity size was moderately to severely dilated. There is mild concentric left ventricular hypertrophy. Left ventricular diastolic function could not be evaluated.  LV Wall Scoring: There is diffuse severe hypokinesis with akinesis of the distal to apical segments of all walls. Right Ventricle: The right ventricular size is not well visualized. Right vetricular wall thickness was not assessed. Right ventricular systolic function was not well visualized. Left Atrium: Left atrial size was not assessed. Right Atrium: Right atrial size was not assessed. Pericardium: There is no evidence of pericardial effusion. Mitral Valve: The mitral valve is normal in structure. Mild to moderate mitral valve regurgitation. Tricuspid Valve: The tricuspid valve is normal in structure. Tricuspid valve regurgitation is trivial. No evidence of tricuspid stenosis. Aortic Valve: The aortic valve is grossly normal. Aortic valve regurgitation is mild. No aortic stenosis is present. Pulmonic Valve: The pulmonic valve was grossly normal. Pulmonic valve regurgitation is not visualized. Aorta: The aortic root is normal in size and structure. Venous: IVC assessment for right atrial pressure unable to be performed due to mechanical ventilation. IAS/Shunts: The interatrial septum was not assessed. Additional Comments: A pacer wire is visualized in the right atrium and right ventricle.  LEFT VENTRICLE PLAX 2D LVIDd:         6.20 cm LVIDs:         5.20 cm LV PW:  1.20 cm LV IVS:        1.10 cm LVOT diam:     2.10 cm LV SV:         30 LV SV Index:   17 LVOT Area:     3.46 cm  LEFT ATRIUM         Index LA  diam:    3.80 cm 2.16 cm/m  AORTIC VALVE LVOT Vmax:   59.70 cm/s LVOT Vmean:  44.200 cm/s LVOT VTI:    0.087 m  AORTA Ao Root diam: 3.30 cm MITRAL VALVE MV Area (PHT): 4.68 cm    SHUNTS MV Decel Time: 162 msec    Systemic VTI:  0.09 m MR Peak grad: 67.2 mmHg    Systemic Diam: 2.10 cm MR Mean grad: 41.0 mmHg MR Vmax:      410.00 cm/s MR Vmean:     300.0 cm/s MV E velocity: 85.10 cm/s MV A velocity: 60.60 cm/s MV E/A ratio:  1.40 Buford Dresser MD Electronically signed by Buford Dresser MD Signature Date/Time: 03/18/2020/8:41:25 PM    Final    CUP PACEART REMOTE DEVICE CHECK  Result Date: 03/05/2020 Scheduled remote reviewed. Normal device function.  1 NSVT episode Next remote 91 days.    The results of significant diagnostics from this hospitalization (including imaging, microbiology, ancillary and laboratory) are listed below for reference.     Microbiology: No results found for this or any previous visit (from the past 240 hour(s)).   Labs: BNP (last 3 results) Recent Labs    03/18/20 0904  BNP 1,497.0*   Basic Metabolic Panel: Recent Labs  Lab 03/23/20 1046 03/25/20 0515 03/26/20 0229 03/27/20 0335 03/28/20 0552  NA 138 137 135 136 139  K 4.5 4.8 4.6 4.4 4.1  CL 95* 98 97* 97* 97*  CO2 31 28 29 29  34*  GLUCOSE 105* 105* 112* 117* 102*  BUN 21* 18 20 22* 20  CREATININE 1.06 0.89 0.99 0.91 0.91  CALCIUM 9.2 9.3 9.1 9.0 9.4   Liver Function Tests: No results for input(s): AST, ALT, ALKPHOS, BILITOT, PROT, ALBUMIN in the last 168 hours. No results for input(s): LIPASE, AMYLASE in the last 168 hours. No results for input(s): AMMONIA in the last 168 hours. CBC: Recent Labs  Lab 03/26/20 0229  HGB 9.1*  HCT 30.0*   Cardiac Enzymes: No results for input(s): CKTOTAL, CKMB, CKMBINDEX, TROPONINI in the last 168 hours. BNP: Invalid input(s): POCBNP CBG: Recent Labs  Lab 03/22/20 1556  GLUCAP 79   D-Dimer No results for input(s): DDIMER in the last 72  hours. Hgb A1c No results for input(s): HGBA1C in the last 72 hours. Lipid Profile No results for input(s): CHOL, HDL, LDLCALC, TRIG, CHOLHDL, LDLDIRECT in the last 72 hours. Thyroid function studies No results for input(s): TSH, T4TOTAL, T3FREE, THYROIDAB in the last 72 hours.  Invalid input(s): FREET3 Anemia work up No results for input(s): VITAMINB12, FOLATE, FERRITIN, TIBC, IRON, RETICCTPCT in the last 72 hours. Urinalysis    Component Value Date/Time   COLORURINE STRAW (A) 12/12/2018 1927   APPEARANCEUR CLEAR 12/12/2018 1927   LABSPEC 1.011 12/12/2018 1927   PHURINE 5.0 12/12/2018 1927   GLUCOSEU NEGATIVE 12/12/2018 1927   HGBUR NEGATIVE 12/12/2018 1927   BILIRUBINUR NEGATIVE 12/12/2018 [place] NEGATIVE 12/12/2018 1927   PROTEINUR NEGATIVE 12/12/2018 1927   UROBILINOGEN 0.2 08/06/2013 1436   NITRITE NEGATIVE 12/12/2018 1927   LEUKOCYTESUR NEGATIVE 12/12/2018 1927   Sepsis Labs Invalid input(s): PROCALCITONIN,  WBC,  [place] [facility] No results found for this or any previous visit (from the past 240 hour(s)).   Time coordinating discharge in minutes: 60  SIGNED:   Debbe Odea, MD  [facility] 03/28/2020, 4:46 PM

## 2020-03-28 NOTE — Plan of Care (Signed)
  Problem: Activity: Goal: Risk for activity intolerance will decrease Outcome: Progressing   

## 2020-03-28 NOTE — Progress Notes (Signed)
This RN informed by off going RN pt experienced respiratory distress during transfer to w/c to d/c home. Pt on 4LNC during the above episode. Pt currently on NRB sitting in recliner. Pt verbalizes feeling depressed about activity intolerance with 4LNC. Allowed pt to express concerns/feelings. Provided reassurance. On call hospitalist notified and order to d/c home is now cancelled. Call light in reach. Will con't to monitor.

## 2020-03-29 ENCOUNTER — Inpatient Hospital Stay (HOSPITAL_COMMUNITY): Payer: Medicare HMO

## 2020-03-29 DIAGNOSIS — I5043 Acute on chronic combined systolic (congestive) and diastolic (congestive) heart failure: Secondary | ICD-10-CM | POA: Diagnosis not present

## 2020-03-29 DIAGNOSIS — J438 Other emphysema: Secondary | ICD-10-CM | POA: Diagnosis not present

## 2020-03-29 DIAGNOSIS — J9621 Acute and chronic respiratory failure with hypoxia: Secondary | ICD-10-CM | POA: Diagnosis not present

## 2020-03-29 DIAGNOSIS — Z66 Do not resuscitate: Secondary | ICD-10-CM

## 2020-03-29 LAB — BRAIN NATRIURETIC PEPTIDE: B Natriuretic Peptide: 1862.9 pg/mL — ABNORMAL HIGH (ref 0.0–100.0)

## 2020-03-29 MED ORDER — LORAZEPAM 0.5 MG PO TABS
0.2500 mg | ORAL_TABLET | Freq: Two times a day (BID) | ORAL | Status: DC
  Administered 2020-03-29 – 2020-03-30 (×2): 0.25 mg via ORAL
  Filled 2020-03-29 (×2): qty 1

## 2020-03-29 MED ORDER — LORAZEPAM 0.5 MG PO TABS: 0.2500 mg | ORAL_TABLET | Freq: Four times a day (QID) | ORAL | Status: DC | PRN

## 2020-03-29 MED ORDER — FUROSEMIDE 10 MG/ML IJ SOLN
60.0000 mg | Freq: Once | INTRAMUSCULAR | Status: AC
  Administered 2020-03-29: 60 mg via INTRAVENOUS
  Filled 2020-03-29: qty 6

## 2020-03-29 NOTE — Progress Notes (Signed)
Daily Progress Note   Patient Name: [name]       Date: 03/29/2020 DOB: [birth year]  Age: 59 y.o. MRN#: [id number] Attending Physician: Debbe Odea, MD Primary Care Physician: [name], MD Admit Date: 03/18/2020  Reason for Consultation/Follow-up:   To discuss complex medical decision making related to patient's goals of care  Subjective: Palliative family meeting held at bedside with patient.  His mother, brother, sister and daughter were in attendance.  Family is very kind and appreciative.  With Cyle's permission I explained his medical situation: End-stage heart failure with shortness of breath at rest, prostate cancer, recurrent lung cancer, unable to tolerate chemotherapy at this point.  Insurance denied SNF placement for 24-hour care.  Patient is eligible for comprehensive support from hospice in a family member's home.  Given his dyspnea living alone is not a safe plan for him.  Family is very supportive.  They are also experienced with hospice services in their home.  They are discussing what should family member he should stay with.  Patient does not feel he is ready for hospice facility placement, but appears willing to accept hospice services in a family member's home.  Patient and family are hopeful for improvement in his breathing with ongoing treatment/support given chest x-ray findings today.  Daughter brought up the insightful point that the possible pneumonia on chest x-ray could be caused by cancer.  Assessment: Patient severely dyspneic with end-stage heart failure and new lung opacities on chest x-ray.  Question if this could be worsening of his lung cancer.   Patient Profile/HPI:  59 y.o.malewith past medical history of CAD s/p CABG, CHF with EF < 20%  ICD in place, COPD on 4L, lung cancer in 2015 and again in 2019 he has been on palliative chemo until his recent visit with Dr. [name] who recommended pausing chemo due to intolerance, and prostate cancer diagnosed in 11/2019who was admitted on 10/20/2021with volume overload and decompensated heart failure. He is feeling better with aggressive diuresis. PMT was asked to see for goals of care.    Length of Stay: 11   Vital Signs: BP 91/71   Pulse 96   Temp 98.5 F (36.9 C) (Oral)   Resp (!) 21   Ht 5' 7.99" (1.727 m)   Wt 53.6 kg   SpO2 100%  BMI 17.96 kg/m  SpO2: SpO2: 100 % O2 Device: O2 Device: NRB O2 Flow Rate: O2 Flow Rate (L/min): 15 L/min       Palliative Assessment/Data: 30-40%     Palliative Care Plan    Recommendations/Plan:  CODE STATUS changed to DNR.  MOST form completed.  Patient opted for DNR, limited measures, antibiotics if needed, NO IV fluids, NO feeding tube.  Discussed home with hospice service.  Family considering options.  Added very low-dose Ativan twice daily and as needed to help relieve anxiety  Will also add very low-dose morphine as needed for dyspnea  Once patient's dyspnea improves perhaps PT reevaluation would recommend SNF if family is unable to take him home.  Code Status:  DNR  Prognosis:   < 3 months end-stage heart failure with dyspnea at rest, prostate cancer, recurrent lung cancer, to deconditioned and frail to tolerate chemotherapy.  Discharge Planning:  To Be Determined.  Considering home with hospice services  Care plan was discussed with Dr. Wynelle Cleveland, case management, social work, RN, family  Thank you for allowing the Palliative Medicine Team to assist in the care of this patient.  Total time spent: 35 minutes     Greater than 50%  of this time was spent counseling and coordinating care related to the above assessment and plan.  Florentina Jenny, PA-C Palliative Medicine  Please contact Palliative  MedicineTeam phone at [phone] for questions and concerns between 7 am - 7 pm.   Please see AMION for individual provider pager numbers.

## 2020-03-29 NOTE — Progress Notes (Signed)
Progress Note  Patient Name: [name] Date of Encounter: 03/29/2020  [facility] Cardiologist: Kirk Ruths, MD   Subjective   Patient was planned to go home yesterday but became more SOB requiring NRB. Remained on NRB overnight and continues to feel SOB when it is removed this morning.  CXR with new patchy opacity in the right mid to lower lung superimposed on chronic interstitial thickening concerning for possible PNA.   Inpatient Medications    Scheduled Meds: . aspirin EC  81 mg Oral Daily  . atorvastatin  40 mg Oral Daily  . buPROPion  150 mg Oral Daily  . Chlorhexidine Gluconate Cloth  6 each Topical Daily  . diclofenac Sodium  4 g Topical Daily  . enoxaparin (LOVENOX) injection  40 mg Subcutaneous Q24H  . folic acid  1 mg Oral Daily  . furosemide  40 mg Oral Daily  . lidocaine  1 patch Transdermal Q24H  . metoprolol succinate  12.5 mg Oral Daily  . mometasone-formoterol  2 puff Inhalation BID  . sodium chloride flush  10-40 mL Intracatheter Q12H  . sodium chloride flush  3 mL Intravenous Q12H  . umeclidinium bromide  1 puff Inhalation Daily   Continuous Infusions: . sodium chloride     PRN Meds: sodium chloride, acetaminophen, fentaNYL (SUBLIMAZE) injection, ipratropium, ondansetron (ZOFRAN) IV, prochlorperazine, sodium chloride flush, sodium chloride flush   Vital Signs    Vitals:   03/28/20 1943 03/28/20 2125 03/29/20 0355 03/29/20 0740  BP: 111/77 106/71 91/71   Pulse: (!) 112 (!) 106 96   Resp: 18 (!) 22 18   Temp: 98.5 F (36.9 C) 98.5 F (36.9 C) 98.2 F (36.8 C)   TempSrc: Oral Oral Oral   SpO2: 100% 100% 100% 100%  Weight:      Height:        Intake/Output Summary (Last 24 hours) at 03/29/2020 0813 Last data filed at 03/29/2020 0755 Gross per 24 hour  Intake 715 ml  Output 1100 ml  Net -385 ml   Last 3 Weights 03/29/2020 03/28/2020 03/27/2020  Weight (lbs) (No Data) 118 lb 1.6 oz 119 lb 8 oz  Weight (kg) (No Data) 53.57 kg  54.205 kg      Telemetry    SR/sinus tach with HR 90-110s; occasional PVCs - Personally Reviewed  ECG    No new ECGs - Personally Reviewed  Physical Exam   GEN: Sitting up in the chair with NRB in place; SOB with talking. Neck: No JVD Cardiac: Tachycardic, regular, no murmurs  Respiratory: Diminished at bases. Expiratory wheezing noted in LUL.  GI: Soft, nontender, non-distended  MS: Warm, no edema Neuro:  Nonfocal  Psych: Normal affect   Labs    High Sensitivity Troponin:   Recent Labs  Lab 03/18/20 0904 03/18/20 1131  TROPONINIHS 51* 63*      Chemistry Recent Labs  Lab 03/26/20 0229 03/27/20 0335 03/28/20 0552  NA 135 136 139  K 4.6 4.4 4.1  CL 97* 97* 97*  CO2 29 29 34*  GLUCOSE 112* 117* 102*  BUN 20 22* 20  CREATININE 0.99 0.91 0.91  CALCIUM 9.1 9.0 9.4  GFRNONAA >60 >60 >60  ANIONGAP 9 10 8      Hematology Recent Labs  Lab 03/26/20 0229  HGB 9.1*  HCT 30.0*    BNPNo results for input(s): BNP, PROBNP in the last 168 hours.   DDimer No results for input(s): DDIMER in the last 168 hours.   Radiology  No results found.  Cardiac Studies  ECHO 03/18/2020:  1. Left ventricular ejection fraction, by estimation, is <20%. The left  ventricle has severely decreased function. The left ventricle demonstrates  regional wall motion abnormalities (see scoring diagram/findings for  description). The left ventricular  internal cavity size was moderately to severely dilated. There is mild  concentric left ventricular hypertrophy. Left ventricular diastolic  function could not be evaluated. There is diffuse severe hypokinesis with  akinesis of the distal to apical segments  of all walls.  2. Right ventricular systolic function was not well visualized. The right  ventricular size is not well visualized.  3. The mitral valve is normal in structure. Mild to moderate mitral valve  regurgitation.  4. The aortic valve is grossly normal. Aortic  valve regurgitation is  mild. No aortic stenosis is present.   Comparison(s): Prior images unable to be directly viewed, comparison made  by report only.   Conclusion(s)/Recommendation(s): Severely reduced LVEF with both global  and focal wall motion abnormalities.   Patient Profile     59 y.o. male with CAD status post CABG, chronic systolic congestive heart failure secondary to ischemic cardiomyopathy s/p ICD, COPD, hypertension, hyperlipidemia, right lung adenocarcinoma, prostate adenocarcinoma, admitted with COPD exacerbation and acute on chronic systolic diastolic heart failure for which Cardiology has been consulted.  Assessment & Plan  # Acute on chronic systolic and diastolic heart failure/Cardiomyopathy s/p ICD Diagnosed in 2011 with LVEF 20% s/p ICD. Baseline weight 50-53kg. Volume status improved and transitioned to lasix PO for maintenance. Renal function stable. Do not think patient's worsening respiratory status is secondary to volume but likely underlying COPD/PNA. -Suspect worsening respiratory status due to COPD/?PNA and not due to volume -Will repeat BNP; no edema on JVD on exam -Continue lasix 40mg  PO daily as maintenance diuretic -Continue low dose metoprolol -Unable to tolerate GDMT due to hypotension (did not tolerate ARB/Entresto due to hypotension) -Can consider hydral/isordil if persistent NYHA class III/IV symptoms if adequate BP room -Monitor I/Os and daily weights -Low Na diet  # CAD s/p WYOV(7858) hx STEMI 2010, 2011, PCI SVG to LAD:  # Hyperlipidemia:  No active chest pain. -Continue ASA -Continue atorvastatin 40mg  daily -Continue metop 12.5mg  daily -Unable to tolerate ACE/ARB due to hypotension  # Adenocarcinoma of prostate and R lungand recent anemia:  Chemotherapy with Dr. [name]. Chronic home 02  # COPD/hypoxia/?PNA: Requiring NRB. Do not think that his respiratory status is due to pulmonary edema at this time. Agree with continued  management of COPD/?PNA. -Management per primary team -Continue PO lasix; will repeat BNP now -Palliative care consulted per primary team  For questions or updates, please contact [place] Please consult [email or web address] for contact info under        Signed, Freada Bergeron, MD  03/29/2020, 8:13 AM

## 2020-03-29 NOTE — Progress Notes (Signed)
PROGRESS NOTE    [name]   ELF:[id number]  DOB: 1960/12/09  DOA: 03/18/2020 PCP: [name], MD   Brief Narrative:  [name] HPI on 03/18/2020 by Dr. [name] a 59 y.o.malewith medical history significant ofcoronary artery disease status postCABG, ischemic cardiomyopathy with ejection fraction of 20 to 25% status post ICD placement, chronic hypoxemic respiratory failure secondary to underlying COPD-on 4 L of oxygen via nasal cannulae at home, recurrent right lung cancer-currently getting systemic chemotherapy, chemotherapy-induced anemia status post recent PRBC transfusion, prostate cancerpresents to emergency department with shortness of breath since 2 weeks.  Patient tells me that that he has shortness of breath since 2 weeks, which is getting worse, associated with leg swelling, orthopnea, PND and weight gain of 20 pounds in last 2 months. Denies chest pain, fever, chills, cough, congestion, nausea, vomiting, abdominal pain, urinary or bowel changes. He is compliant with his home medication however he eats high sodium diet.He cooks for himself and eatsbacon/red meat. "I like salt in my food".  He is on 4 L of oxygen via nasal cannulae at home. Denies current cigarette smoking, alcohol, illicit drug use. Has history of non-small cell lung cancer diagnosed in February 2015, Recurrent lung cancer diagnosed in September 2020 currently getting chemotherapy every 3 weeks. Followed by oncology Dr. [name].Prostate adenocarcinomadiagnosed in November 2019-followed by Dr. [name].He was transfused PRBC yesterday due to chemotherapy-induced anemia. He is fully vaccinated against COVID-19.  He tells me that he admitted at [facility] for pneumonia and received antibiotics.  Subjective: Becoming short of breath at rest and asks for face mask. Has a very mild cough without sputum production.     Assessment & Plan:    Acute on  chronic hypoxic respiratory failure secondary to acute on chronic combined systolic and CHF exacerbation/ischemic cardiomyopathy - initially requiring BiPAP - EF 20-25% with AICD - He has been diuresed in the hospital and cardiology recommends stopping IV diuretics- he has been switched to 40 mg Lasix daily- K+ is on high end and he has not required K+ replacement in the hospital with the lasix- thus, I will not order any  - weight 134> 118.1 (per documentation in Epic) - cardiology does not feel he is a candidate for further advance CHF treatments - at baseline he requires 4-5 L O2 and he is at baseline (at rest and on exertion pulse ox > 90) but continues to request non-rebreather mask  Severe deconditioning/ dyspnea on exertion  - barely can walk 50 ft without becoming short of breath- see below- not a candidate for further chemo thus palliative care consulted - he wanted to go home yesterday but when being wheeled to car started feeling short of breath again - today, per RN, he continues to have a pulse ox in the 90s at rest- CXR suggestive of a right mid to lower lung opacity- no fevers or leukocytosis. He has a mild dry cough.   Non-small cell lung cancer diagnosed in February 2015 - treated by [name] with chemo who on 10/18 documented that his performance status was such that he would not be giving further chemo - he plans to f/u in 1 month  Prostate CA- diagnosed 03/2018 - s/p seed implants -   COPD - appears stable/ no wheezing noted  Depression/ anxiety - cont Wellbutrin  GOC  - he has declined hospice home care  - we still are trying to see if he can go home with family and  hospice as he cannot go home to live alone - will try to convince him to switch back to nasal cannula as he cannot go home with a non-rebreather-    Time spent in minutes: 35 DVT prophylaxis: enoxaparin (LOVENOX) injection 40 mg Start: 03/18/20 1130 SCDs Start: 03/18/20 1125  Code  Status: DNI  Family Communication:   Disposition Plan:  Status is: Inpatient  Remains inpatient appropriate because:trying to find a safe dc plan.   Dispo: The patient is from: Home              Anticipated d/c is to: TBD              Anticipated d/c date is: 2 days              Patient currently is medically stable to d/c.      Consultants:   Palliative care  cardiology Procedures:   ECHO Antimicrobials:  Anti-infectives (From admission, onward)   None       Objective: Vitals:   03/29/20 1033 03/29/20 1100 03/29/20 1200 03/29/20 1300  BP:      Pulse:  (!) 102 100 96  Resp:  (!) 25 (!) 22 (!) 21  Temp: 98.5 F (36.9 C)     TempSrc: Oral     SpO2:  100% 100% 100%  Weight:      Height:        Intake/Output Summary (Last 24 hours) at 03/29/2020 1512 Last data filed at 03/29/2020 1236 Gross per 24 hour  Intake 595 ml  Output 1375 ml  Net -780 ml   Filed Weights   03/26/20 0400 03/27/20 0713 03/28/20 0400  Weight: 54.2 kg 54.2 kg 53.6 kg    Examination: General exam: Appears comfortable  HEENT: PERRLA, oral mucosa moist, no sclera icterus or thrush Respiratory system: Clear to auscultation. Respiratory effort normal. Cardiovascular system: S1 & S2 heard,  No murmurs  Gastrointestinal system: Abdomen soft, non-tender, nondistended. Normal bowel sounds   Central nervous system: Alert and oriented. No focal neurological deficits. Extremities: No cyanosis, clubbing or edema Skin: No rashes or ulcers Psychiatry:  Mood & affect appropriate.     Data Reviewed: I have personally reviewed following labs and imaging studies  CBC: Recent Labs  Lab 03/26/20 0229  HGB 9.1*  HCT 60.7*   Basic Metabolic Panel: Recent Labs  Lab 03/23/20 1046 03/25/20 0515 03/26/20 0229 03/27/20 0335 03/28/20 0552  NA 138 137 135 136 139  K 4.5 4.8 4.6 4.4 4.1  CL 95* 98 97* 97* 97*  CO2 31 28 29 29  34*  GLUCOSE 105* 105* 112* 117* 102*  BUN 21* 18 20 22* 20    CREATININE 1.06 0.89 0.99 0.91 0.91  CALCIUM 9.2 9.3 9.1 9.0 9.4   GFR: Estimated Creatinine Clearance: 67.9 mL/min (by C-G formula based on SCr of 0.91 mg/dL). Liver Function Tests: No results for input(s): AST, ALT, ALKPHOS, BILITOT, PROT, ALBUMIN in the last 168 hours. No results for input(s): LIPASE, AMYLASE in the last 168 hours. No results for input(s): AMMONIA in the last 168 hours. Coagulation Profile: No results for input(s): INR, PROTIME in the last 168 hours. Cardiac Enzymes: No results for input(s): CKTOTAL, CKMB, CKMBINDEX, TROPONINI in the last 168 hours. BNP (last 3 results) No results for input(s): PROBNP in the last 8760 hours. HbA1C: No results for input(s): HGBA1C in the last 72 hours. CBG: Recent Labs  Lab 03/22/20 1556  GLUCAP 79   Lipid Profile: No results  for input(s): CHOL, HDL, LDLCALC, TRIG, CHOLHDL, LDLDIRECT in the last 72 hours. Thyroid Function Tests: No results for input(s): TSH, T4TOTAL, FREET4, T3FREE, THYROIDAB in the last 72 hours. Anemia Panel: No results for input(s): VITAMINB12, FOLATE, FERRITIN, TIBC, IRON, RETICCTPCT in the last 72 hours. Urine analysis:    Component Value Date/Time   COLORURINE STRAW (A) 12/12/2018 1927   APPEARANCEUR CLEAR 12/12/2018 1927   LABSPEC 1.011 12/12/2018 1927   PHURINE 5.0 12/12/2018 1927   GLUCOSEU NEGATIVE 12/12/2018 1927   HGBUR NEGATIVE 12/12/2018 1927   BILIRUBINUR NEGATIVE 12/12/2018 [place] NEGATIVE 12/12/2018 1927   PROTEINUR NEGATIVE 12/12/2018 1927   UROBILINOGEN 0.2 08/06/2013 1436   NITRITE NEGATIVE 12/12/2018 1927   LEUKOCYTESUR NEGATIVE 12/12/2018 1927   Sepsis Labs: @LABRCNTIP (procalcitonin:4,lacticidven:4) )No results found for this or any previous visit (from the past 240 hour(s)).       Radiology Studies: DG Chest 1 View  Result Date: 03/29/2020 CLINICAL DATA:  Short of breath.  History of lung carcinoma. EXAM: CHEST  1 VIEW COMPARISON:  03/18/2020 and earlier  exams. FINDINGS: Stable changes from previous cardiac surgery. Left coronary artery stents. Masslike opacity superimposed over the right hilum is stable. No convincing mediastinal masses. Left hemithorax volume loss with mediastinal shift to the left. There is consolidation in the left lung most apparent in the upper lung, as well as linear/reticular type opacities, stable from the prior study. On the right, patchy airspace opacity is noted in the mid and lower lung superimposed on chronic interstitial thickening. The lung is hyperexpanded. No pneumothorax. Stable left anterior chest wall AICD and right anterior chest wall Port-A-Cath. IMPRESSION: 1. New patchy airspace opacity in the right mid to lower lung superimposed on chronic interstitial thickening. New lung opacities are suspicious for superimposed pneumonia. 2. No other change from the prior exam. Extensive chronic changes as detailed. Electronically Signed   By: Lajean Manes M.D.   On: 03/29/2020 08:39      Scheduled Meds: . aspirin EC  81 mg Oral Daily  . atorvastatin  40 mg Oral Daily  . buPROPion  150 mg Oral Daily  . Chlorhexidine Gluconate Cloth  6 each Topical Daily  . diclofenac Sodium  4 g Topical Daily  . enoxaparin (LOVENOX) injection  40 mg Subcutaneous Q24H  . folic acid  1 mg Oral Daily  . furosemide  40 mg Oral Daily  . lidocaine  1 patch Transdermal Q24H  . metoprolol succinate  12.5 mg Oral Daily  . mometasone-formoterol  2 puff Inhalation BID  . sodium chloride flush  10-40 mL Intracatheter Q12H  . sodium chloride flush  3 mL Intravenous Q12H  . umeclidinium bromide  1 puff Inhalation Daily   Continuous Infusions: . sodium chloride       LOS: 11 days      Debbe Odea, MD [facility] Pager: [email or web address] 03/29/2020, 3:12 PM

## 2020-03-29 NOTE — TOC Progression Note (Signed)
Transition of Care [facility]) - Progression Note    Patient Details  Name: [name] MRN: [id number] Date of Birth: May 25, 1961  Transition of Care [facility]) CM/SW Contact  Bartholomew Crews, RN Phone Number: [phone] 03/29/2020, 2:07 PM  Clinical Narrative:     Spoke with patient at the bedside. Patient up in chair with NRB. He states that he has oxygen at home, but it only goes up to 5. He states that his oxygen is through [employer] in [place]. He says he may go to his daughter's home in [place] at discharge. He stated that he should know discharge destination by end of day.   Received call from [facility] with palliative. Family meeting at 2pm.   [facility] following for transition needs.   Expected Discharge Plan: [place] Barriers to Discharge:  (does not have his key to his oxygen, friend has the key)  Expected Discharge Plan and Services Expected Discharge Plan: [place]   Discharge Planning Services: CM Consult Post Acute Care Choice: Home Health, Durable Medical Equipment Living arrangements for the past 2 months: Apartment Expected Discharge Date: 03/28/20               DME Arranged: Gilford Rile rolling with seat DME Agency: [employer] Date DME Agency Contacted: 03/28/20 Time DME Agency Contacted: [phone] Representative spoke with at DME Agency: Jodell Cipro HH Arranged: RN, PT, OT [facility] Agency: [facility] Date [place]: 03/28/20 Time [place]: 1608 Representative spoke with at [place]: [place] (SDOH) Interventions Food Insecurity Interventions: Intervention Not Indicated Financial Strain Interventions: Intervention Not Indicated Transportation Interventions: [facility]  Readmission Risk Interventions Readmission Risk Prevention Plan 03/28/2020  Transportation Screening Complete  HRI or Home Care Consult Complete  Social Work Consult for [place]  Planning/Counseling Complete  Palliative Care Screening Not Applicable  Medication Review Press photographer) Complete  Some recent data might be hidden

## 2020-03-29 NOTE — Progress Notes (Signed)
   03/29/20 1940  Assess: MEWS Score  Temp 99 F (37.2 C)  BP 101/70  Pulse Rate (!) 101  ECG Heart Rate (!) 102  Resp (!) 26  SpO2 100 %  O2 Device Non-rebreather Mask  O2 Flow Rate (L/min) 15 L/min  Assess: MEWS Score  MEWS Temp 0  MEWS Systolic 0  MEWS Pulse 1  MEWS RR 2  MEWS LOC 0  MEWS Score 3  MEWS Score Color Yellow  Assess: if the MEWS score is Yellow or Red  Were vital signs taken at a resting state? Yes  Focused Assessment No change from prior assessment  Early Detection of Sepsis Score *See Row Information* Low  MEWS guidelines implemented *See Row Information* No, previously yellow, continue vital signs every 4 hours  Treat  MEWS Interventions Administered scheduled meds/treatments;Other (Comment);Administered prn meds/treatments (RT gave scheduled neb, ativan/pain meds given)  Pain Scale 0-10  Pain Score 5  Pain Type Chronic pain  Pain Location Back  Pain Intervention(s) Medication (See eMAR)  Document  Patient Outcome Other (Comment) (chronic yellow)  Progress note created (see row info) Yes   Patient is a chronic yellow due to increased RR and HR.  VS are already at q4h intervals and will continue.

## 2020-03-30 ENCOUNTER — Inpatient Hospital Stay (HOSPITAL_COMMUNITY): Payer: Medicare HMO

## 2020-03-30 DIAGNOSIS — I255 Ischemic cardiomyopathy: Secondary | ICD-10-CM | POA: Diagnosis not present

## 2020-03-30 DIAGNOSIS — Z515 Encounter for palliative care: Secondary | ICD-10-CM | POA: Diagnosis not present

## 2020-03-30 DIAGNOSIS — E46 Unspecified protein-calorie malnutrition: Secondary | ICD-10-CM

## 2020-03-30 DIAGNOSIS — I25810 Atherosclerosis of coronary artery bypass graft(s) without angina pectoris: Secondary | ICD-10-CM | POA: Diagnosis not present

## 2020-03-30 DIAGNOSIS — I5043 Acute on chronic combined systolic (congestive) and diastolic (congestive) heart failure: Secondary | ICD-10-CM | POA: Diagnosis not present

## 2020-03-30 DIAGNOSIS — R0602 Shortness of breath: Secondary | ICD-10-CM

## 2020-03-30 MED ORDER — LORAZEPAM 2 MG/ML IJ SOLN
0.5000 mg | Freq: Once | INTRAMUSCULAR | Status: AC
  Administered 2020-03-30: 0.5 mg via INTRAVENOUS
  Filled 2020-03-30: qty 1

## 2020-03-30 MED ORDER — ALPRAZOLAM 0.5 MG PO TABS
1.0000 mg | ORAL_TABLET | Freq: Three times a day (TID) | ORAL | Status: DC | PRN
  Administered 2020-03-31 – 2020-04-03 (×4): 1 mg via ORAL
  Filled 2020-03-30 (×4): qty 2

## 2020-03-30 MED ORDER — LORAZEPAM 2 MG/ML IJ SOLN: 0.5000 mg | Freq: Once | INTRAMUSCULAR | Status: DC

## 2020-03-30 MED ORDER — ALPRAZOLAM 0.5 MG PO TABS
1.0000 mg | ORAL_TABLET | Freq: Every day | ORAL | Status: DC
  Administered 2020-03-30 – 2020-04-09 (×11): 1 mg via ORAL
  Filled 2020-03-30 (×12): qty 2

## 2020-03-30 MED ORDER — LORAZEPAM 0.5 MG PO TABS: 0.5000 mg | ORAL_TABLET | Freq: Two times a day (BID) | ORAL | Status: DC

## 2020-03-30 MED ORDER — DEXAMETHASONE SODIUM PHOSPHATE 10 MG/ML IJ SOLN
8.0000 mg | INTRAMUSCULAR | Status: DC
  Administered 2020-03-30 – 2020-04-03 (×5): 8 mg via INTRAVENOUS
  Filled 2020-03-30 (×5): qty 1

## 2020-03-30 MED ORDER — MORPHINE SULFATE (CONCENTRATE) 10 MG/0.5ML PO SOLN
10.0000 mg | ORAL | Status: DC
  Administered 2020-03-31 – 2020-04-08 (×28): 10 mg via ORAL
  Filled 2020-03-30 (×35): qty 0.5

## 2020-03-30 MED ORDER — ACETAMINOPHEN 500 MG PO TABS
1000.0000 mg | ORAL_TABLET | Freq: Three times a day (TID) | ORAL | Status: DC
  Administered 2020-03-30 – 2020-04-10 (×30): 1000 mg via ORAL
  Filled 2020-03-30 (×32): qty 2

## 2020-03-30 MED ORDER — MORPHINE SULFATE (PF) 2 MG/ML IV SOLN
2.0000 mg | INTRAVENOUS | Status: DC | PRN
  Administered 2020-03-31: 2 mg via INTRAVENOUS
  Filled 2020-03-30 (×2): qty 1

## 2020-03-30 MED ORDER — IOHEXOL 350 MG/ML SOLN
100.0000 mL | Freq: Once | INTRAVENOUS | Status: AC | PRN
  Administered 2020-03-30: 100 mL via INTRAVENOUS

## 2020-03-30 MED ORDER — LORAZEPAM 1 MG PO TABS: 1.0000 mg | ORAL_TABLET | Freq: Two times a day (BID) | ORAL | Status: DC

## 2020-03-30 NOTE — Progress Notes (Addendum)
PROGRESS NOTE    [name]   PNT:[id number]  DOB: 13-Jan-1961  DOA: 03/18/2020 PCP: [name], MD   Brief Narrative:  [name] HPI on 03/18/2020 by Dr. [name] a 59 y.o.malewith medical history significant ofcoronary artery disease status postCABG, ischemic cardiomyopathy with ejection fraction of 20 to 25% status post ICD placement, chronic hypoxemic respiratory failure secondary to underlying COPD-on 4 L of oxygen via nasal cannulae at home, recurrent right lung cancer-currently getting systemic chemotherapy, chemotherapy-induced anemia status post recent PRBC transfusion, prostate cancerpresents to emergency department with shortness of breath since 2 weeks.  Patient tells me that that he has shortness of breath since 2 weeks, which is getting worse, associated with leg swelling, orthopnea, PND and weight gain of 20 pounds in last 2 months. Denies chest pain, fever, chills, cough, congestion, nausea, vomiting, abdominal pain, urinary or bowel changes. He is compliant with his home medication however he eats high sodium diet.He cooks for himself and eatsbacon/red meat. "I like salt in my food".  He is on 4 L of oxygen via nasal cannulae at home. Denies current cigarette smoking, alcohol, illicit drug use. Has history of non-small cell lung cancer diagnosed in February 2015, Recurrent lung cancer diagnosed in September 2020 currently getting chemotherapy every 3 weeks. Followed by oncology Dr. [name].Prostate adenocarcinomadiagnosed in November 2019-followed by Dr. [name].He was transfused PRBC yesterday due to chemotherapy-induced anemia. He is fully vaccinated against COVID-19.  He tells me that he admitted at [facility] for pneumonia and received antibiotics.  Subjective: Admits to having anxiety and willing to take sometime for it. No cough. He thinks he will go home with his daughter.    Assessment & Plan:     Acute on chronic hypoxic respiratory failure secondary to acute on chronic combined systolic and CHF exacerbation/ischemic cardiomyopathy - initially requiring BiPAP - EF 20-25% with AICD - He has been diuresed in the hospital and cardiology recommends stopping IV diuretics- he has been switched to 40 mg Lasix daily- K+ is on high end and he has not required K+ replacement in the hospital with the lasix- thus, I will not order any  - cardiology does not feel he is a candidate for further advance CHF treatments - at baseline he requires 4-5 L O2 and he is at baseline (at rest and on exertion pulse ox > 90) but continues to request non-rebreather mask  Severe deconditioning/ dyspnea on exertion  - barely can walk 50 ft without becoming short of breath- see below- not a candidate for further chemo thus palliative care consulted - he wanted to go home yesterday but when being wheeled to car started feeling short of breath again -he continues to have a pulse ox in the 90s at rest- CXR suggestive of a right mid to lower lung opacity- no fevers or leukocytosis. He has a mild dry cough but I feel his dyspnea is related to cancer and severe deconditioning.  Non-small cell lung cancer diagnosed in February 2015 - treated by [name] with chemo who, on 10/18, documented that his performance status was such that he would not be giving further chemo - he plans to f/u in 1 month  Prostate CA- diagnosed 03/2018 - s/p seed implants -   COPD - appears stable/ no wheezing noted  Depression/ anxiety - cont Wellbutrin  GOC  - he has declined hospice home care  - we still are trying to see if he can go home with family  and hospice as he cannot go home to live alone - I have given him small doses of Ativan today for his anxiety but he is still not comfortable enough to remove the non rebreather and use a  - appreciate palliative care assistance  Time spent in minutes: 35- multiple  discussions with case manager, palliative care and nurse today. DVT prophylaxis: enoxaparin (LOVENOX) injection 40 mg Start: 03/18/20 1130 SCDs Start: 03/18/20 1125 Code Status: DNR Family Communication:   Disposition Plan:  Status is: Inpatient  Remains inpatient appropriate because:trying to find a safe dc plan.   Dispo: The patient is from: Home              Anticipated d/c is to: TBD              Anticipated d/c date is: 2 days              Patient currently is medically stable to d/c.      Consultants:   Palliative care  cardiology Procedures:   ECHO Antimicrobials:  Anti-infectives (From admission, onward)   None       Objective: Vitals:   03/30/20 0533 03/30/20 0738 03/30/20 0945 03/30/20 1203  BP: 98/63 100/74  105/78  Pulse:    (!) 111  Resp: (!) 26   (!) 24  Temp: 98.7 F (37.1 C) 98.1 F (36.7 C)  98.2 F (36.8 C)  TempSrc: Oral Oral  Oral  SpO2: 100%  100% 100%  Weight:      Height:        Intake/Output Summary (Last 24 hours) at 03/30/2020 1548 Last data filed at 03/30/2020 1246 Gross per 24 hour  Intake 830 ml  Output 1150 ml  Net -320 ml   Filed Weights   03/28/20 0400  Weight: 53.6 kg    Examination: General exam: Appears comfortable  HEENT: PERRLA, oral mucosa moist, no sclera icterus or thrush Respiratory system: Clear to auscultation. Respiratory effort normal. Cardiovascular system: S1 & S2 heard,  No murmurs  Gastrointestinal system: Abdomen soft, non-tender, nondistended. Normal bowel sounds   Central nervous system: Alert and oriented. No focal neurological deficits. Extremities: No cyanosis, clubbing or edema Skin: No rashes or ulcers Psychiatry:  Mood & affect appropriate.   Data Reviewed: I have personally reviewed following labs and imaging studies  CBC: Recent Labs  Lab 03/26/20 0229  HGB 9.1*  HCT 47.8*   Basic Metabolic Panel: Recent Labs  Lab 03/25/20 0515 03/26/20 0229 03/27/20 0335 03/28/20 0552   NA 137 135 136 139  K 4.8 4.6 4.4 4.1  CL 98 97* 97* 97*  CO2 28 29 29  34*  GLUCOSE 105* 112* 117* 102*  BUN 18 20 22* 20  CREATININE 0.89 0.99 0.91 0.91  CALCIUM 9.3 9.1 9.0 9.4   GFR: Estimated Creatinine Clearance: 67.9 mL/min (by C-G formula based on SCr of 0.91 mg/dL). Liver Function Tests: No results for input(s): AST, ALT, ALKPHOS, BILITOT, PROT, ALBUMIN in the last 168 hours. No results for input(s): LIPASE, AMYLASE in the last 168 hours. No results for input(s): AMMONIA in the last 168 hours. Coagulation Profile: No results for input(s): INR, PROTIME in the last 168 hours. Cardiac Enzymes: No results for input(s): CKTOTAL, CKMB, CKMBINDEX, TROPONINI in the last 168 hours. BNP (last 3 results) No results for input(s): PROBNP in the last 8760 hours. HbA1C: No results for input(s): HGBA1C in the last 72 hours. CBG: No results for input(s): GLUCAP in the last 168 hours.  Lipid Profile: No results for input(s): CHOL, HDL, LDLCALC, TRIG, CHOLHDL, LDLDIRECT in the last 72 hours. Thyroid Function Tests: No results for input(s): TSH, T4TOTAL, FREET4, T3FREE, THYROIDAB in the last 72 hours. Anemia Panel: No results for input(s): VITAMINB12, FOLATE, FERRITIN, TIBC, IRON, RETICCTPCT in the last 72 hours. Urine analysis:    Component Value Date/Time   COLORURINE STRAW (A) 12/12/2018 1927   APPEARANCEUR CLEAR 12/12/2018 1927   LABSPEC 1.011 12/12/2018 1927   PHURINE 5.0 12/12/2018 1927   GLUCOSEU NEGATIVE 12/12/2018 1927   HGBUR NEGATIVE 12/12/2018 1927   BILIRUBINUR NEGATIVE 12/12/2018 [place] NEGATIVE 12/12/2018 1927   PROTEINUR NEGATIVE 12/12/2018 1927   UROBILINOGEN 0.2 08/06/2013 1436   NITRITE NEGATIVE 12/12/2018 1927   LEUKOCYTESUR NEGATIVE 12/12/2018 1927   Sepsis Labs: @LABRCNTIP (procalcitonin:4,lacticidven:4) )No results found for this or any previous visit (from the past 240 hour(s)).       Radiology Studies: DG Chest 1 View  Result Date:  03/29/2020 CLINICAL DATA:  Short of breath.  History of lung carcinoma. EXAM: CHEST  1 VIEW COMPARISON:  03/18/2020 and earlier exams. FINDINGS: Stable changes from previous cardiac surgery. Left coronary artery stents. Masslike opacity superimposed over the right hilum is stable. No convincing mediastinal masses. Left hemithorax volume loss with mediastinal shift to the left. There is consolidation in the left lung most apparent in the upper lung, as well as linear/reticular type opacities, stable from the prior study. On the right, patchy airspace opacity is noted in the mid and lower lung superimposed on chronic interstitial thickening. The lung is hyperexpanded. No pneumothorax. Stable left anterior chest wall AICD and right anterior chest wall Port-A-Cath. IMPRESSION: 1. New patchy airspace opacity in the right mid to lower lung superimposed on chronic interstitial thickening. New lung opacities are suspicious for superimposed pneumonia. 2. No other change from the prior exam. Extensive chronic changes as detailed. Electronically Signed   By: Lajean Manes M.D.   On: 03/29/2020 08:39      Scheduled Meds: . aspirin EC  81 mg Oral Daily  . atorvastatin  40 mg Oral Daily  . buPROPion  150 mg Oral Daily  . Chlorhexidine Gluconate Cloth  6 each Topical Daily  . diclofenac Sodium  4 g Topical Daily  . enoxaparin (LOVENOX) injection  40 mg Subcutaneous Q24H  . folic acid  1 mg Oral Daily  . furosemide  40 mg Oral Daily  . lidocaine  1 patch Transdermal Q24H  . LORazepam  0.5 mg Oral BID  . metoprolol succinate  12.5 mg Oral Daily  . mometasone-formoterol  2 puff Inhalation BID  . sodium chloride flush  10-40 mL Intracatheter Q12H  . sodium chloride flush  3 mL Intravenous Q12H  . umeclidinium bromide  1 puff Inhalation Daily   Continuous Infusions: . sodium chloride       LOS: 12 days      Debbe Odea, MD [facility] Pager: [email or web address] 03/30/2020, 3:48 PM

## 2020-03-30 NOTE — Progress Notes (Signed)
   03/30/20 1611  Assess: MEWS Score  Temp 98.7 F (37.1 C)  BP 109/66  Pulse Rate (!) 117  ECG Heart Rate (!) 117  Resp (!) 26  Level of Consciousness Alert  SpO2 94 %  O2 Device Non-rebreather Mask  O2 Flow Rate (L/min) 15 L/min  Assess: MEWS Score  MEWS Temp 0  MEWS Systolic 0  MEWS Pulse 2  MEWS RR 2  MEWS LOC 0  MEWS Score 4  MEWS Score Color Red  Assess: if the MEWS score is Yellow or Red  Were vital signs taken at a resting state? Yes  Focused Assessment Change from prior assessment (see assessment flowsheet)  Early Detection of Sepsis Score *See Row Information* Low  MEWS guidelines implemented *See Row Information* Yes  Treat  MEWS Interventions Other (Comment) (continue to monitor)  Pain Scale 0-10  Pain Score 0  Notify: Charge Nurse/RN  Name of Charge Nurse/RN Notified Eun  Date Charge Nurse/RN Notified 03/30/20  Time Charge Nurse/RN Notified 1614  Notify: Provider  Provider Name/Title Rizwan  Date Provider Notified 03/30/20  Time Provider Notified 1614  Notification Type Page  Notification Reason Change in status  Response No new orders (continue to monitor)  Date of Provider Response 03/30/20  Time of Provider Response 1614  Notify: Rapid Response  Name of Rapid Response RN Notified  (N/A)  Document  Patient Outcome Stabilized after interventions (continue to monitor)  Progress note created (see row info) Yes

## 2020-03-30 NOTE — Progress Notes (Signed)
Accompanied patient to CT. Patient is back on unit with oxygen set up properly and call bell within reach.

## 2020-03-30 NOTE — Progress Notes (Signed)
   03/30/20 1900  Assess: MEWS Score  Temp 98.6 F (37 C)  BP 99/64  Pulse Rate (!) 110  ECG Heart Rate (!) 108  Resp (!) 28  SpO2 100 %  Assess: MEWS Score  MEWS Temp 0  MEWS Systolic 1  MEWS Pulse 1  MEWS RR 2  MEWS LOC 0  MEWS Score 4  MEWS Score Color Red  Assess: if the MEWS score is Yellow or Red  Were vital signs taken at a resting state? Yes  Focused Assessment No change from prior assessment  Early Detection of Sepsis Score *See Row Information* Medium  MEWS guidelines implemented *See Row Information* No, vital signs rechecked  Document  Patient Outcome Other (Comment) (patient baseline)  Progress note created (see row info) Yes  Vital signs retaken with resulting yellow MEWS. This is baseline for patient; no escalation or intervention required. Continue q4 vital signs.

## 2020-03-30 NOTE — Progress Notes (Signed)
Attempted to wean patient off of NRB. After less than 1 minute on 6L [place] he said he needed the NRB back on even though his sats were still at 97%. I have notified Dr. Wynelle Cleveland.

## 2020-03-30 NOTE — Progress Notes (Signed)
Physical Therapy Treatment Patient Details Name: [name] MRN: [id number] DOB: Dec 31, 1960 Today's Date: 03/30/2020    History of Present Illness [name] is a 59 y.o. male with medical history significant of coronary artery disease status post CABG, ischemic cardiomyopathy with ejection fraction of 20 to 25% status post ICD placement, chronic hypoxemic respiratory failure secondary to underlying COPD-on 4 L of oxygen via nasal cannulae at home, recurrent right lung cancer-currently getting systemic chemotherapy, chemotherapy-induced anemia status post recent PRBC transfusion, prostate cancer presents to emergency department with shortness of breath since 2 weeks.  Admitted with respiratory failure and CHF exacerbation.     PT Comments    Pt progressing well towards his physical therapy goals. Session focused on seated exercises and functional mobility. Pt ambulating 15 ft x 2 with a Rollator at supervision level with seated rest breaks in between. SpO2 100% on 15L O2 via NRB. D/c plan remains appropriate.     Follow Up Recommendations  Home health PT;Supervision/Assistance - 24 hour     Equipment Recommendations  Other (comment) (rollator)    Recommendations for Other Services       Precautions / Restrictions Precautions Precautions: Fall;Other (comment) Precaution Comments: NRB Restrictions Weight Bearing Restrictions: No    Mobility  Bed Mobility               General bed mobility comments: OOB in chair  Transfers Overall transfer level: Needs assistance Equipment used: 4-wheeled walker Transfers: Sit to/from Stand Sit to Stand: Supervision         General transfer comment: Cues for locking Rollator prior to transition and hand placement  Ambulation/Gait Ambulation/Gait assistance: Supervision Gait Distance (Feet): 30 Feet (15", 15") Assistive device: 4-wheeled walker Gait Pattern/deviations: Step-through pattern;Decreased stride length Gait  velocity: decreased   General Gait Details: Pt with mild dynamic instability, requiring supervision and assist for line management.    Stairs             Wheelchair Mobility    Modified Rankin (Stroke Patients Only)       Balance Overall balance assessment: Mild deficits observed, not formally tested                                          Cognition Arousal/Alertness: Awake/alert Behavior During Therapy: WFL for tasks assessed/performed Overall Cognitive Status: Within Functional Limits for tasks assessed                                        Exercises General Exercises - Lower Extremity Long Arc Quad: AROM;Both;10 reps;Seated Hip Flexion/Marching: Both;10 reps;Seated Heel Raises: Both;10 reps;Seated    General Comments        Pertinent Vitals/Pain Pain Assessment: Faces Faces Pain Scale: No hurt    Home Living                      Prior Function            PT Goals (current goals can now be found in the care plan section) Acute Rehab PT Goals Patient Stated Goal: to go home PT Goal Formulation: With patient Time For Goal Achievement: 04/11/20 Potential to Achieve Goals: Good Progress towards PT goals: Progressing toward goals    Frequency    Min 3X/week  PT Plan Current plan remains appropriate    Co-evaluation              AM-PAC PT "6 Clicks" Mobility   Outcome Measure  Help needed turning from your back to your side while in a flat bed without using bedrails?: None Help needed moving from lying on your back to sitting on the side of a flat bed without using bedrails?: None Help needed moving to and from a bed to a chair (including a wheelchair)?: None Help needed standing up from a chair using your arms (e.g., wheelchair or bedside chair)?: None Help needed to walk in hospital room?: None Help needed climbing 3-5 steps with a railing? : A Little 6 Click Score: 23    End of  Session Equipment Utilized During Treatment: Gait belt;Oxygen Activity Tolerance: Patient tolerated treatment well Patient left: in chair;with call bell/phone within reach;with chair alarm set Nurse Communication: Mobility status PT Visit Diagnosis: Muscle weakness (generalized) (M62.81)     Time: 7076-1518 PT Time Calculation (min) (ACUTE ONLY): 20 min  Charges:  $Therapeutic Activity: 8-22 mins                     Wyona Almas, PT, DPT Acute Rehabilitation Services Pager [phone] Office [phone]    Deno Etienne 03/30/2020, 3:14 PM
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
## 2020-03-30 NOTE — TOC Progression Note (Signed)
Transition of Care [facility]) - Progression Note    Patient Details  Name: [name] MRN: [id number] Date of Birth: Jun 27, 1960  Transition of Care [facility] CM/SW Contact  Zenon Mayo, RN Phone Number: 03/30/2020, 9:43 AM  Clinical Narrative:    NCM received call back from [facility] in [place] stating they do not go to patient's area.  Patient is still here he did not discharge, looks like he may be considering home Hospice and may go home with daughter in [place].  TOC team will continue to follow for dc.  If he goes to [place] will need to cancel the [facility] in [place].    Expected Discharge Plan: [place] Barriers to Discharge:  (does not have his key to his oxygen, friend has the key)  Expected Discharge Plan and Services Expected Discharge Plan: [place]   Discharge Planning Services: CM Consult Post Acute Care Choice: Home Health, Durable Medical Equipment Living arrangements for the past 2 months: Apartment Expected Discharge Date: 03/28/20               DME Arranged: Gilford Rile rolling with seat DME Agency: [employer] Date DME Agency Contacted: 03/28/20 Time DME Agency Contacted: [phone] Representative spoke with at DME Agency: Jodell Cipro HH Arranged: RN, PT, OT [facility] Agency: [facility] Date [place]: 03/28/20 Time [place]: [id number] Representative spoke with at [place]: [place] (SDOH) Interventions Food Insecurity Interventions: Intervention Not Indicated Financial Strain Interventions: Intervention Not Indicated Transportation Interventions: [facility]  Readmission Risk Interventions Readmission Risk Prevention Plan 03/28/2020  Transportation Screening Complete  HRI or Home Care Consult Complete  Social Work Consult for [place] Planning/Counseling Complete  Palliative Care Screening Not Applicable  Medication Review Human resources officer) Complete  Some recent data might be hidden

## 2020-03-31 LAB — COMPREHENSIVE METABOLIC PANEL
ALT: 38 U/L (ref 0–44)
AST: 32 U/L (ref 15–41)
Albumin: 2.7 g/dL — ABNORMAL LOW (ref 3.5–5.0)
Alkaline Phosphatase: 68 U/L (ref 38–126)
Anion gap: 8 (ref 5–15)
BUN: 21 mg/dL — ABNORMAL HIGH (ref 6–20)
CO2: 36 mmol/L — ABNORMAL HIGH (ref 22–32)
Calcium: 9.5 mg/dL (ref 8.9–10.3)
Chloride: 92 mmol/L — ABNORMAL LOW (ref 98–111)
Creatinine, Ser: 0.85 mg/dL (ref 0.61–1.24)
GFR, Estimated: >60 mL/min (ref >60)
Glucose, Bld: 162 mg/dL — ABNORMAL HIGH (ref 70–99)
Potassium: 5.2 mmol/L — ABNORMAL HIGH (ref 3.5–5.1)
Sodium: 136 mmol/L (ref 135–145)
Total Bilirubin: 0.5 mg/dL (ref 0.3–1.2)
Total Protein: 7.4 g/dL (ref 6.5–8.1)

## 2020-03-31 LAB — CBC
HCT: 28.4 % — ABNORMAL LOW (ref 39.0–52.0)
Hemoglobin: 8.7 g/dL — ABNORMAL LOW (ref 13.0–17.0)
MCH: 29.4 pg (ref 26.0–34.0)
MCHC: 30.6 g/dL (ref 30.0–36.0)
MCV: 95.9 fL (ref 80.0–100.0)
Platelets: 464 10*3/uL — ABNORMAL HIGH (ref 150–400)
RBC: 2.96 MIL/uL — ABNORMAL LOW (ref 4.22–5.81)
RDW: 16.5 % — ABNORMAL HIGH (ref 11.5–15.5)
WBC: 5.7 10*3/uL (ref 4.0–10.5)
nRBC: 0 % (ref 0.0–0.2)

## 2020-03-31 LAB — BRAIN NATRIURETIC PEPTIDE: B Natriuretic Peptide: 2367.9 pg/mL — ABNORMAL HIGH (ref 0.0–100.0)

## 2020-03-31 MED ORDER — FUROSEMIDE 10 MG/ML IJ SOLN
40.0000 mg | Freq: Two times a day (BID) | INTRAMUSCULAR | Status: DC
  Administered 2020-03-31 – 2020-04-01 (×2): 40 mg via INTRAVENOUS
  Filled 2020-03-31 (×2): qty 4

## 2020-03-31 MED ORDER — AZITHROMYCIN 250 MG PO TABS
250.0000 mg | ORAL_TABLET | Freq: Every day | ORAL | Status: DC
  Administered 2020-03-31 – 2020-04-02 (×3): 250 mg via ORAL
  Filled 2020-03-31 (×3): qty 1

## 2020-03-31 NOTE — Care Management Important Message (Signed)
Important Message  Patient Details  Name: [name] MRN: [id number] Date of Birth: 1960/07/03   Medicare Important Message Given:  Yes     Shelda Altes 03/31/2020, 9:54 AM

## 2020-03-31 NOTE — Progress Notes (Addendum)
Patient does not want newly prescribed morphine (see MAR and orders). States that it "knocks me out". Informed patient of right to refuse medications and encouraged patient to inform rounding MD of his desires. Patient refused daily weight due to feeling weak.

## 2020-03-31 NOTE — Progress Notes (Signed)
PROGRESS NOTE    [name]   [name]  DOB: 18-Jul-1960  DOA: 03/18/2020 PCP: Garrett Serene, MD   Brief Narrative:  [name] HPI on 03/18/2020 by [name]. Priscille Kluver Norman a 59 y.o.malewith medical history significant ofcoronary artery disease status postCABG, ischemic cardiomyopathy with ejection fraction of 20 to 25% status post ICD placement, chronic hypoxemic respiratory failure secondary to underlying COPD-on 4 L of oxygen via nasal cannulae at home, recurrent right lung cancer-currently getting systemic chemotherapy, chemotherapy-induced anemia status post recent PRBC transfusion, prostate cancerpresents to emergency department with shortness of breath since 2 weeks.  Patient tells me that that he has shortness of breath since 2 weeks, which is getting worse, associated with leg swelling, orthopnea, PND and weight gain of 20 pounds in last 2 months. Denies chest pain, fever, chills, cough, congestion, nausea, vomiting, abdominal pain, urinary or bowel changes. He is compliant with his home medication however he eats high sodium diet.He cooks for himself and eatsbacon/red meat. "I like salt in my food".  He is on 4 L of oxygen via nasal cannulae at home. Denies current cigarette smoking, alcohol, illicit drug use. Has history of non-small cell lung cancer diagnosed in February 2015, Recurrent lung cancer diagnosed in September 2020 currently getting chemotherapy every 3 weeks. Followed by oncology [name]. Julien Norman.Prostate adenocarcinomadiagnosed in November 2019-followed by [name]. Tammi Norman.He was transfused PRBC yesterday due to chemotherapy-induced anemia. He is fully vaccinated against COVID-19.  He tells me that he admitted at [facility] for pneumonia and received antibiotics.  Subjective: He has no new complaints today.  He tells me he feels about the same as yesterday.     Assessment & Plan:    Acute on chronic hypoxic  respiratory failure secondary to acute on chronic combined systolic and CHF /ischemic cardiomyopathy with underlying COPD and Non-small cell lung cancer diagnosed in February 2015 - treated by [name] [name] with chemo who, on 10/18, documented that his performance status was such that he would not be giving further chemo - he planned to f/u in 1 month - initially requiring BiPAP - EF 20-25% with AICD - He has been diuresed in the hospital and cardiology recommends stopping IV diuretics- he has been switched to 40 mg Lasix daily- K+ is on high end and he has not required K+ replacement in the hospital with the lasix- thus, I will not order any  - cardiology does not feel he is a candidate for further advance CHF treatments - at baseline he requires 4-5 L O2 and he is at baseline (at rest and on exertion pulse ox > 90) but continues to request non-rebreather mask - barely can walk 50 ft without becoming short of breath- see below- not a candidate for further chemo thus palliative care consulted - 10/30 - he was declining hospice and he wanted to go home but when being wheeled to car started feeling short of breath again  - CXR suggestive of a right mid to lower lung opacity- no fevers or leukocytosis. He has a mild dry cough but I feel his dyspnea is related to cancer and severe deconditioning. -11/1> [name] [name] from palliative care ordered a CT of the chest yesterday, started Morphine, azithromycin and Prednisone 11/2> CT  reveals>  Interval development of diffuse ground-glass pulmonary infiltrate and right basilar subpleural consolidation. ? 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 Superimposed moderate emphysema....  enlargement of the neoplastic  mass at the right lung base and developing right hilar adenopathy. -  he insists his cough is mild and non productive - ? If he has further pulmonary edema again - will  ask cardiology to weigh in - continue palliative care conversations- he has not accepted full comfort care yet- plans to go stay with daughter- I have asked case manager to set up hospice with plans to go to daughter's home in [place]    Prostate CA- diagnosed 03/2018 - s/p seed implants -   COPD - appears stable/ no wheezing noted  Depression/ anxiety - cont Wellbutrin  GOC  - he has declined hospice home care  - we still are trying to see if he can go home with family and hospice as he cannot go home to live alone - I have given him small doses of Ativan today for his anxiety but he is still not comfortable enough to remove the non rebreather and use a [place] - appreciate palliative care assistance- Morphine scheduled started yesterday (declining this) in addition to PRN - Azithromycin and Decadron also started yesterday   Time spent in minutes: 35-   DVT prophylaxis: enoxaparin (LOVENOX) injection 40 mg Start: 03/18/20 1130 SCDs Start: 03/18/20 1125 Code Status: DNR Family Communication:   Disposition Plan:  Status is: Inpatient  Remains inpatient appropriate because:trying to find a safe dc plan.   Dispo: The patient is from: Home              Anticipated d/c is to: TBD              Anticipated d/c date is: 2 days              Patient currently is not medically stable to d/c.  Consultants:   Palliative care  cardiology Procedures:   ECHO Antimicrobials:  Anti-infectives (From admission, onward)   Start     Dose/Rate Route Frequency Ordered Stop   03/31/20 1000  azithromycin (ZITHROMAX) tablet 250 mg        250 mg Oral Daily 03/31/20 0614         Objective: Vitals:   03/31/20 0400 03/31/20 0700 03/31/20 0730 03/31/20 1100  BP: 98/69  98/66   Pulse: 95  96   Resp: (!) 24  20   Temp: 98.6 F (37 C) 98.5 F (36.9 C) 98.3 F (36.8 C) 98.4 F (36.9 C)  TempSrc: Oral Oral Oral Oral  SpO2: 100%  100%   Weight:      Height:        Intake/Output  Summary (Last 24 hours) at 03/31/2020 1347 Last data filed at 03/31/2020 1109 Gross per 24 hour  Intake 970 ml  Output 850 ml  Net 120 ml   Filed Weights   03/31/20 0022  Weight: 55.8 kg    Examination: General exam: Appears comfortable  HEENT: PERRLA, oral mucosa moist, no sclera icterus or thrush Respiratory system: poor air entry in right lower lung field with mild crackles. Respiratory effort normal. On non rebreather. Cardiovascular system: S1 & S2 heard,  No murmurs  Gastrointestinal system: Abdomen soft, non-tender, nondistended. Normal bowel sounds   Central nervous system: Alert and oriented. No focal neurological deficits. Extremities: No cyanosis, clubbing or edema Skin: No rashes or ulcers Psychiatry:  Mood & affect appropriate.   Data Reviewed: I have personally reviewed following labs and imaging studies  CBC: Recent Labs  Lab 03/26/20 0229 03/31/20 0558  WBC  --  5.7  HGB 9.1*  8.7*  HCT 30.0* 28.4*  MCV  --  95.9  PLT  --  025*   Basic Metabolic Panel: Recent Labs  Lab 03/25/20 0515 03/26/20 0229 03/27/20 0335 03/28/20 0552 03/31/20 0558  NA 137 135 136 139 136  K 4.8 4.6 4.4 4.1 5.2*  CL 98 97* 97* 97* 92*  CO2 28 29 29  34* 36*  GLUCOSE 105* 112* 117* 102* 162*  BUN 18 20 22* 20 21*  CREATININE 0.89 0.99 0.91 0.91 0.85  CALCIUM 9.3 9.1 9.0 9.4 9.5   GFR: Estimated Creatinine Clearance: 75.7 mL/min (by C-G formula based on SCr of 0.85 mg/dL). Liver Function Tests: Recent Labs  Lab 03/31/20 0558  AST 32  ALT 38  ALKPHOS 68  BILITOT 0.5  PROT 7.4  ALBUMIN 2.7*   No results for input(s): LIPASE, AMYLASE in the last 168 hours. No results for input(s): AMMONIA in the last 168 hours. Coagulation Profile: No results for input(s): INR, PROTIME in the last 168 hours. Cardiac Enzymes: No results for input(s): CKTOTAL, CKMB, CKMBINDEX, TROPONINI in the last 168 hours. BNP (last 3 results) No results for input(s): PROBNP in the last 8760  hours. HbA1C: No results for input(s): HGBA1C in the last 72 hours. CBG: No results for input(s): GLUCAP in the last 168 hours. Lipid Profile: No results for input(s): CHOL, HDL, LDLCALC, TRIG, CHOLHDL, LDLDIRECT in the last 72 hours. Thyroid Function Tests: No results for input(s): TSH, T4TOTAL, FREET4, T3FREE, THYROIDAB in the last 72 hours. Anemia Panel: No results for input(s): VITAMINB12, FOLATE, FERRITIN, TIBC, IRON, RETICCTPCT in the last 72 hours. Urine analysis:    Component Value Date/Time   COLORURINE STRAW (A) 12/12/2018 1927   APPEARANCEUR CLEAR 12/12/2018 1927   LABSPEC 1.011 12/12/2018 1927   PHURINE 5.0 12/12/2018 1927   GLUCOSEU NEGATIVE 12/12/2018 1927   HGBUR NEGATIVE 12/12/2018 1927   BILIRUBINUR NEGATIVE 12/12/2018 [place] NEGATIVE 12/12/2018 1927   PROTEINUR NEGATIVE 12/12/2018 1927   UROBILINOGEN 0.2 08/06/2013 1436   NITRITE NEGATIVE 12/12/2018 1927   LEUKOCYTESUR NEGATIVE 12/12/2018 1927   Sepsis Labs: @LABRCNTIP (procalcitonin:4,lacticidven:4) )No results found for this or any previous visit (from the past 240 hour(s)).       Radiology Studies: CT ANGIO CHEST PE W OR WO CONTRAST  Result Date: 03/30/2020 CLINICAL DATA:  Pulmonary fibrosis, lung cancer, dyspnea EXAM: CT ANGIOGRAPHY CHEST WITH CONTRAST TECHNIQUE: Multidetector CT imaging of the chest was performed using the standard protocol during bolus administration of intravenous contrast. Multiplanar CT image reconstructions and MIPs were obtained to evaluate the vascular anatomy. CONTRAST:  1106mL OMNIPAQUE IOHEXOL 350 MG/ML SOLN COMPARISON:  01/06/2020 FINDINGS: Cardiovascular: There is excellent opacification of the pulmonary arterial tree. There is no intraluminal filling defect identified to suggest acute pulmonary embolism. The central pulmonary arteries are enlarged in keeping with changes of pulmonary arterial hypertension. Coronary artery bypass grafting has been performed with  stenting within a left anterior descending saphenous vein graft. There is mild global cardiomegaly with moderate left ventricular dilation and superimposed mild left ventricular hypertrophy. No pericardial effusion. Left subclavian single lead pacemaker is seen with its lead within the right ventricle. Right internal jugular chest port tip is seen within the right atrium. The thoracic aorta demonstrates mild atherosclerotic calcification, but is otherwise unremarkable. Mediastinum/Nodes: Thyroid unremarkable. Pathologic pre-vascular mediastinal adenopathy is again identified exact measurement is difficult due to streak artifact from overlying pacemaker, however, the index lymph node demonstrates progressive enlargement, measuring 2.0 x 2.8 cm at axial image #  47/5. Right hilar adenopathy has progressed with the index lymph node within the right hilum measuring 1.7 x 2.1 cm at axial image # 63/5. thyroid unremarkable. Esophagus unremarkable. Lungs/Pleura: Severe, end-stage cystic lung disease within the left apex with associated left-sided volume loss is again identified with probable 5 cm aspergillosis a again identified at the left apex. Moderate severe centrilobular paraseptal emphysema. There is interval development of diffuse ground-glass pulmonary infiltrate throughout the residual pulmonary parenchyma and new consolidation at the right lung base subpleurally, likely infectious in the appropriate clinical setting. Alternatively, diffuse infiltrate could relate to pulmonary edema with consolidation at the right lung base representing superimposed infection should or changes of radiation pneumonitis in the appropriate clinical setting. Pleural base mass at the right lung base demonstrates interval increase in size measuring 3.7 x 4.4 cm at axial image # 99/11 Upper Abdomen: Extensive reflux of contrast into the hepatic venous system secondary to at least some degree of right heart failure. No acute abnormality.  Musculoskeletal: No acute bone abnormality. Review of the MIP images confirms the above findings. IMPRESSION: Interval development of diffuse ground-glass pulmonary infiltrate and right basilar subpleural consolidation. This may relate to diffuse infection or, more likely, diffuse pulmonary edema with superimposed infectious or post radiation change at the right lung base. 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 Superimposed moderate emphysema. Interval disease progression with enlargement of the neoplastic mass at the right lung base and developing right hilar adenopathy. Progressive pre-vascular adenopathy of unclear etiology. Moderate left ventricular dilation. Reflux of contrast into the hepatic venous vasculature in keeping with at least some degree of right heart failure. No pulmonary embolism. Aortic Atherosclerosis (ICD10-I70.0) and Emphysema (ICD10-J43.9). Electronically Signed   By: Fidela Salisbury MD   On: 03/30/2020 22:33      Scheduled Meds: . acetaminophen  1,000 mg Oral TID  . ALPRAZolam  1 mg Oral QHS  . aspirin EC  81 mg Oral Daily  . atorvastatin  40 mg Oral Daily  . azithromycin  250 mg Oral Daily  . buPROPion  150 mg Oral Daily  . Chlorhexidine Gluconate Cloth  6 each Topical Daily  . dexamethasone (DECADRON) injection  8 mg Intravenous Q24H  . diclofenac Sodium  4 g Topical Daily  . enoxaparin (LOVENOX) injection  40 mg Subcutaneous Q24H  . folic acid  1 mg Oral Daily  . furosemide  40 mg Oral Daily  . lidocaine  1 patch Transdermal Q24H  . metoprolol succinate  12.5 mg Oral Daily  . mometasone-formoterol  2 puff Inhalation BID  . morphine CONCENTRATE  10 mg Oral Q4H  . sodium chloride flush  10-40 mL Intracatheter Q12H  . sodium chloride flush  3 mL Intravenous Q12H  . umeclidinium bromide  1 puff Inhalation Daily   Continuous Infusions: . sodium chloride       LOS: 13 days      Debbe Odea, MD [facility] Pager: [email or web address] 03/31/2020, 1:47 PM

## 2020-03-31 NOTE — Progress Notes (Signed)
This note also relates to the following rows which could not be included: ECG Heart Rate - Cannot attach notes to unvalidated device data    03/30/20 2000  Assess: MEWS Score  Temp 98.7 F (37.1 C)  BP 118/83  Pulse Rate (!) 110  Resp (!) 25  Level of Consciousness Alert  SpO2 100 %  O2 Device Non-rebreather Mask  O2 Flow Rate (L/min) 15 L/min  Assess: MEWS Score  MEWS Temp 0  MEWS Systolic 0  MEWS Pulse 1  MEWS RR 1  MEWS LOC 0  MEWS Score 2  MEWS Score Color Yellow  Assess: if the MEWS score is Yellow or Red  Were vital signs taken at a resting state? Yes  Focused Assessment No change from prior assessment  Early Detection of Sepsis Score *See Row Information* Medium  MEWS guidelines implemented *See Row Information* No, previously yellow, continue vital signs every 4 hours  Document  Patient Outcome Other (Comment) (patient baseline)  Progress note created (see row info) Yes  Chronic yellow MEWS d/t heart rate and SBP. No interventions or escalation necessary or performed.

## 2020-03-31 NOTE — Progress Notes (Signed)
Palliative Care Progress Note  Mr. Huie continues to struggle with profound dyspnea. He has an EF of <20%, Recurrent lung cancer s/p chemotherapy on 10/1, prior history of childhood TB with lung necrosis and COPD.  I found him in moderate distress this evening-lying in fetal position in a reclining chair at bedside holding the NRB to his face. He appeared very thin and cachetic, tachypnea, could only speak in short sentences but was able to reposition himself in the chair and communicate with me. He has been thinking about how serious his condition is and what he is facing. He tells me he just wants relief and that he has done enough suffering.  One exam he is extremely wheezy especially on the left side where he is moving almost no air, better on the right but still very shallow and wet sounding. It is remarkable that he has been able to maintain his oxygen saturations given his lung pathology and clinical presentation. He descries severe fatigue and lack of energy.   Assessment:  1. Dyspnea Multifactorial-profound lung dx, cardiopulm dx and lung cancer.There is also a component of anticipatory anxiety.   In order to accurately prognosticate and treat his symptoms will obtain a CT of his lungs w/contrast. Increased O2 requirements but maintaining O2 saturations probably not PE although high risk w/low output HF, imobility, cancer. I suspect this is lung cancer progression.  He has agreed to scheduled morphine- he needs scheduled opioids with titration more than he needs sedation, but given high levels of anxiety will schedule Alprazolam 1mg  qhs with q6 prn doses until we can titrate his opioid for comfort.  Start Morphine 10mg  SL q4 hours with IV morpine dose for uncontrolled symptoms.  Will also add daily decadron since he is wheezing and nebs. May need to increase his diuretics but decadron is less likely to cause fluid retenstion. He has also been on multiple doses of steroids recently and  may have a component of adrenal insufficiency as well.  Will start him on daily Azithromycin for COPD management and possible atypical PNA given immune compromised state with recent cancer tx.  2. Malnutrition: He is very thin and his albumin is very low -between 2.0-2.5. I doubt he is eating very well at baseline.  3. Disposition: He has agreed to hospice care at home (if he can stay with his sister) but is still processing his prognosis. He is high risk for sudden death given EF and pulm dx. He is DNR but ICD is still ON by his choice. He is also dual medicare medicaid so there may be options for LTC w/Hospice but only if we can get his symptoms under control. Ideally he needs a hospice house for symptom management but will await CT for prognostication.   Lane Hacker, DO Palliative Medicine

## 2020-03-31 NOTE — Progress Notes (Signed)
Physical Therapy Treatment Patient Details Name: [name] MRN: [id number] DOB: 17-Feb-1961 Today's Date: 03/31/2020    History of Present Illness [name] is a 59 y.o. male with medical history significant of coronary artery disease status post CABG, ischemic cardiomyopathy with ejection fraction of 20 to 25% status post ICD placement, chronic hypoxemic respiratory failure secondary to underlying COPD-on 4 L of oxygen via nasal cannulae at home, recurrent right lung cancer-currently getting systemic chemotherapy, chemotherapy-induced anemia status post recent PRBC transfusion, prostate cancer presents to emergency department with shortness of breath since 2 weeks.  Admitted with respiratory failure and CHF exacerbation.     PT Comments    Pt seated edge of bed on arrival.  He continues to require NRB mask to maintain SPO2.  Pt did desat on 15L NRB with activity but quickly recovered with seated rest break.  He is very motivated and wants to do more but his respiratory status will not allow it.  Continue to recommend HHPT at this time.     Follow Up Recommendations  Home health PT;Supervision/Assistance - 24 hour     Equipment Recommendations  Other (comment) (rollator)    Recommendations for Other Services       Precautions / Restrictions Precautions Precautions: Fall;Other (comment) Precaution Comments: NRB-15L Restrictions Weight Bearing Restrictions: No    Mobility  Bed Mobility               General bed mobility comments: seated edge of bed on arrival.  Transfers Overall transfer level: Needs assistance Equipment used: 4-wheeled walker Transfers: Sit to/from Stand Sit to Stand: Supervision         General transfer comment: Cues for locking Rollator prior to transition and hand placement  Ambulation/Gait Ambulation/Gait assistance: Supervision Gait Distance (Feet): 60 Feet Assistive device: 4-wheeled walker Gait Pattern/deviations: Step-through  pattern;Decreased stride length     General Gait Details: Pt required cues for pacing.  15L NRB mask.  Pt desaturated to 80% and required return back to room where he quickly recovered to 96% on 15L NRB seated.   Stairs             Wheelchair Mobility    Modified Rankin (Stroke Patients Only)       Balance Overall balance assessment: Mild deficits observed, not formally tested                                          Cognition Arousal/Alertness: Awake/alert Behavior During Therapy: WFL for tasks assessed/performed Overall Cognitive Status: Within Functional Limits for tasks assessed                                        Exercises      General Comments        Pertinent Vitals/Pain Pain Assessment: Faces Faces Pain Scale: Hurts little more Pain Location: back Pain Descriptors / Indicators: Aching;Grimacing;Guarding (applied k pad.) Pain Intervention(s): Monitored during session;Repositioned    Home Living                      Prior Function            PT Goals (current goals can now be found in the care plan section) Acute Rehab PT Goals Patient Stated Goal: to be able to breathe  and do more. Potential to Achieve Goals: Good Progress towards PT goals: Progressing toward goals    Frequency    Min 3X/week      PT Plan Current plan remains appropriate    Co-evaluation              AM-PAC PT "6 Clicks" Mobility   Outcome Measure  Help needed turning from your back to your side while in a flat bed without using bedrails?: None Help needed moving from lying on your back to sitting on the side of a flat bed without using bedrails?: None Help needed moving to and from a bed to a chair (including a wheelchair)?: None Help needed standing up from a chair using your arms (e.g., wheelchair or bedside chair)?: None Help needed to walk in hospital room?: None Help needed climbing 3-5 steps with a railing?  : A Little 6 Click Score: 23    End of Session Equipment Utilized During Treatment: Gait belt;Oxygen Activity Tolerance: Patient tolerated treatment well Patient left: in chair;with call bell/phone within reach;with chair alarm set Nurse Communication: Mobility status PT Visit Diagnosis: Muscle weakness (generalized) (M62.81)     Time: 1219-7588 PT Time Calculation (min) (ACUTE ONLY): 20 min  Charges:  $Gait Training: 8-22 mins                     Erasmo Leventhal , PTA Acute Rehabilitation Services Pager [phone] Office [phone]     Nadalyn Deringer Eli Hose 03/31/2020, 4:04 PM

## 2020-03-31 NOTE — Evaluation (Signed)
Occupational Therapy Evaluation Patient Details Name: [name] MRN: [id number] DOB: 04-21-1961 Today's Date: 03/31/2020    History of Present Illness Romolo Sieling is a 59 y.o. male with medical history significant of coronary artery disease status post CABG, ischemic cardiomyopathy with ejection fraction of 20 to 25% status post ICD placement, chronic hypoxemic respiratory failure secondary to underlying COPD-on 4 L of oxygen via nasal cannulae at home, recurrent right lung cancer-currently getting systemic chemotherapy, chemotherapy-induced anemia status post recent PRBC transfusion, prostate cancer presents to emergency department with shortness of breath since 2 weeks.  Admitted with respiratory failure and CHF exacerbation.    Clinical Impression   Pt. Has decreased I and safety with ADLs and ADL transfers. Pt. Has decreased tolerance for activity and is on O2. Pt5. Needs further ed on energy conservation. Pt. States he was having difficulty with performing  home management   tasks. Pt. States he will go stay with his dtr until he gets his strength back. Pt. Will need further ed on energy conservation. Pt. Will be followed by acute ot.   Follow Up Recommendations  Home health OT    Equipment Recommendations  Tub/shower seat;3 in 1 bedside commode    Recommendations for Other Services       Precautions / Restrictions Precautions Precautions: Fall;Other (comment) Precaution Comments: NRB Restrictions Weight Bearing Restrictions: No      Mobility Bed Mobility Overal bed mobility: Independent             General bed mobility comments: OOB in chair    Transfers Overall transfer level: Needs assistance Equipment used: 4-wheeled walker Transfers: Sit to/from Stand Sit to Stand: Supervision Stand pivot transfers: Supervision       General transfer comment: Cues for locking Rollator prior to transition and hand placement    Balance Overall balance assessment:  Mild deficits observed, not formally tested                                         ADL either performed or assessed with clinical judgement   ADL Overall ADL's : Needs assistance/impaired Eating/Feeding: Independent   Grooming: Wash/dry hands;Wash/dry face;Sitting;Set up   Upper Body Bathing: Supervision/ safety;Set up;Sitting   Lower Body Bathing: Minimal assistance;Sit to/from stand   Upper Body Dressing : Minimal assistance;Sitting   Lower Body Dressing: Minimal assistance;Sit to/from stand   Toilet Transfer: Min guard;BSC   Toileting- Water quality scientist and Hygiene: Supervision/safety       Functional mobility during ADLs: Min guard General ADL Comments: Pt. limited by decreased endurance.      Vision Baseline Vision/History: No visual deficits Vision Assessment?: No apparent visual deficits     Perception     Praxis      Pertinent Vitals/Pain Pain Assessment: No/denies pain     Hand Dominance Right   Extremity/Trunk Assessment Upper Extremity Assessment Upper Extremity Assessment: Generalized weakness   Lower Extremity Assessment Lower Extremity Assessment: Defer to PT evaluation   Cervical / Trunk Assessment Cervical / Trunk Assessment: Normal   Communication Communication Communication: No difficulties   Cognition Arousal/Alertness: Awake/alert Behavior During Therapy: WFL for tasks assessed/performed Overall Cognitive Status: Within Functional Limits for tasks assessed                                     General  Comments       Exercises     Shoulder Instructions      Home Living Family/patient expects to be discharged to:: Private residence Living Arrangements: Alone Available Help at Discharge: Family (pt. states he will go stay with dtr until he feels better) Type of Home: Apartment Home Access: Level entry     Home Layout: One level     Bathroom Shower/Tub: Engineer, site: Standard     Home Equipment:  (on home o2)          Prior Functioning/Environment Level of Independence: Independent        Comments: Pt. states he has difficulty with cooking and cleaning.         OT Problem List: Decreased activity tolerance;Cardiopulmonary status limiting activity      OT Treatment/Interventions: Self-care/ADL training;Energy conservation;DME and/or AE instruction;Therapeutic activities;Patient/family education    OT Goals(Current goals can be found in the care plan section) Acute Rehab OT Goals Patient Stated Goal: to be able to breath OT Goal Formulation: With patient Time For Goal Achievement: 04/14/20 Potential to Achieve Goals: Good ADL Goals Pt Will Perform Grooming: Independently;standing Pt Will Perform Lower Body Bathing: with modified independence;sit to/from stand Pt Will Perform Lower Body Dressing: with modified independence;sit to/from stand Pt Will Transfer to Toilet: with modified independence Pt Will Perform Toileting - Clothing Manipulation and hygiene: with modified independence;sit to/from stand Pt Will Perform Tub/Shower Transfer: with modified independence;ambulating  OT Frequency: Min 2X/week   Barriers to D/C: Decreased caregiver support (Pt. states he will go stay with his daughter)          Co-evaluation              AM-PAC OT "6 Clicks" Daily Activity     Outcome Measure Help from another person eating meals?: None Help from another person taking care of personal grooming?: A Little Help from another person toileting, which includes using toliet, bedpan, or urinal?: A Little Help from another person bathing (including washing, rinsing, drying)?: A Little Help from another person to put on and taking off regular upper body clothing?: A Little Help from another person to put on and taking off regular lower body clothing?: A Little 6 Click Score: 19   End of Session Equipment Utilized During Treatment: Gait  belt;Rolling walker;Oxygen Nurse Communication:  (ok therapy)  Activity Tolerance: Other (comment) (pt. limited by sob) Patient left: in bed (pt. refused to sit in chair.)  OT Visit Diagnosis: Muscle weakness (generalized) (M62.81)                Time: 7867-6720 OT Time Calculation (min): 41 min Charges:  OT General Charges $OT Visit: 1 Visit OT Evaluation $OT Eval Moderate Complexity: 1 Mod OT Treatments $Self Care/Home Management : 8-22 mins  Reece Packer OT/L   Brock Mokry 03/31/2020, 12:35 PM

## 2020-04-01 DIAGNOSIS — J438 Other emphysema: Secondary | ICD-10-CM | POA: Diagnosis not present

## 2020-04-01 DIAGNOSIS — I5043 Acute on chronic combined systolic (congestive) and diastolic (congestive) heart failure: Secondary | ICD-10-CM | POA: Diagnosis not present

## 2020-04-01 DIAGNOSIS — E43 Unspecified severe protein-calorie malnutrition: Secondary | ICD-10-CM

## 2020-04-01 DIAGNOSIS — I255 Ischemic cardiomyopathy: Secondary | ICD-10-CM | POA: Diagnosis not present

## 2020-04-01 DIAGNOSIS — E46 Unspecified protein-calorie malnutrition: Secondary | ICD-10-CM

## 2020-04-01 DIAGNOSIS — I25810 Atherosclerosis of coronary artery bypass graft(s) without angina pectoris: Secondary | ICD-10-CM | POA: Diagnosis not present

## 2020-04-01 LAB — CREATININE, SERUM
Creatinine, Ser: 1.14 mg/dL (ref 0.61–1.24)
GFR, Estimated: >60 mL/min (ref >60)

## 2020-04-01 LAB — BRAIN NATRIURETIC PEPTIDE: B Natriuretic Peptide: 4414 pg/mL — ABNORMAL HIGH (ref 0.0–100.0)

## 2020-04-01 MED ORDER — FUROSEMIDE 10 MG/ML IJ SOLN
60.0000 mg | Freq: Three times a day (TID) | INTRAMUSCULAR | Status: DC
  Administered 2020-04-01 – 2020-04-02 (×5): 60 mg via INTRAVENOUS
  Filled 2020-04-01 (×5): qty 6

## 2020-04-01 MED ORDER — SENNOSIDES-DOCUSATE SODIUM 8.6-50 MG PO TABS
1.0000 | ORAL_TABLET | Freq: Two times a day (BID) | ORAL | Status: DC
  Administered 2020-04-02 – 2020-04-10 (×13): 1 via ORAL
  Filled 2020-04-01 (×16): qty 1

## 2020-04-01 NOTE — Progress Notes (Signed)
Patient oxygen sat 91-92%/8L. Will continue to monitor.

## 2020-04-01 NOTE — Progress Notes (Signed)
[facility] PROGRESS NOTE    Progress Note  [name]  YWV:[id number] DOB: 10/17/1960 DOA: 03/18/2020 PCP: [name], MD     Brief Narrative:   [name] is an 59 y.o. male past medical history significant for coronary artery disease status post CABG, ischemic cardiomyopathy with an EF of 20% status post AICD placement, chronic hypoxic respiratory failure secondary to COPD on 4 L of oxygen, stage Ia non-small cell cancer diagnosed in February 2015 status post right lower lobe wedge resection as well as SBRT with a recurrent disease to the right lower lobe in June 2019 which has been in observation since then. Stage T1 adenocarcinoma of the prostate with a Gleason score of 4 diagnosed in November 2019 with a confirmed PET scan and biopsy of the right lower mass consistent with adenocarcinoma currently on chemotherapy, chemotherapy-induced anemia status post transfusion presents to the emergency room with shortness of breath for 2 weeks associated with lower extremity edema, orthopnea PND and a 20 pound weight gain, BNP of 2700 bilateral pleural effusions with pulmonary edema.  Assessment/Plan:   Acute on chronic combined systolic and diastolic CHF (congestive heart failure) ([place]): His baseline oxygen requirement is 4 to 5 L.  He barely walk 50 feet without becoming short of breath he is not a candidate for any further chemotherapy Palliative Care was consulted, he declined hospice and wanted to go home, but when he was wheeled to the car he started feeling short of breath again chest x-ray suggested lung opacity, but no fever or leukocytosis. A CT of the chest revealed diffuse groundglass pulmonary opacities likely due to progressive adenocarcinoma of the lung superimposed on emphysematous changes. We will go ahead and increase his Lasix to see if it gives him some relief. He would like to go home with hospice he will not want to go to a residential facility. Continue morphine  and Decadron appreciate Perative care assistance.  Coronary artery disease: Currently chest pain-free. Continue aspirin and statins.  Essential hypertension: Blood pressure seems to be fairly controlled, continue metoprolol and Lasix.  Hyperlipidemia: Continue statins.  Adenocarcinoma of the right lung: Follow-up with Dr. [name] for chemotherapy as an outpatient.  COPD (chronic obstructive pulmonary disease) ([place]): Can continue inhalers currently no wheezing.  Depression/anxiety: Continue Wellbutrin.    DVT prophylaxis: lovenOX Family Communication:none Status is: Inpatient  Remains inpatient appropriate because:Hemodynamically unstable   Dispo: The patient is from: Home              Anticipated d/c is to: Home              Anticipated d/c date is: 2 days              Patient currently is not medically stable to d/c.  Home with hospice.      Code Status:     Code Status Orders  (From admission, onward)         Start     Ordered   03/18/20 1126  Full code  Continuous        03/18/20 1126        Code Status History    Date Active Date Inactive Code Status Order ID Comments User Context   12/12/2018 2034 12/14/2018 1518 Full Code [id number]  Garrett Bulls, MD Inpatient   10/24/2017 0140 10/26/2017 1934 Full Code [id number]  [name], MD ED   03/23/2017 0429 03/26/2017 1710 Full Code [id number]  Garrett Patience, MD ED   06/15/2015  9509 06/17/2015 1854 Full Code [id number]  Garrett Milan, MD ED   08/07/2013 1900 08/16/2013 1737 Full Code [id number]  [name] Inpatient   07/18/2013 1253 07/19/2013 0343 Full Code [id number]  Garrett Cadet, MD [facility]   Advance Care Planning Activity        IV Access:    Peripheral IV   Procedures and diagnostic studies:   CT ANGIO CHEST PE W OR WO CONTRAST  Result Date: 03/30/2020 CLINICAL DATA:  Pulmonary fibrosis, lung cancer, dyspnea EXAM: CT ANGIOGRAPHY CHEST WITH CONTRAST TECHNIQUE:  Multidetector CT imaging of the chest was performed using the standard protocol during bolus administration of intravenous contrast. Multiplanar CT image reconstructions and MIPs were obtained to evaluate the vascular anatomy. CONTRAST:  179mL OMNIPAQUE IOHEXOL 350 MG/ML SOLN COMPARISON:  01/06/2020 FINDINGS: Cardiovascular: There is excellent opacification of the pulmonary arterial tree. There is no intraluminal filling defect identified to suggest acute pulmonary embolism. The central pulmonary arteries are enlarged in keeping with changes of pulmonary arterial hypertension. Coronary artery bypass grafting has been performed with stenting within a left anterior descending saphenous vein graft. There is mild global cardiomegaly with moderate left ventricular dilation and superimposed mild left ventricular hypertrophy. No pericardial effusion. Left subclavian single lead pacemaker is seen with its lead within the right ventricle. Right internal jugular chest port tip is seen within the right atrium. The thoracic aorta demonstrates mild atherosclerotic calcification, but is otherwise unremarkable. Mediastinum/Nodes: Thyroid unremarkable. Pathologic pre-vascular mediastinal adenopathy is again identified exact measurement is difficult due to streak artifact from overlying pacemaker, however, the index lymph node demonstrates progressive enlargement, measuring 2.0 x 2.8 cm at axial image # 47/5. Right hilar adenopathy has progressed with the index lymph node within the right hilum measuring 1.7 x 2.1 cm at axial image # 63/5. thyroid unremarkable. Esophagus unremarkable. Lungs/Pleura: Severe, end-stage cystic lung disease within the left apex with associated left-sided volume loss is again identified with probable 5 cm aspergillosis a again identified at the left apex. Moderate severe centrilobular paraseptal emphysema. There is interval development of diffuse ground-glass pulmonary infiltrate throughout the residual  pulmonary parenchyma and new consolidation at the right lung base subpleurally, likely infectious in the appropriate clinical setting. Alternatively, diffuse infiltrate could relate to pulmonary edema with consolidation at the right lung base representing superimposed infection should or changes of radiation pneumonitis in the appropriate clinical setting. Pleural base mass at the right lung base demonstrates interval increase in size measuring 3.7 x 4.4 cm at axial image # 99/11 Upper Abdomen: Extensive reflux of contrast into the hepatic venous system secondary to at least some degree of right heart failure. No acute abnormality. Musculoskeletal: No acute bone abnormality. Review of the MIP images confirms the above findings. IMPRESSION: Interval development of diffuse ground-glass pulmonary infiltrate and right basilar subpleural consolidation. This may relate to diffuse infection or, more likely, diffuse pulmonary edema with superimposed infectious or post radiation change at the right lung base. 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 Superimposed moderate emphysema. Interval disease progression with enlargement of the neoplastic mass at the right lung base and developing right hilar adenopathy. Progressive pre-vascular adenopathy of unclear etiology. Moderate left ventricular dilation. Reflux of contrast into the hepatic venous vasculature in keeping with at least some degree of right heart failure. No pulmonary embolism. Aortic Atherosclerosis (ICD10-I70.0) and Emphysema (ICD10-J43.9). Electronically Signed   By: Fidela Salisbury MD   On: 03/30/2020 22:33     Medical Consultants:  None.  Anti-Infectives:   none  Subjective:    [name] relate he wants to get out of here.  Objective:    Vitals:   04/01/20 0800 04/01/20 0811 04/01/20 0825 04/01/20 1120  BP: 113/77   103/70  Pulse:    95  Resp: 20 (!) 24  (!) 21  Temp: 98.6 F (37 C)   (!) 97.3  F (36.3 C)  TempSrc: Oral   Oral  SpO2:   100% 99%  Weight:      Height:       SpO2: 99 % O2 Flow Rate (L/min): 15 L/min FiO2 (%): 100 %   Intake/Output Summary (Last 24 hours) at 04/01/2020 1206 Last data filed at 04/01/2020 0624 Gross per 24 hour  Intake 340 ml  Output 475 ml  Net -135 ml   Filed Weights   03/31/20 0022 04/01/20 0616  Weight: 55.8 kg 54.4 kg    Exam: General exam: In no acute distress. Respiratory system: Good air movement and clear to auscultation. Cardiovascular system: S1 & S2 heard, RRR. No JVD. Gastrointestinal system: Abdomen is nondistended, soft and nontender.  Extremities: No pedal edema. Skin: No rashes, lesions or ulcers  Data Reviewed:    Labs: Basic Metabolic Panel: Recent Labs  Lab 03/26/20 0229 03/26/20 0229 03/27/20 0335 03/27/20 0335 03/28/20 0552 03/31/20 0558 04/01/20 0615  NA 135  --  136  --  139 136  --   K 4.6   < > 4.4   < > 4.1 5.2*  --   CL 97*  --  97*  --  97* 92*  --   CO2 29  --  29  --  34* 36*  --   GLUCOSE 112*  --  117*  --  102* 162*  --   BUN 20  --  22*  --  20 21*  --   CREATININE 0.99  --  0.91  --  0.91 0.85 1.14  CALCIUM 9.1  --  9.0  --  9.4 9.5  --    < > = values in this interval not displayed.   GFR Estimated Creatinine Clearance: 55 mL/min (by C-G formula based on SCr of 1.14 mg/dL). Liver Function Tests: Recent Labs  Lab 03/31/20 0558  AST 32  ALT 38  ALKPHOS 68  BILITOT 0.5  PROT 7.4  ALBUMIN 2.7*   No results for input(s): LIPASE, AMYLASE in the last 168 hours. No results for input(s): AMMONIA in the last 168 hours. Coagulation profile No results for input(s): INR, PROTIME in the last 168 hours. COVID-19 Labs  No results for input(s): DDIMER, FERRITIN, LDH, CRP in the last 72 hours.  Lab Results  Component Value Date   SARSCOV2NAA NEGATIVE 03/18/2020   SARSCOV2NAA NOT DETECTED 03/01/2019   [place] NEGATIVE 12/12/2018   SARSCOV2NAA NOT DETECTED 10/19/2018     CBC: Recent Labs  Lab 03/26/20 0229 03/31/20 0558  WBC  --  5.7  HGB 9.1* 8.7*  HCT 30.0* 28.4*  MCV  --  95.9  PLT  --  464*   Cardiac Enzymes: No results for input(s): CKTOTAL, CKMB, CKMBINDEX, TROPONINI in the last 168 hours. BNP (last 3 results) No results for input(s): PROBNP in the last 8760 hours. CBG: No results for input(s): GLUCAP in the last 168 hours. D-Dimer: No results for input(s): DDIMER in the last 72 hours. Hgb A1c: No results for input(s): HGBA1C in the last 72 hours. Lipid Profile: No results for input(s): CHOL,  HDL, LDLCALC, TRIG, CHOLHDL, LDLDIRECT in the last 72 hours. Thyroid function studies: No results for input(s): TSH, T4TOTAL, T3FREE, THYROIDAB in the last 72 hours.  Invalid input(s): FREET3 Anemia work up: No results for input(s): VITAMINB12, FOLATE, FERRITIN, TIBC, IRON, RETICCTPCT in the last 72 hours. Sepsis Labs: Recent Labs  Lab 03/31/20 0558  WBC 5.7   Microbiology No results found for this or any previous visit (from the past 240 hour(s)).   Medications:   . acetaminophen  1,000 mg Oral TID  . ALPRAZolam  1 mg Oral QHS  . aspirin EC  81 mg Oral Daily  . atorvastatin  40 mg Oral Daily  . azithromycin  250 mg Oral Daily  . buPROPion  150 mg Oral Daily  . Chlorhexidine Gluconate Cloth  6 each Topical Daily  . dexamethasone (DECADRON) injection  8 mg Intravenous Q24H  . diclofenac Sodium  4 g Topical Daily  . enoxaparin (LOVENOX) injection  40 mg Subcutaneous Q24H  . folic acid  1 mg Oral Daily  . furosemide  40 mg Intravenous BID  . lidocaine  1 patch Transdermal Q24H  . metoprolol succinate  12.5 mg Oral Daily  . mometasone-formoterol  2 puff Inhalation BID  . morphine CONCENTRATE  10 mg Oral Q4H  . sodium chloride flush  10-40 mL Intracatheter Q12H  . sodium chloride flush  3 mL Intravenous Q12H  . umeclidinium bromide  1 puff Inhalation Daily   Continuous Infusions: . sodium chloride        LOS: 14 days    Charlynne Cousins  [facility]  04/01/2020, 12:06 PM

## 2020-04-01 NOTE — Progress Notes (Signed)
Manufacturing engineer [facility]) Community Based Palliative Care       Received a referral from [facility] for hospice after discharge.  After review of chart unclear whether patient wishes to continue to treat cancer with chemotherapy.    [facility] hospital liaisons will continue to follow.  If chemotherapy is still desired outpatient palliative care might be appropriate.  If no chemotherapy is continued hospice services to support pt at home or in a long term care environment may be more appropriate.    Thank you for the opportunity to participate in this patient's care.     Domenic Moras, BSN, RN [facility] Liaison   [phone]

## 2020-04-01 NOTE — Progress Notes (Signed)
This chaplain is present bedside for F/U spiritual care with the Pt. The Pt. is sitting in bedside recliner with breathing assistance. The Pt. eyes remain closed as the Pt. responds appropriately in conversation with the chaplain.   The Pt. shares with the chaplain he has learned "he is not getting any better."  With the Pt. statement, the chaplain learns the Pt. anticipates going to his daughter-Stephanie's home. [name] is coordinating the arrival of equipment at her home.  The Pt. shares [name] has experience caring for her mother.   The chaplain understands the Pt. is familiar with Hospice and prefers not to talk about EOL. The Pt. prefers to "take one day at a time" and is open to F/U spiritual care and chaplain prayer.

## 2020-04-01 NOTE — TOC Progression Note (Addendum)
Transition of Care [facility]) - Progression Note    Patient Details  Name: [name] MRN: [id number] Date of Birth: [birth year]  Transition of Care [facility]) CM/SW Contact  Zenon Mayo, RN Phone Number: 04/01/2020, 2:18 PM  Clinical Narrative:    NCM spoke with patient, Staff RN told progression Nurse that patient wants [facility], NCM went into talk to patient to inform him what [facility] is, he states no he does not want [facility] he wants to go to his daughter's house in [place] with [place], he states he would like [facility]. This NCM will make referral to [facility].  15:00- NCM spoke with Daughter , [name], she states it is not an option for patient to come to her home ( [address], [place] [address])  and  patient sister can not take care of him at this time either.  The patient does have [place] Medicaid, I asked patient if he was ok with going to SNF under his Medicaid with Hospice, he states yes, NCM informed him this is the only option we have at this time.  He states he agrees to going to a SNF here in [place].He states he wants  to continue the chemo therapy if this is going to help him, he wants to do snf with palliative services so he can continue the chemo. then he said he will let me know at 5 pm today if his cousin and aunt , Cathlyn Parsons and Barbaraann Share will be able to care for him in [place]. Patient states if he goes to SNF he is ok with the SNF receiving his check. Patient is on 15 liters right now and will need to be weaned down before going to SNF.  Per patient , informed staff RN that aunt and cousin will not be able to take care of him in [place].     Expected Discharge Plan: [place] Barriers to Discharge:  (does not have his key to his oxygen, friend has the key)  Expected Discharge Plan and Services Expected Discharge Plan: [place]   Discharge Planning Services: CM Consult Post Acute Care Choice:  Home Health, Durable Medical Equipment Living arrangements for the past 2 months: Apartment Expected Discharge Date: 03/28/20               DME Arranged: Gilford Rile rolling with seat DME Agency: [employer] Date DME Agency Contacted: 03/28/20 Time DME Agency Contacted: [phone] Representative spoke with at DME Agency: Jodell Cipro HH Arranged: RN, PT, OT [facility] Agency: [facility] Date [place]: 03/28/20 Time [place]: 1608 Representative spoke with at [place]: [place] (SDOH) Interventions Food Insecurity Interventions: Intervention Not Indicated Financial Strain Interventions: Intervention Not Indicated Transportation Interventions: [facility]  Readmission Risk Interventions Readmission Risk Prevention Plan 03/28/2020  Transportation Screening Complete  HRI or Home Care Consult Complete  Social Work Consult for [place] Planning/Counseling Complete  Palliative Care Screening Not Applicable  Medication Review Press photographer) Complete  Some recent data might be hidden

## 2020-04-01 NOTE — Progress Notes (Addendum)
Palliative Care Progress Note  Patient has declined since hospitalization. He is much more somnolent and fatigued on my visit. He has received 2 PRN doses of morphine today. He does not appear to be in as much respiratory distress but may be having CO2 retention and drowsiness related to this. His pupils are normal and no focal neuro signs. He falls asleep during our conversation- he has trouble remembering parts of the conversation. We talked about the findings on CT of worsening lung cancer despite chemotherapy, he has a very weak heart and nearly obliterated lung tissue from panlobar/cystic COPD. The last note by Dr. [name] stated that chemotherapy was put on hold due to declining functional status an intolerability. His functional status has gotten even worse and his cancer too -so he would not based on standards of care and best practice be a candidate for administration of chemotherapy. Dr. [name] planned to follow up with him on 11/8 as an outpatient. Given his current condition I doubt he will be able to make that appointment. At this time I believe it is safe to assume that no additional chemotherapy will be offered, but I will confirm this with Dr. [name].  [name] is processing his prognostic information. He does not want to be in a facility. He wants to be at home. He is open to hospice care at home-he describes going to his house not his daughters house as discussed previously. We need to get clarity on his supports and available resources. His family lives in [place].  I am concerned that he his declining quickly and that he may be transitioning sooner than expected. He is extremely withdrawn and exhausted today. Will need to notify family of these changes.  Garrett Hacker, DO Palliative Medicine

## 2020-04-01 NOTE — Progress Notes (Signed)
Occupational Therapy Treatment Patient Details Name: [name] MRN: [id number] DOB: March 22, 1961 Today's Date: 04/01/2020    History of present illness [name] is a 59 y.o. male with medical history significant of coronary artery disease status post CABG, ischemic cardiomyopathy with ejection fraction of 20 to 25% status post ICD placement, chronic hypoxemic respiratory failure secondary to underlying COPD-on 4 L of oxygen via nasal cannulae at home, recurrent right lung cancer-currently getting systemic chemotherapy, chemotherapy-induced anemia status post recent PRBC transfusion, prostate cancer presents to emergency department with shortness of breath since 2 weeks.  Admitted with respiratory failure and CHF exacerbation.    OT comments  Pt eager to get up to sink for grooming. Min guard assist required for pt to get from recliner to 3 in 1 at sink. Requires increased time to complete grooming due to need to stop and replace NRB. Pt educated in energy conservation and given written handout to reinforce.  Washed out pt's soiled underwear and placed on grab bar in bathroom to dry.   Follow Up Recommendations  Home health OT    Equipment Recommendations  Tub/shower seat;3 in 1 bedside commode    Recommendations for Other Services      Precautions / Restrictions Precautions Precautions: Fall;Other (comment) Precaution Comments: NRB-15L       Mobility Bed Mobility               General bed mobility comments: pt received in chair  Transfers Overall transfer level: Needs assistance Equipment used: None Transfers: Sit to/from Stand Sit to Stand: Min guard Stand pivot transfers: Min guard            Balance Overall balance assessment: Mild deficits observed, not formally tested                                         ADL either performed or assessed with clinical judgement   ADL Overall ADL's : Needs assistance/impaired     Grooming:  Wash/dry hands;Wash/dry face;Oral care;Brushing hair;Sitting;Supervision/safety Grooming Details (indicate cue type and reason): increased time required due to need to replace NRB between activities                 Toilet Transfer: Min guard (stood to use urinal)   Toileting- Water quality scientist and Hygiene: Supervision/safety         General ADL Comments: Educated in energy conservation strategies and provided written handout.     Vision       Perception     Praxis      Cognition Arousal/Alertness: Awake/alert Behavior During Therapy: Flat affect Overall Cognitive Status: Within Functional Limits for tasks assessed                                          Exercises     Shoulder Instructions       General Comments      Pertinent Vitals/ Pain       Pain Assessment: Faces Faces Pain Scale: Hurts little more Pain Location: back Pain Descriptors / Indicators: Sore Pain Intervention(s): Heat applied;Repositioned;Monitored during session  Home Living  Prior Functioning/Environment              Frequency  Min 2X/week        Progress Toward Goals  OT Goals(current goals can now be found in the care plan section)  Progress towards OT goals: Progressing toward goals  Acute Rehab OT Goals Patient Stated Goal: to be able to breathe and do more. OT Goal Formulation: With patient Time For Goal Achievement: 04/14/20 Potential to Achieve Goals: Good  Plan Discharge plan remains appropriate    Co-evaluation                 AM-PAC OT "6 Clicks" Daily Activity     Outcome Measure   Help from another person eating meals?: None Help from another person taking care of personal grooming?: A Little Help from another person toileting, which includes using toliet, bedpan, or urinal?: A Little Help from another person bathing (including washing, rinsing, drying)?: A  Little Help from another person to put on and taking off regular upper body clothing?: A Little Help from another person to put on and taking off regular lower body clothing?: A Little 6 Click Score: 19    End of Session Equipment Utilized During Treatment: Oxygen  OT Visit Diagnosis: Muscle weakness (generalized) (M62.81)   Activity Tolerance Patient limited by fatigue   Patient Left in chair;with call bell/phone within reach;with chair alarm set   Nurse Communication          Time: [phone] OT Time Calculation (min): 23 min  Charges: OT General Charges $OT Visit: 1 Visit OT Treatments $Self Care/Home Management : 23-37 mins  Nestor Lewandowsky, OTR/L Acute Rehabilitation Services Pager: [phone] Office: [phone]   [name] 04/01/2020, 10:24 AM
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
## 2020-04-02 ENCOUNTER — Other Ambulatory Visit: Payer: Self-pay | Admitting: Radiation Therapy

## 2020-04-02 DIAGNOSIS — J9621 Acute and chronic respiratory failure with hypoxia: Secondary | ICD-10-CM | POA: Diagnosis not present

## 2020-04-02 DIAGNOSIS — I255 Ischemic cardiomyopathy: Secondary | ICD-10-CM | POA: Diagnosis not present

## 2020-04-02 DIAGNOSIS — I25810 Atherosclerosis of coronary artery bypass graft(s) without angina pectoris: Secondary | ICD-10-CM | POA: Diagnosis not present

## 2020-04-02 DIAGNOSIS — I5043 Acute on chronic combined systolic (congestive) and diastolic (congestive) heart failure: Secondary | ICD-10-CM | POA: Diagnosis not present

## 2020-04-02 LAB — BASIC METABOLIC PANEL
Anion gap: 7 (ref 5–15)
BUN: 37 mg/dL — ABNORMAL HIGH (ref 6–20)
CO2: 35 mmol/L — ABNORMAL HIGH (ref 22–32)
Calcium: 7.9 mg/dL — ABNORMAL LOW (ref 8.9–10.3)
Chloride: 97 mmol/L — ABNORMAL LOW (ref 98–111)
Creatinine, Ser: 0.86 mg/dL (ref 0.61–1.24)
GFR, Estimated: >60 mL/min (ref >60)
Glucose, Bld: 104 mg/dL — ABNORMAL HIGH (ref 70–99)
Potassium: 4.2 mmol/L (ref 3.5–5.1)
Sodium: 139 mmol/L (ref 135–145)

## 2020-04-02 MED ORDER — SODIUM POLYSTYRENE SULFONATE 15 GM/60ML PO SUSP: 30.0000 g | Freq: Once | ORAL | Status: DC

## 2020-04-02 NOTE — Progress Notes (Signed)
[facility]) Hospital Liaison Note  This patient has been referred for hospice services after discharge.  [facility] hospital liaisons will continue to follow for any discharge planning needs and to coordinate admission onto hospice services.    If you have questions or need assistance, please contact the hospital Liaison listed on AMION.     Thank you for the opportunity to participate in this patient's care.     Domenic Moras, BSN, RN [facility] Liaison   [phone]

## 2020-04-02 NOTE — TOC Progression Note (Signed)
Transition of Care [facility]) - Progression Note    Patient Details  Name: [name] MRN: [id number] Date of Birth: Dec 06, 1960  Transition of Care [facility]) CM/SW Contact  Sharlet Salina Mila Homer, LCSW Phone Number: 04/02/2020, 4:15 PM  Clinical Narrative:  Visited with patient to discuss his discharge disposition. [name] was sitting in a chair napping, but awakened easily when his name was called. Patient explained that he has an apartment in [place] and one in [place]: [place], and that he lives with a friend in this apartment. His plan is to move back to [facility] where he has family (mother and aunt). Patient clarified that he lived in [place] in 2009. He does want a long-term care bed in a nursing facility in [place]. When asked, patient responded that he has a daughter [name], who lives in [place]. [name] gave CSW permission to contact his mother or aunt if needed.   Patient reported that he has had the COVID vaccinations and has his card. Mr. Mackowski was informed that he will updated once a facility is confirmed.    Expected Discharge Plan: [place] Barriers to Discharge:  (does not have his key to his oxygen, friend has the key)  Expected Discharge Plan and Services Expected Discharge Plan: [place]   Discharge Planning Services: CM Consult Post Acute Care Choice: Home Health, Durable Medical Equipment Living arrangements for the past 2 months: Apartment Expected Discharge Date: 03/28/20               DME Arranged: Gilford Rile rolling with seat DME Agency: [employer] Date DME Agency Contacted: 03/28/20 Time DME Agency Contacted: [phone] Representative spoke with at DME Agency: Jodell Cipro HH Arranged: RN, PT, OT [facility] Agency: [facility] Date [place]: 03/28/20 Time [place]: 1608 Representative spoke with at [place]: [place] (SDOH) Interventions Food  Insecurity Interventions: Intervention Not Indicated Financial Strain Interventions: Intervention Not Indicated Transportation Interventions: [facility]  Readmission Risk Interventions Readmission Risk Prevention Plan 03/28/2020  Transportation Screening Complete  HRI or Home Care Consult Complete  Social Work Consult for [place] Planning/Counseling Complete  Palliative Care Screening Not Applicable  Medication Review Press photographer) Complete  Some recent data might be hidden

## 2020-04-02 NOTE — Progress Notes (Signed)
[facility] PROGRESS NOTE    Progress Note  [name]  OFB:[id number] DOB: 1960-12-17 DOA: 03/18/2020 PCP: [name], MD     Brief Narrative:   [name] is an 59 y.o. male past medical history significant for coronary artery disease status post CABG, ischemic cardiomyopathy with an EF of 20% status post AICD placement, chronic hypoxic respiratory failure secondary to COPD on 4 L of oxygen, stage Ia non-small cell cancer diagnosed in February 2015 status post right lower lobe wedge resection as well as SBRT with a recurrent disease to the right lower lobe in June 2019 which has been in observation since then. Stage T1 adenocarcinoma of the prostate with a Gleason score of 4 diagnosed in November 2019 with a confirmed PET scan and biopsy of the right lower mass consistent with adenocarcinoma currently on chemotherapy, chemotherapy-induced anemia status post transfusion presents to the emergency room with shortness of breath for 2 weeks associated with lower extremity edema, orthopnea PND and a 20 pound weight gain, BNP of 2700 bilateral pleural effusions with pulmonary edema.  Assessment/Plan:   Acute on chronic combined systolic and diastolic CHF (congestive heart failure) ([place]): His baseline oxygen requirement is 4 to 5 L.  He barely walk 50 feet without becoming short of breath he is not a candidate for any further chemotherapy. He is now requiring 7 L of oxygen to keep saturations greater than 92%. CT scan showed diffuse groundglass opacities likely due to progressive adenocarcinoma superimposed on emphysematous changes. He was started on IV Lasix with no improvement in his oxygen requirements. Palliative care was consulted and he will have to go to skilled nursing facility with hospice care. His prognosis overall is poor. He relates this morning that he does not want to be bothered he will not be left alone.  Having a hard time dealing with the  situation.  Hyperkalemia: On 09/29/2018 will repeat a basic metabolic panel today.  Coronary artery disease: Currently chest pain-free. Continue aspirin and statins.  Essential hypertension: Blood pressure seems to be fairly controlled, continue metoprolol and Lasix.  Hyperlipidemia: Continue statins.  Adenocarcinoma of the right lung: Follow-up with Dr. [name] for chemotherapy as an outpatient.  COPD (chronic obstructive pulmonary disease) ([place]): Can continue inhalers currently no wheezing.  Depression/anxiety: Continue Wellbutrin.    DVT prophylaxis: lovenOX Family Communication:none Status is: Inpatient  Remains inpatient appropriate because:Hemodynamically unstable   Dispo: The patient is from: Home              Anticipated d/c is to: Home              Anticipated d/c date is: 2 days              Patient currently is not medically stable to d/c.  Skilled nursing facility with hospice.  Patient is stable for discharge awaiting skilled nursing facility placement.      Code Status:     Code Status Orders  (From admission, onward)         Start     Ordered   03/18/20 1126  Full code  Continuous        03/18/20 1126        Code Status History    Date Active Date Inactive Code Status Order ID Comments User Context   12/12/2018 2034 12/14/2018 1518 Full Code [id number]  [name], MD Inpatient   10/24/2017 0140 10/26/2017 1934 Full Code [id number]  [name], MD ED   03/23/2017 951 422 0137  03/26/2017 1710 Full Code [id number]  [name], MD ED   06/15/2015 0357 06/17/2015 1854 Full Code [id number]  [name], MD ED   08/07/2013 1900 08/16/2013 1737 Full Code [id number]  [name], PA-C Inpatient   07/18/2013 1253 07/19/2013 0343 Full Code [id number]  [name], MD [facility]   Advance Care Planning Activity        IV Access:    Peripheral IV   Procedures and diagnostic studies:   No results found.   Medical Consultants:     None.  Anti-Infectives:   none  Subjective:    [name] reletes his dyspnea is about the same  Objective:    Vitals:   04/02/20 0000 04/02/20 0400 04/02/20 0453 04/02/20 0739  BP: 106/72 110/77    Pulse: 93 94    Resp: 20 18    Temp: 97.8 F (36.6 C) 97.9 F (36.6 C)    TempSrc: Oral Oral    SpO2: 100% 97%  97%  Weight:  53.5 kg 53.5 kg   Height:       SpO2: 97 % O2 Flow Rate (L/min): 11 L/min FiO2 (%): 100 %   Intake/Output Summary (Last 24 hours) at 04/02/2020 1046 Last data filed at 04/02/2020 0454 Gross per 24 hour  Intake 60 ml  Output 300 ml  Net -240 ml   Filed Weights   04/01/20 0616 04/02/20 0400 04/02/20 0453  Weight: 54.4 kg 53.5 kg 53.5 kg    Exam: General exam: In no acute distress. Respiratory system: Good air movement and clear to auscultation. Cardiovascular system: S1 & S2 heard, RRR. No JVD. Gastrointestinal system: Abdomen is nondistended, soft and nontender.  Extremities: No pedal edema. Skin: No rashes, lesions or ulcers  Data Reviewed:    Labs: Basic Metabolic Panel: Recent Labs  Lab 03/27/20 0335 03/27/20 0335 03/28/20 0552 03/31/20 0558 04/01/20 0615  NA 136  --  139 136  --   K 4.4   < > 4.1 5.2*  --   CL 97*  --  97* 92*  --   CO2 29  --  34* 36*  --   GLUCOSE 117*  --  102* 162*  --   BUN 22*  --  20 21*  --   CREATININE 0.91  --  0.91 0.85 1.14  CALCIUM 9.0  --  9.4 9.5  --    < > = values in this interval not displayed.   GFR Estimated Creatinine Clearance: 54.1 mL/min (by C-G formula based on SCr of 1.14 mg/dL). Liver Function Tests: Recent Labs  Lab 03/31/20 0558  AST 32  ALT 38  ALKPHOS 68  BILITOT 0.5  PROT 7.4  ALBUMIN 2.7*   No results for input(s): LIPASE, AMYLASE in the last 168 hours. No results for input(s): AMMONIA in the last 168 hours. Coagulation profile No results for input(s): INR, PROTIME in the last 168 hours. COVID-19 Labs  No results for input(s): DDIMER, FERRITIN,  LDH, CRP in the last 72 hours.  Lab Results  Component Value Date   SARSCOV2NAA NEGATIVE 03/18/2020   SARSCOV2NAA NOT DETECTED 03/01/2019   [place] NEGATIVE 12/12/2018   SARSCOV2NAA NOT DETECTED 10/19/2018    CBC: Recent Labs  Lab 03/31/20 0558  WBC 5.7  HGB 8.7*  HCT 28.4*  MCV 95.9  PLT 464*   Cardiac Enzymes: No results for input(s): CKTOTAL, CKMB, CKMBINDEX, TROPONINI in the last 168 hours. BNP (last 3 results) No results for input(s): PROBNP in  the last 8760 hours. CBG: No results for input(s): GLUCAP in the last 168 hours. D-Dimer: No results for input(s): DDIMER in the last 72 hours. Hgb A1c: No results for input(s): HGBA1C in the last 72 hours. Lipid Profile: No results for input(s): CHOL, HDL, LDLCALC, TRIG, CHOLHDL, LDLDIRECT in the last 72 hours. Thyroid function studies: No results for input(s): TSH, T4TOTAL, T3FREE, THYROIDAB in the last 72 hours.  Invalid input(s): FREET3 Anemia work up: No results for input(s): VITAMINB12, FOLATE, FERRITIN, TIBC, IRON, RETICCTPCT in the last 72 hours. Sepsis Labs: Recent Labs  Lab 03/31/20 0558  WBC 5.7   Microbiology No results found for this or any previous visit (from the past 240 hour(s)).   Medications:    acetaminophen  1,000 mg Oral TID   ALPRAZolam  1 mg Oral QHS   aspirin EC  81 mg Oral Daily   atorvastatin  40 mg Oral Daily   azithromycin  250 mg Oral Daily   buPROPion  150 mg Oral Daily   Chlorhexidine Gluconate Cloth  6 each Topical Daily   dexamethasone (DECADRON) injection  8 mg Intravenous Q24H   diclofenac Sodium  4 g Topical Daily   enoxaparin (LOVENOX) injection  40 mg Subcutaneous L89Q   folic acid  1 mg Oral Daily   furosemide  60 mg Intravenous TID   lidocaine  1 patch Transdermal Q24H   metoprolol succinate  12.5 mg Oral Daily   mometasone-formoterol  2 puff Inhalation BID   morphine CONCENTRATE  10 mg Oral Q4H   senna-docusate  1 tablet Oral BID   sodium  chloride flush  10-40 mL Intracatheter Q12H   sodium chloride flush  3 mL Intravenous Q12H   sodium polystyrene  30 g Oral Once   umeclidinium bromide  1 puff Inhalation Daily   Continuous Infusions:  sodium chloride        LOS: 15 days   Charlynne Cousins  [facility]  04/02/2020, 10:46 AM

## 2020-04-02 NOTE — Progress Notes (Signed)
Physical Therapy Treatment Patient Details Name: Omarii Scalzo MRN: [id number] DOB: Jul 19, 1960 Today's Date: 04/02/2020    History of Present Illness Kamar Callender is a 59 y.o. male with medical history significant of coronary artery disease status post CABG, ischemic cardiomyopathy with ejection fraction of 20 to 25% status post ICD placement, chronic hypoxemic respiratory failure secondary to underlying COPD-on 4 L of oxygen via nasal cannulae at home, recurrent right lung cancer-currently getting systemic chemotherapy, chemotherapy-induced anemia status post recent PRBC transfusion, prostate cancer presents to emergency department with shortness of breath since 2 weeks.  Admitted with respiratory failure and CHF exacerbation.     PT Comments    Patient on 12L O2 HFNC upon arrival with resting spO2 at 92%. Patient performed sit to stand with min guard and performed standing exercises with spO2 at 88% during activity, with seated rest break spO2 dropped to 73, instructed patient on pursed lip breathing. After 5 minutes, patient able to bring spO2 to 90%. Performed seated exercises with maintaining spO2 at 88% throughout. Encouraged pursed lip breathing with patient able to bring spO2 up to 91% prior to leaving. Patient continues to be limited by cardiopulmonary status, decreased activity tolerance, generalized weakness, impaired balance. Continue to recommend HHPT following discharge to maximize functional mobility.    Follow Up Recommendations  Home health PT;Supervision/Assistance - 24 hour     Equipment Recommendations  Other (comment) (rollator)    Recommendations for Other Services       Precautions / Restrictions Precautions Precautions: Fall;Other (comment) Precaution Comments: HFNC 12 L     Mobility  Bed Mobility Overal bed mobility:  (up in chair on arrival)                Transfers Overall transfer level: Needs assistance Equipment used: None Transfers: Sit to/from  Stand Sit to Stand: Min guard         General transfer comment: O2 at rest 92% on 12L HFNC   Ambulation/Gait                 Stairs             Wheelchair Mobility    Modified Rankin (Stroke Patients Only)       Balance Overall balance assessment: Mild deficits observed, not formally tested                                          Cognition Arousal/Alertness: Awake/alert Behavior During Therapy: Flat affect Overall Cognitive Status: Within Functional Limits for tasks assessed                                        Exercises General Exercises - Lower Extremity Hip Flexion/Marching: Both;10 reps;Standing Toe Raises: Both;10 reps;Seated Heel Raises: Both;10 reps;Seated Mini-Sqauts: 10 reps;Standing    General Comments General comments (skin integrity, edema, etc.): At rest, O2 sats were 92% on 12L HFNC. Following standing exercises, spO2 dropped to 73, instructed patient on pursed lip breathing with 5 minute recovery. Performed seated exercises with spO2 maintaining 88%. encouraged pursed lip breathing with patient returning to 91% prior to departure      Pertinent Vitals/Pain Pain Assessment: No/denies pain    Home Living  Prior Function            PT Goals (current goals can now be found in the care plan section) Acute Rehab PT Goals Patient Stated Goal: to be able to breathe and do more. PT Goal Formulation: With patient Time For Goal Achievement: 04/11/20 Potential to Achieve Goals: Good Progress towards PT goals: Progressing toward goals    Frequency    Min 3X/week      PT Plan Current plan remains appropriate    Co-evaluation              AM-PAC PT "6 Clicks" Mobility   Outcome Measure  Help needed turning from your back to your side while in a flat bed without using bedrails?: None Help needed moving from lying on your back to sitting on the side of a flat  bed without using bedrails?: None Help needed moving to and from a bed to a chair (including a wheelchair)?: None Help needed standing up from a chair using your arms (e.g., wheelchair or bedside chair)?: None Help needed to walk in hospital room?: A Little Help needed climbing 3-5 steps with a railing? : A Little 6 Click Score: 22    End of Session Equipment Utilized During Treatment: Gait belt;Oxygen Activity Tolerance: Patient tolerated treatment well Patient left: in chair;with call bell/phone within reach;with chair alarm set Nurse Communication: Mobility status PT Visit Diagnosis: Muscle weakness (generalized) (M62.81)     Time: 5188-4166 PT Time Calculation (min) (ACUTE ONLY): 24 min  Charges:  $Therapeutic Exercise: 23-37 mins                     Perrin Maltese, PT, DPT Acute Rehabilitation Services Pager [phone] Office [phone]    Melene Plan Allred 04/02/2020, 1:41 PM
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
## 2020-04-03 DIAGNOSIS — C3491 Malignant neoplasm of unspecified part of right bronchus or lung: Secondary | ICD-10-CM

## 2020-04-03 DIAGNOSIS — Z66 Do not resuscitate: Secondary | ICD-10-CM | POA: Diagnosis not present

## 2020-04-03 DIAGNOSIS — I5043 Acute on chronic combined systolic (congestive) and diastolic (congestive) heart failure: Secondary | ICD-10-CM | POA: Diagnosis not present

## 2020-04-03 DIAGNOSIS — I25810 Atherosclerosis of coronary artery bypass graft(s) without angina pectoris: Secondary | ICD-10-CM | POA: Diagnosis not present

## 2020-04-03 DIAGNOSIS — Z95810 Presence of automatic (implantable) cardiac defibrillator: Secondary | ICD-10-CM

## 2020-04-03 LAB — BASIC METABOLIC PANEL
Anion gap: 8 (ref 5–15)
BUN: 45 mg/dL — ABNORMAL HIGH (ref 6–20)
CO2: 38 mmol/L — ABNORMAL HIGH (ref 22–32)
Calcium: 9.7 mg/dL (ref 8.9–10.3)
Chloride: 90 mmol/L — ABNORMAL LOW (ref 98–111)
Creatinine, Ser: 1.09 mg/dL (ref 0.61–1.24)
GFR, Estimated: >60 mL/min (ref >60)
Glucose, Bld: 170 mg/dL — ABNORMAL HIGH (ref 70–99)
Potassium: 5.5 mmol/L — ABNORMAL HIGH (ref 3.5–5.1)
Sodium: 136 mmol/L (ref 135–145)

## 2020-04-03 MED ORDER — SODIUM POLYSTYRENE SULFONATE 15 GM/60ML PO SUSP
15.0000 g | Freq: Two times a day (BID) | ORAL | Status: DC
  Administered 2020-04-03 – 2020-04-06 (×3): 15 g via ORAL
  Filled 2020-04-03 (×5): qty 60

## 2020-04-03 MED ORDER — SODIUM POLYSTYRENE SULFONATE 15 GM/60ML PO SUSP
15.0000 g | Freq: Once | ORAL | Status: AC
  Administered 2020-04-03: 15 g via ORAL
  Filled 2020-04-03: qty 60

## 2020-04-03 NOTE — Progress Notes (Signed)
Called rap to turn off ICD per request on Dr. Gasper Sells. See progress note.

## 2020-04-03 NOTE — Progress Notes (Signed)
Pt refusing morphine and stool softener. Attempted to reeducate pt on the importance of keeping a good bowel movement regimen. Pt did not appear to be receptive to learning at this time.
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
## 2020-04-03 NOTE — Progress Notes (Addendum)
[facility] PROGRESS NOTE    Progress Note  Saben Donigan  YPP:[id number] DOB: 1960-11-29 DOA: 03/18/2020 PCP: [name], MD     Brief Narrative:   [name] is an 59 y.o. male past medical history significant for coronary artery disease status post CABG, ischemic cardiomyopathy with an EF of 20% status post AICD placement, chronic hypoxic respiratory failure secondary to COPD on 4 L of oxygen, stage Ia non-small cell cancer diagnosed in February 2015 status post right lower lobe wedge resection as well as SBRT with a recurrent disease to the right lower lobe in June 2019 which has been in observation since then. Stage T1 adenocarcinoma of the prostate with a Gleason score of 4 diagnosed in November 2019 with a confirmed PET scan and biopsy of the right lower mass consistent with adenocarcinoma currently on chemotherapy, chemotherapy-induced anemia status post transfusion presents to the emergency room with shortness of breath for 2 weeks associated with lower extremity edema, orthopnea PND and a 20 pound weight gain, BNP of 2700 bilateral pleural effusions with pulmonary edema.  He was started on IV Lasix and he diuresed about 7 L he is still requiring 4 to 5 L of oxygen, patient barely walk 50 feet a repeat a CT scan was done that showed progression of his lung cancer.  Assessment/Plan:   Acute on chronic combined systolic and diastolic CHF (congestive heart failure) ([place]): His baseline oxygen requirement is 4 to 5 L.  He barely walk 50 feet without becoming short of breath he is not a candidate for any further chemotherapy. His creatinine is trending up we will change him to oral Lasix  care was consulted and he decided to move towards hospice care at a skilled nursing facility. Overall he has a poor prognosis.  Hyperkalemia: Worse today discontinue Lasix we will give him Kayexalate recheck basic metabolic panel in the morning.  Coronary artery disease: Currently chest  pain-free. Continue aspirin and statins.  Essential hypertension: Blood pressure seems to be fairly controlled, continue metoprolol.  Hyperlipidemia: Continue statins.  Adenocarcinoma of the right lung: Follow-up with Dr. [name] for chemotherapy as an outpatient.  COPD (chronic obstructive pulmonary disease) ([place]): Can continue inhalers currently no wheezing.  Depression/anxiety: Continue Wellbutrin.    DVT prophylaxis: lovenOX Family Communication:none Status is: Inpatient  Remains inpatient appropriate because:Hemodynamically unstable   Dispo: The patient is from: Home              Anticipated d/c is to: Home              Anticipated d/c date is: 2 days              Patient currently is not medically stable to d/c.  Skilled nursing facility with hospice.  Patient is stable for discharge awaiting skilled nursing facility placement.      Code Status:     Code Status Orders  (From admission, onward)         Start     Ordered   03/18/20 1126  Full code  Continuous        03/18/20 1126        Code Status History    Date Active Date Inactive Code Status Order ID Comments User Context   12/12/2018 2034 12/14/2018 1518 Full Code [id number]  [name], MD Inpatient   10/24/2017 0140 10/26/2017 1934 Full Code [id number]  [name], MD ED   03/23/2017 0429 03/26/2017 1710 Full Code [id number]  [name],  MD ED   06/15/2015 0357 06/17/2015 1854 Full Code [id number]  [name], MD ED   08/07/2013 1900 08/16/2013 1737 Full Code [id number]  [name], PA-C Inpatient   07/18/2013 1253 07/19/2013 0343 Full Code [id number]  [name], MD [facility]   Advance Care Planning Activity        IV Access:    Peripheral IV   Procedures and diagnostic studies:   No results found.   Medical Consultants:    None.  Anti-Infectives:   none  Subjective:    Suresh Audi is now about the same no changes.  Objective:    Vitals:    04/03/20 0000 04/03/20 0425 04/03/20 0618 04/03/20 0800  BP: 113/77 103/73  111/83  Pulse:  91  90  Resp: 19 17  20   Temp: 97.7 F (36.5 C) 97.9 F (36.6 C)  97.6 F (36.4 C)  TempSrc: Oral Oral  Oral  SpO2: 99%   96%  Weight:   57.3 kg   Height:       SpO2: 96 % O2 Flow Rate (L/min): 15 L/min FiO2 (%): 100 %   Intake/Output Summary (Last 24 hours) at 04/03/2020 0951 Last data filed at 04/03/2020 0400 Gross per 24 hour  Intake 360 ml  Output 1075 ml  Net -715 ml   Filed Weights   04/02/20 0400 04/02/20 0453 04/03/20 0618  Weight: 53.5 kg 53.5 kg 57.3 kg    Exam: General exam: In no acute distress. Respiratory system: Good air movement and clear to auscultation. Cardiovascular system: S1 & S2 heard, RRR. No JVD. Gastrointestinal system: Abdomen is nondistended, soft and nontender.  Extremities: No pedal edema. Skin: No rashes, lesions or ulcers  Data Reviewed:    Labs: Basic Metabolic Panel: Recent Labs  Lab 03/28/20 0552 03/28/20 0552 03/31/20 0558 03/31/20 0558 04/01/20 0615 04/02/20 1622 04/03/20 0637  NA 139  --  136  --   --  139 136  K 4.1   < > 5.2*   < >  --  4.2 5.5*  CL 97*  --  92*  --   --  97* 90*  CO2 34*  --  36*  --   --  35* 38*  GLUCOSE 102*  --  162*  --   --  104* 170*  BUN 20  --  21*  --   --  37* 45*  CREATININE 0.91  --  0.85  --  1.14 0.86 1.09  CALCIUM 9.4  --  9.5  --   --  7.9* 9.7   < > = values in this interval not displayed.   GFR Estimated Creatinine Clearance: 60.6 mL/min (by C-G formula based on SCr of 1.09 mg/dL). Liver Function Tests: Recent Labs  Lab 03/31/20 0558  AST 32  ALT 38  ALKPHOS 68  BILITOT 0.5  PROT 7.4  ALBUMIN 2.7*   No results for input(s): LIPASE, AMYLASE in the last 168 hours. No results for input(s): AMMONIA in the last 168 hours. Coagulation profile No results for input(s): INR, PROTIME in the last 168 hours. COVID-19 Labs  No results for input(s): DDIMER, FERRITIN, LDH, CRP in the  last 72 hours.  Lab Results  Component Value Date   SARSCOV2NAA NEGATIVE 03/18/2020   SARSCOV2NAA NOT DETECTED 03/01/2019   [place] NEGATIVE 12/12/2018   SARSCOV2NAA NOT DETECTED 10/19/2018    CBC: Recent Labs  Lab 03/31/20 0558  WBC 5.7  HGB 8.7*  HCT 28.4*  MCV 95.9  PLT 464*   Cardiac Enzymes: No results for input(s): CKTOTAL, CKMB, CKMBINDEX, TROPONINI in the last 168 hours. BNP (last 3 results) No results for input(s): PROBNP in the last 8760 hours. CBG: No results for input(s): GLUCAP in the last 168 hours. D-Dimer: No results for input(s): DDIMER in the last 72 hours. Hgb A1c: No results for input(s): HGBA1C in the last 72 hours. Lipid Profile: No results for input(s): CHOL, HDL, LDLCALC, TRIG, CHOLHDL, LDLDIRECT in the last 72 hours. Thyroid function studies: No results for input(s): TSH, T4TOTAL, T3FREE, THYROIDAB in the last 72 hours.  Invalid input(s): FREET3 Anemia work up: No results for input(s): VITAMINB12, FOLATE, FERRITIN, TIBC, IRON, RETICCTPCT in the last 72 hours. Sepsis Labs: Recent Labs  Lab 03/31/20 0558  WBC 5.7   Microbiology No results found for this or any previous visit (from the past 240 hour(s)).   Medications:   . acetaminophen  1,000 mg Oral TID  . ALPRAZolam  1 mg Oral QHS  . aspirin EC  81 mg Oral Daily  . atorvastatin  40 mg Oral Daily  . azithromycin  250 mg Oral Daily  . buPROPion  150 mg Oral Daily  . Chlorhexidine Gluconate Cloth  6 each Topical Daily  . dexamethasone (DECADRON) injection  8 mg Intravenous Q24H  . diclofenac Sodium  4 g Topical Daily  . enoxaparin (LOVENOX) injection  40 mg Subcutaneous Q24H  . folic acid  1 mg Oral Daily  . furosemide  60 mg Intravenous TID  . lidocaine  1 patch Transdermal Q24H  . metoprolol succinate  12.5 mg Oral Daily  . mometasone-formoterol  2 puff Inhalation BID  . morphine CONCENTRATE  10 mg Oral Q4H  . senna-docusate  1 tablet Oral BID  . sodium chloride flush   10-40 mL Intracatheter Q12H  . sodium polystyrene  15 g Oral Once  . umeclidinium bromide  1 puff Inhalation Daily   Continuous Infusions:     LOS: 16 days   Charlynne Cousins  [facility]  04/03/2020, 9:51 AM

## 2020-04-03 NOTE — Progress Notes (Signed)
This chaplain was pastorally present for Pt. F/U spiritual care.  The Pt. has chosen not to eat his lunch and asks for it to be moved from the table.  The Pt. is alert and following the conversation with the chaplain.    The chaplain hears the [place], "my health is not going to get any better." The chaplain listens as the Pt. explores love and not being forgotten as part of his legacy. The chaplain will explore the Pt. choice of a visitor. The Pt. describes his preference to die without pain and shortness of breath.  The Pt. grows tired as we talk.  The Pt. ends the visit with his preference to be included in the chaplain's personal prayer.

## 2020-04-03 NOTE — TOC Progression Note (Signed)
Transition of Care [facility]) - Progression Note    Patient Details  Name: [name] MRN: [id number] Date of Birth: July 17, 1960  Transition of [facility]) CM/SW [place], [place] Phone Number: 04/03/2020, 4:57 PM  Clinical Narrative:    CSW returned phone call to [place], pt's daughter in reference to dc plans. CSW answered pt questions about different options and the difference between short term placement, long term placement, and what hospice to follow means. CSW also explained Medicaid and how that plays a factor into where pt can go. [name] and family are understandably overwhelmed with the decisions that they are faced with and cannot provide the 24 hour care that the pt needs. [name] understands that pt needs to go to SNF and would like pt to be placed close to his mother in [place] but is agreeable if pt can be placed in [place]. CSW reached out to [facility] in [place], waiting on [place] in admissions to call me back to see if pt can be placed long term with hospice to follow.    Expected Discharge Plan: [place] Barriers to Discharge:  (does not have his key to his oxygen, friend has the key)  Expected Discharge Plan and Services Expected Discharge Plan: [place]   Discharge Planning Services: CM Consult Post Acute Care Choice: Home Health, Durable Medical Equipment Living arrangements for the past 2 months: Apartment Expected Discharge Date: 03/28/20               DME Arranged: Gilford Rile rolling with seat DME Agency: [employer] Date DME Agency Contacted: 03/28/20 Time DME Agency Contacted: [phone] Representative spoke with at DME Agency: Jodell Cipro HH Arranged: RN, PT, OT [facility] Agency: [facility] Date [place]: 03/28/20 Time [place]: 1608 Representative spoke with at [place]: [place] (SDOH) Interventions Food Insecurity Interventions: Intervention Not  Indicated Financial Strain Interventions: Intervention Not Indicated Transportation Interventions: [facility]  Readmission Risk Interventions Readmission Risk Prevention Plan 03/28/2020  Transportation Screening Complete  HRI or Home Care Consult Complete  Social Work Consult for [place] Planning/Counseling Complete  Palliative Care Screening Not Applicable  Medication Review Press photographer) Complete  Some recent data might be hidden

## 2020-04-04 ENCOUNTER — Inpatient Hospital Stay (HOSPITAL_COMMUNITY): Payer: Medicare HMO

## 2020-04-04 LAB — BASIC METABOLIC PANEL
Anion gap: 7 (ref 5–15)
BUN: 30 mg/dL — ABNORMAL HIGH (ref 6–20)
CO2: 41 mmol/L — ABNORMAL HIGH (ref 22–32)
Calcium: 9.4 mg/dL (ref 8.9–10.3)
Chloride: 91 mmol/L — ABNORMAL LOW (ref 98–111)
Creatinine, Ser: 0.89 mg/dL (ref 0.61–1.24)
GFR, Estimated: >60 mL/min (ref >60)
Glucose, Bld: 193 mg/dL — ABNORMAL HIGH (ref 70–99)
Potassium: 4.8 mmol/L (ref 3.5–5.1)
Sodium: 139 mmol/L (ref 135–145)

## 2020-04-04 LAB — GLUCOSE, CAPILLARY: Glucose-Capillary: 242 mg/dL — ABNORMAL HIGH (ref 70–99)

## 2020-04-04 MED ORDER — FUROSEMIDE 10 MG/ML IJ SOLN
80.0000 mg | Freq: Two times a day (BID) | INTRAMUSCULAR | Status: DC
  Administered 2020-04-04 – 2020-04-05 (×3): 80 mg via INTRAVENOUS
  Filled 2020-04-04 (×3): qty 8

## 2020-04-04 NOTE — Plan of Care (Signed)
Brief Plan of Care note [employer] Device therapies have been deactivated.  Patient transitioning to hospice care.  No Charge  Werner Lean, MD

## 2020-04-04 NOTE — Progress Notes (Signed)
[facility] PROGRESS NOTE    Progress Note  [name]  NTI:[id number] DOB: June 24, 1960 DOA: 03/18/2020 PCP: [name], MD     Brief Narrative:   [name] is an 59 y.o. male past medical history significant for coronary artery disease status post CABG, ischemic cardiomyopathy with an EF of 20% status post AICD placement, chronic hypoxic respiratory failure secondary to COPD on 4 L of oxygen, stage Ia non-small cell cancer diagnosed in February 2015 status post right lower lobe wedge resection as well as SBRT with a recurrent disease to the right lower lobe in June 2019 which has been in observation since then. Stage T1 adenocarcinoma of the prostate with a Gleason score of 4 diagnosed in November 2019 with a confirmed PET scan and biopsy of the right lower mass consistent with adenocarcinoma currently on chemotherapy, chemotherapy-induced anemia status post transfusion presents to the emergency room with shortness of breath for 2 weeks associated with lower extremity edema, orthopnea PND and a 20 pound weight gain, BNP of 2700 bilateral pleural effusions with pulmonary edema.  He was started on IV Lasix and he diuresed about 7 L he is still requiring 4 to 5 L of oxygen, patient barely walk 50 feet a repeat a CT scan was done that showed progression of his lung cancer.  Assessment/Plan:   Acute on chronic combined systolic and diastolic CHF (congestive heart failure) ([facility]): His baseline oxygen requirement is 4 to 5 L.  He barely walk 50 feet without becoming short of breath he is not a candidate for any further chemotherapy. This more his he is requiring more oxygen, will get a chest x-ray, start him on high-dose IV Lasix,BNP is trending up he appears fluid overloaded on physical exam. Expressed to him that overall his prognosis is poor, this worsening respiratory status compromises his chances of being discharged to facility with hospice.  Hyperkalemia: Improved today.   Checking basic metabolic panels.  Coronary artery disease: Currently chest pain-free. Continue aspirin and statins.  Essential hypertension: Blood pressure seems to be fairly controlled, continue metoprolol.  Hyperlipidemia: Continue statins.  Adenocarcinoma of the right lung: Follow-up with Dr. [name] for chemotherapy as an outpatient.  COPD (chronic obstructive pulmonary disease) ([place]): Can continue inhalers currently no wheezing.  Depression/anxiety: Continue Wellbutrin.    DVT prophylaxis: lovenOX Family Communication:none Status is: Inpatient  Remains inpatient appropriate because:Hemodynamically unstable   Dispo: The patient is from: Home              Anticipated d/c is to: Home              Anticipated d/c date is: 2 days              Patient currently is not medically stable to d/c.  Skilled nursing facility with hospice.  Decompensated today requiring 3 L of high flow nasal cannula to keep saturations greater than 90%.      Code Status:     Code Status Orders  (From admission, onward)         Start     Ordered   03/18/20 1126  Full code  Continuous        03/18/20 1126        Code Status History    Date Active Date Inactive Code Status Order ID Comments User Context   12/12/2018 2034 12/14/2018 1518 Full Code [id number]  [name], MD Inpatient   10/24/2017 0140 10/26/2017 1934 Full Code [id number]  [name], MD  ED   03/23/2017 0429 03/26/2017 1710 Full Code [id number]  [name], MD ED   06/15/2015 0357 06/17/2015 1854 Full Code [id number]  [name], MD ED   08/07/2013 1900 08/16/2013 1737 Full Code [id number]  [name], PA-C Inpatient   07/18/2013 1253 07/19/2013 0343 Full Code [id number]  [name], MD [facility]   Advance Care Planning Activity        IV Access:    Peripheral IV   Procedures and diagnostic studies:   No results found.   Medical Consultants:    None.  Anti-Infectives:    none  Subjective:    [name] relates his breathing is worse today.  Objective:    Vitals:   04/03/20 2320 04/04/20 0400 04/04/20 0758 04/04/20 0800  BP: 118/79 112/73  124/81  Pulse:  93  94  Resp: 18 19  (!) 21  Temp: 97.7 F (36.5 C) (!) 97 F (36.1 C)  97.6 F (36.4 C)  TempSrc: Oral Axillary  Temporal  SpO2: 100% 96% 99% 99%  Weight:  58.5 kg    Height:       SpO2: 99 % O2 Flow Rate (L/min): 15 L/min FiO2 (%): 100 %   Intake/Output Summary (Last 24 hours) at 04/04/2020 0953 Last data filed at 04/04/2020 0400 Gross per 24 hour  Intake 450 ml  Output 1002 ml  Net -552 ml   Filed Weights   04/02/20 0453 04/03/20 0618 04/04/20 0400  Weight: 53.5 kg 57.3 kg 58.5 kg    Exam: General exam: In no acute distress. Respiratory system: Good air movement and clear to auscultation. Cardiovascular system: S1 & S2 heard, RRR. + JVD. Gastrointestinal system: Abdomen is nondistended, soft and nontender.  Extremities: No pedal edema. Skin: No rashes, lesions or ulcers Psychiatry: Judgement and insight appear normal. Mood & affect appropriate.  Data Reviewed:    Labs: Basic Metabolic Panel: Recent Labs  Lab 03/31/20 0558 03/31/20 0558 04/01/20 0615 04/02/20 1622 04/02/20 1622 04/03/20 0637 04/04/20 0545  NA 136  --   --  139  --  136 139  K 5.2*   < >  --  4.2   < > 5.5* 4.8  CL 92*  --   --  97*  --  90* 91*  CO2 36*  --   --  35*  --  38* 41*  GLUCOSE 162*  --   --  104*  --  170* 193*  BUN 21*  --   --  37*  --  45* 30*  CREATININE 0.85  --  1.14 0.86  --  1.09 0.89  CALCIUM 9.5  --   --  7.9*  --  9.7 9.4   < > = values in this interval not displayed.   GFR Estimated Creatinine Clearance: 75.8 mL/min (by C-G formula based on SCr of 0.89 mg/dL). Liver Function Tests: Recent Labs  Lab 03/31/20 0558  AST 32  ALT 38  ALKPHOS 68  BILITOT 0.5  PROT 7.4  ALBUMIN 2.7*   No results for input(s): LIPASE, AMYLASE in the last 168 hours. No  results for input(s): AMMONIA in the last 168 hours. Coagulation profile No results for input(s): INR, PROTIME in the last 168 hours. COVID-19 Labs  No results for input(s): DDIMER, FERRITIN, LDH, CRP in the last 72 hours.  Lab Results  Component Value Date   [place] NEGATIVE 03/18/2020   [place] NOT DETECTED 03/01/2019   [place] NEGATIVE 12/12/2018   SARSCOV2NAA  NOT DETECTED 10/19/2018    CBC: Recent Labs  Lab 03/31/20 0558  WBC 5.7  HGB 8.7*  HCT 28.4*  MCV 95.9  PLT 464*   Cardiac Enzymes: No results for input(s): CKTOTAL, CKMB, CKMBINDEX, TROPONINI in the last 168 hours. BNP (last 3 results) No results for input(s): PROBNP in the last 8760 hours. CBG: No results for input(s): GLUCAP in the last 168 hours. D-Dimer: No results for input(s): DDIMER in the last 72 hours. Hgb A1c: No results for input(s): HGBA1C in the last 72 hours. Lipid Profile: No results for input(s): CHOL, HDL, LDLCALC, TRIG, CHOLHDL, LDLDIRECT in the last 72 hours. Thyroid function studies: No results for input(s): TSH, T4TOTAL, T3FREE, THYROIDAB in the last 72 hours.  Invalid input(s): FREET3 Anemia work up: No results for input(s): VITAMINB12, FOLATE, FERRITIN, TIBC, IRON, RETICCTPCT in the last 72 hours. Sepsis Labs: Recent Labs  Lab 03/31/20 0558  WBC 5.7   Microbiology No results found for this or any previous visit (from the past 240 hour(s)).   Medications:   . acetaminophen  1,000 mg Oral TID  . ALPRAZolam  1 mg Oral QHS  . aspirin EC  81 mg Oral Daily  . atorvastatin  40 mg Oral Daily  . buPROPion  150 mg Oral Daily  . Chlorhexidine Gluconate Cloth  6 each Topical Daily  . dexamethasone (DECADRON) injection  8 mg Intravenous Q24H  . diclofenac Sodium  4 g Topical Daily  . enoxaparin (LOVENOX) injection  40 mg Subcutaneous Q24H  . folic acid  1 mg Oral Daily  . lidocaine  1 patch Transdermal Q24H  . metoprolol succinate  12.5 mg Oral Daily  .  mometasone-formoterol  2 puff Inhalation BID  . morphine CONCENTRATE  10 mg Oral Q4H  . senna-docusate  1 tablet Oral BID  . sodium chloride flush  10-40 mL Intracatheter Q12H  . sodium polystyrene  15 g Oral BID  . umeclidinium bromide  1 puff Inhalation Daily   Continuous Infusions:     LOS: 17 days   Charlynne Cousins  [facility]  04/04/2020, 9:53 AM

## 2020-04-05 LAB — BASIC METABOLIC PANEL
Anion gap: 7 (ref 5–15)
BUN: 19 mg/dL (ref 6–20)
CO2: 45 mmol/L — ABNORMAL HIGH (ref 22–32)
Calcium: 9.4 mg/dL (ref 8.9–10.3)
Chloride: 87 mmol/L — ABNORMAL LOW (ref 98–111)
Creatinine, Ser: 0.74 mg/dL (ref 0.61–1.24)
GFR, Estimated: >60 mL/min (ref >60)
Glucose, Bld: 110 mg/dL — ABNORMAL HIGH (ref 70–99)
Potassium: 4.4 mmol/L (ref 3.5–5.1)
Sodium: 139 mmol/L (ref 135–145)

## 2020-04-05 MED ORDER — FUROSEMIDE 10 MG/ML IJ SOLN
80.0000 mg | Freq: Three times a day (TID) | INTRAMUSCULAR | Status: DC
  Administered 2020-04-05 – 2020-04-08 (×9): 80 mg via INTRAVENOUS
  Filled 2020-04-05 (×11): qty 8

## 2020-04-05 MED ORDER — ORAL CARE MOUTH RINSE
15.0000 mL | Freq: Two times a day (BID) | OROMUCOSAL | Status: DC
  Administered 2020-04-05 – 2020-04-09 (×7): 15 mL via OROMUCOSAL

## 2020-04-05 NOTE — Progress Notes (Signed)
[facility] PROGRESS NOTE    Progress Note  Brentlee Delage  FOY:[id number] DOB: 07/08/1960 DOA: 03/18/2020 PCP: [name], MD     Brief Narrative:   Gene Glazebrook is an 59 y.o. male past medical history significant for coronary artery disease status post CABG, ischemic cardiomyopathy with an EF of 20% status post AICD placement, chronic hypoxic respiratory failure secondary to COPD on 4 L of oxygen, stage Ia non-small cell cancer diagnosed in February 2015 status post right lower lobe wedge resection as well as SBRT with a recurrent disease to the right lower lobe in June 2019 which has been in observation since then. Stage T1 adenocarcinoma of the prostate with a Gleason score of 4 diagnosed in November 2019 with a confirmed PET scan and biopsy of the right lower mass consistent with adenocarcinoma currently on chemotherapy, chemotherapy-induced anemia status post transfusion presents to the emergency room with shortness of breath for 2 weeks associated with lower extremity edema, orthopnea PND and a 20 pound weight gain, BNP of 2700 bilateral pleural effusions with pulmonary edema.  Now he is on 15 L high flow nasal cannula with JVD and a chest x-ray showing pulmonary edema his Lasix was increased.   Assessment/Plan:   Acute on chronic combined systolic and diastolic CHF (congestive heart failure) ([place]): His oxygen requirements have increased, his BNP is now 4001 bilateral infiltrates and he appeared fluid overloaded on physical exam. He is now requiring 15 L of high flow nasal cannula to keep saturations greater than 90%. He was started on IV Lasix 80 mg he had moderate diuresis. He still appears fluid overloaded on physical exam. I expressed to him his overall prognosis is poor and this his respiratory status is worsening and we should start thinking about moving towards comfort care he is currently hospice.   He related he would like to try 1 more day of IV Lasix if there is no  improvement then he would like to move towards comfort care and just focus on comfort measures.  Hyperkalemia: Improved today.   Pending metabolic panel.  Coronary artery disease: Currently chest pain-free. Continue aspirin and statins.  Essential hypertension: Blood pressure seems to be fairly controlled, continue metoprolol.  Hyperlipidemia: Continue statins.  Adenocarcinoma of the right lung: Follow-up with Dr. [name] for chemotherapy as an outpatient.  COPD (chronic obstructive pulmonary disease) ([place]): Can continue inhalers currently no wheezing.  Depression/anxiety: Continue Wellbutrin.    DVT prophylaxis: lovenOX Family Communication:none Status is: Inpatient  Remains inpatient appropriate because:Hemodynamically unstable   Dispo: The patient is from: Home              Anticipated d/c is to: Home              Anticipated d/c date is: 2 days              Patient currently is not medically stable to d/c.  Skilled nursing facility with hospice.  Decompensated today requiring 3 L of high flow nasal cannula to keep saturations greater than 90%.      Code Status:     Code Status Orders  (From admission, onward)         Start     Ordered   03/18/20 1126  Full code  Continuous        03/18/20 1126        Code Status History    Date Active Date Inactive Code Status Order ID Comments User Context   12/12/2018 2034  12/14/2018 1518 Full Code [id number]  [name], MD Inpatient   10/24/2017 0140 10/26/2017 1934 Full Code [id number]  [name], MD ED   03/23/2017 0429 03/26/2017 1710 Full Code [id number]  [name], MD ED   06/15/2015 0357 06/17/2015 1854 Full Code [id number]  [name], MD ED   08/07/2013 1900 08/16/2013 1737 Full Code [id number]  [name], PA-C Inpatient   07/18/2013 1253 07/19/2013 0343 Full Code [id number]  [name], MD [facility]   Advance Care Planning Activity        IV Access:    Peripheral  IV   Procedures and diagnostic studies:   DG CHEST PORT 1 VIEW  Result Date: 04/04/2020 CLINICAL DATA:  Dyspnea, follow-up lung opacities, history of right lung cancer EXAM: PORTABLE CHEST 1 VIEW COMPARISON:  03/29/2020 chest radiograph. FINDINGS: Right internal jugular Port-A-Cath terminates over the cavoatrial junction. Stable configuration of single lead left subclavian ICD. Intact median sternotomy wires. Stable cardiomediastinal silhouette with mild cardiomegaly. No pneumothorax. Chronic bilateral costophrenic angle blunting. Chronic dense consolidation, volume loss and distortion in upper left lung with associated bronchiectasis and chronic mycetoma as seen on prior chest CT. Patchy opacity throughout both lungs, slightly worsened in the upper right lung and improved at the right lung base. Known spiculated right retrocardiac mass. IMPRESSION: 1. Patchy opacity throughout both lungs, slightly worsened in the upper right lung and improved at the right lung base, favor multifocal pneumonia, with a component of pulmonary edema not excluded. 2. Known spiculated right retrocardiac lung mass. 3. Chronic dense consolidation, volume loss and distortion in the upper left lung with associated bronchiectasis and chronic mycetoma as seen on prior chest CT. Electronically Signed   By: Ilona Sorrel M.D.   On: 04/04/2020 15:58     Medical Consultants:    None.  Anti-Infectives:   none  Subjective:    Caylor Tallarico relates his breathing is better today than yesterday.  Objective:    Vitals:   04/05/20 0000 04/05/20 0400 04/05/20 0701 04/05/20 0751  BP: (!) 86/60 100/68  (!) 89/60  Pulse: 89 86  93  Resp: 15 20  17   Temp: 98.7 F (37.1 C)   98 F (36.7 C)  TempSrc: Oral   Oral  SpO2: 98% 94%  100%  Weight:   58.5 kg   Height:       SpO2: 100 % O2 Flow Rate (L/min): 15 L/min FiO2 (%): 100 %   Intake/Output Summary (Last 24 hours) at 04/05/2020 0918 Last data filed at 04/05/2020  0900 Gross per 24 hour  Intake 550 ml  Output 2450 ml  Net -1900 ml   Filed Weights   04/03/20 0618 04/04/20 0400 04/05/20 0701  Weight: 57.3 kg 58.5 kg 58.5 kg    Exam: General exam: In no acute distress. Respiratory system: Good air movement and diffuse crackles bilaterally. Cardiovascular system: S1 & S2 heard, RRR.  Positive JVD Gastrointestinal system: Abdomen is nondistended, soft and nontender.  Extremities: No pedal edema. Skin: No rashes, lesions or ulcers  Data Reviewed:    Labs: Basic Metabolic Panel: Recent Labs  Lab 03/31/20 0558 03/31/20 0558 04/01/20 0615 04/02/20 1622 04/02/20 1622 04/03/20 0637 04/04/20 0545  NA 136  --   --  139  --  136 139  K 5.2*   < >  --  4.2   < > 5.5* 4.8  CL 92*  --   --  97*  --  90* 91*  CO2  36*  --   --  35*  --  38* 41*  GLUCOSE 162*  --   --  104*  --  170* 193*  BUN 21*  --   --  37*  --  45* 30*  CREATININE 0.85  --  1.14 0.86  --  1.09 0.89  CALCIUM 9.5  --   --  7.9*  --  9.7 9.4   < > = values in this interval not displayed.   GFR Estimated Creatinine Clearance: 75.8 mL/min (by C-G formula based on SCr of 0.89 mg/dL). Liver Function Tests: Recent Labs  Lab 03/31/20 0558  AST 32  ALT 38  ALKPHOS 68  BILITOT 0.5  PROT 7.4  ALBUMIN 2.7*   No results for input(s): LIPASE, AMYLASE in the last 168 hours. No results for input(s): AMMONIA in the last 168 hours. Coagulation profile No results for input(s): INR, PROTIME in the last 168 hours. COVID-19 Labs  No results for input(s): DDIMER, FERRITIN, LDH, CRP in the last 72 hours.  Lab Results  Component Value Date   SARSCOV2NAA NEGATIVE 03/18/2020   SARSCOV2NAA NOT DETECTED 03/01/2019   [place] NEGATIVE 12/12/2018   SARSCOV2NAA NOT DETECTED 10/19/2018    CBC: Recent Labs  Lab 03/31/20 0558  WBC 5.7  HGB 8.7*  HCT 28.4*  MCV 95.9  PLT 464*   Cardiac Enzymes: No results for input(s): CKTOTAL, CKMB, CKMBINDEX, TROPONINI in the last 168  hours. BNP (last 3 results) No results for input(s): PROBNP in the last 8760 hours. CBG: Recent Labs  Lab 04/04/20 1138  GLUCAP 242*   D-Dimer: No results for input(s): DDIMER in the last 72 hours. Hgb A1c: No results for input(s): HGBA1C in the last 72 hours. Lipid Profile: No results for input(s): CHOL, HDL, LDLCALC, TRIG, CHOLHDL, LDLDIRECT in the last 72 hours. Thyroid function studies: No results for input(s): TSH, T4TOTAL, T3FREE, THYROIDAB in the last 72 hours.  Invalid input(s): FREET3 Anemia work up: No results for input(s): VITAMINB12, FOLATE, FERRITIN, TIBC, IRON, RETICCTPCT in the last 72 hours. Sepsis Labs: Recent Labs  Lab 03/31/20 0558  WBC 5.7   Microbiology No results found for this or any previous visit (from the past 240 hour(s)).   Medications:   . acetaminophen  1,000 mg Oral TID  . ALPRAZolam  1 mg Oral QHS  . buPROPion  150 mg Oral Daily  . Chlorhexidine Gluconate Cloth  6 each Topical Daily  . diclofenac Sodium  4 g Topical Daily  . enoxaparin (LOVENOX) injection  40 mg Subcutaneous Q24H  . furosemide  80 mg Intravenous Q12H  . lidocaine  1 patch Transdermal Q24H  . mouth rinse  15 mL Mouth Rinse BID  . metoprolol succinate  12.5 mg Oral Daily  . mometasone-formoterol  2 puff Inhalation BID  . morphine CONCENTRATE  10 mg Oral Q4H  . senna-docusate  1 tablet Oral BID  . sodium chloride flush  10-40 mL Intracatheter Q12H  . sodium polystyrene  15 g Oral BID  . umeclidinium bromide  1 puff Inhalation Daily   Continuous Infusions:     LOS: 18 days   Charlynne Cousins  [facility]  04/05/2020, 9:18 AM

## 2020-04-05 NOTE — Plan of Care (Signed)
Able to wean high flow oxygen to 10L, will continue to monitor.

## 2020-04-06 ENCOUNTER — Inpatient Hospital Stay (HOSPITAL_COMMUNITY): Payer: Medicare HMO

## 2020-04-06 LAB — BASIC METABOLIC PANEL
Anion gap: 7 (ref 5–15)
BUN: 13 mg/dL (ref 6–20)
CO2: 46 mmol/L — ABNORMAL HIGH (ref 22–32)
Calcium: 9.1 mg/dL (ref 8.9–10.3)
Chloride: 87 mmol/L — ABNORMAL LOW (ref 98–111)
Creatinine, Ser: 0.69 mg/dL (ref 0.61–1.24)
GFR, Estimated: >60 mL/min (ref >60)
Glucose, Bld: 99 mg/dL (ref 70–99)
Potassium: 3.3 mmol/L — ABNORMAL LOW (ref 3.5–5.1)
Sodium: 140 mmol/L (ref 135–145)

## 2020-04-06 MED ORDER — POTASSIUM CHLORIDE CRYS ER 20 MEQ PO TBCR
40.0000 meq | EXTENDED_RELEASE_TABLET | Freq: Two times a day (BID) | ORAL | Status: AC
  Administered 2020-04-06 (×2): 40 meq via ORAL
  Filled 2020-04-06 (×2): qty 2

## 2020-04-06 NOTE — Progress Notes (Signed)
HOSPITAL MEDICINE OVERNIGHT EVENT NOTE    Notified by nursing that the patient's MEWS score is yellow.   Per my discussion with nursing, patient denies any new complaints.  Patient is afebrile.    Of note, patient's oxygen requirements are slowly decreasing.  Patient is currently on 8 L of oxygen via the [place] high flow nasal cannula.  Patient does exhibit somewhat low blood pressures in the 90 systolic range.  However, patient has a markedly low EF of less than 20% per his last echocardiogram and therefore blood pressures this low are not uncommon.  I have advised to proceed with continuing to give the intravenous Lasix, systolic blood pressures are less than 90.  Garrett Emerald  MD [facility]

## 2020-04-06 NOTE — Progress Notes (Signed)
[facility] PROGRESS NOTE    Progress Note  Britt Petroni  [name] DOB: 03-24-1961 DOA: 03/18/2020 PCP: [name], MD     Brief Narrative:   Brylin Stanislawski is an 59 y.o. male past medical history significant for coronary artery disease status post CABG, ischemic cardiomyopathy with an EF of 20% status post AICD placement, chronic hypoxic respiratory failure secondary to COPD on 4 L of oxygen, stage Ia non-small cell cancer diagnosed in February 2015 status post right lower lobe wedge resection as well as SBRT with a recurrent disease to the right lower lobe in June 2019 which has been in observation since then. Stage T1 adenocarcinoma of the prostate with a Gleason score of 4 diagnosed in November 2019 with a confirmed PET scan and biopsy of the right lower mass consistent with adenocarcinoma currently on chemotherapy, chemotherapy-induced anemia status post transfusion presents to the emergency room with shortness of breath for 2 weeks associated with lower extremity edema, orthopnea PND and a 20 pound weight gain, BNP of 2700 bilateral pleural effusions with pulmonary edema.  Now he is on 15 L high flow nasal cannula with JVD and a chest x-ray showing pulmonary edema his Lasix was increased.   Assessment/Plan:   Acute on chronic combined systolic and diastolic CHF (congestive heart failure) ([place]): Despite diuresis he still requiring 15 L of oxygen to keep saturations greater than 90% he still appears fluid overloaded on physical exam We will increase his Lasix to IV every 8.  Restrict his fluid intake.  I's and O's are poorly recorded. Awaiting skilled nursing facility placement with hospice care.  Hyperkalemia/Hypokalemia: Low today replete potassium orally.  Coronary artery disease: Currently chest pain-free. Continue aspirin and statins.  Essential hypertension: Blood pressure seems to be fairly controlled, continue metoprolol.  Hyperlipidemia: Continue  statins.  Adenocarcinoma of the right lung: Follow-up with Dr. [name] for chemotherapy as an outpatient.  COPD (chronic obstructive pulmonary disease) ([place]): Can continue inhalers currently no wheezing.  Depression/anxiety: Continue Wellbutrin.    DVT prophylaxis: lovenox Family Communication:none Status is: Inpatient  Remains inpatient appropriate because:Hemodynamically unstable   Dispo: The patient is from: Home              Anticipated d/c is to: Home              Anticipated d/c date is: 2 days              Patient currently is not medically stable to d/c.  Skilled nursing facility with hospice.  Decompensated today requiring 3 L of high flow nasal cannula to keep saturations greater than 90%.      Code Status:     Code Status Orders  (From admission, onward)         Start     Ordered   03/18/20 1126  Full code  Continuous        03/18/20 1126        Code Status History    Date Active Date Inactive Code Status Order ID Comments User Context   12/12/2018 2034 12/14/2018 1518 Full Code [id number]  [name], MD Inpatient   10/24/2017 0140 10/26/2017 1934 Full Code [id number]  [name], MD ED   03/23/2017 0429 03/26/2017 1710 Full Code [id number]  [name], MD ED   06/15/2015 0357 06/17/2015 1854 Full Code [id number]  [name], MD ED   08/07/2013 1900 08/16/2013 1737 Full Code [id number]  [name], PA-C Inpatient  07/18/2013 1253 07/19/2013 0343 Full Code [id number]  [name], MD [facility]   Advance Care Planning Activity        IV Access:    Peripheral IV   Procedures and diagnostic studies:   DG CHEST PORT 1 VIEW  Result Date: 04/04/2020 CLINICAL DATA:  Dyspnea, follow-up lung opacities, history of right lung cancer EXAM: PORTABLE CHEST 1 VIEW COMPARISON:  03/29/2020 chest radiograph. FINDINGS: Right internal jugular Port-A-Cath terminates over the cavoatrial junction. Stable configuration of single lead left  subclavian ICD. Intact median sternotomy wires. Stable cardiomediastinal silhouette with mild cardiomegaly. No pneumothorax. Chronic bilateral costophrenic angle blunting. Chronic dense consolidation, volume loss and distortion in upper left lung with associated bronchiectasis and chronic mycetoma as seen on prior chest CT. Patchy opacity throughout both lungs, slightly worsened in the upper right lung and improved at the right lung base. Known spiculated right retrocardiac mass. IMPRESSION: 1. Patchy opacity throughout both lungs, slightly worsened in the upper right lung and improved at the right lung base, favor multifocal pneumonia, with a component of pulmonary edema not excluded. 2. Known spiculated right retrocardiac lung mass. 3. Chronic dense consolidation, volume loss and distortion in the upper left lung with associated bronchiectasis and chronic mycetoma as seen on prior chest CT. Electronically Signed   By: Ilona Sorrel M.D.   On: 04/04/2020 15:58     Medical Consultants:    None.  Anti-Infectives:   none  Subjective:    Lawrance Wiedemann relates his breathing is about the same.  Objective:    Vitals:   04/06/20 0100 04/06/20 0350 04/06/20 0400 04/06/20 0733  BP:   98/64 107/73  Pulse: 94  87   Resp: 20  17   Temp:   97.9 F (36.6 C) 98.4 F (36.9 C)  TempSrc:   Oral Oral  SpO2: 100%  97%   Weight:  58 kg    Height:       SpO2: 97 % O2 Flow Rate (L/min): 15 L/min FiO2 (%): 95 %   Intake/Output Summary (Last 24 hours) at 04/06/2020 0959 Last data filed at 04/06/2020 0925 Gross per 24 hour  Intake 1310 ml  Output 2400 ml  Net -1090 ml   Filed Weights   04/04/20 0400 04/05/20 0701 04/06/20 0350  Weight: 58.5 kg 58.5 kg 58 kg    Exam: General exam: In no acute distress. Respiratory system: Good air movement and clear to auscultation. Cardiovascular system: S1 & S2 heard, RRR. No JVD. Gastrointestinal system: Abdomen is nondistended, soft and nontender.   Extremities: No pedal edema. Skin: No rashes, lesions or ulcers  Data Reviewed:    Labs: Basic Metabolic Panel: Recent Labs  Lab 04/02/20 1622 04/02/20 1622 04/03/20 0637 04/03/20 0637 04/04/20 0545 04/04/20 0545 04/05/20 1641 04/06/20 0350  NA 139  --  136  --  139  --  139 140  K 4.2   < > 5.5*   < > 4.8   < > 4.4 3.3*  CL 97*  --  90*  --  91*  --  87* 87*  CO2 35*  --  38*  --  41*  --  45* 46*  GLUCOSE 104*  --  170*  --  193*  --  110* 99  BUN 37*  --  45*  --  30*  --  19 13  CREATININE 0.86  --  1.09  --  0.89  --  0.74 0.69  CALCIUM 7.9*  --  9.7  --  9.4  --  9.4 9.1   < > = values in this interval not displayed.   GFR Estimated Creatinine Clearance: 83.6 mL/min (by C-G formula based on SCr of 0.69 mg/dL). Liver Function Tests: Recent Labs  Lab 03/31/20 0558  AST 32  ALT 38  ALKPHOS 68  BILITOT 0.5  PROT 7.4  ALBUMIN 2.7*   No results for input(s): LIPASE, AMYLASE in the last 168 hours. No results for input(s): AMMONIA in the last 168 hours. Coagulation profile No results for input(s): INR, PROTIME in the last 168 hours. COVID-19 Labs  No results for input(s): DDIMER, FERRITIN, LDH, CRP in the last 72 hours.  Lab Results  Component Value Date   SARSCOV2NAA NEGATIVE 03/18/2020   SARSCOV2NAA NOT DETECTED 03/01/2019   [place] NEGATIVE 12/12/2018   SARSCOV2NAA NOT DETECTED 10/19/2018    CBC: Recent Labs  Lab 03/31/20 0558  WBC 5.7  HGB 8.7*  HCT 28.4*  MCV 95.9  PLT 464*   Cardiac Enzymes: No results for input(s): CKTOTAL, CKMB, CKMBINDEX, TROPONINI in the last 168 hours. BNP (last 3 results) No results for input(s): PROBNP in the last 8760 hours. CBG: Recent Labs  Lab 04/04/20 1138  GLUCAP 242*   D-Dimer: No results for input(s): DDIMER in the last 72 hours. Hgb A1c: No results for input(s): HGBA1C in the last 72 hours. Lipid Profile: No results for input(s): CHOL, HDL, LDLCALC, TRIG, CHOLHDL, LDLDIRECT in the last 72  hours. Thyroid function studies: No results for input(s): TSH, T4TOTAL, T3FREE, THYROIDAB in the last 72 hours.  Invalid input(s): FREET3 Anemia work up: No results for input(s): VITAMINB12, FOLATE, FERRITIN, TIBC, IRON, RETICCTPCT in the last 72 hours. Sepsis Labs: Recent Labs  Lab 03/31/20 0558  WBC 5.7   Microbiology No results found for this or any previous visit (from the past 240 hour(s)).   Medications:   . acetaminophen  1,000 mg Oral TID  . ALPRAZolam  1 mg Oral QHS  . buPROPion  150 mg Oral Daily  . Chlorhexidine Gluconate Cloth  6 each Topical Daily  . diclofenac Sodium  4 g Topical Daily  . enoxaparin (LOVENOX) injection  40 mg Subcutaneous Q24H  . furosemide  80 mg Intravenous Q8H  . lidocaine  1 patch Transdermal Q24H  . mouth rinse  15 mL Mouth Rinse BID  . metoprolol succinate  12.5 mg Oral Daily  . mometasone-formoterol  2 puff Inhalation BID  . morphine CONCENTRATE  10 mg Oral Q4H  . potassium chloride  40 mEq Oral BID  . senna-docusate  1 tablet Oral BID  . sodium chloride flush  10-40 mL Intracatheter Q12H  . sodium polystyrene  15 g Oral BID  . umeclidinium bromide  1 puff Inhalation Daily   Continuous Infusions:     LOS: 19 days   Charlynne Cousins  [facility]  04/06/2020, 9:59 AM

## 2020-04-06 NOTE — Progress Notes (Signed)
Attempted wean o2 down patient began to get spacey with decreased mentation with o2 sats dropping down into the low 70's so had to increase o2 back up I noticed his mentation improved and he was not spacey then. He sat oob in geri chair most of the day. He had large bm from kayexyalate given today.

## 2020-04-06 NOTE — Progress Notes (Signed)
PT Cancellation Note  Patient Details Name: [name] MRN: [id number] DOB: November 27, 1960   Cancelled Treatment:    Reason Eval/Treat Not Completed: Other (comment) Declining therapy session due to fatigue.  Wyona Almas, PT, DPT Acute Rehabilitation Services Pager [phone] Office [phone]    Deno Etienne 04/06/2020, 3:57 PM

## 2020-04-06 NOTE — Plan of Care (Signed)
  Problem: Activity: Goal: Risk for activity intolerance will decrease Outcome: Progressing   Problem: Pain Managment: Goal: General experience of comfort will improve Outcome: Progressing   Problem: Safety: Goal: Ability to remain free from injury will improve Outcome: Progressing   

## 2020-04-07 ENCOUNTER — Inpatient Hospital Stay: Payer: Medicare HMO | Attending: Physician Assistant

## 2020-04-07 LAB — BASIC METABOLIC PANEL
Anion gap: 15 (ref 5–15)
BUN: 12 mg/dL (ref 6–20)
CO2: 42 mmol/L — ABNORMAL HIGH (ref 22–32)
Calcium: 9.2 mg/dL (ref 8.9–10.3)
Chloride: 84 mmol/L — ABNORMAL LOW (ref 98–111)
Creatinine, Ser: 0.72 mg/dL (ref 0.61–1.24)
GFR, Estimated: >60 mL/min (ref >60)
Glucose, Bld: 106 mg/dL — ABNORMAL HIGH (ref 70–99)
Potassium: 3.8 mmol/L (ref 3.5–5.1)
Sodium: 141 mmol/L (ref 135–145)

## 2020-04-07 MED ORDER — ALPRAZOLAM 1 MG PO TABS
1.0000 mg | ORAL_TABLET | Freq: Every day | ORAL | 0 refills | Status: DC
Start: 2020-04-07 — End: 2020-04-20

## 2020-04-07 MED ORDER — ALPRAZOLAM 1 MG PO TABS: 1.0000 mg | ORAL_TABLET | Freq: Three times a day (TID) | ORAL | 0 refills | Status: DC | PRN

## 2020-04-07 MED ORDER — METOPROLOL SUCCINATE ER 25 MG PO TB24
25.0000 mg | ORAL_TABLET | Freq: Two times a day (BID) | ORAL | 0 refills | Status: DC
Start: 2020-04-07 — End: 2020-04-20

## 2020-04-07 MED ORDER — FUROSEMIDE 40 MG PO TABS
80.0000 mg | ORAL_TABLET | Freq: Every day | ORAL | 0 refills | Status: DC
Start: 2020-04-07 — End: 2020-04-20

## 2020-04-07 MED ORDER — FUROSEMIDE 40 MG PO TABS: 40.0000 mg | ORAL_TABLET | Freq: Two times a day (BID) | ORAL | 11 refills | Status: DC

## 2020-04-07 NOTE — Progress Notes (Signed)
OT Cancellation Note  Patient Details Name: [name] MRN: [id number] DOB: Aug 18, 1960   Cancelled Treatment:    Reason Eval/Treat Not Completed: Other (comment). Per chart review and conversation with LCSW, pt is now for hospice. Will sign off in keeping with pt's current plan/goals of care.   Tyrone Schimke, OT Acute Rehabilitation Services Pager: [phone] Office: [phone]  04/07/2020, 12:26 PM

## 2020-04-07 NOTE — Plan of Care (Signed)
°  Problem: Education: °Goal: Ability to demonstrate management of disease process will improve °Outcome: Progressing °Goal: Ability to verbalize understanding of medication therapies will improve °Outcome: Progressing °Goal: Individualized Educational Video(s) °Outcome: Progressing °  °

## 2020-04-07 NOTE — Discharge Summary (Signed)
Physician Discharge Summary  [name] JQB:[id number] DOB: 22-Mar-1961 DOA: 03/18/2020  PCP: [name], MD  Admit date: 03/18/2020 Discharge date: 04/07/2020  Admitted From: Home  Disposition:  SNF  Recommendations for Outpatient Follow-up:  He will go to rehab with hospice care. Home Health:No Equipment/Devices: Oxygen 4 to 6 L.  Discharge Condition:Hospice CODE STATUS:DNR Diet recommendation: Heart Healthy  Brief/Interim Summary:  59 y.o. male past medical history significant for coronary artery disease status post CABG, ischemic cardiomyopathy with an EF of 20% status post AICD placement, chronic hypoxic respiratory failure secondary to COPD on 4 L of oxygen, stage Ia non-small cell cancer diagnosed in February 2015 status post right lower lobe wedge resection as well as SBRT with a recurrent disease to the right lower lobe in June 2019 which has been in observation since then. Stage T1 adenocarcinoma of the prostate with a Gleason score of 4 diagnosed in November 2019 with a confirmed PET scan and biopsy of the right lower mass consistent with adenocarcinoma currently on chemotherapy, chemotherapy-induced anemia status post transfusion presents to the emergency room with shortness of breath for 2 weeks associated with lower extremity edema, orthopnea PND and a 20 pound weight gain, BNP of 2700 bilateral pleural effusions with pulmonary edema.  Now he is on 15 L high flow nasal cannula with JVD and a chest x-ray showing pulmonary edema his Lasix was increased.   Discharge Diagnoses:  Principal Problem:   Acute on chronic combined systolic and diastolic CHF (congestive heart failure) (HCC) Active Problems:   Coronary artery disease   Hyperlipidemia   Cardiomyopathy, ischemic   Right lower lobe lung mass   COPD (chronic obstructive pulmonary disease) (HCC)   Acute on chronic respiratory failure with hypoxia (HCC)   Drug-induced hypotension   ICD (implantable  cardioverter-defibrillator) in place   Acute on chronic combined systolic (congestive) and diastolic (congestive) heart failure ([place])   Palliative care encounter   Chronic right-sided low back pain without sciatica   DNR (do not resuscitate)   Protein-calorie malnutrition ([place])  Acute combined systolic and diastolic heart failure: His oxygen requirement at baseline is 4 to 5 L, He was diuresed aggressively CT scan showed diffuse groundglass opacity with progressive adenocarcinoma superimposed on emphysematous changes. He was diuresed and he will go to nursing facility with hospice on 80 mg twice a day. Palliative Care was consulted and the patient was made hospice, he will go to skilled with hospice to follow-up at the facility. Dose of medication was discontinued except for his metoprolol and his Lasix which she will continue as an outpatient.  Hyperkalemia: Resolved with Kayexalate well-controlled on Lasix.  Coronary artery disease: Chest pain-free aspirin and statin were discontinued as he is hospice.  Essential hypertension: Continue metoprolol and Lasix.  Hyperlipidemia: Stents were discontinued.  Adenocarcinoma of the right lung:  CT scan of the chest was done that showed progressive disease he is not a candidate for chemotherapy due to his severe deconditioning, palliative care met with him he related he wanted no heroic measures when he was told to visit his prognosis he decided to move towards hospice at a facility.  Discharge Instructions  Discharge Instructions    (HEART FAILURE PATIENTS) Call MD:  Anytime you have any of the following symptoms: 1) 3 pound weight gain in 24 hours or 5 pounds in 1 week 2) shortness of breath, with or without a dry hacking cough 3) swelling in the hands, feet or stomach 4) if you have to  sleep on extra pillows at night in order to breathe.   Complete by: As directed    Diet - low sodium heart healthy   Complete by: As directed    Increase  activity slowly   Complete by: As directed    Increase activity slowly   Complete by: As directed      Allergies as of 04/07/2020   No Known Allergies     Medication List    STOP taking these medications   aspirin EC 81 MG tablet   atorvastatin 40 MG tablet Commonly known as: LIPITOR   buPROPion 150 MG 12 hr tablet Commonly known as: WELLBUTRIN SR   carvedilol 3.125 MG tablet Commonly known as: COREG   Entresto 24-26 MG Generic drug: sacubitril-valsartan   predniSONE 10 MG tablet Commonly known as: DELTASONE     TAKE these medications   acetaminophen 325 MG tablet Commonly known as: TYLENOL Take 2 tablets (650 mg total) by mouth every 4 (four) hours as needed for headache or mild pain.   ALPRAZolam 1 MG tablet Commonly known as: XANAX Take 1 tablet (1 mg total) by mouth at bedtime.   ALPRAZolam 1 MG tablet Commonly known as: XANAX Take 1 tablet (1 mg total) by mouth 3 (three) times daily as needed for anxiety.   diclofenac Sodium 1 % Gel Commonly known as: VOLTAREN Apply 4 g topically daily.   Fluticasone-Salmeterol 250-50 MCG/DOSE Aepb Commonly known as: Advair Diskus Inhale 1 puff into the lungs 2 (two) times daily.   folic acid 1 MG tablet Commonly known as: FOLVITE Take 1 tablet (1 mg total) by mouth daily.   furosemide 40 MG tablet Commonly known as: Lasix Take 1 tablet (40 mg total) by mouth 2 (two) times daily.   furosemide 40 MG tablet Commonly known as: LASIX Take 2 tablets (80 mg total) by mouth daily.   ipratropium 0.02 % nebulizer solution Commonly known as: ATROVENT Take 3 mLs by nebulization every 6 (six) hours as needed for wheezing or shortness of breath.   lidocaine 5 % Commonly known as: LIDODERM Place 1 patch onto the skin daily. Remove & Discard patch within 12 hours or as directed by MD   lidocaine-prilocaine cream Commonly known as: EMLA Apply 1 application topically as needed.   metoprolol succinate 25 MG 24 hr  tablet Commonly known as: TOPROL-XL Take 1 tablet (25 mg total) by mouth in the morning and at bedtime.   potassium chloride SA 20 MEQ tablet Commonly known as: KLOR-CON Take 1 tablet (20 mEq total) by mouth daily. What changed: Another medication with the same name was removed. Continue taking this medication, and follow the directions you see here.   prochlorperazine 10 MG tablet Commonly known as: COMPAZINE TAKE 1 TABLET BY MOUTH EVERY 6 HOURS AS NEEDED FOR NAUSEA FOR VOMITING What changed: See the new instructions.   Spiriva HandiHaler 18 MCG inhalation capsule Generic drug: tiotropium INHALE THE CONTENTS OF 1 CAPSULE EVERY DAY What changed:   how much to take  how to take this  when to take this  additional instructions            Durable Medical Equipment  (From admission, onward)         Start     Ordered   03/28/20 1555  For home use only DME 4 wheeled rolling walker with seat  Once       Comments: With oxygen tank holder  Question:  Patient needs a walker to  treat with the following condition  Answer:  Weakness   03/28/20 1559          Follow-up Information    [facility] Follow up.   Why: HHRN,HHPT, HHOT Contact information: [address] [place] [address] [place] Follow up.   Why: rollator Contact information: [place] [address] [phone]              No Known Allergies  Consultations:  Cards  PMT   Procedures/Studies: DG Chest 1 View  Result Date: 04/06/2020 CLINICAL DATA:  59 year old male with shortness of breath. EXAM: CHEST  1 VIEW COMPARISON:  Chest radiograph dated 04/04/2020. FINDINGS: Right-sided Port-A-Cath with tip at the cavoatrial junction. Diffuse chronic interstitial coarsening as seen on the prior radiograph. No new consolidation. No large pleural effusion or pneumothorax. Thickening of the left upper lobe pleural surface. Left upper  lobe cavitary lesion with peripheral air. There is stable cardiomegaly. Left pectoral AICD device. Median sternotomy wires and coronary stent. No acute osseous pathology. IMPRESSION: 1. No new consolidation. Overall no significant interval change in the appearance of the lungs compared to the prior radiograph. 2. Left upper lobe cavitary lesion with peripheral air. 3. Stable cardiomegaly. Electronically Signed   By: Anner Crete M.D.   On: 04/06/2020 22:45   DG Chest 1 View  Result Date: 03/29/2020 CLINICAL DATA:  Short of breath.  History of lung carcinoma. EXAM: CHEST  1 VIEW COMPARISON:  03/18/2020 and earlier exams. FINDINGS: Stable changes from previous cardiac surgery. Left coronary artery stents. Masslike opacity superimposed over the right hilum is stable. No convincing mediastinal masses. Left hemithorax volume loss with mediastinal shift to the left. There is consolidation in the left lung most apparent in the upper lung, as well as linear/reticular type opacities, stable from the prior study. On the right, patchy airspace opacity is noted in the mid and lower lung superimposed on chronic interstitial thickening. The lung is hyperexpanded. No pneumothorax. Stable left anterior chest wall AICD and right anterior chest wall Port-A-Cath. IMPRESSION: 1. New patchy airspace opacity in the right mid to lower lung superimposed on chronic interstitial thickening. New lung opacities are suspicious for superimposed pneumonia. 2. No other change from the prior exam. Extensive chronic changes as detailed. Electronically Signed   By: Lajean Manes M.D.   On: 03/29/2020 08:39   CT ANGIO CHEST PE W OR WO CONTRAST  Result Date: 03/30/2020 CLINICAL DATA:  Pulmonary fibrosis, lung cancer, dyspnea EXAM: CT ANGIOGRAPHY CHEST WITH CONTRAST TECHNIQUE: Multidetector CT imaging of the chest was performed using the standard protocol during bolus administration of intravenous contrast. Multiplanar CT image  reconstructions and MIPs were obtained to evaluate the vascular anatomy. CONTRAST:  152m OMNIPAQUE IOHEXOL 350 MG/ML SOLN COMPARISON:  01/06/2020 FINDINGS: Cardiovascular: There is excellent opacification of the pulmonary arterial tree. There is no intraluminal filling defect identified to suggest acute pulmonary embolism. The central pulmonary arteries are enlarged in keeping with changes of pulmonary arterial hypertension. Coronary artery bypass grafting has been performed with stenting within a left anterior descending saphenous vein graft. There is mild global cardiomegaly with moderate left ventricular dilation and superimposed mild left ventricular hypertrophy. No pericardial effusion. Left subclavian single lead pacemaker is seen with its lead within the right ventricle. Right internal jugular chest port tip is seen within the right atrium. The thoracic aorta demonstrates mild atherosclerotic calcification, but is otherwise unremarkable. Mediastinum/Nodes: Thyroid  unremarkable. Pathologic pre-vascular mediastinal adenopathy is again identified exact measurement is difficult due to streak artifact from overlying pacemaker, however, the index lymph node demonstrates progressive enlargement, measuring 2.0 x 2.8 cm at axial image # 47/5. Right hilar adenopathy has progressed with the index lymph node within the right hilum measuring 1.7 x 2.1 cm at axial image # 63/5. thyroid unremarkable. Esophagus unremarkable. Lungs/Pleura: Severe, end-stage cystic lung disease within the left apex with associated left-sided volume loss is again identified with probable 5 cm aspergillosis a again identified at the left apex. Moderate severe centrilobular paraseptal emphysema. There is interval development of diffuse ground-glass pulmonary infiltrate throughout the residual pulmonary parenchyma and new consolidation at the right lung base subpleurally, likely infectious in the appropriate clinical setting. Alternatively,  diffuse infiltrate could relate to pulmonary edema with consolidation at the right lung base representing superimposed infection should or changes of radiation pneumonitis in the appropriate clinical setting. Pleural base mass at the right lung base demonstrates interval increase in size measuring 3.7 x 4.4 cm at axial image # 99/11 Upper Abdomen: Extensive reflux of contrast into the hepatic venous system secondary to at least some degree of right heart failure. No acute abnormality. Musculoskeletal: No acute bone abnormality. Review of the MIP images confirms the above findings. IMPRESSION: Interval development of diffuse ground-glass pulmonary infiltrate and right basilar subpleural consolidation. This may relate to diffuse infection or, more likely, diffuse pulmonary edema with superimposed infectious or post radiation change at the right lung base. 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 Superimposed moderate emphysema. Interval disease progression with enlargement of the neoplastic mass at the right lung base and developing right hilar adenopathy. Progressive pre-vascular adenopathy of unclear etiology. Moderate left ventricular dilation. Reflux of contrast into the hepatic venous vasculature in keeping with at least some degree of right heart failure. No pulmonary embolism. Aortic Atherosclerosis (ICD10-I70.0) and Emphysema (ICD10-J43.9). Electronically Signed   By: Fidela Salisbury MD   On: 03/30/2020 22:33   DG CHEST PORT 1 VIEW  Result Date: 04/04/2020 CLINICAL DATA:  Dyspnea, follow-up lung opacities, history of right lung cancer EXAM: PORTABLE CHEST 1 VIEW COMPARISON:  03/29/2020 chest radiograph. FINDINGS: Right internal jugular Port-A-Cath terminates over the cavoatrial junction. Stable configuration of single lead left subclavian ICD. Intact median sternotomy wires. Stable cardiomediastinal silhouette with mild cardiomegaly. No pneumothorax. Chronic bilateral  costophrenic angle blunting. Chronic dense consolidation, volume loss and distortion in upper left lung with associated bronchiectasis and chronic mycetoma as seen on prior chest CT. Patchy opacity throughout both lungs, slightly worsened in the upper right lung and improved at the right lung base. Known spiculated right retrocardiac mass. IMPRESSION: 1. Patchy opacity throughout both lungs, slightly worsened in the upper right lung and improved at the right lung base, favor multifocal pneumonia, with a component of pulmonary edema not excluded. 2. Known spiculated right retrocardiac lung mass. 3. Chronic dense consolidation, volume loss and distortion in the upper left lung with associated bronchiectasis and chronic mycetoma as seen on prior chest CT. Electronically Signed   By: Ilona Sorrel M.D.   On: 04/04/2020 15:58   DG Chest Portable 1 View  Result Date: 03/18/2020 CLINICAL DATA:  Shortness of breath, lung cancer.  Swelling in legs. EXAM: PORTABLE CHEST 1 VIEW COMPARISON:  Chest radiograph 03/05/2019.  Chest CT 01/06/2020 FINDINGS: Similar opacification left lung apex with interspersed lucencies, characterized as bronchiectasis, chronic lung disease, and probable large aspirate alone a on prior CT. Right lower  lobe mass was better characterized on recent CT. There is new mild diffuse interstitial prominence, suggestive of mild interstitial edema. Pulmonary vascular congestion. Suspected small bilateral pleural effusions. No discernible pneumothorax. Right Port-A-Cath with the tip projecting at the cavoatrial junction. Left subclavian approach cardiac rhythm maintenance device. IMPRESSION: 1. New mild diffuse interstitial prominence, pulmonary vascular congestion, and suspected small bilateral pleural effusions, possibly related to mild pulmonary edema. Atypical pneumonia is a consideration. 2. Right lower lobe mass was better characterized on recent CT. 3. Similar chronic changes in the left lung apex, as  detailed above. Electronically Signed   By: Margaretha Sheffield MD   On: 03/18/2020 09:38   ECHOCARDIOGRAM COMPLETE  Result Date: 03/18/2020    ECHOCARDIOGRAM REPORT   Patient Name:   [facility] Date of Exam: 03/18/2020 Medical Rec #:  [id number]     Height:       68.0 in Accession #:    [id number]    Weight:       140.5 lb Date of Birth:  [birth year]     BSA:          1.759 m Patient Age:    27 years      BP:           132/76 mmHg Patient Gender: M             HR:           115 bpm. Exam Location:  Inpatient Procedure: 2D Echo Indications:    CHF 428  History:        Patient has prior history of Echocardiogram examinations, most                 recent 03/01/2018. CHF and Cardiomyopathy, CAD and Previous                 Myocardial Infarction, Defibrillator and Prior CABG, COPD; Risk                 Factors:Dyslipidemia and Former Smoker. AICD. STEMI.  Sonographer:    Jannett Celestine RDCS (AE) Referring Phys: [id number] Michell Heinrich PAHWANI IMPRESSIONS  1. Left ventricular ejection fraction, by estimation, is <20%. The left ventricle has severely decreased function. The left ventricle demonstrates regional wall motion abnormalities (see scoring diagram/findings for description). The left ventricular internal cavity size was moderately to severely dilated. There is mild concentric left ventricular hypertrophy. Left ventricular diastolic function could not be evaluated. There is diffuse severe hypokinesis with akinesis of the distal to apical segments  of all walls.  2. Right ventricular systolic function was not well visualized. The right ventricular size is not well visualized.  3. The mitral valve is normal in structure. Mild to moderate mitral valve regurgitation.  4. The aortic valve is grossly normal. Aortic valve regurgitation is mild. No aortic stenosis is present. Comparison(s): Prior images unable to be directly viewed, comparison made by report only. Conclusion(s)/Recommendation(s): Severely reduced LVEF with both  global and focal wall motion abnormalities. FINDINGS  Left Ventricle: LV apical thrombus excluded by contrast. Left ventricular ejection fraction, by estimation, is <20%. The left ventricle has severely decreased function. The left ventricle demonstrates regional wall motion abnormalities. The left ventricular internal cavity size was moderately to severely dilated. There is mild concentric left ventricular hypertrophy. Left ventricular diastolic function could not be evaluated.  LV Wall Scoring: There is diffuse severe hypokinesis with akinesis of the distal to apical segments of all walls. Right Ventricle: The right ventricular size is  not well visualized. Right vetricular wall thickness was not assessed. Right ventricular systolic function was not well visualized. Left Atrium: Left atrial size was not assessed. Right Atrium: Right atrial size was not assessed. Pericardium: There is no evidence of pericardial effusion. Mitral Valve: The mitral valve is normal in structure. Mild to moderate mitral valve regurgitation. Tricuspid Valve: The tricuspid valve is normal in structure. Tricuspid valve regurgitation is trivial. No evidence of tricuspid stenosis. Aortic Valve: The aortic valve is grossly normal. Aortic valve regurgitation is mild. No aortic stenosis is present. Pulmonic Valve: The pulmonic valve was grossly normal. Pulmonic valve regurgitation is not visualized. Aorta: The aortic root is normal in size and structure. Venous: IVC assessment for right atrial pressure unable to be performed due to mechanical ventilation. IAS/Shunts: The interatrial septum was not assessed. Additional Comments: A pacer wire is visualized in the right atrium and right ventricle.  LEFT VENTRICLE PLAX 2D LVIDd:         6.20 cm LVIDs:         5.20 cm LV PW:         1.20 cm LV IVS:        1.10 cm LVOT diam:     2.10 cm LV SV:         30 LV SV Index:   17 LVOT Area:     3.46 cm  LEFT ATRIUM         Index LA diam:    3.80 cm 2.16  cm/m  AORTIC VALVE LVOT Vmax:   59.70 cm/s LVOT Vmean:  44.200 cm/s LVOT VTI:    0.087 m  AORTA Ao Root diam: 3.30 cm MITRAL VALVE MV Area (PHT): 4.68 cm    SHUNTS MV Decel Time: 162 msec    Systemic VTI:  0.09 m MR Peak grad: 67.2 mmHg    Systemic Diam: 2.10 cm MR Mean grad: 41.0 mmHg MR Vmax:      410.00 cm/s MR Vmean:     300.0 cm/s MV E velocity: 85.10 cm/s MV A velocity: 60.60 cm/s MV E/A ratio:  1.40 Buford Dresser MD Electronically signed by Buford Dresser MD Signature Date/Time: 03/18/2020/8:41:25 PM    Final     (Echo, Carotid, EGD, Colonoscopy, ERCP)    Subjective: No new complaints feels great.  Discharge Exam: Vitals:   04/07/20 0827 04/07/20 1006  BP:  100/62  Pulse:  (!) 101  Resp:  (!) 23  Temp:    SpO2: 94% 100%   Vitals:   04/07/20 0650 04/07/20 0736 04/07/20 0827 04/07/20 1006  BP: 95/68   100/62  Pulse: (!) 103   (!) 101  Resp: 17   (!) 23  Temp:  98.2 F (36.8 C)    TempSrc:  Oral    SpO2: 94%  94% 100%  Weight:      Height:        General: Pt is alert, awake, not in acute distress Cardiovascular: RRR, S1/S2 +, no rubs, no gallops Respiratory: CTA bilaterally, no wheezing, no rhonchi Abdominal: Soft, NT, ND, bowel sounds + Extremities: no edema, no cyanosis    The results of significant diagnostics from this hospitalization (including imaging, microbiology, ancillary and laboratory) are listed below for reference.     Microbiology: No results found for this or any previous visit (from the past 240 hour(s)).   Labs: BNP (last 3 results) Recent Labs    03/29/20 1511 03/31/20 0558 04/01/20 0615  BNP 1,862.9* 2,367.9* 4,414.0*   Basic  Metabolic Panel: Recent Labs  Lab 04/03/20 0637 04/04/20 0545 04/05/20 1641 04/06/20 0350 04/07/20 0518  NA 136 139 139 140 141  K 5.5* 4.8 4.4 3.3* 3.8  CL 90* 91* 87* 87* 84*  CO2 38* 41* 45* 46* 42*  GLUCOSE 170* 193* 110* 99 106*  BUN 45* 30* 19 13 12   CREATININE 1.09 0.89 0.74 0.69  0.72  CALCIUM 9.7 9.4 9.4 9.1 9.2   Liver Function Tests: No results for input(s): AST, ALT, ALKPHOS, BILITOT, PROT, ALBUMIN in the last 168 hours. No results for input(s): LIPASE, AMYLASE in the last 168 hours. No results for input(s): AMMONIA in the last 168 hours. CBC: No results for input(s): WBC, NEUTROABS, HGB, HCT, MCV, PLT in the last 168 hours. Cardiac Enzymes: No results for input(s): CKTOTAL, CKMB, CKMBINDEX, TROPONINI in the last 168 hours. BNP: Invalid input(s): POCBNP CBG: Recent Labs  Lab 04/04/20 1138  GLUCAP 242*   D-Dimer No results for input(s): DDIMER in the last 72 hours. Hgb A1c No results for input(s): HGBA1C in the last 72 hours. Lipid Profile No results for input(s): CHOL, HDL, LDLCALC, TRIG, CHOLHDL, LDLDIRECT in the last 72 hours. Thyroid function studies No results for input(s): TSH, T4TOTAL, T3FREE, THYROIDAB in the last 72 hours.  Invalid input(s): FREET3 Anemia work up No results for input(s): VITAMINB12, FOLATE, FERRITIN, TIBC, IRON, RETICCTPCT in the last 72 hours. Urinalysis    Component Value Date/Time   COLORURINE STRAW (A) 12/12/2018 1927   APPEARANCEUR CLEAR 12/12/2018 1927   LABSPEC 1.011 12/12/2018 1927   PHURINE 5.0 12/12/2018 1927   GLUCOSEU NEGATIVE 12/12/2018 1927   HGBUR NEGATIVE 12/12/2018 1927   BILIRUBINUR NEGATIVE 12/12/2018 [place] NEGATIVE 12/12/2018 1927   PROTEINUR NEGATIVE 12/12/2018 1927   UROBILINOGEN 0.2 08/06/2013 1436   NITRITE NEGATIVE 12/12/2018 1927   LEUKOCYTESUR NEGATIVE 12/12/2018 1927   Sepsis Labs Invalid input(s): PROCALCITONIN,  WBC,  LACTICIDVEN Microbiology No results found for this or any previous visit (from the past 240 hour(s)).   Time coordinating discharge: Over 40 minutes  SIGNED:   Charlynne Cousins, MD  [facility] 04/07/2020, 10:13 AM Pager   If 7PM-7AM, please contact night-coverage [email or web address] Password [email or web address]

## 2020-04-07 NOTE — Progress Notes (Addendum)
This chaplain was present for spiritual care F/U.  The Pt. states he feels a little better today. The chaplain learns from the Pt. his family visited at different times on Monday. The chaplain listens as the Pt. shares he doesn't think the family visits are connected with his feeling better today.  The Pt. accepts his family's challenges to being present with him.   The Pt. accepts the transition to SNF with the chaplain. The Pt. remains concerned about SNF in [place] when insurance is set up in [place].  This chaplain is available for F/U spiritual care as needed.

## 2020-04-07 NOTE — Progress Notes (Signed)
This RN wean patient off the HFL [place] to regular [place] on 5 liters, patient is saturating at a 90 to 92%. Been on it foe about 40 mins now.

## 2020-04-07 NOTE — Care Management Important Message (Signed)
Important Message  Patient Details  Name: [name] MRN: [id number] Date of Birth: 12/30/1960   Medicare Important Message Given:  Yes     Shelda Altes 04/07/2020, 10:40 AM

## 2020-04-07 NOTE — TOC Progression Note (Addendum)
Transition of Care [facility]) - Progression Note    Patient Details  Name: [name] MRN: [id number] Date of Birth: August 21, 1960  Transition of Care [facility] CM/SW [place], [place] Phone Number: 04/07/2020, 9:15 AM  Clinical Narrative:    Update-Pt being weaned down, currently on 4 liters [place]. CSW reached back out to [place] states he will review with DON and follow up with CSW.  CSW spoke with [name], Admissions Director of [facility]. [name] stated that pt would have to be weaned down to less than 4 liters of HFNC in order for them to accept pt. CSW reached out to [place] at [place] as they had accepted in the [place] said she would check with her DON to see if they could take pt on 11 liters HFNC. CSW will continue to follow.    Expected Discharge Plan: [place] Barriers to Discharge:  (does not have his key to his oxygen, friend has the key)  Expected Discharge Plan and Services Expected Discharge Plan: [place]   Discharge Planning Services: CM Consult Post Acute Care Choice: Home Health, Durable Medical Equipment Living arrangements for the past 2 months: Apartment Expected Discharge Date: 03/28/20               DME Arranged: Gilford Rile rolling with seat DME Agency: [employer] Date DME Agency Contacted: 03/28/20 Time DME Agency Contacted: [phone] Representative spoke with at DME Agency: Jodell Cipro HH Arranged: RN, PT, OT [facility] Agency: [employer] Date [place]: 03/28/20 Time [place]: 1608 Representative spoke with at [place]: [place] (SDOH) Interventions Food Insecurity Interventions: Intervention Not Indicated Financial Strain Interventions: Intervention Not Indicated Transportation Interventions: [facility]  Readmission Risk Interventions Readmission Risk Prevention Plan 03/28/2020  Transportation Screening Complete  HRI or  Home Care Consult Complete  Social Work Consult for [place] Planning/Counseling Complete  Palliative Care Screening Not Applicable  Medication Review Press photographer) Complete  Some recent data might be hidden

## 2020-04-07 NOTE — Progress Notes (Addendum)
Palliative Care Progress Note  [name] looks better this AM than he did last week when I saw him. He is in less distress, less wheezing on exam. He is also more interactive and less withdrawn. We discussed his current situation, prognosis and goals of care moving forward. He is deconditioned to the degree he will not be able to return to his home and care for himself and family is not able to provide the level of care he needs either in his home or their home. He knows that his lung cancer and severe heart failure and COPD are not reversibible and that treating his symptoms are our primary goal. He has diuresed significantly over the past few days and was also treated for COPD exacerbation and has made slow improvement. He will need fairly intensive medication management upon discharge including high O2 requirements, diuretics, anti-inflamatories, nebulizers and opioids to control his dyspnea. The current discharge plan is to find him a LTC bed closer to his family in [place] or [place] where he can also get hospice services who can oversee and help with his medication management. Patient has DNR and documents on file for ACP. No changes to current symptom management regimen.  Lane Hacker, DO Palliative Medicine  Time: 35 minutes Greater than 50%  of this time was spent counseling and coordinating care related to the above assessment and plan.

## 2020-04-07 NOTE — TOC Progression Note (Addendum)
Transition of Care [facility]) - Progression Note    Patient Details  Name: [name] MRN: [id number] Date of Birth: 1960-10-21  Transition of Care [facility]) CM/SW [place], [address] Phone Number: 04/07/2020, 3:43 PM  Clinical Narrative:    CSW followed up with both [place] and [facility], still no answer as far as whether or not they can take pt, under review with DON, advised both that pt's O2 level has decreased, now on 4 liters [place]. CSW also reached out to pt's daughter [name] via phone ([phone]) to update her on pt's dc progress. CSW will continue to follow.    Expected Discharge Plan: [place] Barriers to Discharge:  (does not have his key to his oxygen, friend has the key)  Expected Discharge Plan and Services Expected Discharge Plan: [place]   Discharge Planning Services: CM Consult Post Acute Care Choice: Home Health, Durable Medical Equipment Living arrangements for the past 2 months: Apartment Expected Discharge Date: 04/07/20               DME Arranged: Gilford Rile rolling with seat DME Agency: [employer] Date DME Agency Contacted: 03/28/20 Time DME Agency Contacted: [phone] Representative spoke with at DME Agency: Jodell Cipro HH Arranged: RN, PT, OT [facility] Agency: [employer] Date [place]: 03/28/20 Time [place]: 1608 Representative spoke with at [place]: Delma Post   Social Determinants of Health (SDOH) Interventions Food Insecurity Interventions: Intervention Not Indicated Financial Strain Interventions: Intervention Not Indicated Transportation Interventions: [facility]  Readmission Risk Interventions Readmission Risk Prevention Plan 03/28/2020  Transportation Screening Complete  HRI or Home Care Consult Complete  Social Work Consult for [place] Planning/Counseling [place] Not Applicable  Medication Review Press photographer) Complete  Some  recent data might be hidden

## 2020-04-07 NOTE — Progress Notes (Signed)
   04/06/20 1947  Assess: MEWS Score  Temp 98.5 F (36.9 C)  BP 92/65  Pulse Rate (!) 104  ECG Heart Rate (!) 105  Resp 16  Level of Consciousness Alert  SpO2 100 %  Assess: MEWS Score  MEWS Temp 0  MEWS Systolic 1  MEWS Pulse 1  MEWS RR 0  MEWS LOC 0  MEWS Score 2  MEWS Score Color Yellow  Assess: if the MEWS score is Yellow or Red  Were vital signs taken at a resting state? Yes  Focused Assessment No change from prior assessment  Early Detection of Sepsis Score *See Row Information* Low  MEWS guidelines implemented *See Row Information* Yes  Treat  Pain Scale 0-10  Pain Score 0  Take Vital Signs  Increase Vital Sign Frequency  Yellow: Q 2hr X 2 then Q 4hr X 2, if remains yellow, continue Q 4hrs  Escalate  MEWS: Escalate Yellow: discuss with charge nurse/RN and consider discussing with provider and RRT  Notify: Charge Nurse/RN  Name of Charge Nurse/RN Notified erica  Date Charge Nurse/RN Notified 04/06/20  Time Charge Nurse/RN Notified 2020  Notify: Provider  Provider Name/Title Dr. Cyd Silence  Date Provider Notified 04/06/20  Time Provider Notified 2050  Notification Type Page  Notification Reason Other (Comment)  Response See new orders  Date of Provider Response 04/06/20  Time of Provider Response 2108  Document  Progress note created (see row info) Yes   Spoke with provider. PT in no acute distress. New orders placed. MD note created.

## 2020-04-07 NOTE — Plan of Care (Signed)
  Problem: Activity: Goal: Risk for activity intolerance will decrease Outcome: Progressing   Problem: Pain Managment: Goal: General experience of comfort will improve Outcome: Progressing   Problem: Safety: Goal: Ability to remain free from injury will improve Outcome: Progressing   

## 2020-04-08 DIAGNOSIS — J449 Chronic obstructive pulmonary disease, unspecified: Secondary | ICD-10-CM

## 2020-04-08 LAB — CREATININE, SERUM
Creatinine, Ser: 0.8 mg/dL (ref 0.61–1.24)
GFR, Estimated: >60 mL/min (ref >60)

## 2020-04-08 NOTE — TOC Progression Note (Addendum)
Transition of Care [facility]) - Progression Note    Patient Details  Name: [name] MRN: [id number] Date of Birth: [birth year]  Transition of Care [facility]) CM/SW [place], [place] Phone Number: 04/08/2020, 2:37 PM  Clinical Narrative:    CSW reached out to [facility], they are not able to accept the patient due to the amount of oxygen pt is on, states they are not comfortable taking anything over 2 liters. CSW reached out to [place], spoke to [place] who was able to confirm that they could accept pt on tomorrow. CSW spoke with pt who agreed to placement. CSW called pt's daughter [name] and updated her on placement.   Expected Discharge Plan: [place] Barriers to Discharge:  (does not have his key to his oxygen, friend has the key)  Expected Discharge Plan and Services Expected Discharge Plan: [place]   Discharge Planning Services: CM Consult Post Acute Care Choice: Home Health, Durable Medical Equipment Living arrangements for the past 2 months: Apartment Expected Discharge Date: 04/07/20               DME Arranged: Gilford Rile rolling with seat DME Agency: [employer] Date DME Agency Contacted: 03/28/20 Time DME Agency Contacted: [phone] Representative spoke with at DME Agency: Jodell Cipro HH Arranged: RN, PT, OT [facility] Agency: [employer] Date [place]: 03/28/20 Time [place]: 1608 Representative spoke with at [place]: Delma Post   Social Determinants of Health (SDOH) Interventions Food Insecurity Interventions: Intervention Not Indicated Financial Strain Interventions: Intervention Not Indicated Transportation Interventions: [facility]  Readmission Risk Interventions Readmission Risk Prevention Plan 03/28/2020  Transportation Screening Complete  HRI or Home Care Consult Complete  Social Work Consult for [place] Planning/Counseling [place] Not Applicable   Medication Review Press photographer) Complete  Some recent data might be hidden

## 2020-04-08 NOTE — Progress Notes (Signed)
PROGRESS NOTE    [name]  OMA:[id number] DOB: March 30, 1961 DOA: 03/18/2020 PCP: [name], MD   Brief Narrative: [name] is a 59 y.o. male past medical history significant for coronary artery disease status post CABG, ischemic cardiomyopathy with an EF of 20% status post AICD placement, chronic hypoxic respiratory failure secondary to COPD on 4 L of oxygen, stage Ia non-small cell cancer diagnosed in February 2015 status post right lower lobe wedge resection as well as SBRT with a recurrent disease to the right lower lobe in June 2019 which has been in observation since then. Stage T1 adenocarcinoma of the prostate with a Gleason score of 4 diagnosed in November 2019 with a confirmed PET scan and biopsy of the right lower mass consistent with adenocarcinoma currently on chemotherapy, chemotherapy-induced anemia status post transfusion. Patient presented secondary to dyspnea and LE edema and found to have acute on chronic heart failure   Assessment & Plan:   Principal Problem:   Acute on chronic combined systolic and diastolic CHF (congestive heart failure) (HCC) Active Problems:   Coronary artery disease   Hyperlipidemia   Cardiomyopathy, ischemic   Right lower lobe lung mass   COPD (chronic obstructive pulmonary disease) (HCC)   Acute on chronic respiratory failure with hypoxia (HCC)   Drug-induced hypotension   ICD (implantable cardioverter-defibrillator) in place   Acute on chronic combined systolic (congestive) and diastolic (congestive) heart failure ([place])   Palliative care encounter   Chronic right-sided low back pain without sciatica   DNR (do not resuscitate)   Protein-calorie malnutrition ([place])   Acute on chronic  combined systolic and diastolic heart failure Patient was heavily diuresed with IV lasix. Oxygen use weaned down as mentioned below. Currently stable on Lasix 80 mg TID. Patient is s/p ICD. -Will continue Lasix 80 mg IV TID while inpatient and  transition to PO on discharge; discharge with potassium -Verify ICD is turned off prior to discharge -Entresto, Coreg discontinued  Acute respiratory failure with hypoxia Secondary to above. Patient needed as much as 15 L via HFNC and has now been weaned down to 5 via [place]. Now plan for SNF with hospice. -Continue oxygen -morphine prn for dyspnea/pain  Hyperkalemia Given Kayexalate with resolution.  CAD No chest pain. Hospice. Aspirin discontinued.  Primary hypertension Continue metoprolol and Lasix  Hyperlipidemia Lipitor discontinued.  Adenocarcinoma of the right lung Progressive disease. Not a candidate for chemotherapy secondary to severe deconditioning and heart failure. Palliative care consulted and patient desired hospice.  COPD No wheezing. Stable.    DVT prophylaxis: Lovenox Code Status:   Code Status: DNR Family Communication: None at bedside Disposition Plan: Discharge to SNF when bed available   Consultants:   Palliative care medicine  Cardiology   Subjective: No concerns today.  Objective: Vitals:   04/08/20 1124 04/08/20 1126 04/08/20 1400 04/08/20 1427  BP: 117/81     Pulse: 61  (!) 124 97  Resp: (!) 25  (!) 21 10  Temp: 97.8 F (36.6 C) 97.8 F (36.6 C)    TempSrc:  Oral    SpO2: 93%   100%  Weight:      Height:        Intake/Output Summary (Last 24 hours) at 04/08/2020 1440 Last data filed at 04/08/2020 1425 Gross per 24 hour  Intake 1240 ml  Output 1800 ml  Net -560 ml   Filed Weights   04/05/20 0701 04/06/20 0350 04/07/20 0051  Weight: 58.5 kg 58 kg 50.6 kg  Examination:  General exam: Appears calm and comfortable Respiratory system: Diminished. Respiratory effort normal. Cardiovascular system: S1 & S2 heard, RRR. No murmurs, rubs, gallops or clicks. Gastrointestinal system: Abdomen is nondistended, soft and nontender. No organomegaly or masses felt. Normal bowel sounds heard. Central nervous system: Alert and oriented.  No focal neurological deficits. Musculoskeletal: No calf tenderness Skin: No cyanosis. No rashes Psychiatry: Judgement and insight appear normal. Mood & affect appropriate.     Data Reviewed: I have personally reviewed following labs and imaging studies  CBC Lab Results  Component Value Date   WBC 5.7 03/31/2020   RBC 2.96 (L) 03/31/2020   HGB 8.7 (L) 03/31/2020   HCT 28.4 (L) 03/31/2020   MCV 95.9 03/31/2020   MCH 29.4 03/31/2020   PLT 464 (H) 03/31/2020   MCHC 30.6 03/31/2020   RDW 16.5 (H) 03/31/2020   LYMPHSABS 1.5 03/18/2020   MONOABS 0.9 03/18/2020   EOSABS 0.1 03/18/2020   BASOSABS 0.0 11/20/7626     Last metabolic panel Lab Results  Component Value Date   NA 141 04/07/2020   K 3.8 04/07/2020   CL 84 (L) 04/07/2020   CO2 42 (H) 04/07/2020   BUN 12 04/07/2020   CREATININE 0.80 04/08/2020   GLUCOSE 106 (H) 04/07/2020   GFRNONAA >60 04/08/2020   GFRAA >60 02/13/2020   CALCIUM 9.2 04/07/2020   PHOS 2.7 06/15/2015   PROT 7.4 03/31/2020   ALBUMIN 2.7 (L) 03/31/2020   BILITOT 0.5 03/31/2020   ALKPHOS 68 03/31/2020   AST 32 03/31/2020   ALT 38 03/31/2020   ANIONGAP 15 04/07/2020    CBG (last 3)  No results for input(s): GLUCAP in the last 72 hours.   GFR: Estimated Creatinine Clearance: 72.9 mL/min (by C-G formula based on SCr of 0.8 mg/dL).  Coagulation Profile: No results for input(s): INR, PROTIME in the last 168 hours.  No results found for this or any previous visit (from the past 240 hour(s)).      Radiology Studies: DG Chest 1 View  Result Date: 04/06/2020 CLINICAL DATA:  59 year old male with shortness of breath. EXAM: CHEST  1 VIEW COMPARISON:  Chest radiograph dated 04/04/2020. FINDINGS: Right-sided Port-A-Cath with tip at the cavoatrial junction. Diffuse chronic interstitial coarsening as seen on the prior radiograph. No new consolidation. No large pleural effusion or pneumothorax. Thickening of the left upper lobe pleural surface. Left  upper lobe cavitary lesion with peripheral air. There is stable cardiomegaly. Left pectoral AICD device. Median sternotomy wires and coronary stent. No acute osseous pathology. IMPRESSION: 1. No new consolidation. Overall no significant interval change in the appearance of the lungs compared to the prior radiograph. 2. Left upper lobe cavitary lesion with peripheral air. 3. Stable cardiomegaly. Electronically Signed   By: Anner Crete M.D.   On: 04/06/2020 22:45        Scheduled Meds: . acetaminophen  1,000 mg Oral TID  . ALPRAZolam  1 mg Oral QHS  . buPROPion  150 mg Oral Daily  . Chlorhexidine Gluconate Cloth  6 each Topical Daily  . diclofenac Sodium  4 g Topical Daily  . enoxaparin (LOVENOX) injection  40 mg Subcutaneous Q24H  . furosemide  80 mg Intravenous Q8H  . lidocaine  1 patch Transdermal Q24H  . mouth rinse  15 mL Mouth Rinse BID  . metoprolol succinate  12.5 mg Oral Daily  . mometasone-formoterol  2 puff Inhalation BID  . morphine CONCENTRATE  10 mg Oral Q4H  . senna-docusate  1  tablet Oral BID  . sodium chloride flush  10-40 mL Intracatheter Q12H  . umeclidinium bromide  1 puff Inhalation Daily   Continuous Infusions:   LOS: 21 days     Cordelia Poche, MD [facility] 04/08/2020, 2:40 PM  If 7PM-7AM, please contact night-coverage [email or web address]

## 2020-04-09 LAB — SARS CORONAVIRUS 2 BY RT PCR (HOSPITAL ORDER, PERFORMED IN ~~LOC~~ HOSPITAL LAB): SARS Coronavirus 2: NEGATIVE

## 2020-04-09 MED ORDER — MORPHINE SULFATE (CONCENTRATE) 10 MG/0.5ML PO SOLN: 10.0000 mg | ORAL | 0 refills | Status: DC | PRN

## 2020-04-09 MED ORDER — MORPHINE SULFATE (CONCENTRATE) 10 MG/0.5ML PO SOLN: 10.0000 mg | ORAL | Status: DC | PRN

## 2020-04-09 MED ORDER — FUROSEMIDE 40 MG PO TABS
40.0000 mg | ORAL_TABLET | Freq: Two times a day (BID) | ORAL | Status: DC
  Administered 2020-04-09 – 2020-04-10 (×2): 40 mg via ORAL
  Filled 2020-04-09 (×2): qty 1

## 2020-04-09 NOTE — Progress Notes (Signed)
   04/09/20 1922  Assess: MEWS Score  Temp 97.8 F (36.6 C)  BP 95/70  Pulse Rate (!) 101  Resp 17  Level of Consciousness Alert  SpO2 98 %  O2 Device Nasal Cannula  O2 Flow Rate (L/min) 5 L/min  Assess: MEWS Score  MEWS Temp 0  MEWS Systolic 1  MEWS Pulse 1  MEWS RR 0  MEWS LOC 0  MEWS Score 2  MEWS Score Color Yellow  Assess: if the MEWS score is Yellow or Red  Were vital signs taken at a resting state? Yes  Focused Assessment No change from prior assessment  Early Detection of Sepsis Score *See Row Information* Low  MEWS guidelines implemented *See Row Information* No, previously yellow, continue vital signs every 4 hours  Treat  Pain Scale 0-10  Pain Score 2  Pain Type Chronic pain  Pain Location Generalized  Pain Intervention(s) Rest  Take Vital Signs  Increase Vital Sign Frequency  Yellow: Q 2hr X 2 then Q 4hr X 2, if remains yellow, continue Q 4hrs (pt d/c am, cont on q4 vitals)  Escalate  MEWS: Escalate Yellow: discuss with charge nurse/RN and consider discussing with provider and RRT  Notify: Charge Nurse/RN  Name of Charge Nurse/RN Notified [place]  Date Charge Nurse/RN Notified 04/09/20  Time Charge Nurse/RN Notified 1922    Pt mews cont to score yellow. Pt is table and MD/charge nurse aware of BP. Will d/c to SNF with hospice care in am

## 2020-04-09 NOTE — Progress Notes (Signed)
Paged On call MD about pt soft bp. Tele also called for pt increasing ST elevation. Will hold all am morphine and lasix until morning MDs eval.

## 2020-04-09 NOTE — Progress Notes (Addendum)
PROGRESS NOTE    [name]  HYQ:[id number] DOB: Mar 22, 1961 DOA: 03/18/2020 PCP: [name], MD   Brief Narrative: [name] is a 59 y.o. male past medical history significant for coronary artery disease status post CABG, ischemic cardiomyopathy with an EF of 20% status post AICD placement, chronic hypoxic respiratory failure secondary to COPD on 4 L of oxygen, stage Ia non-small cell cancer diagnosed in February 2015 status post right lower lobe wedge resection as well as SBRT with a recurrent disease to the right lower lobe in June 2019 which has been in observation since then. Stage T1 adenocarcinoma of the prostate with a Gleason score of 4 diagnosed in November 2019 with a confirmed PET scan and biopsy of the right lower mass consistent with adenocarcinoma currently on chemotherapy, chemotherapy-induced anemia status post transfusion. Patient presented secondary to dyspnea and LE edema and found to have acute on chronic heart failure   Assessment & Plan:   Principal Problem:   Acute on chronic combined systolic and diastolic CHF (congestive heart failure) (HCC) Active Problems:   Coronary artery disease   Hyperlipidemia   Cardiomyopathy, ischemic   Right lower lobe lung mass   COPD (chronic obstructive pulmonary disease) (HCC)   Acute on chronic respiratory failure with hypoxia (HCC)   Drug-induced hypotension   ICD (implantable cardioverter-defibrillator) in place   Acute on chronic combined systolic (congestive) and diastolic (congestive) heart failure ([place])   Palliative care encounter   Chronic right-sided low back pain without sciatica   DNR (do not resuscitate)   Protein-calorie malnutrition ([place])   Acute on chronic  combined systolic and diastolic heart failure Patient was heavily diuresed with IV lasix. Oxygen use weaned down as mentioned below. Currently stable on Lasix 80 mg TID. Patient is s/p ICD which was deactivated on 11/6. -Transition to Lasix 40 mg  PO BID -Entresto, Coreg discontinued  Acute respiratory failure with hypoxia Secondary to above. Patient needed as much as 15 L via [facility] and has now been weaned down to 5 via [place]. Now plan for SNF with hospice. -Continue oxygen -morphine prn for dyspnea/pain  Hyperkalemia Given Kayexalate with resolution.  CAD No chest pain. Hospice. Aspirin discontinued.  Primary hypertension Continue metoprolol and Lasix  Hyperlipidemia Lipitor discontinued.  Adenocarcinoma of the right lung Progressive disease. Not a candidate for chemotherapy secondary to severe deconditioning and heart failure. Palliative care consulted and patient desired hospice.  COPD No wheezing. Stable.  Hypotension Secondary to Lasix and morphine most likely. -Switch to oral lasix and change to PRN oral morphine   DVT prophylaxis: Lovenox Code Status:   Code Status: DNR Family Communication: None at bedside Disposition Plan: Discharge to SNF in 24 hours when SNF able to accept   Consultants:   Palliative care medicine  Cardiology   Subjective: No dyspnea or chest pain.  Objective: Vitals:   04/09/20 0716 04/09/20 0835 04/09/20 1018 04/09/20 1116  BP:      Pulse:      Resp:      Temp: 97.7 F (36.5 C)  (!) 97.5 F (36.4 C) 97.9 F (36.6 C)  TempSrc: Oral  Oral Oral  SpO2:  95%    Weight:      Height:        Intake/Output Summary (Last 24 hours) at 04/09/2020 1446 Last data filed at 04/09/2020 1306 Gross per 24 hour  Intake 1595 ml  Output 1526 ml  Net 69 ml   Filed Weights   04/06/20 0350  04/07/20 0051 04/09/20 0404  Weight: 58 kg 50.6 kg 52.9 kg    Examination:  General exam: Appears calm and comfortable Respiratory system: Left sided rales/rhonchi. Respiratory effort normal. Cardiovascular system: S1 & S2 heard, RRR. No murmurs, rubs, gallops or clicks. Gastrointestinal system: Abdomen is nondistended, soft and nontender. No organomegaly or masses felt. Normal bowel sounds  heard. Central nervous system: Alert and oriented. No focal neurological deficits. Musculoskeletal: No calf tenderness Skin: No cyanosis. No rashes Psychiatry: Judgement and insight appear normal. Mood & affect appropriate.     Data Reviewed: I have personally reviewed following labs and imaging studies  CBC Lab Results  Component Value Date   WBC 5.7 03/31/2020   RBC 2.96 (L) 03/31/2020   HGB 8.7 (L) 03/31/2020   HCT 28.4 (L) 03/31/2020   MCV 95.9 03/31/2020   MCH 29.4 03/31/2020   PLT 464 (H) 03/31/2020   MCHC 30.6 03/31/2020   RDW 16.5 (H) 03/31/2020   LYMPHSABS 1.5 03/18/2020   MONOABS 0.9 03/18/2020   EOSABS 0.1 03/18/2020   BASOSABS 0.0 42/35/3614     Last metabolic panel Lab Results  Component Value Date   NA 141 04/07/2020   K 3.8 04/07/2020   CL 84 (L) 04/07/2020   CO2 42 (H) 04/07/2020   BUN 12 04/07/2020   CREATININE 0.80 04/08/2020   GLUCOSE 106 (H) 04/07/2020   GFRNONAA >60 04/08/2020   GFRAA >60 02/13/2020   CALCIUM 9.2 04/07/2020   PHOS 2.7 06/15/2015   PROT 7.4 03/31/2020   ALBUMIN 2.7 (L) 03/31/2020   BILITOT 0.5 03/31/2020   ALKPHOS 68 03/31/2020   AST 32 03/31/2020   ALT 38 03/31/2020   ANIONGAP 15 04/07/2020    CBG (last 3)  No results for input(s): GLUCAP in the last 72 hours.   GFR: Estimated Creatinine Clearance: 76.2 mL/min (by C-G formula based on SCr of 0.8 mg/dL).  Coagulation Profile: No results for input(s): INR, PROTIME in the last 168 hours.  Recent Results (from the past 240 hour(s))  SARS Coronavirus 2 by RT PCR (hospital order, performed in [facility] hospital lab) Nasopharyngeal Nasopharyngeal Swab     Status: None   Collection Time: 04/09/20 10:08 AM   Specimen: Nasopharyngeal Swab  Result Value Ref Range Status   SARS Coronavirus 2 NEGATIVE NEGATIVE Final    Comment: (NOTE) SARS-CoV-2 target nucleic acids are NOT DETECTED.  The SARS-CoV-2 RNA is generally detectable in upper and lower respiratory specimens  during the acute phase of infection. The lowest concentration of SARS-CoV-2 viral copies this assay can detect is 250 copies / mL. A negative result does not preclude SARS-CoV-2 infection and should not be used as the sole basis for treatment or other patient management decisions.  A negative result may occur with improper specimen collection / handling, submission of specimen other than nasopharyngeal swab, presence of viral mutation(s) within the areas targeted by this assay, and inadequate number of viral copies (<250 copies / mL). A negative result must be combined with clinical observations, patient history, and epidemiological information.  Fact Sheet for Patients:   StrictlyIdeas.no  Fact Sheet for Healthcare Providers: BankingDealers.co.za  This test is not yet approved or  cleared by the [place] FDA and has been authorized for detection and/or diagnosis of SARS-CoV-2 by FDA under an Emergency Use Authorization (EUA).  This EUA will remain in effect (meaning this test can be used) for the duration of the COVID-19 declaration under Section 564(b)(1) of the Act, 21 U.S.C. section  360bbb-3(b)(1), unless the authorization is terminated or revoked sooner.  Performed at [facility], [place] [address], [place],  [address]         Radiology Studies: No results found.      Scheduled Meds: . acetaminophen  1,000 mg Oral TID  . ALPRAZolam  1 mg Oral QHS  . buPROPion  150 mg Oral Daily  . Chlorhexidine Gluconate Cloth  6 each Topical Daily  . diclofenac Sodium  4 g Topical Daily  . enoxaparin (LOVENOX) injection  40 mg Subcutaneous Q24H  . furosemide  40 mg Oral BID  . lidocaine  1 patch Transdermal Q24H  . mouth rinse  15 mL Mouth Rinse BID  . metoprolol succinate  12.5 mg Oral Daily  . mometasone-formoterol  2 puff Inhalation BID  . senna-docusate  1 tablet Oral BID  . sodium chloride flush  10-40 mL  Intracatheter Q12H  . umeclidinium bromide  1 puff Inhalation Daily   Continuous Infusions:   LOS: 22 days     Cordelia Poche, MD [facility] 04/09/2020, 2:46 PM  If 7PM-7AM, please contact night-coverage [email or web address]

## 2020-04-09 NOTE — Discharge Summary (Signed)
Physician Discharge Summary  [name] FKC:[id number] DOB: [birth year] DOA: 03/18/2020  PCP: [name], MD  Admit date: 03/18/2020 Discharge date: 04/10/2020  Admitted From: Home Disposition: SNF with hosipce  Recommendations for Outpatient Follow-up:   Symptom management/Hospice care  Discharge Condition: Hospice CODE STATUS: DNR Diet recommendation: Preferably low salt, however, can be liberal since patient is now under hospice care   Brief/Interim Summary:  Admission HPI written by Mckinley Jewel, MD   Chief Complaint: Shortness of breath since 2 weeks  HPI: [name] is a 59 y.o. male with medical history significant of coronary artery disease status post CABG, ischemic cardiomyopathy with ejection fraction of 20 to 25% status post ICD placement, chronic hypoxemic respiratory failure secondary to underlying COPD-on 4 L of oxygen via nasal cannulae at home, recurrent right lung cancer-currently getting systemic chemotherapy, chemotherapy-induced anemia status post recent PRBC transfusion, prostate cancer presents to emergency department with shortness of breath since 2 weeks.  Patient tells me that that he has shortness of breath since 2 weeks, which is getting worse, associated with leg swelling, orthopnea, PND and weight gain of 20 pounds in last 2 months.  Denies chest pain, fever, chills, cough, congestion, nausea, vomiting, abdominal pain, urinary or bowel changes.  He is compliant with his home medication however he eats high sodium diet.  He cooks for himself and eats bacon/red meat. "I like salt in my food".  He is on 4 L of oxygen via nasal cannulae at home.  Denies current cigarette smoking, alcohol, illicit drug use.  Has history of non-small cell lung cancer diagnosed in February 2015,  Recurrent lung cancer diagnosed in September 2020 currently getting chemotherapy every 3 weeks.  Followed by oncology Dr. [name].  Prostate adenocarcinoma diagnosed in  November 2019-followed by Dr. [name]. He was transfused PRBC yesterday due to chemotherapy-induced anemia.  He is fully vaccinated against COVID-19.  He tells me that he admitted at [facility] for pneumonia and received antibiotics.  Hospital course:  Acute on chronic  combined systolic and diastolic heart failure Patient was heavily diuresed with IV lasix. Oxygen use weaned down as mentioned below. Currently stable on Lasix 80 mg TID. Patient is s/p ICD. Managed with Lasix 80 mg IV TID while inpatient and transitioned to PO on discharge; discharged with potassium supplementation. Entresto, Coreg discontinued. [employer] AICD deactivated on 11/6.  Acute respiratory failure with hypoxia Secondary to above. Patient needed as much as 15 L via HFNC and has now been weaned down to 5 via [place]. Now plan for SNF with hospice. Discharge with morphine solution as needed for pain and dyspnea.  Hyperkalemia Given Kayexalate with resolution.  CAD No chest pain. Hospice. Aspirin discontinued.  Primary hypertension Continue metoprolol and Lasix  Hyperlipidemia Lipitor discontinued.  Adenocarcinoma of the right lung Progressive disease. Not a candidate for chemotherapy secondary to severe deconditioning and heart failure. Palliative care consulted and patient desired hospice.  COPD No wheezing. Stable. Continue Advair, Spiriva and ipratropium. Xopenex on discharge.   Discharge Diagnoses:  Principal Problem:   Acute on chronic combined systolic and diastolic CHF (congestive heart failure) (HCC) Active Problems:   Coronary artery disease   Hyperlipidemia   Cardiomyopathy, ischemic   Right lower lobe lung mass   COPD (chronic obstructive pulmonary disease) (HCC)   Acute on chronic respiratory failure with hypoxia (HCC)   Drug-induced hypotension   ICD (implantable cardioverter-defibrillator) in place   Acute on chronic combined systolic (congestive) and diastolic  (  congestive) heart failure (HCC)   Palliative care encounter   Chronic right-sided low back pain without sciatica   DNR (do not resuscitate)   Protein-calorie malnutrition [facility])    Discharge Instructions  Discharge Instructions    (HEART FAILURE PATIENTS) Call MD:  Anytime you have any of the following symptoms: 1) 3 pound weight gain in 24 hours or 5 pounds in 1 week 2) shortness of breath, with or without a dry hacking cough 3) swelling in the hands, feet or stomach 4) if you have to sleep on extra pillows at night in order to breathe.   Complete by: As directed    Diet - low sodium heart healthy   Complete by: As directed    Increase activity slowly   Complete by: As directed    Increase activity slowly   Complete by: As directed      Allergies as of 04/10/2020   No Known Allergies     Medication List    STOP taking these medications   aspirin EC 81 MG tablet   atorvastatin 40 MG tablet Commonly known as: LIPITOR   buPROPion 150 MG 12 hr tablet Commonly known as: WELLBUTRIN SR   carvedilol 3.125 MG tablet Commonly known as: COREG   Entresto 24-26 MG Generic drug: sacubitril-valsartan   predniSONE 10 MG tablet Commonly known as: DELTASONE     TAKE these medications   acetaminophen 325 MG tablet Commonly known as: TYLENOL Take 2 tablets (650 mg total) by mouth every 4 (four) hours as needed for headache or mild pain.   ALPRAZolam 1 MG tablet Commonly known as: XANAX Take 1 tablet (1 mg total) by mouth at bedtime.   ALPRAZolam 1 MG tablet Commonly known as: XANAX Take 1 tablet (1 mg total) by mouth 3 (three) times daily as needed for anxiety.   diclofenac Sodium 1 % Gel Commonly known as: VOLTAREN Apply 4 g topically daily.   Fluticasone-Salmeterol 250-50 MCG/DOSE Aepb Commonly known as: Advair Diskus Inhale 1 puff into the lungs 2 (two) times daily.   folic acid 1 MG tablet Commonly known as: FOLVITE Take 1 tablet (1 mg total) by mouth daily.     furosemide 40 MG tablet Commonly known as: Lasix Take 1 tablet (40 mg total) by mouth 2 (two) times daily.   furosemide 40 MG tablet Commonly known as: LASIX Take 2 tablets (80 mg total) by mouth daily.   ipratropium 0.02 % nebulizer solution Commonly known as: ATROVENT Take 3 mLs by nebulization every 6 (six) hours as needed for wheezing or shortness of breath.   levalbuterol 0.63 MG/3ML nebulizer solution Commonly known as: XOPENEX Take 3 mLs (0.63 mg total) by nebulization every 6 (six) hours as needed for wheezing or shortness of breath.   lidocaine 5 % Commonly known as: LIDODERM Place 1 patch onto the skin daily. Remove & Discard patch within 12 hours or as directed by MD   lidocaine-prilocaine cream Commonly known as: EMLA Apply 1 application topically as needed.   metoprolol succinate 25 MG 24 hr tablet Commonly known as: TOPROL-XL Take 1 tablet (25 mg total) by mouth in the morning and at bedtime.   morphine CONCENTRATE 10 MG/0.5ML Soln concentrated solution Take 0.5 mLs (10 mg total) by mouth every 4 (four) hours as needed for severe pain or shortness of breath.   potassium chloride SA 20 MEQ tablet Commonly known as: KLOR-CON Take 1 tablet (20 mEq total) by mouth daily. What changed: Another medication with the same  name was removed. Continue taking this medication, and follow the directions you see here.   prochlorperazine 10 MG tablet Commonly known as: COMPAZINE TAKE 1 TABLET BY MOUTH EVERY 6 HOURS AS NEEDED FOR NAUSEA FOR VOMITING What changed: See the new instructions.   Spiriva HandiHaler 18 MCG inhalation capsule Generic drug: tiotropium INHALE THE CONTENTS OF 1 CAPSULE EVERY DAY What changed:   how much to take  how to take this  when to take this  additional instructions            Durable Medical Equipment  (From admission, onward)         Start     Ordered   03/28/20 1555  For home use only DME 4 wheeled rolling walker with seat   Once       Comments: With oxygen tank holder  Question:  Patient needs a walker to treat with the following condition  Answer:  Weakness   03/28/20 1559          Contact information for follow-up providers    [facility] [place] Follow up.   Why: HHRN,HHPT, HHOT Contact information: [address] [place] [address] [place] Follow up.   Why: rollator Contact information: [place] [address] [phone]            Contact information for after-discharge care    [facility] .   Service: Skilled Nursing Contact information: [address] [place] [phone]                 No Known Allergies  Consultations:  Palliative care medicine  Cardiology   Procedures/Studies: DG Chest 1 View  Result Date: 04/06/2020 CLINICAL DATA:  59 year old male with shortness of breath. EXAM: CHEST  1 VIEW COMPARISON:  Chest radiograph dated 04/04/2020. FINDINGS: Right-sided Port-A-Cath with tip at the cavoatrial junction. Diffuse chronic interstitial coarsening as seen on the prior radiograph. No new consolidation. No large pleural effusion or pneumothorax. Thickening of the left upper lobe pleural surface. Left upper lobe cavitary lesion with peripheral air. There is stable cardiomegaly. Left pectoral AICD device. Median sternotomy wires and coronary stent. No acute osseous pathology. IMPRESSION: 1. No new consolidation. Overall no significant interval change in the appearance of the lungs compared to the prior radiograph. 2. Left upper lobe cavitary lesion with peripheral air. 3. Stable cardiomegaly. Electronically Signed   By: Anner Crete M.D.   On: 04/06/2020 22:45   DG Chest 1 View  Result Date: 03/29/2020 CLINICAL DATA:  Short of breath.  History of lung carcinoma. EXAM: CHEST  1 VIEW COMPARISON:  03/18/2020 and earlier exams.  FINDINGS: Stable changes from previous cardiac surgery. Left coronary artery stents. Masslike opacity superimposed over the right hilum is stable. No convincing mediastinal masses. Left hemithorax volume loss with mediastinal shift to the left. There is consolidation in the left lung most apparent in the upper lung, as well as linear/reticular type opacities, stable from the prior study. On the right, patchy airspace opacity is noted in the mid and lower lung superimposed on chronic interstitial thickening. The lung is hyperexpanded. No pneumothorax. Stable left anterior chest wall AICD and right anterior chest wall Port-A-Cath. IMPRESSION: 1. New patchy airspace opacity in the right mid to lower lung superimposed on chronic interstitial thickening. New lung opacities are suspicious for superimposed pneumonia. 2. No other change from  the prior exam. Extensive chronic changes as detailed. Electronically Signed   By: Lajean Manes M.D.   On: 03/29/2020 08:39   CT ANGIO CHEST PE W OR WO CONTRAST  Result Date: 03/30/2020 CLINICAL DATA:  Pulmonary fibrosis, lung cancer, dyspnea EXAM: CT ANGIOGRAPHY CHEST WITH CONTRAST TECHNIQUE: Multidetector CT imaging of the chest was performed using the standard protocol during bolus administration of intravenous contrast. Multiplanar CT image reconstructions and MIPs were obtained to evaluate the vascular anatomy. CONTRAST:  147mL OMNIPAQUE IOHEXOL 350 MG/ML SOLN COMPARISON:  01/06/2020 FINDINGS: Cardiovascular: There is excellent opacification of the pulmonary arterial tree. There is no intraluminal filling defect identified to suggest acute pulmonary embolism. The central pulmonary arteries are enlarged in keeping with changes of pulmonary arterial hypertension. Coronary artery bypass grafting has been performed with stenting within a left anterior descending saphenous vein graft. There is mild global cardiomegaly with moderate left ventricular dilation and superimposed mild  left ventricular hypertrophy. No pericardial effusion. Left subclavian single lead pacemaker is seen with its lead within the right ventricle. Right internal jugular chest port tip is seen within the right atrium. The thoracic aorta demonstrates mild atherosclerotic calcification, but is otherwise unremarkable. Mediastinum/Nodes: Thyroid unremarkable. Pathologic pre-vascular mediastinal adenopathy is again identified exact measurement is difficult due to streak artifact from overlying pacemaker, however, the index lymph node demonstrates progressive enlargement, measuring 2.0 x 2.8 cm at axial image # 47/5. Right hilar adenopathy has progressed with the index lymph node within the right hilum measuring 1.7 x 2.1 cm at axial image # 63/5. thyroid unremarkable. Esophagus unremarkable. Lungs/Pleura: Severe, end-stage cystic lung disease within the left apex with associated left-sided volume loss is again identified with probable 5 cm aspergillosis a again identified at the left apex. Moderate severe centrilobular paraseptal emphysema. There is interval development of diffuse ground-glass pulmonary infiltrate throughout the residual pulmonary parenchyma and new consolidation at the right lung base subpleurally, likely infectious in the appropriate clinical setting. Alternatively, diffuse infiltrate could relate to pulmonary edema with consolidation at the right lung base representing superimposed infection should or changes of radiation pneumonitis in the appropriate clinical setting. Pleural base mass at the right lung base demonstrates interval increase in size measuring 3.7 x 4.4 cm at axial image # 99/11 Upper Abdomen: Extensive reflux of contrast into the hepatic venous system secondary to at least some degree of right heart failure. No acute abnormality. Musculoskeletal: No acute bone abnormality. Review of the MIP images confirms the above findings. IMPRESSION: Interval development of diffuse ground-glass  pulmonary infiltrate and right basilar subpleural consolidation. This may relate to diffuse infection or, more likely, diffuse pulmonary edema with superimposed infectious or post radiation change at the right lung base. 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 Superimposed moderate emphysema. Interval disease progression with enlargement of the neoplastic mass at the right lung base and developing right hilar adenopathy. Progressive pre-vascular adenopathy of unclear etiology. Moderate left ventricular dilation. Reflux of contrast into the hepatic venous vasculature in keeping with at least some degree of right heart failure. No pulmonary embolism. Aortic Atherosclerosis (ICD10-I70.0) and Emphysema (ICD10-J43.9). Electronically Signed   By: Fidela Salisbury MD   On: 03/30/2020 22:33   DG CHEST PORT 1 VIEW  Result Date: 04/04/2020 CLINICAL DATA:  Dyspnea, follow-up lung opacities, history of right lung cancer EXAM: PORTABLE CHEST 1 VIEW COMPARISON:  03/29/2020 chest radiograph. FINDINGS: Right internal jugular Port-A-Cath terminates over the cavoatrial junction. Stable configuration of single lead left subclavian ICD.  Intact median sternotomy wires. Stable cardiomediastinal silhouette with mild cardiomegaly. No pneumothorax. Chronic bilateral costophrenic angle blunting. Chronic dense consolidation, volume loss and distortion in upper left lung with associated bronchiectasis and chronic mycetoma as seen on prior chest CT. Patchy opacity throughout both lungs, slightly worsened in the upper right lung and improved at the right lung base. Known spiculated right retrocardiac mass. IMPRESSION: 1. Patchy opacity throughout both lungs, slightly worsened in the upper right lung and improved at the right lung base, favor multifocal pneumonia, with a component of pulmonary edema not excluded. 2. Known spiculated right retrocardiac lung mass. 3. Chronic dense consolidation, volume loss  and distortion in the upper left lung with associated bronchiectasis and chronic mycetoma as seen on prior chest CT. Electronically Signed   By: Ilona Sorrel M.D.   On: 04/04/2020 15:58   DG Chest Portable 1 View  Result Date: 03/18/2020 CLINICAL DATA:  Shortness of breath, lung cancer.  Swelling in legs. EXAM: PORTABLE CHEST 1 VIEW COMPARISON:  Chest radiograph 03/05/2019.  Chest CT 01/06/2020 FINDINGS: Similar opacification left lung apex with interspersed lucencies, characterized as bronchiectasis, chronic lung disease, and probable large aspirate alone a on prior CT. Right lower lobe mass was better characterized on recent CT. There is new mild diffuse interstitial prominence, suggestive of mild interstitial edema. Pulmonary vascular congestion. Suspected small bilateral pleural effusions. No discernible pneumothorax. Right Port-A-Cath with the tip projecting at the cavoatrial junction. Left subclavian approach cardiac rhythm maintenance device. IMPRESSION: 1. New mild diffuse interstitial prominence, pulmonary vascular congestion, and suspected small bilateral pleural effusions, possibly related to mild pulmonary edema. Atypical pneumonia is a consideration. 2. Right lower lobe mass was better characterized on recent CT. 3. Similar chronic changes in the left lung apex, as detailed above. Electronically Signed   By: Margaretha Sheffield MD   On: 03/18/2020 09:38   ECHOCARDIOGRAM COMPLETE  Result Date: 03/18/2020    ECHOCARDIOGRAM REPORT   Patient Name:   [facility] Date of Exam: 03/18/2020 Medical Rec #:  [id number]     Height:       68.0 in Accession #:    [id number]    Weight:       140.5 lb Date of Birth:  1961-01-04     BSA:          1.759 m Patient Age:    2 years      BP:           132/76 mmHg Patient Gender: M             HR:           115 bpm. Exam Location:  Inpatient Procedure: 2D Echo Indications:    CHF 428  History:        Patient has prior history of Echocardiogram examinations, most                  recent 03/01/2018. CHF and Cardiomyopathy, CAD and Previous                 Myocardial Infarction, Defibrillator and Prior CABG, COPD; Risk                 Factors:Dyslipidemia and Former Smoker. AICD. STEMI.  Sonographer:    Jannett Celestine RDCS (AE) Referring Phys: [id number] Michell Heinrich PAHWANI IMPRESSIONS  1. Left ventricular ejection fraction, by estimation, is <20%. The left ventricle has severely decreased function. The left ventricle demonstrates regional wall motion abnormalities (see scoring diagram/findings for description).  The left ventricular internal cavity size was moderately to severely dilated. There is mild concentric left ventricular hypertrophy. Left ventricular diastolic function could not be evaluated. There is diffuse severe hypokinesis with akinesis of the distal to apical segments  of all walls.  2. Right ventricular systolic function was not well visualized. The right ventricular size is not well visualized.  3. The mitral valve is normal in structure. Mild to moderate mitral valve regurgitation.  4. The aortic valve is grossly normal. Aortic valve regurgitation is mild. No aortic stenosis is present. Comparison(s): Prior images unable to be directly viewed, comparison made by report only. Conclusion(s)/Recommendation(s): Severely reduced LVEF with both global and focal wall motion abnormalities. FINDINGS  Left Ventricle: LV apical thrombus excluded by contrast. Left ventricular ejection fraction, by estimation, is <20%. The left ventricle has severely decreased function. The left ventricle demonstrates regional wall motion abnormalities. The left ventricular internal cavity size was moderately to severely dilated. There is mild concentric left ventricular hypertrophy. Left ventricular diastolic function could not be evaluated.  LV Wall Scoring: There is diffuse severe hypokinesis with akinesis of the distal to apical segments of all walls. Right Ventricle: The right ventricular size is  not well visualized. Right vetricular wall thickness was not assessed. Right ventricular systolic function was not well visualized. Left Atrium: Left atrial size was not assessed. Right Atrium: Right atrial size was not assessed. Pericardium: There is no evidence of pericardial effusion. Mitral Valve: The mitral valve is normal in structure. Mild to moderate mitral valve regurgitation. Tricuspid Valve: The tricuspid valve is normal in structure. Tricuspid valve regurgitation is trivial. No evidence of tricuspid stenosis. Aortic Valve: The aortic valve is grossly normal. Aortic valve regurgitation is mild. No aortic stenosis is present. Pulmonic Valve: The pulmonic valve was grossly normal. Pulmonic valve regurgitation is not visualized. Aorta: The aortic root is normal in size and structure. Venous: IVC assessment for right atrial pressure unable to be performed due to mechanical ventilation. IAS/Shunts: The interatrial septum was not assessed. Additional Comments: A pacer wire is visualized in the right atrium and right ventricle.  LEFT VENTRICLE PLAX 2D LVIDd:         6.20 cm LVIDs:         5.20 cm LV PW:         1.20 cm LV IVS:        1.10 cm LVOT diam:     2.10 cm LV SV:         30 LV SV Index:   17 LVOT Area:     3.46 cm  LEFT ATRIUM         Index LA diam:    3.80 cm 2.16 cm/m  AORTIC VALVE LVOT Vmax:   59.70 cm/s LVOT Vmean:  44.200 cm/s LVOT VTI:    0.087 m  AORTA Ao Root diam: 3.30 cm MITRAL VALVE MV Area (PHT): 4.68 cm    SHUNTS MV Decel Time: 162 msec    Systemic VTI:  0.09 m MR Peak grad: 67.2 mmHg    Systemic Diam: 2.10 cm MR Mean grad: 41.0 mmHg MR Vmax:      410.00 cm/s MR Vmean:     300.0 cm/s MV E velocity: 85.10 cm/s MV A velocity: 60.60 cm/s MV E/A ratio:  1.40 Buford Dresser MD Electronically signed by Buford Dresser MD Signature Date/Time: 03/18/2020/8:41:25 PM    Final      Subjective: No medical concerns. No chest pain or dyspnea.   Discharge Exam:  Vitals:   04/10/20  0400 04/10/20 0800  BP:  98/76  Pulse: 93   Resp:  (!) 21  Temp: 98 F (36.7 C)   SpO2: 100%    Vitals:   04/09/20 2359 04/10/20 0400 04/10/20 0500 04/10/20 0800  BP:    98/76  Pulse: (!) 102 93    Resp: 20   (!) 21  Temp: (!) 97.5 F (36.4 C) 98 F (36.7 C)    TempSrc: Oral Oral    SpO2:  100%    Weight:   53.8 kg   Height:        General exam: Appears calm and comfortable. Thin appearing. Respiratory system: Some left sided rhonchi. Wheezing on left side as well. Respiratory effort normal. Cardiovascular system: S1 & S2 heard, RRR. No murmurs, rubs, gallops or clicks. Gastrointestinal system: Abdomen is nondistended, soft and nontender. No organomegaly or masses felt. Normal bowel sounds heard. Central nervous system: Alert and oriented. No focal neurological deficits. Musculoskeletal:  No calf tenderness Skin: No cyanosis. No rashes Psychiatry: Judgement and insight appear normal. Blunt affect   The results of significant diagnostics from this hospitalization (including imaging, microbiology, ancillary and laboratory) are listed below for reference.     Microbiology: Recent Results (from the past 240 hour(s))  SARS Coronavirus 2 by RT PCR (hospital order, performed in [facility] hospital lab) Nasopharyngeal Nasopharyngeal Swab     Status: None   Collection Time: 04/09/20 10:08 AM   Specimen: Nasopharyngeal Swab  Result Value Ref Range Status   SARS Coronavirus 2 NEGATIVE NEGATIVE Final    Comment: (NOTE) SARS-CoV-2 target nucleic acids are NOT DETECTED.  The SARS-CoV-2 RNA is generally detectable in upper and lower respiratory specimens during the acute phase of infection. The lowest concentration of SARS-CoV-2 viral copies this assay can detect is 250 copies / mL. A negative result does not preclude SARS-CoV-2 infection and should not be used as the sole basis for treatment or other patient management decisions.  A negative result may occur with improper  specimen collection / handling, submission of specimen other than nasopharyngeal swab, presence of viral mutation(s) within the areas targeted by this assay, and inadequate number of viral copies (<250 copies / mL). A negative result must be combined with clinical observations, patient history, and epidemiological information.  Fact Sheet for Patients:   StrictlyIdeas.no  Fact Sheet for Healthcare Providers: BankingDealers.co.za  This test is not yet approved or  cleared by the [place] FDA and has been authorized for detection and/or diagnosis of SARS-CoV-2 by FDA under an Emergency Use Authorization (EUA).  This EUA will remain in effect (meaning this test can be used) for the duration of the COVID-19 declaration under Section 564(b)(1) of the Act, 21 U.S.C. section 360bbb-3(b)(1), unless the authorization is terminated or revoked sooner.  Performed at [facility], [place] [address] [address] [place], [place] [address]      Labs: BNP (last 3 results) Recent Labs    03/29/20 1511 03/31/20 0558 04/01/20 0615  BNP 1,862.9* 2,367.9* 0,100.7*   Basic Metabolic Panel: Recent Labs  Lab 04/04/20 0545 04/05/20 1641 04/06/20 0350 04/07/20 0518 04/08/20 0455  NA 139 139 140 141  --   K 4.8 4.4 3.3* 3.8  --   CL 91* 87* 87* 84*  --   CO2 41* 45* 46* 42*  --   GLUCOSE 193* 110* 99 106*  --   BUN 30* 19 13 12   --   CREATININE 0.89 0.74 0.69  0.72 0.80  CALCIUM 9.4 9.4 9.1 9.2  --    Liver Function Tests: No results for input(s): AST, ALT, ALKPHOS, BILITOT, PROT, ALBUMIN in the last 168 hours. No results for input(s): LIPASE, AMYLASE in the last 168 hours. No results for input(s): AMMONIA in the last 168 hours. CBC: No results for input(s): WBC, NEUTROABS, HGB, HCT, MCV, PLT in the last 168 hours. Cardiac Enzymes: No results for input(s): CKTOTAL, CKMB, CKMBINDEX, TROPONINI in the last 168 hours. BNP: Invalid input(s):  POCBNP CBG: Recent Labs  Lab 04/04/20 1138  GLUCAP 242*   D-Dimer No results for input(s): DDIMER in the last 72 hours. Hgb A1c No results for input(s): HGBA1C in the last 72 hours. Lipid Profile No results for input(s): CHOL, HDL, LDLCALC, TRIG, CHOLHDL, LDLDIRECT in the last 72 hours. Thyroid function studies No results for input(s): TSH, T4TOTAL, T3FREE, THYROIDAB in the last 72 hours.  Invalid input(s): FREET3 Anemia work up No results for input(s): VITAMINB12, FOLATE, FERRITIN, TIBC, IRON, RETICCTPCT in the last 72 hours. Urinalysis    Component Value Date/Time   COLORURINE STRAW (A) 12/12/2018 1927   APPEARANCEUR CLEAR 12/12/2018 1927   LABSPEC 1.011 12/12/2018 1927   PHURINE 5.0 12/12/2018 1927   GLUCOSEU NEGATIVE 12/12/2018 1927   HGBUR NEGATIVE 12/12/2018 1927   BILIRUBINUR NEGATIVE 12/12/2018 [place] NEGATIVE 12/12/2018 1927   PROTEINUR NEGATIVE 12/12/2018 1927   UROBILINOGEN 0.2 08/06/2013 1436   NITRITE NEGATIVE 12/12/2018 1927   LEUKOCYTESUR NEGATIVE 12/12/2018 1927   Sepsis Labs Invalid input(s): PROCALCITONIN,  WBC,  LACTICIDVEN Microbiology Recent Results (from the past 240 hour(s))  SARS Coronavirus 2 by RT PCR (hospital order, performed in [place] hospital lab) Nasopharyngeal Nasopharyngeal Swab     Status: None   Collection Time: 04/09/20 10:08 AM   Specimen: Nasopharyngeal Swab  Result Value Ref Range Status   SARS Coronavirus 2 NEGATIVE NEGATIVE Final    Comment: (NOTE) SARS-CoV-2 target nucleic acids are NOT DETECTED.  The SARS-CoV-2 RNA is generally detectable in upper and lower respiratory specimens during the acute phase of infection. The lowest concentration of SARS-CoV-2 viral copies this assay can detect is 250 copies / mL. A negative result does not preclude SARS-CoV-2 infection and should not be used as the sole basis for treatment or other patient management decisions.  A negative result may occur with improper specimen  collection / handling, submission of specimen other than nasopharyngeal swab, presence of viral mutation(s) within the areas targeted by this assay, and inadequate number of viral copies (<250 copies / mL). A negative result must be combined with clinical observations, patient history, and epidemiological information.  Fact Sheet for Patients:   StrictlyIdeas.no  Fact Sheet for Healthcare Providers: BankingDealers.co.za  This test is not yet approved or  cleared by the [place] FDA and has been authorized for detection and/or diagnosis of SARS-CoV-2 by FDA under an Emergency Use Authorization (EUA).  This EUA will remain in effect (meaning this test can be used) for the duration of the COVID-19 declaration under Section 564(b)(1) of the Act, 21 U.S.C. section 360bbb-3(b)(1), unless the authorization is terminated or revoked sooner.  Performed at [facility], [place] [address] [place], [place] [address]      Time coordinating discharge: 35 minutes  SIGNED:   Cordelia Poche, MD [facility] 04/10/2020, 9:28 AM

## 2020-04-10 DIAGNOSIS — Z515 Encounter for palliative care: Secondary | ICD-10-CM

## 2020-04-10 MED ORDER — HEPARIN SOD (PORK) LOCK FLUSH 100 UNIT/ML IV SOLN
500.0000 [IU] | INTRAVENOUS | Status: AC | PRN
  Administered 2020-04-10: 500 [IU]
  Filled 2020-04-10: qty 5

## 2020-04-10 MED ORDER — LEVALBUTEROL HCL 0.63 MG/3ML IN NEBU: 0.6300 mg | INHALATION_SOLUTION | Freq: Four times a day (QID) | RESPIRATORY_TRACT | 2 refills | Status: DC | PRN

## 2020-04-10 MED ORDER — METOPROLOL SUCCINATE ER 25 MG PO TB24: 12.5000 mg | ORAL_TABLET | Freq: Every day | ORAL | Status: DC

## 2020-04-10 NOTE — Progress Notes (Signed)
Called to give report no answer at [facility].

## 2020-04-10 NOTE — Plan of Care (Signed)
  Problem: Activity: Goal: Risk for activity intolerance will decrease Outcome: Adequate for Discharge   Problem: Pain Managment: Goal: General experience of comfort will improve Outcome: Adequate for Discharge   Problem: Safety: Goal: Ability to remain free from injury will improve Outcome: Adequate for Discharge   Problem: Education: Goal: Ability to demonstrate management of disease process will improve Outcome: Adequate for Discharge Goal: Ability to verbalize understanding of medication therapies will improve Outcome: Adequate for Discharge Goal: Individualized Educational Video(s) Outcome: Adequate for Discharge   Problem: Activity: Goal: Capacity to carry out activities will improve Outcome: Adequate for Discharge   Problem: Cardiac: Goal: Ability to achieve and maintain adequate cardiopulmonary perfusion will improve Outcome: Adequate for Discharge

## 2020-04-10 NOTE — Progress Notes (Signed)
[name]  Report at [facility]. Patient is in transport to SNF

## 2020-04-10 NOTE — Progress Notes (Signed)
D/C instructions printed and placed in packets at nurse's station along with DNR.

## 2020-04-10 NOTE — TOC Transition Note (Signed)
Transition of Care [facility]) - CM/SW Discharge Note   Patient Details  Name: [name] MRN: [id number] Date of Birth: Apr 20, 1961  Transition of Care [facility] CM/SW Contact:  [address]ta [address], [place] Phone Number: 04/10/2020, 10:33 AM   Clinical Narrative:    [facility] will DC to: [facility] Anticipated DC date: 04/10/20 Family notified: [name] Transport by: Corey Harold   Per MD patient ready for DC to [facility]. RN to call report prior to discharge [phone]. RN, patient, patient's family, and facility notified of DC. Discharge Summary and FL2 sent to facility. DC packet on chart. Ambulance transport requested for patient.   CSW will sign off for now as social work intervention is no longer needed. Please consult [place] again if new needs arise.     Final next level of care: Home w Home Health Services Barriers to Discharge:  (does not have his key to his oxygen, friend has the key)   Patient Goals and CMS Choice   CMS Medicare.gov Compare Post Acute Care list provided to:: Patient Choice offered to / list presented to : Patient  Discharge Placement                       Discharge Plan and Services   Discharge Planning Services: CM Consult Post Acute Care Choice: Home Health, Durable Medical Equipment          DME Arranged: Walker rolling with seat DME Agency: [employer] Date DME Agency Contacted: 03/28/20 Time DME Agency Contacted: [phone] Representative spoke with at DME Agency: Jodell Cipro HH Arranged: RN, PT, OT [facility] Agency: [facility] Date [place]: 03/28/20 Time [place]: 1608 Representative spoke with at [place]: [place] (SDOH) Interventions Food Insecurity Interventions: Intervention Not Indicated Financial Strain Interventions: Intervention Not Indicated Transportation Interventions: [facility]   Readmission Risk Interventions Readmission Risk Prevention Plan 03/28/2020   Transportation Screening Complete  HRI or Home Care Consult Complete  Social Work Consult for [place] Planning/Counseling Complete  Palliative Care Screening Not Applicable  Medication Review Press photographer) Complete  Some recent data might be hidden

## 2020-04-13 ENCOUNTER — Encounter (HOSPITAL_COMMUNITY): Payer: Self-pay | Admitting: Emergency Medicine

## 2020-04-13 ENCOUNTER — Inpatient Hospital Stay (HOSPITAL_COMMUNITY)
Admission: EM | Admit: 2020-04-13 | Discharge: 2020-04-20 | DRG: 871 | Disposition: A | Discharge status: Hospice | Payer: Medicare HMO | Attending: Family Medicine | Admitting: Family Medicine

## 2020-04-13 ENCOUNTER — Emergency Department (HOSPITAL_COMMUNITY): Payer: Medicare HMO

## 2020-04-13 ENCOUNTER — Other Ambulatory Visit: Payer: Self-pay

## 2020-04-13 DIAGNOSIS — Z7189 Other specified counseling: Secondary | ICD-10-CM

## 2020-04-13 DIAGNOSIS — Z87891 Personal history of nicotine dependence: Secondary | ICD-10-CM

## 2020-04-13 DIAGNOSIS — C3490 Malignant neoplasm of unspecified part of unspecified bronchus or lung: Secondary | ICD-10-CM | POA: Diagnosis not present

## 2020-04-13 DIAGNOSIS — R0603 Acute respiratory distress: Secondary | ICD-10-CM

## 2020-04-13 DIAGNOSIS — C61 Malignant neoplasm of prostate: Secondary | ICD-10-CM | POA: Diagnosis present

## 2020-04-13 DIAGNOSIS — J9620 Acute and chronic respiratory failure, unspecified whether with hypoxia or hypercapnia: Secondary | ICD-10-CM | POA: Diagnosis present

## 2020-04-13 DIAGNOSIS — Z79899 Other long term (current) drug therapy: Secondary | ICD-10-CM

## 2020-04-13 DIAGNOSIS — E46 Unspecified protein-calorie malnutrition: Secondary | ICD-10-CM | POA: Diagnosis present

## 2020-04-13 DIAGNOSIS — N179 Acute kidney failure, unspecified: Secondary | ICD-10-CM | POA: Diagnosis not present

## 2020-04-13 DIAGNOSIS — Z681 Body mass index (BMI) 19 or less, adult: Secondary | ICD-10-CM

## 2020-04-13 DIAGNOSIS — Z66 Do not resuscitate: Secondary | ICD-10-CM | POA: Diagnosis present

## 2020-04-13 DIAGNOSIS — J181 Lobar pneumonia, unspecified organism: Secondary | ICD-10-CM | POA: Diagnosis present

## 2020-04-13 DIAGNOSIS — R7989 Other specified abnormal findings of blood chemistry: Secondary | ICD-10-CM | POA: Diagnosis present

## 2020-04-13 DIAGNOSIS — J700 Acute pulmonary manifestations due to radiation: Secondary | ICD-10-CM | POA: Diagnosis present

## 2020-04-13 DIAGNOSIS — Z923 Personal history of irradiation: Secondary | ICD-10-CM

## 2020-04-13 DIAGNOSIS — I5043 Acute on chronic combined systolic (congestive) and diastolic (congestive) heart failure: Secondary | ICD-10-CM | POA: Diagnosis present

## 2020-04-13 DIAGNOSIS — I471 Supraventricular tachycardia: Secondary | ICD-10-CM | POA: Diagnosis not present

## 2020-04-13 DIAGNOSIS — J441 Chronic obstructive pulmonary disease with (acute) exacerbation: Secondary | ICD-10-CM | POA: Diagnosis present

## 2020-04-13 DIAGNOSIS — I252 Old myocardial infarction: Secondary | ICD-10-CM

## 2020-04-13 DIAGNOSIS — E785 Hyperlipidemia, unspecified: Secondary | ICD-10-CM | POA: Diagnosis present

## 2020-04-13 DIAGNOSIS — I255 Ischemic cardiomyopathy: Secondary | ICD-10-CM | POA: Diagnosis present

## 2020-04-13 DIAGNOSIS — I5084 End stage heart failure: Secondary | ICD-10-CM | POA: Diagnosis present

## 2020-04-13 DIAGNOSIS — Z9981 Dependence on supplemental oxygen: Secondary | ICD-10-CM

## 2020-04-13 DIAGNOSIS — R6510 Systemic inflammatory response syndrome (SIRS) of non-infectious origin without acute organ dysfunction: Secondary | ICD-10-CM | POA: Diagnosis present

## 2020-04-13 DIAGNOSIS — Z95810 Presence of automatic (implantable) cardiac defibrillator: Secondary | ICD-10-CM

## 2020-04-13 DIAGNOSIS — A419 Sepsis, unspecified organism: Secondary | ICD-10-CM | POA: Diagnosis not present

## 2020-04-13 DIAGNOSIS — Z955 Presence of coronary angioplasty implant and graft: Secondary | ICD-10-CM

## 2020-04-13 DIAGNOSIS — J9621 Acute and chronic respiratory failure with hypoxia: Secondary | ICD-10-CM | POA: Diagnosis present

## 2020-04-13 DIAGNOSIS — Z809 Family history of malignant neoplasm, unspecified: Secondary | ICD-10-CM

## 2020-04-13 DIAGNOSIS — D638 Anemia in other chronic diseases classified elsewhere: Secondary | ICD-10-CM | POA: Diagnosis present

## 2020-04-13 DIAGNOSIS — Y842 Radiological procedure and radiotherapy as the cause of abnormal reaction of the patient, or of later complication, without mention of misadventure at the time of the procedure: Secondary | ICD-10-CM | POA: Diagnosis present

## 2020-04-13 DIAGNOSIS — I2510 Atherosclerotic heart disease of native coronary artery without angina pectoris: Secondary | ICD-10-CM | POA: Diagnosis present

## 2020-04-13 DIAGNOSIS — Z515 Encounter for palliative care: Secondary | ICD-10-CM

## 2020-04-13 DIAGNOSIS — Z951 Presence of aortocoronary bypass graft: Secondary | ICD-10-CM

## 2020-04-13 DIAGNOSIS — J440 Chronic obstructive pulmonary disease with acute lower respiratory infection: Secondary | ICD-10-CM | POA: Diagnosis present

## 2020-04-13 DIAGNOSIS — B449 Aspergillosis, unspecified: Secondary | ICD-10-CM | POA: Diagnosis present

## 2020-04-13 DIAGNOSIS — I700 Atherosclerosis of aorta: Secondary | ICD-10-CM | POA: Diagnosis present

## 2020-04-13 DIAGNOSIS — Z7951 Long term (current) use of inhaled steroids: Secondary | ICD-10-CM

## 2020-04-13 DIAGNOSIS — Z20822 Contact with and (suspected) exposure to covid-19: Secondary | ICD-10-CM | POA: Diagnosis present

## 2020-04-13 DIAGNOSIS — E871 Hypo-osmolality and hyponatremia: Secondary | ICD-10-CM | POA: Diagnosis present

## 2020-04-13 DIAGNOSIS — C3491 Malignant neoplasm of unspecified part of right bronchus or lung: Secondary | ICD-10-CM | POA: Diagnosis present

## 2020-04-13 DIAGNOSIS — T502X5A Adverse effect of carbonic-anhydrase inhibitors, benzothiadiazides and other diuretics, initial encounter: Secondary | ICD-10-CM | POA: Diagnosis not present

## 2020-04-13 LAB — COMPREHENSIVE METABOLIC PANEL
ALT: 12 U/L (ref 0–44)
AST: 11 U/L — ABNORMAL LOW (ref 15–41)
Albumin: 2.6 g/dL — ABNORMAL LOW (ref 3.5–5.0)
Alkaline Phosphatase: 69 U/L (ref 38–126)
Anion gap: 9 (ref 5–15)
BUN: 30 mg/dL — ABNORMAL HIGH (ref 6–20)
CO2: 36 mmol/L — ABNORMAL HIGH (ref 22–32)
Calcium: 8.8 mg/dL — ABNORMAL LOW (ref 8.9–10.3)
Chloride: 85 mmol/L — ABNORMAL LOW (ref 98–111)
Creatinine, Ser: 0.97 mg/dL (ref 0.61–1.24)
GFR, Estimated: >60 mL/min (ref >60)
Glucose, Bld: 131 mg/dL — ABNORMAL HIGH (ref 70–99)
Potassium: 4.1 mmol/L (ref 3.5–5.1)
Sodium: 130 mmol/L — ABNORMAL LOW (ref 135–145)
Total Bilirubin: 0.4 mg/dL (ref 0.3–1.2)
Total Protein: 7.3 g/dL (ref 6.5–8.1)

## 2020-04-13 LAB — CBC WITH DIFFERENTIAL/PLATELET
Abs Immature Granulocytes: 0.16 10*3/uL — ABNORMAL HIGH (ref 0.00–0.07)
Basophils Absolute: 0 10*3/uL (ref 0.0–0.1)
Basophils Relative: 0 %
Eosinophils Absolute: 0.1 10*3/uL (ref 0.0–0.5)
Eosinophils Relative: 1 %
HCT: 23.4 % — ABNORMAL LOW (ref 39.0–52.0)
Hemoglobin: 7.4 g/dL — ABNORMAL LOW (ref 13.0–17.0)
Immature Granulocytes: 2 %
Lymphocytes Relative: 13 %
Lymphs Abs: 1.4 10*3/uL (ref 0.7–4.0)
MCH: 29.5 pg (ref 26.0–34.0)
MCHC: 31.6 g/dL (ref 30.0–36.0)
MCV: 93.2 fL (ref 80.0–100.0)
Monocytes Absolute: 1 10*3/uL (ref 0.1–1.0)
Monocytes Relative: 9 %
Neutro Abs: 8.1 10*3/uL — ABNORMAL HIGH (ref 1.7–7.7)
Neutrophils Relative %: 75 %
Platelets: 361 10*3/uL (ref 150–400)
RBC: 2.51 MIL/uL — ABNORMAL LOW (ref 4.22–5.81)
RDW: 17.2 % — ABNORMAL HIGH (ref 11.5–15.5)
WBC: 10.8 10*3/uL — ABNORMAL HIGH (ref 4.0–10.5)
nRBC: 0.5 % — ABNORMAL HIGH (ref 0.0–0.2)

## 2020-04-13 LAB — BRAIN NATRIURETIC PEPTIDE: B Natriuretic Peptide: 2052 pg/mL — ABNORMAL HIGH (ref 0.0–100.0)

## 2020-04-13 LAB — RESP PANEL BY RT PCR (RSV, FLU A&B, COVID)
Influenza A by PCR: NEGATIVE
Influenza B by PCR: NEGATIVE
Respiratory Syncytial Virus by PCR: NEGATIVE
SARS Coronavirus 2 by RT PCR: NEGATIVE
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
## 2020-04-13 NOTE — ED Triage Notes (Signed)
Pt from [place] home with decreased O2 sats on 5L. Pt's sats at [facility] home were 56% on 5L. EMS had his sats at 87% on 5L upon arrival. Pt just admitted to [facility] from [facility] on Friday as a DNR and was to start Hospice (has lung CA) but that paperwork hasn't gone trough according to [facility] staff. Pt had been requesting Cpap at [facility] but they didn't have orders for that or an in-house respiratory therapist. Pt alert and oriented and in no obvious distress.

## 2020-04-14 ENCOUNTER — Encounter (HOSPITAL_COMMUNITY): Payer: Self-pay | Admitting: Family Medicine

## 2020-04-14 DIAGNOSIS — J441 Chronic obstructive pulmonary disease with (acute) exacerbation: Secondary | ICD-10-CM | POA: Diagnosis present

## 2020-04-14 DIAGNOSIS — Z955 Presence of coronary angioplasty implant and graft: Secondary | ICD-10-CM | POA: Diagnosis not present

## 2020-04-14 DIAGNOSIS — Z515 Encounter for palliative care: Secondary | ICD-10-CM | POA: Diagnosis not present

## 2020-04-14 DIAGNOSIS — Z79899 Other long term (current) drug therapy: Secondary | ICD-10-CM | POA: Diagnosis not present

## 2020-04-14 DIAGNOSIS — J700 Acute pulmonary manifestations due to radiation: Secondary | ICD-10-CM | POA: Diagnosis present

## 2020-04-14 DIAGNOSIS — J9621 Acute and chronic respiratory failure with hypoxia: Secondary | ICD-10-CM | POA: Diagnosis present

## 2020-04-14 DIAGNOSIS — Z951 Presence of aortocoronary bypass graft: Secondary | ICD-10-CM | POA: Diagnosis not present

## 2020-04-14 DIAGNOSIS — E46 Unspecified protein-calorie malnutrition: Secondary | ICD-10-CM | POA: Diagnosis present

## 2020-04-14 DIAGNOSIS — Z7189 Other specified counseling: Secondary | ICD-10-CM | POA: Diagnosis not present

## 2020-04-14 DIAGNOSIS — J181 Lobar pneumonia, unspecified organism: Secondary | ICD-10-CM | POA: Diagnosis present

## 2020-04-14 DIAGNOSIS — Z681 Body mass index (BMI) 19 or less, adult: Secondary | ICD-10-CM | POA: Diagnosis not present

## 2020-04-14 DIAGNOSIS — I5043 Acute on chronic combined systolic (congestive) and diastolic (congestive) heart failure: Secondary | ICD-10-CM | POA: Diagnosis present

## 2020-04-14 DIAGNOSIS — N179 Acute kidney failure, unspecified: Secondary | ICD-10-CM | POA: Diagnosis not present

## 2020-04-14 DIAGNOSIS — E871 Hypo-osmolality and hyponatremia: Secondary | ICD-10-CM | POA: Diagnosis present

## 2020-04-14 DIAGNOSIS — C3400 Malignant neoplasm of unspecified main bronchus: Secondary | ICD-10-CM | POA: Diagnosis not present

## 2020-04-14 DIAGNOSIS — Y842 Radiological procedure and radiotherapy as the cause of abnormal reaction of the patient, or of later complication, without mention of misadventure at the time of the procedure: Secondary | ICD-10-CM | POA: Diagnosis present

## 2020-04-14 DIAGNOSIS — Z87891 Personal history of nicotine dependence: Secondary | ICD-10-CM | POA: Diagnosis not present

## 2020-04-14 DIAGNOSIS — Z66 Do not resuscitate: Secondary | ICD-10-CM | POA: Diagnosis present

## 2020-04-14 DIAGNOSIS — R6510 Systemic inflammatory response syndrome (SIRS) of non-infectious origin without acute organ dysfunction: Secondary | ICD-10-CM | POA: Diagnosis present

## 2020-04-14 DIAGNOSIS — A419 Sepsis, unspecified organism: Secondary | ICD-10-CM | POA: Diagnosis present

## 2020-04-14 DIAGNOSIS — Z923 Personal history of irradiation: Secondary | ICD-10-CM | POA: Diagnosis not present

## 2020-04-14 DIAGNOSIS — I471 Supraventricular tachycardia: Secondary | ICD-10-CM | POA: Diagnosis not present

## 2020-04-14 DIAGNOSIS — J440 Chronic obstructive pulmonary disease with acute lower respiratory infection: Secondary | ICD-10-CM | POA: Diagnosis present

## 2020-04-14 DIAGNOSIS — Z809 Family history of malignant neoplasm, unspecified: Secondary | ICD-10-CM | POA: Diagnosis not present

## 2020-04-14 DIAGNOSIS — Z95810 Presence of automatic (implantable) cardiac defibrillator: Secondary | ICD-10-CM | POA: Diagnosis not present

## 2020-04-14 DIAGNOSIS — B449 Aspergillosis, unspecified: Secondary | ICD-10-CM | POA: Diagnosis present

## 2020-04-14 DIAGNOSIS — C3491 Malignant neoplasm of unspecified part of right bronchus or lung: Secondary | ICD-10-CM | POA: Diagnosis present

## 2020-04-14 DIAGNOSIS — Z20822 Contact with and (suspected) exposure to covid-19: Secondary | ICD-10-CM | POA: Diagnosis present

## 2020-04-14 DIAGNOSIS — R7989 Other specified abnormal findings of blood chemistry: Secondary | ICD-10-CM | POA: Diagnosis present

## 2020-04-14 DIAGNOSIS — E43 Unspecified severe protein-calorie malnutrition: Secondary | ICD-10-CM | POA: Diagnosis not present

## 2020-04-14 DIAGNOSIS — C3490 Malignant neoplasm of unspecified part of unspecified bronchus or lung: Secondary | ICD-10-CM | POA: Diagnosis present

## 2020-04-14 LAB — TROPONIN I (HIGH SENSITIVITY): Troponin I (High Sensitivity): 62 ng/L — ABNORMAL HIGH (ref <18)

## 2020-04-14 LAB — LACTIC ACID, PLASMA: Lactic Acid, Venous: 1.2 mmol/L (ref 0.5–1.9)

## 2020-04-14 LAB — URINALYSIS, ROUTINE W REFLEX MICROSCOPIC
Bilirubin Urine: NEGATIVE
Glucose, UA: NEGATIVE mg/dL
Hgb urine dipstick: NEGATIVE
Ketones, ur: NEGATIVE mg/dL
Leukocytes,Ua: NEGATIVE
Nitrite: NEGATIVE
Protein, ur: NEGATIVE mg/dL
Specific Gravity, Urine: 1.013 (ref 1.005–1.030)
pH: 5 (ref 5.0–8.0)

## 2020-04-14 LAB — PROCALCITONIN: Procalcitonin: 16.26 ng/mL

## 2020-04-14 MED ORDER — MORPHINE SULFATE (CONCENTRATE) 10 MG/0.5ML PO SOLN
10.0000 mg | ORAL | Status: DC | PRN
  Administered 2020-04-15 – 2020-04-16 (×5): 10 mg via ORAL
  Filled 2020-04-14 (×5): qty 0.5

## 2020-04-14 MED ORDER — FOLIC ACID 1 MG PO TABS
1.0000 mg | ORAL_TABLET | Freq: Every day | ORAL | Status: DC
  Administered 2020-04-14 – 2020-04-20 (×7): 1 mg via ORAL
  Filled 2020-04-14 (×7): qty 1

## 2020-04-14 MED ORDER — PROCHLORPERAZINE MALEATE 5 MG PO TABS: 10.0000 mg | ORAL_TABLET | Freq: Four times a day (QID) | ORAL | Status: DC | PRN

## 2020-04-14 MED ORDER — FUROSEMIDE 80 MG PO TABS
80.0000 mg | ORAL_TABLET | Freq: Every day | ORAL | Status: DC
  Administered 2020-04-14 – 2020-04-15 (×2): 80 mg via ORAL
  Filled 2020-04-14: qty 1
  Filled 2020-04-14: qty 2

## 2020-04-14 MED ORDER — UMECLIDINIUM BROMIDE 62.5 MCG/INH IN AEPB
1.0000 | INHALATION_SPRAY | Freq: Every day | RESPIRATORY_TRACT | Status: DC
  Administered 2020-04-14 – 2020-04-15 (×2): 1 via RESPIRATORY_TRACT
  Filled 2020-04-14 (×2): qty 7

## 2020-04-14 MED ORDER — ALPRAZOLAM 1 MG PO TABS
1.0000 mg | ORAL_TABLET | Freq: Three times a day (TID) | ORAL | Status: DC | PRN
  Administered 2020-04-15 – 2020-04-19 (×7): 1 mg via ORAL
  Filled 2020-04-14 (×8): qty 1

## 2020-04-14 MED ORDER — ENSURE ENLIVE PO LIQD
237.0000 mL | Freq: Two times a day (BID) | ORAL | Status: DC
  Administered 2020-04-15 – 2020-04-20 (×5): 237 mL via ORAL
  Filled 2020-04-14 (×4): qty 237

## 2020-04-14 MED ORDER — POLYETHYLENE GLYCOL 3350 17 G PO PACK: 17.0000 g | PACK | Freq: Every day | ORAL | Status: DC | PRN

## 2020-04-14 MED ORDER — ZOLPIDEM TARTRATE 5 MG PO TABS
5.0000 mg | ORAL_TABLET | Freq: Every evening | ORAL | Status: DC | PRN
  Administered 2020-04-14 – 2020-04-20 (×3): 5 mg via ORAL
  Filled 2020-04-14 (×3): qty 1

## 2020-04-14 MED ORDER — METOPROLOL SUCCINATE ER 25 MG PO TB24
25.0000 mg | ORAL_TABLET | Freq: Every day | ORAL | Status: DC
  Administered 2020-04-14 – 2020-04-20 (×7): 25 mg via ORAL
  Filled 2020-04-14 (×7): qty 1

## 2020-04-14 MED ORDER — FENTANYL CITRATE (PF) 100 MCG/2ML IJ SOLN
12.5000 ug | INTRAMUSCULAR | Status: DC | PRN
  Administered 2020-04-14: 50 ug via INTRAVENOUS
  Filled 2020-04-14: qty 2

## 2020-04-14 MED ORDER — ACETAMINOPHEN 325 MG PO TABS
650.0000 mg | ORAL_TABLET | Freq: Four times a day (QID) | ORAL | Status: DC | PRN
  Administered 2020-04-14 – 2020-04-16 (×3): 650 mg via ORAL
  Filled 2020-04-14 (×3): qty 2

## 2020-04-14 MED ORDER — HEPARIN SODIUM (PORCINE) 5000 UNIT/ML IJ SOLN
5000.0000 [IU] | Freq: Three times a day (TID) | INTRAMUSCULAR | Status: DC
  Administered 2020-04-14 – 2020-04-20 (×16): 5000 [IU] via SUBCUTANEOUS
  Filled 2020-04-14 (×17): qty 1

## 2020-04-14 MED ORDER — MORPHINE SULFATE (CONCENTRATE) 10 MG/0.5ML PO SOLN: 10.0000 mg | ORAL | Status: DC | PRN

## 2020-04-14 MED ORDER — TIOTROPIUM BROMIDE MONOHYDRATE 18 MCG IN CAPS
18.0000 ug | ORAL_CAPSULE | Freq: Every day | RESPIRATORY_TRACT | Status: DC
  Filled 2020-04-14: qty 5

## 2020-04-14 MED ORDER — IPRATROPIUM BROMIDE 0.02 % IN SOLN: 3.0000 mL | Freq: Four times a day (QID) | RESPIRATORY_TRACT | Status: DC | PRN

## 2020-04-14 MED ORDER — ACETAMINOPHEN 650 MG RE SUPP: 650.0000 mg | Freq: Four times a day (QID) | RECTAL | Status: DC | PRN

## 2020-04-14 MED ORDER — ONDANSETRON HCL 4 MG PO TABS: 4.0000 mg | ORAL_TABLET | Freq: Four times a day (QID) | ORAL | Status: DC | PRN

## 2020-04-14 MED ORDER — LEVALBUTEROL HCL 0.63 MG/3ML IN NEBU: 0.6300 mg | INHALATION_SOLUTION | Freq: Four times a day (QID) | RESPIRATORY_TRACT | Status: DC | PRN

## 2020-04-14 MED ORDER — ONDANSETRON HCL 4 MG/2ML IJ SOLN: 4.0000 mg | Freq: Four times a day (QID) | INTRAMUSCULAR | Status: DC | PRN

## 2020-04-14 MED ORDER — MOMETASONE FURO-FORMOTEROL FUM 200-5 MCG/ACT IN AERO: 2.0000 | INHALATION_SPRAY | Freq: Two times a day (BID) | RESPIRATORY_TRACT | Status: DC

## 2020-04-14 NOTE — Hospital Course (Signed)
47yom PMH lung cancer, prostate cancer, COPD, chornic hypoxic respiratory failure 4L, ischemic cardiomyopathy presented to ED w/ SOB. Admitted for acute/chronic resp failure 4 > 6L, progression of lung cancer, SIRS, acute/chronic anemia, palliative care consultation and end of life care.  Hospitalized 10/20-11/12 at [facility] for acute/chornic systolic/diastolic CHF tx'd w/ diruesis. AICD was deactivated 11/6; acute/chronic resp failure, hypoerkalemia discharged to facility with intent for hospice. Per Dr. Delanna Ahmadi note 11/18 "The current discharge plan is to find him a LTC bed closer to his family in [place] or [place] where he can also get hospice services who can oversee and help with his medication management."
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
## 2020-04-14 NOTE — Progress Notes (Addendum)
PROGRESS NOTE  [name] ZES:[id number] DOB: December 16, 1960 DOA: 04/13/2020 PCP: [name], MD  Brief History   19yom PMH lung cancer, prostate cancer, COPD, chornic hypoxic respiratory failure 4L, ischemic cardiomyopathy presented to ED w/ SOB. Admitted for acute/chronic resp failure 4 > 6L, progression of lung cancer, SIRS, acute/chronic anemia, palliative care consultation and end of life care.  Hospitalized 10/20-11/12 at [facility] for acute/chornic systolic/diastolic CHF tx'd w/ diruesis. AICD was deactivated 11/6; acute/chronic resp failure, hypoerkalemia discharged to facility with intent for hospice. Per Dr. Delanna Ahmadi note 11/18 "The current discharge plan is to find him a LTC bed closer to his family in [place] or [place] where he can also get hospice services who can oversee and help with his medication management."   A & P  Adenocarcinoma right lung, prostate cancer dx 2019 --last seen by oncology 10/18 at which time cessation of further treatment was being considered. --no further tx per patient  Acute/chronic respiratory failure --secondary to lung cancer complicated by COPD/emphysema, fibrotic left lung and chronic hypoxic respiratory failure. 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 --continue supplemental oxygen, stable on 6L [place]  COPD, panlobular emphysema, Chronic hypoxic resp failure on 4L --plan as above  SIRS --no s/s of sepsis or acute infection, procalcitonin high in patient w/ cancer --no further eval > patient does not want abx or intervention  Hyponatremia --secondary lung disease, possibly low solute intake --supportive care  Anemia of chronic disease --no bleeding reported. Supportive care based on GOC  Chronic systolic/diastolic CHF --appears compensated. LVEF <20% 10/21  Aortic atherosclerosis  End of life care --pt clear in [place]: wants comfort, no abx, no interventions.  Wants to be supported at end of life, no more  Disposition Plan:  Discussion: will consult PMT and TOC to assist w/ transfer to facility that can provide for hospice needs, may be hospice residential appropriate  Dispo: The patient is from: [facility]               Anticipated d/c is to: TBD              Anticipated d/c date is: 1 day              Patient currently is medically stable to d/c.  DVT prophylaxis: heparin injection 5,000 Units Start: 04/14/20 0130 SCDs Start: 04/14/20 0123   Code Status: DNR Family Communication: none  Murray Hodgkins, MD  [facility] Direct contact: see [email or web address] (further directions at bottom of note if needed) 7PM-7AM contact night coverage as at bottom of note 04/14/2020, 8:21 AM  LOS: 0 days    Consults:  . PMT   Procedures:  . None   Interval History/Subjective  CC: f/u SOB  Breathing better, mild pain on right side of chest  "I don't want anything done except to be left alone and to die in peace. Don't do anything that won't help me."  Objective   Vitals:  Vitals:   04/14/20 0745 04/14/20 0800  BP:  99/75  Pulse: (!) 103 (!) 102  Resp: 19 (!) 29  Temp:    SpO2: (!) 88% (!) 89%    Exam:  Constitutional:   . Appears calm and comfortable in ED ENMT:  . grossly normal hearing  Respiratory:  . CTA bilaterally, no w/r/r.  . Respiratory effort mildly increased . On 6L Cardiovascular:  . RRR, no m/r/g . No LE extremity edema   Psychiatric:  . Mental  status o Mood, affect appropriate o Oriented to person, hospital, month, year . judgment and insight appear intact. Aware of current diagnoses  I have personally reviewed the following:   Today's Data  . Troponin 62, flat compared to 10/20. EKG SR, LVH w/ repol abnormailty . Lactic acid 1.2 . Procalcitonin 16  Scheduled Meds: . feeding supplement  237 mL Oral BID BM  . folic acid  1 mg Oral Daily  . furosemide  80 mg Oral Daily  . heparin  5,000 Units Subcutaneous  Q8H  . metoprolol succinate  25 mg Oral Daily  . mometasone-formoterol  2 puff Inhalation BID  . umeclidinium bromide  1 puff Inhalation Daily   Continuous Infusions:  Principal Problem:   Acute on chronic respiratory failure (HCC) Active Problems:   End of life care   Protein calorie malnutrition (HCC)   SIRS (systemic inflammatory response syndrome) (HCC)   Elevated brain natriuretic peptide (BNP) level   LOS: 0 days   How to contact the [facility] Attending or Consulting provider [place] or covering provider during after hours [place], for this patient?  1. Check the care team in [facility] and look for a) attending/consulting [facility] provider listed and b) the [facility] team listed 2. Log into [email or web address] and use [place]'s universal password to access. If you do not have the password, please contact the hospital operator. 3. Locate the [facility] provider you are looking for under [facility] and page to a number that you can be directly reached. 4. If you still have difficulty reaching the provider, please page the [facility] (Director on Call) for the Hospitalists listed on amion for assistance.   Prolonged services, Examined and interviewed patient, updated orders Plan for supportive care and transfer to facility that can provide for needs Time [phone]
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
## 2020-04-15 DIAGNOSIS — Z515 Encounter for palliative care: Secondary | ICD-10-CM | POA: Diagnosis not present

## 2020-04-15 DIAGNOSIS — J9621 Acute and chronic respiratory failure with hypoxia: Secondary | ICD-10-CM | POA: Diagnosis not present

## 2020-04-15 DIAGNOSIS — C3400 Malignant neoplasm of unspecified main bronchus: Secondary | ICD-10-CM | POA: Diagnosis not present

## 2020-04-15 DIAGNOSIS — I5043 Acute on chronic combined systolic (congestive) and diastolic (congestive) heart failure: Secondary | ICD-10-CM | POA: Diagnosis not present

## 2020-04-15 DIAGNOSIS — J441 Chronic obstructive pulmonary disease with (acute) exacerbation: Secondary | ICD-10-CM | POA: Diagnosis not present

## 2020-04-15 DIAGNOSIS — Z7189 Other specified counseling: Secondary | ICD-10-CM | POA: Diagnosis not present

## 2020-04-15 DIAGNOSIS — R6510 Systemic inflammatory response syndrome (SIRS) of non-infectious origin without acute organ dysfunction: Secondary | ICD-10-CM

## 2020-04-15 MED ORDER — VANCOMYCIN HCL 1500 MG/300ML IV SOLN
1500.0000 mg | Freq: Once | INTRAVENOUS | Status: AC
  Administered 2020-04-15: 1500 mg via INTRAVENOUS
  Filled 2020-04-15: qty 300

## 2020-04-15 MED ORDER — BUDESONIDE 0.5 MG/2ML IN SUSP
0.5000 mg | Freq: Two times a day (BID) | RESPIRATORY_TRACT | Status: DC
  Administered 2020-04-15 – 2020-04-20 (×10): 0.5 mg via RESPIRATORY_TRACT
  Filled 2020-04-15 (×10): qty 2

## 2020-04-15 MED ORDER — IPRATROPIUM-ALBUTEROL 0.5-2.5 (3) MG/3ML IN SOLN
3.0000 mL | Freq: Four times a day (QID) | RESPIRATORY_TRACT | Status: DC
  Administered 2020-04-15 – 2020-04-20 (×18): 3 mL via RESPIRATORY_TRACT
  Filled 2020-04-15 (×18): qty 3

## 2020-04-15 MED ORDER — METHYLPREDNISOLONE SODIUM SUCC 125 MG IJ SOLR
60.0000 mg | Freq: Two times a day (BID) | INTRAMUSCULAR | Status: DC
  Administered 2020-04-15 – 2020-04-20 (×11): 60 mg via INTRAVENOUS
  Filled 2020-04-15 (×11): qty 2

## 2020-04-15 MED ORDER — FUROSEMIDE 10 MG/ML IJ SOLN
80.0000 mg | Freq: Two times a day (BID) | INTRAMUSCULAR | Status: DC
  Administered 2020-04-15 – 2020-04-16 (×3): 80 mg via INTRAVENOUS
  Filled 2020-04-15 (×3): qty 8

## 2020-04-15 MED ORDER — VANCOMYCIN HCL IN DEXTROSE 1-5 GM/200ML-% IV SOLN
1000.0000 mg | INTRAVENOUS | Status: DC
  Administered 2020-04-16: 1000 mg via INTRAVENOUS
  Filled 2020-04-15: qty 200

## 2020-04-15 MED ORDER — SODIUM CHLORIDE 0.9 % IV SOLN
2.0000 g | Freq: Three times a day (TID) | INTRAVENOUS | Status: DC
  Administered 2020-04-15 – 2020-04-16 (×5): 2 g via INTRAVENOUS
  Filled 2020-04-15 (×5): qty 2

## 2020-04-15 NOTE — Plan of Care (Signed)
  Problem: Acute Rehab PT Goals(only PT should resolve) Goal: Patient Will Transfer Sit To/From Stand Outcome: Progressing Flowsheets (Taken 04/15/2020 1249) Patient will transfer sit to/from stand: Independently Goal: Pt Will Transfer Bed To Chair/Chair To Bed Outcome: Progressing Flowsheets (Taken 04/15/2020 1249) Pt will Transfer Bed to Chair/Chair to Bed: Independently Goal: Pt Will Ambulate Outcome: Progressing Flowsheets (Taken 04/15/2020 1249) Pt will Ambulate:  50 feet  with supervision   12:50 PM, 04/15/20 M. Sherlyn Lees, PT, DPT Physical Therapist- [place] Office Number: [phone]

## 2020-04-15 NOTE — Progress Notes (Signed)
Pharmacy Antibiotic Note  [name] is a 59 y.o. male admitted on 04/13/2020 with pneumonia.  Pharmacy has been consulted for Vancomycin and Cefepime dosing.  Plan: Vancomycin 1500 mg IV x 1 dose. Vancomycin 1000 mg IV every 24 hours. Expected AUC 421. Cefepime 2000 mg IV every 8 hours. Monitor labs, c/s, and vanco level as indicated.  Height: 5\' 7"  (170.2 cm) Weight: 54 kg (119 lb 0.8 oz) IBW/kg (Calculated) : 66.1  Temp (24hrs), Avg:97.6 F (36.4 C), Min:97.5 F (36.4 C), Max:97.9 F (36.6 C)  Recent Labs  Lab 04/13/20 2148 04/14/20 0050  WBC 10.8*  --   CREATININE 0.97  --   LATICACIDVEN  --  1.2    Estimated Creatinine Clearance: 64.2 mL/min (by C-G formula based on SCr of 0.97 mg/dL).    No Known Allergies  Antimicrobials this admission: Vanco 11/17 >>  Cefepime 11/17 >>    Microbiology results: 11/17 BCx: pending  11/17 Sputum: pending  11/17 MRSA PCR: pending  Thank you for allowing pharmacy to be a part of this patient's care.  Ramond Craver 04/15/2020 2:25 PM

## 2020-04-15 NOTE — TOC Progression Note (Signed)
Transition of Care [facility]) - Progression Note    Patient Details  Name: Atul Delucia MRN: [id number] Date of Birth: 12/07/1960  Transition of Care [facility]) CM/SW Contact  Ihor Gully, LCSW Phone Number: 04/15/2020, 2:32 PM  Clinical Narrative:    Patient wants to go to SNF in [facility]. Referral sent to [facility] in [place] via [place] at [facility] in [place]. Patient was previously declined by [facility] in [place] on 11/10 due to oxygen needs. TOC will continue to refer to facilities in [facility].  Expected Discharge Plan: [place] (With hospice services) Barriers to Discharge: Continued Medical Work up  Expected Discharge Plan and Services Expected Discharge Plan: [place] (With hospice services) In-house Referral: Clinical Social Work Discharge Planning Services: NA Post Acute Care Choice: Hospice Living arrangements for the past 2 months: [place]                 DME Arranged: N/A DME Agency: NA       HH Arranged: NA [place] Agency: NA         Social Determinants of Health (SDOH) Interventions    Readmission Risk Interventions Readmission Risk Prevention Plan 03/28/2020  Transportation Screening Complete  HRI or Home Care Consult Complete  Social Work Consult for [place] Planning/Counseling [place] Not Applicable  Medication Review [employer]) Complete  Some recent data might be hidden

## 2020-04-15 NOTE — Progress Notes (Signed)
Palliative: [name] is sitting up in bed.  He greets me making and somewhat keeping eye contact.  He is alert and oriented, able to make his needs known.  There is no family at bedside at this time.  He appears weak and frail, but nursing staff shares that he has just received morphine for pain.  [name] and I talked about his goals of care.  He tells me that at this point he would like to transfer to [facility] so that he could be closer to his family as he is nearing end-of-life.  He tells me that he is agreeable for PT evaluation and short-term rehab.  When the time is right, [name] is agreeable to transition to hospice care.  At this point, he would accept IV fluids and antibiotics if "they would help".  His goal at this point is "more time".  At this point [name] is not interested in hospice/comfort care.  Conference with attending, bedside nursing staff, transition of care team related to patient condition, needs, goals of care.  Plan: PT evaluation for short-term rehab.  Transition of care team is working for lateral transfer hopefully from [employer] to [facility] in [place].  Continue to treat the treatable but no CPR or intubation.  Open to hospice care in the future, but would like more time.  2 minutes Quinn Axe, NP Palliative Medicine Team Team Phone # [phone] Greater than 50% of this time was spent counseling and coordinating care related to the above assessment and plan.
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
## 2020-04-15 NOTE — Progress Notes (Signed)
PROGRESS NOTE  [name] WER:[id number] DOB: 1960/10/21 DOA: 04/13/2020 PCP: [name], MD  Brief History:   59 y.o. malepast medical history significant for coronary artery disease status post CABG, ischemic cardiomyopathy with an EF of 20% status post AICD placement, chronic hypoxic respiratory failure secondary to COPD on 4 L of oxygen, stage Ia non-small cell cancer diagnosed in February 2015 status post right lower lobe wedge resection as well as SBRT with a recurrent disease to the right lower lobe in June 2019 which has been in observation since then. Stage T1 adenocarcinoma of the prostate with a Gleason score of 4 diagnosed in November 2019 with a confirmed PET scan and biopsy of the right lower mass consistent with adenocarcinoma currently on chemotherapy, chemotherapy-induced anemia status post transfusion. Patient presented secondary to dyspnea.  Patient was recently hospitalized from 03/18/2020 to 04/10/2020 during which she had acute on chronic respiratory failure secondary to pneumonia as well as acute on chronic systolic and diastolic CHF.  The patient was diuresed with intravenous furosemide and treated with antibiotics.  He was stabilized and subsequently discharged to skilled nursing facility with hospice care.  He was seen by palliative medicine at that time and felt that he was a poor candidate to resume any chemotherapy.  Subsequently, the patient's AICD was deactivated on 04/04/2020.  The patient was discharged to [facility] up with palliative services.  Unfortunately, the patient represented back to the emergency department on 04/13/2020 secondary to worsening shortness of breath with increasing oxygen requirement.  Chest x-ray showed progressive coarsening of his interstitial opacities with stable left-sided volume loss and consolidation in the left apex.  Initially, the patient voiced that he did not want any antibiotics or aggressive therapy.  However after further  discussions with palliative medicine as well as the medicine team, the patient decided that he wanted to continue therapy at this time.  Assessment/Plan: Acute on chronic respiratory failure with hypoxia -Patient was discharged to SNF on 5 L. -Currently on 7 L nasal cannula -Secondary to pulmonary edema, COPD exacerbation, pneumonia, and possibly radiation pneumonitis and lymphangitic spread of his lung cancer.  Lobar pneumonia -Start vancomycin and cefepime -MRSA screen  Acute on chronic combined systolic and diastolic CHF -19/50/9326 echo EF<20%, moderate to severe dilated LV, severe diffuse HK -Restart furosemide 80 mg IV twice daily -Continue metoprolol succinate for now -Entresto and carvedilol were discontinued during his previous hospitalization  COPD exacerbation -Start Pulmicort -Start duo nebs -Start IV Solu-Medrol  Adenocarcinoma of the right lung Progressive disease. Not a candidate for chemotherapy secondary to severe deconditioning and heart failure. Palliative care consulted   SIRS --no s/s of sepsis or acute infection, procalcitonin high in patient w/ cancer --initially patient did not want abx or intervention --now wants work up -PCT 16.26  Hyponatremia --secondary fluid overload, possibly low solute intake --supportive care  Anemia of chronic disease --no bleeding reported. Supportive care based on [place]  Goals of care -Palliative medicine is following -Advance care planning, including the explanation and discussion of advance directives was carried out with the patient and family.  Code status including explanations of "Full Code" and "DNR" and alternatives were discussed in detail.  Discussion of end-of-life issues including but not limited palliative care, hospice care and the concept of hospice, other end-of-life care options, power of attorney for health care decisions, living wills, and physician orders for life-sustaining treatment were also discussed  with the patient and family.  Total face to face time 16 minutes. -Overall prognosis remains poor -Patient states that he wants continued medical treatment including antibiotics and diuresis for now    Status is: Inpatient  Remains inpatient appropriate because:IV treatments appropriate due to intensity of illness or inability to take PO   Dispo: The patient is from: Home              Anticipated d/c is to: Home              Anticipated d/c date is: 2 days              Patient currently is not medically stable to d/c.        Family Communication:   No family at bedside  Consultants:  Palliative medicine  Code Status:   DNR  DVT Prophylaxis:  [place] Heparin    Procedures: As Listed in Progress Note Above  Antibiotics: Vanco 11/17>> Cefepime 11/17>>    Subjective: Patient complains of shortness of breath.  He denies any fevers, chills, chest pain, hemoptysis, nausea vomiting or diarrhea, abdominal pain.  There is no dysuria or hematuria. Objective: Vitals:   04/15/20 0449 04/15/20 0752 04/15/20 0800 04/15/20 1240  BP: (!) 85/56  94/66 102/68  Pulse: 98  98 92  Resp: (!) 24  20   Temp: (!) 97.5 F (36.4 C)  (!) 97.5 F (36.4 C)   TempSrc:      SpO2: 96% 96% 97% 98%  Weight:      Height:        Intake/Output Summary (Last 24 hours) at 04/15/2020 1418 Last data filed at 04/15/2020 1028 Gross per 24 hour  Intake --  Output 200 ml  Net -200 ml   Weight change:  Exam:   General:  Pt is alert, follows commands appropriately, not in acute distress  HEENT: No icterus, No thrush, No neck mass, [place]/AT  Cardiovascular: RRR, S1/S2, no rubs, no gallops  Respiratory: Bilateral crackles but no wheezing.  Good air movement  Abdomen: Soft/+BS, non tender, non distended, no guarding  Extremities: No edema, No lymphangitis, No petechiae, No rashes, no synovitis   Data Reviewed: I have personally reviewed following labs and imaging studies Basic Metabolic  Panel: Recent Labs  Lab 04/13/20 2148  NA 130*  K 4.1  CL 85*  CO2 36*  GLUCOSE 131*  BUN 30*  CREATININE 0.97  CALCIUM 8.8*   Liver Function Tests: Recent Labs  Lab 04/13/20 2148  AST 11*  ALT 12  ALKPHOS 69  BILITOT 0.4  PROT 7.3  ALBUMIN 2.6*   No results for input(s): LIPASE, AMYLASE in the last 168 hours. No results for input(s): AMMONIA in the last 168 hours. Coagulation Profile: No results for input(s): INR, PROTIME in the last 168 hours. CBC: Recent Labs  Lab 04/13/20 2148  WBC 10.8*  NEUTROABS 8.1*  HGB 7.4*  HCT 23.4*  MCV 93.2  PLT 361   Cardiac Enzymes: No results for input(s): CKTOTAL, CKMB, CKMBINDEX, TROPONINI in the last 168 hours. BNP: Invalid input(s): POCBNP CBG: No results for input(s): GLUCAP in the last 168 hours. HbA1C: No results for input(s): HGBA1C in the last 72 hours. Urine analysis:    Component Value Date/Time   COLORURINE YELLOW 04/14/2020 [place] 04/14/2020 0450   LABSPEC 1.013 04/14/2020 0450   PHURINE 5.0 04/14/2020 [place] 04/14/2020 0450   HGBUR NEGATIVE 04/14/2020 0450   BILIRUBINUR NEGATIVE 04/14/2020 0450   KETONESUR NEGATIVE  04/14/2020 0450   PROTEINUR NEGATIVE 04/14/2020 0450   UROBILINOGEN 0.2 08/06/2013 1436   NITRITE NEGATIVE 04/14/2020 0450   LEUKOCYTESUR NEGATIVE 04/14/2020 0450   Sepsis Labs: @LABRCNTIP (procalcitonin:4,lacticidven:4) ) Recent Results (from the past 240 hour(s))  SARS Coronavirus 2 by RT PCR (hospital order, performed in [facility] hospital lab) Nasopharyngeal Nasopharyngeal Swab     Status: None   Collection Time: 04/09/20 10:08 AM   Specimen: Nasopharyngeal Swab  Result Value Ref Range Status   SARS Coronavirus 2 NEGATIVE NEGATIVE Final    Comment: (NOTE) SARS-CoV-2 target nucleic acids are NOT DETECTED.  The SARS-CoV-2 RNA is generally detectable in upper and lower respiratory specimens during the acute phase of infection. The  lowest concentration of SARS-CoV-2 viral copies this assay can detect is 250 copies / mL. A negative result does not preclude SARS-CoV-2 infection and should not be used as the sole basis for treatment or other patient management decisions.  A negative result may occur with improper specimen collection / handling, submission of specimen other than nasopharyngeal swab, presence of viral mutation(s) within the areas targeted by this assay, and inadequate number of viral copies (<250 copies / mL). A negative result must be combined with clinical observations, patient history, and epidemiological information.  Fact Sheet for Patients:   StrictlyIdeas.no  Fact Sheet for Healthcare Providers: BankingDealers.co.za  This test is not yet approved or  cleared by the [place] FDA and has been authorized for detection and/or diagnosis of SARS-CoV-2 by FDA under an Emergency Use Authorization (EUA).  This EUA will remain in effect (meaning this test can be used) for the duration of the COVID-19 declaration under Section 564(b)(1) of the Act, 21 U.S.C. section 360bbb-3(b)(1), unless the authorization is terminated or revoked sooner.  Performed at [facility], [place] [address], [place], [place] [address]   Resp Panel by RT PCR (RSV, Flu A&B, Covid) - Nasopharyngeal Swab     Status: None   Collection Time: 04/13/20  9:48 PM   Specimen: Nasopharyngeal Swab  Result Value Ref Range Status   SARS Coronavirus 2 by RT PCR NEGATIVE NEGATIVE Final    Comment: (NOTE) SARS-CoV-2 target nucleic acids are NOT DETECTED.  The SARS-CoV-2 RNA is generally detectable in upper respiratoy specimens during the acute phase of infection. The lowest concentration of SARS-CoV-2 viral copies this assay can detect is 131 copies/mL. A negative result does not preclude SARS-Cov-2 infection and should not be used as the sole basis for treatment or other patient  management decisions. A negative result may occur with  improper specimen collection/handling, submission of specimen other than nasopharyngeal swab, presence of viral mutation(s) within the areas targeted by this assay, and inadequate number of viral copies (<131 copies/mL). A negative result must be combined with clinical observations, patient history, and epidemiological information. The expected result is Negative.  Fact Sheet for Patients:  PinkCheek.be  Fact Sheet for Healthcare Providers:  GravelBags.it  This test is no t yet approved or cleared by the [place] FDA and  has been authorized for detection and/or diagnosis of SARS-CoV-2 by FDA under an Emergency Use Authorization (EUA). This EUA will remain  in effect (meaning this test can be used) for the duration of the COVID-19 declaration under Section 564(b)(1) of the Act, 21 U.S.C. section 360bbb-3(b)(1), unless the authorization is terminated or revoked sooner.     Influenza A by PCR NEGATIVE NEGATIVE Final   Influenza B by PCR NEGATIVE NEGATIVE Final    Comment: (NOTE)  The Xpert Xpress SARS-CoV-2/FLU/RSV assay is intended as an aid in  the diagnosis of influenza from Nasopharyngeal swab specimens and  should not be used as a sole basis for treatment. Nasal washings and  aspirates are unacceptable for Xpert Xpress SARS-CoV-2/FLU/RSV  testing.  Fact Sheet for Patients: PinkCheek.be  Fact Sheet for Healthcare Providers: GravelBags.it  This test is not yet approved or cleared by the [place] FDA and  has been authorized for detection and/or diagnosis of SARS-CoV-2 by  FDA under an Emergency Use Authorization (EUA). This EUA will remain  in effect (meaning this test can be used) for the duration of the  Covid-19 declaration under Section 564(b)(1) of the Act, 21  U.S.C. section 360bbb-3(b)(1),  unless the authorization is  terminated or revoked.    Respiratory Syncytial Virus by PCR NEGATIVE NEGATIVE Final    Comment: (NOTE) Fact Sheet for Patients: PinkCheek.be  Fact Sheet for Healthcare Providers: GravelBags.it  This test is not yet approved or cleared by the [place] FDA and  has been authorized for detection and/or diagnosis of SARS-CoV-2 by  FDA under an Emergency Use Authorization (EUA). This EUA will remain  in effect (meaning this test can be used) for the duration of the  COVID-19 declaration under Section 564(b)(1) of the Act, 21 U.S.C.  section 360bbb-3(b)(1), unless the authorization is terminated or  revoked. Performed at [facility], [address], [place], [place] [address]      Scheduled Meds: . feeding supplement  237 mL Oral BID BM  . folic acid  1 mg Oral Daily  . furosemide  80 mg Intravenous BID  . heparin  5,000 Units Subcutaneous Q8H  . metoprolol succinate  25 mg Oral Daily  . mometasone-formoterol  2 puff Inhalation BID  . umeclidinium bromide  1 puff Inhalation Daily   Continuous Infusions:  Procedures/Studies: DG Chest 1 View  Result Date: 04/06/2020 CLINICAL DATA:  59 year old male with shortness of breath. EXAM: CHEST  1 VIEW COMPARISON:  Chest radiograph dated 04/04/2020. FINDINGS: Right-sided Port-A-Cath with tip at the cavoatrial junction. Diffuse chronic interstitial coarsening as seen on the prior radiograph. No new consolidation. No large pleural effusion or pneumothorax. Thickening of the left upper lobe pleural surface. Left upper lobe cavitary lesion with peripheral air. There is stable cardiomegaly. Left pectoral AICD device. Median sternotomy wires and coronary stent. No acute osseous pathology. IMPRESSION: 1. No new consolidation. Overall no significant interval change in the appearance of the lungs compared to the prior radiograph. 2. Left upper lobe cavitary lesion  with peripheral air. 3. Stable cardiomegaly. Electronically Signed   By: Anner Crete M.D.   On: 04/06/2020 22:45   DG Chest 1 View  Result Date: 03/29/2020 CLINICAL DATA:  Short of breath.  History of lung carcinoma. EXAM: CHEST  1 VIEW COMPARISON:  03/18/2020 and earlier exams. FINDINGS: Stable changes from previous cardiac surgery. Left coronary artery stents. Masslike opacity superimposed over the right hilum is stable. No convincing mediastinal masses. Left hemithorax volume loss with mediastinal shift to the left. There is consolidation in the left lung most apparent in the upper lung, as well as linear/reticular type opacities, stable from the prior study. On the right, patchy airspace opacity is noted in the mid and lower lung superimposed on chronic interstitial thickening. The lung is hyperexpanded. No pneumothorax. Stable left anterior chest wall AICD and right anterior chest wall Port-A-Cath. IMPRESSION: 1. New patchy airspace opacity in the right mid to lower lung superimposed on chronic  interstitial thickening. New lung opacities are suspicious for superimposed pneumonia. 2. No other change from the prior exam. Extensive chronic changes as detailed. Electronically Signed   By: Lajean Manes M.D.   On: 03/29/2020 08:39   CT ANGIO CHEST PE W OR WO CONTRAST  Result Date: 03/30/2020 CLINICAL DATA:  Pulmonary fibrosis, lung cancer, dyspnea EXAM: CT ANGIOGRAPHY CHEST WITH CONTRAST TECHNIQUE: Multidetector CT imaging of the chest was performed using the standard protocol during bolus administration of intravenous contrast. Multiplanar CT image reconstructions and MIPs were obtained to evaluate the vascular anatomy. CONTRAST:  145mL OMNIPAQUE IOHEXOL 350 MG/ML SOLN COMPARISON:  01/06/2020 FINDINGS: Cardiovascular: There is excellent opacification of the pulmonary arterial tree. There is no intraluminal filling defect identified to suggest acute pulmonary embolism. The central pulmonary arteries  are enlarged in keeping with changes of pulmonary arterial hypertension. Coronary artery bypass grafting has been performed with stenting within a left anterior descending saphenous vein graft. There is mild global cardiomegaly with moderate left ventricular dilation and superimposed mild left ventricular hypertrophy. No pericardial effusion. Left subclavian single lead pacemaker is seen with its lead within the right ventricle. Right internal jugular chest port tip is seen within the right atrium. The thoracic aorta demonstrates mild atherosclerotic calcification, but is otherwise unremarkable. Mediastinum/Nodes: Thyroid unremarkable. Pathologic pre-vascular mediastinal adenopathy is again identified exact measurement is difficult due to streak artifact from overlying pacemaker, however, the index lymph node demonstrates progressive enlargement, measuring 2.0 x 2.8 cm at axial image # 47/5. Right hilar adenopathy has progressed with the index lymph node within the right hilum measuring 1.7 x 2.1 cm at axial image # 63/5. thyroid unremarkable. Esophagus unremarkable. Lungs/Pleura: Severe, end-stage cystic lung disease within the left apex with associated left-sided volume loss is again identified with probable 5 cm aspergillosis a again identified at the left apex. Moderate severe centrilobular paraseptal emphysema. There is interval development of diffuse ground-glass pulmonary infiltrate throughout the residual pulmonary parenchyma and new consolidation at the right lung base subpleurally, likely infectious in the appropriate clinical setting. Alternatively, diffuse infiltrate could relate to pulmonary edema with consolidation at the right lung base representing superimposed infection should or changes of radiation pneumonitis in the appropriate clinical setting. Pleural base mass at the right lung base demonstrates interval increase in size measuring 3.7 x 4.4 cm at axial image # 99/11 Upper Abdomen: Extensive  reflux of contrast into the hepatic venous system secondary to at least some degree of right heart failure. No acute abnormality. Musculoskeletal: No acute bone abnormality. Review of the MIP images confirms the above findings. IMPRESSION: Interval development of diffuse ground-glass pulmonary infiltrate and right basilar subpleural consolidation. This may relate to diffuse infection or, more likely, diffuse pulmonary edema with superimposed infectious or post radiation change at the right lung base. 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 Superimposed moderate emphysema. Interval disease progression with enlargement of the neoplastic mass at the right lung base and developing right hilar adenopathy. Progressive pre-vascular adenopathy of unclear etiology. Moderate left ventricular dilation. Reflux of contrast into the hepatic venous vasculature in keeping with at least some degree of right heart failure. No pulmonary embolism. Aortic Atherosclerosis (ICD10-I70.0) and Emphysema (ICD10-J43.9). Electronically Signed   By: Fidela Salisbury MD   On: 03/30/2020 22:33   DG Chest Port 1 View  Result Date: 04/13/2020 CLINICAL DATA:  Dyspnea, lung cancer, pneumonia EXAM: PORTABLE CHEST 1 VIEW COMPARISON:  Multiple including 04/06/2020, 04/04/2020, and 03/18/2020 FINDINGS: Left-sided volume loss with  multiple cystic lucencies are again identified at the left apex with superimposed consolidation at the left apex, likely corresponding to the aspirate the lung a noted on prior CT examination of 03/30/2020. Progressive coarse interstitial pulmonary infiltrate throughout the right lung, likely infectious in etiology. Tiny right pleural effusion now present. No pneumothorax. Medial right basilar spiculated mass is better visualized than on prior examination, likely corresponding to the primary malignancy. Right internal jugular chest port tip is unchanged within the superior cavoatrial  junction. Moderate cardiomegaly with stenting of a saphenous vein bypass graft is again noted. Left subclavian single lead pacemaker defibrillator is unchanged. Median sternotomy has been performed. IMPRESSION: Progressive coarse interstitial infiltrate throughout the right lung most in keeping with progressive pneumonic infiltrate. 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 Stable spiculated neoplastic mass at the medial right lung base. Unchanged cardiomegaly. Electronically Signed   By: Fidela Salisbury MD   On: 04/13/2020 22:23   DG CHEST PORT 1 VIEW  Result Date: 04/04/2020 CLINICAL DATA:  Dyspnea, follow-up lung opacities, history of right lung cancer EXAM: PORTABLE CHEST 1 VIEW COMPARISON:  03/29/2020 chest radiograph. FINDINGS: Right internal jugular Port-A-Cath terminates over the cavoatrial junction. Stable configuration of single lead left subclavian ICD. Intact median sternotomy wires. Stable cardiomediastinal silhouette with mild cardiomegaly. No pneumothorax. Chronic bilateral costophrenic angle blunting. Chronic dense consolidation, volume loss and distortion in upper left lung with associated bronchiectasis and chronic mycetoma as seen on prior chest CT. Patchy opacity throughout both lungs, slightly worsened in the upper right lung and improved at the right lung base. Known spiculated right retrocardiac mass. IMPRESSION: 1. Patchy opacity throughout both lungs, slightly worsened in the upper right lung and improved at the right lung base, favor multifocal pneumonia, with a component of pulmonary edema not excluded. 2. Known spiculated right retrocardiac lung mass. 3. Chronic dense consolidation, volume loss and distortion in the upper left lung with associated bronchiectasis and chronic mycetoma as seen on prior chest CT. Electronically Signed   By: Ilona Sorrel M.D.   On: 04/04/2020 15:58   DG Chest Portable  1 View  Result Date: 03/18/2020 CLINICAL DATA:  Shortness of breath, lung cancer.  Swelling in legs. EXAM: PORTABLE CHEST 1 VIEW COMPARISON:  Chest radiograph 03/05/2019.  Chest CT 01/06/2020 FINDINGS: Similar opacification left lung apex with interspersed lucencies, characterized as bronchiectasis, chronic lung disease, and probable large aspirate alone a on prior CT. Right lower lobe mass was better characterized on recent CT. There is new mild diffuse interstitial prominence, suggestive of mild interstitial edema. Pulmonary vascular congestion. Suspected small bilateral pleural effusions. No discernible pneumothorax. Right Port-A-Cath with the tip projecting at the cavoatrial junction. Left subclavian approach cardiac rhythm maintenance device. IMPRESSION: 1. New mild diffuse interstitial prominence, pulmonary vascular congestion, and suspected small bilateral pleural effusions, possibly related to mild pulmonary edema. Atypical pneumonia is a consideration. 2. Right lower lobe mass was better characterized on recent CT. 3. Similar chronic changes in the left lung apex, as detailed above. Electronically Signed   By: Margaretha Sheffield MD   On: 03/18/2020 09:38   ECHOCARDIOGRAM COMPLETE  Result Date: 03/18/2020    ECHOCARDIOGRAM REPORT   Patient Name:   [facility] Date of Exam: 03/18/2020 Medical Rec #:  [id number]     Height:       68.0 in Accession #:    [id number]    Weight:  140.5 lb Date of Birth:  Aug 29, 1960     BSA:          1.759 m Patient Age:    42 years      BP:           132/76 mmHg Patient Gender: M             HR:           115 bpm. Exam Location:  Inpatient Procedure: 2D Echo Indications:    CHF 428  History:        Patient has prior history of Echocardiogram examinations, most                 recent 03/01/2018. CHF and Cardiomyopathy, CAD and Previous                 Myocardial Infarction, Defibrillator and Prior CABG, COPD; Risk                 Factors:Dyslipidemia and Former Smoker.  AICD. STEMI.  Sonographer:    Jannett Celestine RDCS (AE) Referring Phys: [id number] Michell Heinrich PAHWANI IMPRESSIONS  1. Left ventricular ejection fraction, by estimation, is <20%. The left ventricle has severely decreased function. The left ventricle demonstrates regional wall motion abnormalities (see scoring diagram/findings for description). The left ventricular internal cavity size was moderately to severely dilated. There is mild concentric left ventricular hypertrophy. Left ventricular diastolic function could not be evaluated. There is diffuse severe hypokinesis with akinesis of the distal to apical segments  of all walls.  2. Right ventricular systolic function was not well visualized. The right ventricular size is not well visualized.  3. The mitral valve is normal in structure. Mild to moderate mitral valve regurgitation.  4. The aortic valve is grossly normal. Aortic valve regurgitation is mild. No aortic stenosis is present. Comparison(s): Prior images unable to be directly viewed, comparison made by report only. Conclusion(s)/Recommendation(s): Severely reduced LVEF with both global and focal wall motion abnormalities. FINDINGS  Left Ventricle: LV apical thrombus excluded by contrast. Left ventricular ejection fraction, by estimation, is <20%. The left ventricle has severely decreased function. The left ventricle demonstrates regional wall motion abnormalities. The left ventricular internal cavity size was moderately to severely dilated. There is mild concentric left ventricular hypertrophy. Left ventricular diastolic function could not be evaluated.  LV Wall Scoring: There is diffuse severe hypokinesis with akinesis of the distal to apical segments of all walls. Right Ventricle: The right ventricular size is not well visualized. Right vetricular wall thickness was not assessed. Right ventricular systolic function was not well visualized. Left Atrium: Left atrial size was not assessed. Right Atrium: Right atrial  size was not assessed. Pericardium: There is no evidence of pericardial effusion. Mitral Valve: The mitral valve is normal in structure. Mild to moderate mitral valve regurgitation. Tricuspid Valve: The tricuspid valve is normal in structure. Tricuspid valve regurgitation is trivial. No evidence of tricuspid stenosis. Aortic Valve: The aortic valve is grossly normal. Aortic valve regurgitation is mild. No aortic stenosis is present. Pulmonic Valve: The pulmonic valve was grossly normal. Pulmonic valve regurgitation is not visualized. Aorta: The aortic root is normal in size and structure. Venous: IVC assessment for right atrial pressure unable to be performed due to mechanical ventilation. IAS/Shunts: The interatrial septum was not assessed. Additional Comments: A pacer wire is visualized in the right atrium and right ventricle.  LEFT VENTRICLE PLAX 2D LVIDd:         6.20 cm LVIDs:  5.20 cm LV PW:         1.20 cm LV IVS:        1.10 cm LVOT diam:     2.10 cm LV SV:         30 LV SV Index:   17 LVOT Area:     3.46 cm  LEFT ATRIUM         Index LA diam:    3.80 cm 2.16 cm/m  AORTIC VALVE LVOT Vmax:   59.70 cm/s LVOT Vmean:  44.200 cm/s LVOT VTI:    0.087 m  AORTA Ao Root diam: 3.30 cm MITRAL VALVE MV Area (PHT): 4.68 cm    SHUNTS MV Decel Time: 162 msec    Systemic VTI:  0.09 m MR Peak grad: 67.2 mmHg    Systemic Diam: 2.10 cm MR Mean grad: 41.0 mmHg MR Vmax:      410.00 cm/s MR Vmean:     300.0 cm/s MV E velocity: 85.10 cm/s MV A velocity: 60.60 cm/s MV E/A ratio:  1.40 Buford Dresser MD Electronically signed by Buford Dresser MD Signature Date/Time: 03/18/2020/8:41:25 PM    Final     Orson Eva, DO  [facility]  If 7PM-7AM, please contact night-coverage [email or web address] Password [email or web address] 04/15/2020, 2:18 PM   LOS: 1 day

## 2020-04-15 NOTE — Progress Notes (Signed)
Initial visit for support-his nurse's request due to her interaction with him about end of life. Listening support as he expressed feelings of grief related his perception of failing family members and others, his hospitalization and "being told his life is short", and what he describes as a disconnect with God. We discussed this points directed towards what his hopes might be in all these areas. We discussed his faith and what a reconnection would be for him. I encouraged family discussion and addressing the expressed regrets with them. Provided space for him to process his "burdens" of his illness. We prayed--forgiveness and presence. I encouraged him to continue to seek that connection. Will follow up.

## 2020-04-15 NOTE — Evaluation (Addendum)
Physical Therapy Evaluation Patient Details Name: [name] MRN: [id number] DOB: 10-29-1960 Today's Date: 04/15/2020   History of Present Illness  Christoper Bushey  is a 59 y.o. male, with history of of childhood TB, MI, prostate cancer, lung cancer, COPD, CAD, CHF 2/2 ischemic cardiomyopathy and more presents to the ED with a chief complaint of dyspnea. Patient reports that it started at 6:00pm on 04/13/20. It was gradual in onset and associated with intermittent palpitations. Patient denies chest pain, nausea, diaphoresis, orthopnea, peripheral edema. Patient is not very forthcoming with details and reports that he is "about to die" and wishes that he would just "die and it would be over with." Patient was supposed to have hospice consult at SNF, but for unknown reasons that did not happen. Patient reports that when he started to feel short of breath, they turned his O2 up to 5L. When that did not relieve his symptoms he was sent to the ER.  Patient was recently admitted 10/20 - 11/12. He was supposed to be under hospice care at discharge. At that time he had been admitted for dyspnea x 2 weeks. At that time he had a twenty point weight gain associated with his dyspnea at that time. He was treated for acute on chronic combined systolic and diastolic heart failure, and at that time he required up to 15L HFNC. He was weaned down to 5L [place] per DC summary, but patient says he was actually able to get down to 4 at the SNF.    Clinical Impression  Patient exhibits generalized weakness and functional activity tolerance deficits limiting mobility and requiring contact-guarding for mobility due to unsteadiness on feet.  Patient tolerates PT evaluation well without adverse effects. Frequent rest periods and activity pacing required for functional activities. Patient will benefit from continued physical therapy in hospital and recommended venue below to increase strength, balance, endurance for safe ADLs and  gait.    Follow Up Recommendations SNF    Equipment Recommendations  Rolling walker with 5" wheels;3in1 (PT)    Recommendations for Other Services       Precautions / Restrictions Precautions Precautions: Fall Restrictions Weight Bearing Restrictions: No      Mobility  Bed Mobility Overal bed mobility: Independent                  Transfers Overall transfer level: Needs assistance Equipment used: None Transfers: Sit to/from Stand;Stand Pivot Transfers Sit to Stand: Min guard Stand pivot transfers: Min guard       General transfer comment: unsteadiness due to generalized weakness and UE support on furniture for support  Ambulation/Gait Ambulation/Gait assistance: Min guard Gait Distance (Feet): 30 Feet Assistive device: Rolling walker (2 wheeled) Gait Pattern/deviations: Step-through pattern;Decreased stride length Gait velocity: decreased      Stairs            Wheelchair Mobility    Modified Rankin (Stroke Patients Only)       Balance Overall balance assessment: Mild deficits observed, not formally tested                                           Pertinent Vitals/Pain Pain Assessment: No/denies pain    Home Living Family/patient expects to be discharged to:: Private residence Living Arrangements: Alone;Other relatives Available Help at Discharge: Family Type of Home: Apartment Home Access: Level entry     Home Layout:  One level Home Equipment: Bedside commode;Walker - 2 wheels      Prior Function Level of Independence: Independent         Comments: Pt. states he has difficulty with cooking and cleaning.      Hand Dominance   Dominant Hand: Right    Extremity/Trunk Assessment   Upper Extremity Assessment Upper Extremity Assessment: Generalized weakness    Lower Extremity Assessment Lower Extremity Assessment: Generalized weakness    Cervical / Trunk Assessment Cervical / Trunk Assessment:  Normal  Communication   Communication: No difficulties  Cognition Arousal/Alertness: Awake/alert Behavior During Therapy: Flat affect Overall Cognitive Status: Within Functional Limits for tasks assessed                                        General Comments General comments (skin integrity, edema, etc.): unsteadiness from generalized weakness/fatigue    Exercises     Assessment/Plan    PT Assessment Patient needs continued PT services  PT Problem List Decreased activity tolerance;Decreased balance;Decreased mobility;Decreased knowledge of use of DME;Decreased knowledge of precautions;Cardiopulmonary status limiting activity;Decreased strength       PT Treatment Interventions DME instruction;Gait training;Functional mobility training;Therapeutic activities;Therapeutic exercise;Balance training;Patient/family education    PT Goals (Current goals can be found in the Care Plan section)  Acute Rehab PT Goals Patient Stated Goal: Be able to return home with some assistance PT Goal Formulation: With patient Time For Goal Achievement: 04/22/20 Potential to Achieve Goals: Good    Frequency Min 3X/week   Barriers to discharge Decreased caregiver support Pt reports family member (cousin) will be assisting/staying with him at D/C    Co-evaluation               AM-PAC PT "6 Clicks" Mobility  Outcome Measure Help needed turning from your back to your side while in a flat bed without using bedrails?: None Help needed moving from lying on your back to sitting on the side of a flat bed without using bedrails?: None Help needed moving to and from a bed to a chair (including a wheelchair)?: A Little Help needed standing up from a chair using your arms (e.g., wheelchair or bedside chair)?: A Little Help needed to walk in hospital room?: A Little Help needed climbing 3-5 steps with a railing? : A Lot 6 Click Score: 19    End of Session Equipment Utilized During  Treatment: Oxygen Activity Tolerance: Patient tolerated treatment well;Patient limited by fatigue Patient left: in chair;with call bell/phone within reach Nurse Communication: Mobility status PT Visit Diagnosis: Unsteadiness on feet (R26.81);Other abnormalities of gait and mobility (R26.89);Muscle weakness (generalized) (M62.81)    Time: 6659-9357 PT Time Calculation (min) (ACUTE ONLY): 35 min   Charges:   PT Evaluation $PT Eval Moderate Complexity: 1 Mod PT Treatments $Gait Training: 8-22 mins $Therapeutic Activity: 8-22 mins        12:49 PM, 04/15/20 M. Sherlyn Lees, PT, DPT Physical Therapist- [place] Office Number: [phone]

## 2020-04-16 ENCOUNTER — Inpatient Hospital Stay: Payer: Medicare HMO

## 2020-04-16 ENCOUNTER — Ambulatory Visit: Payer: Medicare HMO | Admitting: Internal Medicine

## 2020-04-16 ENCOUNTER — Inpatient Hospital Stay (HOSPITAL_COMMUNITY): Payer: Medicare HMO

## 2020-04-16 ENCOUNTER — Inpatient Hospital Stay: Payer: Medicare HMO | Admitting: Physician Assistant

## 2020-04-16 DIAGNOSIS — E43 Unspecified severe protein-calorie malnutrition: Secondary | ICD-10-CM

## 2020-04-16 DIAGNOSIS — J441 Chronic obstructive pulmonary disease with (acute) exacerbation: Secondary | ICD-10-CM | POA: Diagnosis not present

## 2020-04-16 DIAGNOSIS — Z515 Encounter for palliative care: Secondary | ICD-10-CM | POA: Diagnosis not present

## 2020-04-16 DIAGNOSIS — C3491 Malignant neoplasm of unspecified part of right bronchus or lung: Secondary | ICD-10-CM

## 2020-04-16 DIAGNOSIS — I5043 Acute on chronic combined systolic (congestive) and diastolic (congestive) heart failure: Secondary | ICD-10-CM | POA: Diagnosis not present

## 2020-04-16 DIAGNOSIS — A419 Sepsis, unspecified organism: Secondary | ICD-10-CM

## 2020-04-16 DIAGNOSIS — Z7189 Other specified counseling: Secondary | ICD-10-CM | POA: Diagnosis not present

## 2020-04-16 DIAGNOSIS — J9621 Acute and chronic respiratory failure with hypoxia: Secondary | ICD-10-CM | POA: Diagnosis not present

## 2020-04-16 LAB — BASIC METABOLIC PANEL
Anion gap: 11 (ref 5–15)
BUN: 41 mg/dL — ABNORMAL HIGH (ref 6–20)
CO2: 34 mmol/L — ABNORMAL HIGH (ref 22–32)
Calcium: 9.3 mg/dL (ref 8.9–10.3)
Chloride: 86 mmol/L — ABNORMAL LOW (ref 98–111)
Creatinine, Ser: 1.08 mg/dL (ref 0.61–1.24)
GFR, Estimated: >60 mL/min (ref >60)
Glucose, Bld: 184 mg/dL — ABNORMAL HIGH (ref 70–99)
Potassium: 4.4 mmol/L (ref 3.5–5.1)
Sodium: 131 mmol/L — ABNORMAL LOW (ref 135–145)

## 2020-04-16 LAB — CBC
HCT: 23.6 % — ABNORMAL LOW (ref 39.0–52.0)
Hemoglobin: 7.1 g/dL — ABNORMAL LOW (ref 13.0–17.0)
MCH: 28.6 pg (ref 26.0–34.0)
MCHC: 30.1 g/dL (ref 30.0–36.0)
MCV: 95.2 fL (ref 80.0–100.0)
Platelets: 440 10*3/uL — ABNORMAL HIGH (ref 150–400)
RBC: 2.48 MIL/uL — ABNORMAL LOW (ref 4.22–5.81)
RDW: 17.2 % — ABNORMAL HIGH (ref 11.5–15.5)
WBC: 10.5 10*3/uL (ref 4.0–10.5)
nRBC: 0.9 % — ABNORMAL HIGH (ref 0.0–0.2)

## 2020-04-16 LAB — D-DIMER, QUANTITATIVE: D-Dimer, Quant: 3.63 ug/mL-FEU — ABNORMAL HIGH (ref 0.00–0.50)

## 2020-04-16 LAB — MRSA PCR SCREENING: MRSA by PCR: NEGATIVE

## 2020-04-16 LAB — PROCALCITONIN: Procalcitonin: 18.15 ng/mL

## 2020-04-16 MED ORDER — IOHEXOL 350 MG/ML SOLN
75.0000 mL | Freq: Once | INTRAVENOUS | Status: AC | PRN
  Administered 2020-04-16: 75 mL via INTRAVENOUS

## 2020-04-16 MED ORDER — CHLORHEXIDINE GLUCONATE CLOTH 2 % EX PADS
6.0000 | MEDICATED_PAD | Freq: Every day | CUTANEOUS | Status: DC
  Administered 2020-04-16 – 2020-04-20 (×5): 6 via TOPICAL

## 2020-04-16 MED ORDER — HYDROMORPHONE HCL 1 MG/ML IJ SOLN
0.5000 mg | INTRAMUSCULAR | Status: DC | PRN
  Administered 2020-04-16 – 2020-04-20 (×18): 0.5 mg via INTRAVENOUS
  Filled 2020-04-16 (×18): qty 0.5

## 2020-04-16 NOTE — Progress Notes (Signed)
PROGRESS NOTE  [name] JFH:[id number] DOB: [birth year] DOA: 04/13/2020 PCP: [name], MD  Brief History:  59 y.o.malepast medical history significant for coronary artery disease status post CABG, ischemic cardiomyopathy with an EF of 20% status post AICD placement, chronic hypoxic respiratory failure secondary to COPD on 4 L of oxygen, stage Ia non-small cell cancer diagnosed in February 2015 status post right lower lobe wedge resection as well as SBRT with a recurrent disease to the right lower lobe in June 2019 which has been in observation since then. Stage T1 adenocarcinoma of the prostate with a Gleason score of 4 diagnosed in November 2019 with a confirmed PET scan and biopsy of the right lower mass consistent with adenocarcinoma currently on chemotherapy, chemotherapy-induced anemia status post transfusion. Patient presented secondary to dyspnea.  Patient was recently hospitalized from 03/18/2020 to 04/10/2020 during which she had acute on chronic respiratory failure secondary to pneumonia as well as acute on chronic systolic and diastolic CHF.  The patient was diuresed with intravenous furosemide and treated with antibiotics.  He was stabilized and subsequently discharged to skilled nursing facility with hospice care.  He was seen by palliative medicine at that time and felt that he was a poor candidate to resume any chemotherapy.  Subsequently, the patient's AICD was deactivated on 04/04/2020.  The patient was discharged to [facility] up with palliative services.  Unfortunately, the patient represented back to the emergency department on 04/13/2020 secondary to worsening shortness of breath with increasing oxygen requirement.  Chest x-ray showed progressive coarsening of his interstitial opacities with stable left-sided volume loss and consolidation in the left apex.  Initially, the patient voiced that he did not want any antibiotics or aggressive therapy.  However after further  discussions with palliative medicine as well as the medicine team, the patient decided that he wanted to continue therapy at this time.  Assessment/Plan: Acute on chronic respiratory failure with hypoxia -Patient was discharged to SNF on 5 L. -Currently on 7 L nasal cannula>>5L -Secondary to pulmonary edema, COPD exacerbation, pneumonia, and possibly radiation pneumonitis and lymphangitic spread of his lung cancer.  Lobar pneumonia -Conitnue vancomycin and cefepime -MRSA screen  Acute on chronic combined systolic and diastolic CHF -93/73/4287 echo EF<20%, moderate to severe dilated LV, severe diffuse HK -Restarted furosemide 80 mg IV twice daily -Continue metoprolol succinate for now -Entresto and carvedilol--could not tolerate previously due to hypotension -cardiology consult  COPD exacerbation -continue Pulmicort -continue duo nebs -continue IV Solu-Medrol  Adenocarcinoma of the right lung Progressive disease. Not a candidate for chemotherapy secondary to severe deconditioning and heart failure (03/16/20 note). Palliative care consulted  -Patient is followed by Dr. [name] and had been receiving systemic chemotherapy every 3 weeks  Sepsis --present on admission -due to pneumonia --initially patient did not want abx or intervention --now wants work up and abx --PCT 16.26  Prostate adenocarcinoma -Diagnosed in November 2019.  Patient did have he is currently being followed by Dr. [name] seed implants.  Hyponatremia --secondary fluid overload, possibly low solute intake --anticipate some improvement with diuresis  Anemia of chronic disease --no bleeding reported.  --check anemia panel  Goals of care -Palliative medicine is following -Advance care planning, including the explanation and discussion of advance directives was carried out with the patient and family.  Code status including explanations of "Full Code" and "DNR" and alternatives were discussed in  detail.  Discussion of end-of-life issues including but not limited palliative  care, hospice care and the concept of hospice, other end-of-life care options, power of attorney for health care decisions, living wills, and physician orders for life-sustaining treatment were also discussed with the patient and family.  Total face to face time 16 minutes. -Overall prognosis remains poor -Patient states that he wants continued medical treatment including antibiotics and diuresis for now    Status is: Inpatient  Remains inpatient appropriate because:IV treatments appropriate due to intensity of illness or inability to take PO   Dispo: The patient is from: Home  Anticipated d/c is to: Home  Anticipated d/c date is: 2 days  Patient currently is not medically stable to d/c.        Family Communication:   No family at bedside  Consultants:  Palliative medicine  Code Status:   DNR  DVT Prophylaxis:   Heparin    Procedures: As Listed in Progress Note Above  Antibiotics: Vanco 11/17>> Cefepime 11/17>>     Subjective: Patient is breathing a little better.  Denies f/c, n/v/d.  Complains of intermittent chest discomfort  Objective: Vitals:   04/15/20 1919 04/15/20 2140 04/16/20 0132 04/16/20 0612  BP:  102/73  112/69  Pulse:  98  (!) 101  Resp:  20  20  Temp:  98.5 F (36.9 C)  97.7 F (36.5 C)  TempSrc:  Axillary    SpO2: 97% 99% 94% 99%  Weight:      Height:        Intake/Output Summary (Last 24 hours) at 04/16/2020 1034 Last data filed at 04/15/2020 1700 Gross per 24 hour  Intake 240 ml  Output --  Net 240 ml   Weight change:  Exam:   General:  Pt is alert, follows commands appropriately, not in acute distress  HEENT: No icterus, No thrush, No neck mass, [place]/AT  Cardiovascular: RRR, S1/S2, no rubs, no gallops  Respiratory: bibasilar crackles. No wheeze  Abdomen: Soft/+BS, non tender, non  distended, no guarding  Extremities: 1+LE edema, No lymphangitis, No petechiae, No rashes, no synovitis   Data Reviewed: I have personally reviewed following labs and imaging studies Basic Metabolic Panel: Recent Labs  Lab 04/13/20 2148 04/16/20 0450  NA 130* 131*  K 4.1 4.4  CL 85* 86*  CO2 36* 34*  GLUCOSE 131* 184*  BUN 30* 41*  CREATININE 0.97 1.08  CALCIUM 8.8* 9.3   Liver Function Tests: Recent Labs  Lab 04/13/20 2148  AST 11*  ALT 12  ALKPHOS 69  BILITOT 0.4  PROT 7.3  ALBUMIN 2.6*   No results for input(s): LIPASE, AMYLASE in the last 168 hours. No results for input(s): AMMONIA in the last 168 hours. Coagulation Profile: No results for input(s): INR, PROTIME in the last 168 hours. CBC: Recent Labs  Lab 04/13/20 2148 04/16/20 0450  WBC 10.8* 10.5  NEUTROABS 8.1*  --   HGB 7.4* 7.1*  HCT 23.4* 23.6*  MCV 93.2 95.2  PLT 361 440*   Cardiac Enzymes: No results for input(s): CKTOTAL, CKMB, CKMBINDEX, TROPONINI in the last 168 hours. BNP: Invalid input(s): POCBNP CBG: No results for input(s): GLUCAP in the last 168 hours. HbA1C: No results for input(s): HGBA1C in the last 72 hours. Urine analysis:    Component Value Date/Time   COLORURINE YELLOW 04/14/2020 [place] 04/14/2020 0450   LABSPEC 1.013 04/14/2020 0450   PHURINE 5.0 04/14/2020 [place] 04/14/2020 0450   HGBUR NEGATIVE 04/14/2020 [place] NEGATIVE 04/14/2020 0450  KETONESUR NEGATIVE 04/14/2020 0450   PROTEINUR NEGATIVE 04/14/2020 0450   UROBILINOGEN 0.2 08/06/2013 1436   NITRITE NEGATIVE 04/14/2020 0450   LEUKOCYTESUR NEGATIVE 04/14/2020 0450   Sepsis Labs: @LABRCNTIP (procalcitonin:4,lacticidven:4) ) Recent Results (from the past 240 hour(s))  SARS Coronavirus 2 by RT PCR (hospital order, performed in [facility] hospital lab) Nasopharyngeal Nasopharyngeal Swab     Status: None   Collection Time: 04/09/20 10:08 AM   Specimen:  Nasopharyngeal Swab  Result Value Ref Range Status   SARS Coronavirus 2 NEGATIVE NEGATIVE Final    Comment: (NOTE) SARS-CoV-2 target nucleic acids are NOT DETECTED.  The SARS-CoV-2 RNA is generally detectable in upper and lower respiratory specimens during the acute phase of infection. The lowest concentration of SARS-CoV-2 viral copies this assay can detect is 250 copies / mL. A negative result does not preclude SARS-CoV-2 infection and should not be used as the sole basis for treatment or other patient management decisions.  A negative result may occur with improper specimen collection / handling, submission of specimen other than nasopharyngeal swab, presence of viral mutation(s) within the areas targeted by this assay, and inadequate number of viral copies (<250 copies / mL). A negative result must be combined with clinical observations, patient history, and epidemiological information.  Fact Sheet for Patients:   StrictlyIdeas.no  Fact Sheet for Healthcare Providers: BankingDealers.co.za  This test is not yet approved or  cleared by the [place] FDA and has been authorized for detection and/or diagnosis of SARS-CoV-2 by FDA under an Emergency Use Authorization (EUA).  This EUA will remain in effect (meaning this test can be used) for the duration of the COVID-19 declaration under Section 564(b)(1) of the Act, 21 U.S.C. section 360bbb-3(b)(1), unless the authorization is terminated or revoked sooner.  Performed at [facility], [place] [address], [place], [place] [address]   Resp Panel by RT PCR (RSV, Flu A&B, Covid) - Nasopharyngeal Swab     Status: None   Collection Time: 04/13/20  9:48 PM   Specimen: Nasopharyngeal Swab  Result Value Ref Range Status   SARS Coronavirus 2 by RT PCR NEGATIVE NEGATIVE Final    Comment: (NOTE) SARS-CoV-2 target nucleic acids are NOT DETECTED.  The SARS-CoV-2 RNA is generally  detectable in upper respiratoy specimens during the acute phase of infection. The lowest concentration of SARS-CoV-2 viral copies this assay can detect is 131 copies/mL. A negative result does not preclude SARS-Cov-2 infection and should not be used as the sole basis for treatment or other patient management decisions. A negative result may occur with  improper specimen collection/handling, submission of specimen other than nasopharyngeal swab, presence of viral mutation(s) within the areas targeted by this assay, and inadequate number of viral copies (<131 copies/mL). A negative result must be combined with clinical observations, patient history, and epidemiological information. The expected result is Negative.  Fact Sheet for Patients:  PinkCheek.be  Fact Sheet for Healthcare Providers:  GravelBags.it  This test is no t yet approved or cleared by the [place] FDA and  has been authorized for detection and/or diagnosis of SARS-CoV-2 by FDA under an Emergency Use Authorization (EUA). This EUA will remain  in effect (meaning this test can be used) for the duration of the COVID-19 declaration under Section 564(b)(1) of the Act, 21 U.S.C. section 360bbb-3(b)(1), unless the authorization is terminated or revoked sooner.     Influenza A by PCR NEGATIVE NEGATIVE Final   Influenza B by PCR NEGATIVE NEGATIVE Final  Comment: (NOTE) The Xpert Xpress SARS-CoV-2/FLU/RSV assay is intended as an aid in  the diagnosis of influenza from Nasopharyngeal swab specimens and  should not be used as a sole basis for treatment. Nasal washings and  aspirates are unacceptable for Xpert Xpress SARS-CoV-2/FLU/RSV  testing.  Fact Sheet for Patients: PinkCheek.be  Fact Sheet for Healthcare Providers: GravelBags.it  This test is not yet approved or cleared by the [place] FDA and   has been authorized for detection and/or diagnosis of SARS-CoV-2 by  FDA under an Emergency Use Authorization (EUA). This EUA will remain  in effect (meaning this test can be used) for the duration of the  Covid-19 declaration under Section 564(b)(1) of the Act, 21  U.S.C. section 360bbb-3(b)(1), unless the authorization is  terminated or revoked.    Respiratory Syncytial Virus by PCR NEGATIVE NEGATIVE Final    Comment: (NOTE) Fact Sheet for Patients: PinkCheek.be  Fact Sheet for Healthcare Providers: GravelBags.it  This test is not yet approved or cleared by the [place] FDA and  has been authorized for detection and/or diagnosis of SARS-CoV-2 by  FDA under an Emergency Use Authorization (EUA). This EUA will remain  in effect (meaning this test can be used) for the duration of the  COVID-19 declaration under Section 564(b)(1) of the Act, 21 U.S.C.  section 360bbb-3(b)(1), unless the authorization is terminated or  revoked. Performed at [facility], [address], [place], [place] [address]      Scheduled Meds:  budesonide (PULMICORT) nebulizer solution  0.5 mg Nebulization BID   feeding supplement  237 mL Oral BID BM   folic acid  1 mg Oral Daily   furosemide  80 mg Intravenous BID   heparin  5,000 Units Subcutaneous Q8H   ipratropium-albuterol  3 mL Nebulization Q6H   methylPREDNISolone (SOLU-MEDROL) injection  60 mg Intravenous Q12H   metoprolol succinate  25 mg Oral Daily   Continuous Infusions:  ceFEPime (MAXIPIME) IV 2 g (04/16/20 0739)   vancomycin      Procedures/Studies: DG Chest 1 View  Result Date: 04/06/2020 CLINICAL DATA:  59 year old male with shortness of breath. EXAM: CHEST  1 VIEW COMPARISON:  Chest radiograph dated 04/04/2020. FINDINGS: Right-sided Port-A-Cath with tip at the cavoatrial junction. Diffuse chronic interstitial coarsening as seen on the prior radiograph. No new  consolidation. No large pleural effusion or pneumothorax. Thickening of the left upper lobe pleural surface. Left upper lobe cavitary lesion with peripheral air. There is stable cardiomegaly. Left pectoral AICD device. Median sternotomy wires and coronary stent. No acute osseous pathology. IMPRESSION: 1. No new consolidation. Overall no significant interval change in the appearance of the lungs compared to the prior radiograph. 2. Left upper lobe cavitary lesion with peripheral air. 3. Stable cardiomegaly. Electronically Signed   By: Anner Crete M.D.   On: 04/06/2020 22:45   DG Chest 1 View  Result Date: 03/29/2020 CLINICAL DATA:  Short of breath.  History of lung carcinoma. EXAM: CHEST  1 VIEW COMPARISON:  03/18/2020 and earlier exams. FINDINGS: Stable changes from previous cardiac surgery. Left coronary artery stents. Masslike opacity superimposed over the right hilum is stable. No convincing mediastinal masses. Left hemithorax volume loss with mediastinal shift to the left. There is consolidation in the left lung most apparent in the upper lung, as well as linear/reticular type opacities, stable from the prior study. On the right, patchy airspace opacity is noted in the mid and lower lung superimposed on chronic interstitial thickening. The lung is hyperexpanded. No  pneumothorax. Stable left anterior chest wall AICD and right anterior chest wall Port-A-Cath. IMPRESSION: 1. New patchy airspace opacity in the right mid to lower lung superimposed on chronic interstitial thickening. New lung opacities are suspicious for superimposed pneumonia. 2. No other change from the prior exam. Extensive chronic changes as detailed. Electronically Signed   By: Lajean Manes M.D.   On: 03/29/2020 08:39   CT ANGIO CHEST PE W OR WO CONTRAST  Result Date: 03/30/2020 CLINICAL DATA:  Pulmonary fibrosis, lung cancer, dyspnea EXAM: CT ANGIOGRAPHY CHEST WITH CONTRAST TECHNIQUE: Multidetector CT imaging of the chest was  performed using the standard protocol during bolus administration of intravenous contrast. Multiplanar CT image reconstructions and MIPs were obtained to evaluate the vascular anatomy. CONTRAST:  160mL OMNIPAQUE IOHEXOL 350 MG/ML SOLN COMPARISON:  01/06/2020 FINDINGS: Cardiovascular: There is excellent opacification of the pulmonary arterial tree. There is no intraluminal filling defect identified to suggest acute pulmonary embolism. The central pulmonary arteries are enlarged in keeping with changes of pulmonary arterial hypertension. Coronary artery bypass grafting has been performed with stenting within a left anterior descending saphenous vein graft. There is mild global cardiomegaly with moderate left ventricular dilation and superimposed mild left ventricular hypertrophy. No pericardial effusion. Left subclavian single lead pacemaker is seen with its lead within the right ventricle. Right internal jugular chest port tip is seen within the right atrium. The thoracic aorta demonstrates mild atherosclerotic calcification, but is otherwise unremarkable. Mediastinum/Nodes: Thyroid unremarkable. Pathologic pre-vascular mediastinal adenopathy is again identified exact measurement is difficult due to streak artifact from overlying pacemaker, however, the index lymph node demonstrates progressive enlargement, measuring 2.0 x 2.8 cm at axial image # 47/5. Right hilar adenopathy has progressed with the index lymph node within the right hilum measuring 1.7 x 2.1 cm at axial image # 63/5. thyroid unremarkable. Esophagus unremarkable. Lungs/Pleura: Severe, end-stage cystic lung disease within the left apex with associated left-sided volume loss is again identified with probable 5 cm aspergillosis a again identified at the left apex. Moderate severe centrilobular paraseptal emphysema. There is interval development of diffuse ground-glass pulmonary infiltrate throughout the residual pulmonary parenchyma and new consolidation  at the right lung base subpleurally, likely infectious in the appropriate clinical setting. Alternatively, diffuse infiltrate could relate to pulmonary edema with consolidation at the right lung base representing superimposed infection should or changes of radiation pneumonitis in the appropriate clinical setting. Pleural base mass at the right lung base demonstrates interval increase in size measuring 3.7 x 4.4 cm at axial image # 99/11 Upper Abdomen: Extensive reflux of contrast into the hepatic venous system secondary to at least some degree of right heart failure. No acute abnormality. Musculoskeletal: No acute bone abnormality. Review of the MIP images confirms the above findings. IMPRESSION: Interval development of diffuse ground-glass pulmonary infiltrate and right basilar subpleural consolidation. This may relate to diffuse infection or, more likely, diffuse pulmonary edema with superimposed infectious or post radiation change at the right lung base. 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 Superimposed moderate emphysema. Interval disease progression with enlargement of the neoplastic mass at the right lung base and developing right hilar adenopathy. Progressive pre-vascular adenopathy of unclear etiology. Moderate left ventricular dilation. Reflux of contrast into the hepatic venous vasculature in keeping with at least some degree of right heart failure. No pulmonary embolism. Aortic Atherosclerosis (ICD10-I70.0) and Emphysema (ICD10-J43.9). Electronically Signed   By: Fidela Salisbury MD   On: 03/30/2020 22:33   DG Chest Port 1 [address]  Result Date: 04/13/2020 CLINICAL DATA:  Dyspnea, lung cancer, pneumonia EXAM: PORTABLE CHEST 1 VIEW COMPARISON:  Multiple including 04/06/2020, 04/04/2020, and 03/18/2020 FINDINGS: Left-sided volume loss with multiple cystic lucencies are again identified at the left apex with superimposed consolidation at the left apex, likely  corresponding to the aspirate the lung a noted on prior CT examination of 03/30/2020. Progressive coarse interstitial pulmonary infiltrate throughout the right lung, likely infectious in etiology. Tiny right pleural effusion now present. No pneumothorax. Medial right basilar spiculated mass is better visualized than on prior examination, likely corresponding to the primary malignancy. Right internal jugular chest port tip is unchanged within the superior cavoatrial junction. Moderate cardiomegaly with stenting of a saphenous vein bypass graft is again noted. Left subclavian single lead pacemaker defibrillator is unchanged. Median sternotomy has been performed. IMPRESSION: Progressive coarse interstitial infiltrate throughout the right lung most in keeping with progressive pneumonic infiltrate. 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 Stable spiculated neoplastic mass at the medial right lung base. Unchanged cardiomegaly. Electronically Signed   By: Fidela Salisbury MD   On: 04/13/2020 22:23   DG CHEST PORT 1 VIEW  Result Date: 04/04/2020 CLINICAL DATA:  Dyspnea, follow-up lung opacities, history of right lung cancer EXAM: PORTABLE CHEST 1 VIEW COMPARISON:  03/29/2020 chest radiograph. FINDINGS: Right internal jugular Port-A-Cath terminates over the cavoatrial junction. Stable configuration of single lead left subclavian ICD. Intact median sternotomy wires. Stable cardiomediastinal silhouette with mild cardiomegaly. No pneumothorax. Chronic bilateral costophrenic angle blunting. Chronic dense consolidation, volume loss and distortion in upper left lung with associated bronchiectasis and chronic mycetoma as seen on prior chest CT. Patchy opacity throughout both lungs, slightly worsened in the upper right lung and improved at the right lung base. Known spiculated right retrocardiac mass. IMPRESSION: 1. Patchy opacity throughout  both lungs, slightly worsened in the upper right lung and improved at the right lung base, favor multifocal pneumonia, with a component of pulmonary edema not excluded. 2. Known spiculated right retrocardiac lung mass. 3. Chronic dense consolidation, volume loss and distortion in the upper left lung with associated bronchiectasis and chronic mycetoma as seen on prior chest CT. Electronically Signed   By: Ilona Sorrel M.D.   On: 04/04/2020 15:58   DG Chest Portable 1 View  Result Date: 03/18/2020 CLINICAL DATA:  Shortness of breath, lung cancer.  Swelling in legs. EXAM: PORTABLE CHEST 1 VIEW COMPARISON:  Chest radiograph 03/05/2019.  Chest CT 01/06/2020 FINDINGS: Similar opacification left lung apex with interspersed lucencies, characterized as bronchiectasis, chronic lung disease, and probable large aspirate alone a on prior CT. Right lower lobe mass was better characterized on recent CT. There is new mild diffuse interstitial prominence, suggestive of mild interstitial edema. Pulmonary vascular congestion. Suspected small bilateral pleural effusions. No discernible pneumothorax. Right Port-A-Cath with the tip projecting at the cavoatrial junction. Left subclavian approach cardiac rhythm maintenance device. IMPRESSION: 1. New mild diffuse interstitial prominence, pulmonary vascular congestion, and suspected small bilateral pleural effusions, possibly related to mild pulmonary edema. Atypical pneumonia is a consideration. 2. Right lower lobe mass was better characterized on recent CT. 3. Similar chronic changes in the left lung apex, as detailed above. Electronically Signed   By: Margaretha Sheffield MD   On: 03/18/2020 09:38   ECHOCARDIOGRAM COMPLETE  Result Date: 03/18/2020    ECHOCARDIOGRAM REPORT   Patient Name:   [facility] [place] Date of Exam: 03/18/2020 Medical Rec #:  [id number]  Height:       68.0 in Accession #:    [id number]    Weight:       140.5 lb Date of Birth:  [birth year]     BSA:          1.759  m Patient Age:    73 years      BP:           132/76 mmHg Patient Gender: M             HR:           115 bpm. Exam Location:  Inpatient Procedure: 2D Echo Indications:    CHF 428  History:        Patient has prior history of Echocardiogram examinations, most                 recent 03/01/2018. CHF and Cardiomyopathy, CAD and Previous                 Myocardial Infarction, Defibrillator and Prior CABG, COPD; Risk                 Factors:Dyslipidemia and Former Smoker. AICD. STEMI.  Sonographer:    Jannett Celestine RDCS (AE) Referring Phys: [id number] Michell Heinrich PAHWANI IMPRESSIONS  1. Left ventricular ejection fraction, by estimation, is <20%. The left ventricle has severely decreased function. The left ventricle demonstrates regional wall motion abnormalities (see scoring diagram/findings for description). The left ventricular internal cavity size was moderately to severely dilated. There is mild concentric left ventricular hypertrophy. Left ventricular diastolic function could not be evaluated. There is diffuse severe hypokinesis with akinesis of the distal to apical segments  of all walls.  2. Right ventricular systolic function was not well visualized. The right ventricular size is not well visualized.  3. The mitral valve is normal in structure. Mild to moderate mitral valve regurgitation.  4. The aortic valve is grossly normal. Aortic valve regurgitation is mild. No aortic stenosis is present. Comparison(s): Prior images unable to be directly viewed, comparison made by report only. Conclusion(s)/Recommendation(s): Severely reduced LVEF with both global and focal wall motion abnormalities. FINDINGS  Left Ventricle: LV apical thrombus excluded by contrast. Left ventricular ejection fraction, by estimation, is <20%. The left ventricle has severely decreased function. The left ventricle demonstrates regional wall motion abnormalities. The left ventricular internal cavity size was moderately to severely dilated. There is  mild concentric left ventricular hypertrophy. Left ventricular diastolic function could not be evaluated.  LV Wall Scoring: There is diffuse severe hypokinesis with akinesis of the distal to apical segments of all walls. Right Ventricle: The right ventricular size is not well visualized. Right vetricular wall thickness was not assessed. Right ventricular systolic function was not well visualized. Left Atrium: Left atrial size was not assessed. Right Atrium: Right atrial size was not assessed. Pericardium: There is no evidence of pericardial effusion. Mitral Valve: The mitral valve is normal in structure. Mild to moderate mitral valve regurgitation. Tricuspid Valve: The tricuspid valve is normal in structure. Tricuspid valve regurgitation is trivial. No evidence of tricuspid stenosis. Aortic Valve: The aortic valve is grossly normal. Aortic valve regurgitation is mild. No aortic stenosis is present. Pulmonic Valve: The pulmonic valve was grossly normal. Pulmonic valve regurgitation is not visualized. Aorta: The aortic root is normal in size and structure. Venous: IVC assessment for right atrial pressure unable to be performed due to mechanical ventilation. IAS/Shunts: The interatrial septum was not assessed. Additional Comments: A pacer wire is  visualized in the right atrium and right ventricle.  LEFT VENTRICLE PLAX 2D LVIDd:         6.20 cm LVIDs:         5.20 cm LV PW:         1.20 cm LV IVS:        1.10 cm LVOT diam:     2.10 cm LV SV:         30 LV SV Index:   17 LVOT Area:     3.46 cm  LEFT ATRIUM         Index LA diam:    3.80 cm 2.16 cm/m  AORTIC VALVE LVOT Vmax:   59.70 cm/s LVOT Vmean:  44.200 cm/s LVOT VTI:    0.087 m  AORTA Ao Root diam: 3.30 cm MITRAL VALVE MV Area (PHT): 4.68 cm    SHUNTS MV Decel Time: 162 msec    Systemic VTI:  0.09 m MR Peak grad: 67.2 mmHg    Systemic Diam: 2.10 cm MR Mean grad: 41.0 mmHg MR Vmax:      410.00 cm/s MR Vmean:     300.0 cm/s MV E velocity: 85.10 cm/s MV A velocity:  60.60 cm/s MV E/A ratio:  1.40 Buford Dresser MD Electronically signed by Buford Dresser MD Signature Date/Time: 03/18/2020/8:41:25 PM    Final     Orson Eva, DO  [facility]  If 7PM-7AM, please contact night-coverage [email or web address] Password [email or web address] 04/16/2020, 10:34 AM   LOS: 2 days

## 2020-04-16 NOTE — Progress Notes (Signed)
Patient chart reviewed, followed by cardiology service during recent extended admission at [facility]. History of chronic systolic/diastolic HF with LVEF <76%, CAD with prior CABG in 1997 and subsequent PCIs, COPD, lung and prostate cancer. From 04/09/20 note patient with lung CA not a candidate for chemo secondary to severe deconditioning and HF. Palliative care was consulted due to advanced cancer and HF diagnosis and he was discharged with home hospice, ICD was deactivated at that time.   Admitted with significant hypoxia requiring 6L [place], anemia Hgb 7.4, SIRs criteria. Being treated for COPD exacerbation, pneumonia, HF. Admit BNP 2052 down from 4000 prior admit. Patient has asked for continued medical management as opposed to comfort care.   From cardiac standpoint severe advanced HF admit BNP 2052. Has not tolerated medical therapy due to low bp's. I/Os are incomplete. He is on IV lasix 80mg  bid, renal function is stable. He is on low dose toprol. Not a candidate for advanced therapies due to cancer diagnosis.   Unfortunately we do not have much to offer from a cardiac standpoint for the reasons stated above other than diuresis for improvement of symptoms. His long term prognosis is very poor and there is no cardiac intervention that will meaningfully improve that. Can continue IV lasix with goal net negative 2-3 L per day, if bp's are an issue can d/c his metoprolol. We will follow peripherally, call with questions. When converted to oral diuretic would change to torsemide 40mg  bid as opposed to lasix.     Carlyle Dolly MD

## 2020-04-16 NOTE — Progress Notes (Signed)
Pt's HR increased to 130s at 1435 with pain discomfort; all other vitals WNLs; pt gave PRN pain medication in hopes it would help with HR and pain; nurse reassessed pt after 30 mins and pt looked comfortable in bed however HR remained in the high 120s; Nurse notified MD at this time about event; shortly after nurse notified MD pt's HR went back down to baseline; orders obtained for chest CT.

## 2020-04-16 NOTE — Progress Notes (Signed)
Palliative: Mr. Eich is sitting up in the [place] chair in his room.  He appears acutely/chronically ill and quite frail.  He does not make or keep eye contact.  He is alert and oriented, able to make his needs known.  There is no family at bedside at this time.  Mr. Coronado tells me that he feels about the same today.  He asks that we pray for him.   At this point, continue to treat the treatable. The next 24/48 hours will tell if Mr. Greenough will have meaningful recovery.  At this point, he is still not ready for comfort care.    Plan:  Continue to treat the treatable, but no CPR or intubation.  Time for outcomes.   57 minutes Quinn Axe, NP Palliative Medicine Team Team Phone # [phone] Greater than 50% of this time was spent counseling and coordinating care related to the above assessment and plan.

## 2020-04-16 NOTE — Progress Notes (Signed)
Responded to nursing call:  tachycardia   Subjective: Patient denies fevers, chills, headache, chest pain, dyspnea, nausea, vomiting, diarrhea, abdominal pain,   Vitals:   04/16/20 1338 04/16/20 1439 04/16/20 1543 04/16/20 1557  BP:  124/82  118/74  Pulse:  (!) 132 98 98  Resp:  18  18  Temp:  (!) 97.5 F (36.4 C)    TempSrc:  Oral    SpO2: 97% 99%  92%  Weight:      Height:       CV--RRR, no rub Lung--bilateral rales Abd--soft+BS/NT   Assessment/Plan: SVT/Atrial tachycardia -short runs -personally reviewed tele -CTA chest -continue current dose metoprolol succinate -personally reviewed EKG--sinus, no concerning STT changes     Orson Eva, DO [facility]

## 2020-04-16 NOTE — Progress Notes (Signed)
Physical Therapy Treatment Patient Details Name: [name] MRN: [id number] DOB: July 15, 1960 Today's Date: 04/16/2020    History of Present Illness [name]  is a 59 y.o. male, with history of of childhood TB, MI, prostate cancer, lung cancer, COPD, CAD, CHF 2/2 ischemic cardiomyopathy and more presents to the ED with a chief complaint of dyspnea. Patient reports that it started at 6:00pm on 04/13/20. It was gradual in onset and associated with intermittent palpitations. Patient denies chest pain, nausea, diaphoresis, orthopnea, peripheral edema. Patient is not very forthcoming with details and reports that he is "about to die" and wishes that he would just "die and it would be over with." Patient was supposed to have hospice consult at SNF, but for unknown reasons that did not happen. Patient reports that when he started to feel short of breath, they turned his O2 up to 5L. When that did not relieve his symptoms he was sent to the ER.  Patient was recently admitted 10/20 - 11/12. He was supposed to be under hospice care at discharge. At that time he had been admitted for dyspnea x 2 weeks. At that time he had a twenty point weight gain associated with his dyspnea at that time. He was treated for acute on chronic combined systolic and diastolic heart failure, and at that time he required up to 15L [facility]. He was weaned down to 5L [place] per DC summary, but patient says he was actually able to get down to 4 at the SNF.    PT Comments    Patient demo improved participation with functional tasks today and able to increase mobility but requiring frequent rest periods and task segmentation due to poor activity tolerance.  Vitals monitored throughout and WNL and cues for pacing/pursed-lip breathing to decrease respiration rate.  Patient continues to demo generalized weakness, ADL/mobility deficits requiring physical assistance for safety and support.  Patient will benefit from continued physical therapy in  hospital and recommended venue below to increase strength, balance, endurance for safe ADLs and gait.   Follow Up Recommendations  SNF (pt demo impaired mobility, ADL, and decreased functional activity tolerance requiring caregiver assistance in all aspects)     Equipment Recommendations  Rolling walker with 5" wheels;3in1 (PT)    Recommendations for Other Services       Precautions / Restrictions Precautions Precautions: Fall Restrictions Weight Bearing Restrictions: No    Mobility  Bed Mobility Overal bed mobility: Independent                Transfers Overall transfer level: Needs assistance Equipment used: None Transfers: Sit to/from Stand;Stand Pivot Transfers Sit to Stand: Min guard Stand pivot transfers: Min guard       General transfer comment: unsteadiness due to generalized weakness and UE support on furniture for support  Ambulation/Gait Ambulation/Gait assistance: Min assist Gait Distance (Feet): 25 Feet Assistive device: Rolling walker (2 wheeled) Gait Pattern/deviations: Step-through pattern;Decreased stride length Gait velocity: decreased   General Gait Details: stop-start for frequent rest periods and external support for postural/AD control   Stairs             Wheelchair Mobility    Modified Rankin (Stroke Patients Only)       Balance                                            Cognition Arousal/Alertness: Awake/alert  Behavior During Therapy: Flat affect Overall Cognitive Status: Within Functional Limits for tasks assessed                                        Exercises      General Comments        Pertinent Vitals/Pain Pain Assessment: No/denies pain    Home Living                      Prior Function            PT Goals (current goals can now be found in the care plan section) Acute Rehab PT Goals Patient Stated Goal: Be able to return home with some  assistance PT Goal Formulation: With patient Time For Goal Achievement: 04/22/20 Potential to Achieve Goals: Good Progress towards PT goals: Progressing toward goals    Frequency    Min 3X/week      PT Plan Current plan remains appropriate    Co-evaluation              AM-PAC PT "6 Clicks" Mobility   Outcome Measure  Help needed turning from your back to your side while in a flat bed without using bedrails?: None Help needed moving from lying on your back to sitting on the side of a flat bed without using bedrails?: None Help needed moving to and from a bed to a chair (including a wheelchair)?: A Little Help needed standing up from a chair using your arms (e.g., wheelchair or bedside chair)?: A Little Help needed to walk in hospital room?: A Lot Help needed climbing 3-5 steps with a railing? : A Lot 6 Click Score: 18    End of Session Equipment Utilized During Treatment: Oxygen Activity Tolerance: Patient tolerated treatment well;Patient limited by fatigue Patient left: with call bell/phone within reach;in bed Nurse Communication: Mobility status PT Visit Diagnosis: Unsteadiness on feet (R26.81);Other abnormalities of gait and mobility (R26.89);Muscle weakness (generalized) (M62.81)     Time: 7124-5809 PT Time Calculation (min) (ACUTE ONLY): 45 min  Charges:  $Gait Training: 8-22 mins $Therapeutic Activity: 23-37 mins                     1:12 PM, 04/16/20 M. Sherlyn Lees, PT, DPT Physical Therapist- [place] Office Number: [phone]

## 2020-04-17 DIAGNOSIS — A419 Sepsis, unspecified organism: Principal | ICD-10-CM

## 2020-04-17 DIAGNOSIS — J441 Chronic obstructive pulmonary disease with (acute) exacerbation: Secondary | ICD-10-CM | POA: Diagnosis not present

## 2020-04-17 DIAGNOSIS — I5043 Acute on chronic combined systolic (congestive) and diastolic (congestive) heart failure: Secondary | ICD-10-CM | POA: Diagnosis not present

## 2020-04-17 DIAGNOSIS — C3491 Malignant neoplasm of unspecified part of right bronchus or lung: Secondary | ICD-10-CM | POA: Diagnosis not present

## 2020-04-17 LAB — CBC
HCT: 23.4 % — ABNORMAL LOW (ref 39.0–52.0)
Hemoglobin: 7.1 g/dL — ABNORMAL LOW (ref 13.0–17.0)
MCH: 29.2 pg (ref 26.0–34.0)
MCHC: 30.3 g/dL (ref 30.0–36.0)
MCV: 96.3 fL (ref 80.0–100.0)
Platelets: 448 10*3/uL — ABNORMAL HIGH (ref 150–400)
RBC: 2.43 MIL/uL — ABNORMAL LOW (ref 4.22–5.81)
RDW: 17.6 % — ABNORMAL HIGH (ref 11.5–15.5)
WBC: 13.9 10*3/uL — ABNORMAL HIGH (ref 4.0–10.5)
nRBC: 1.9 % — ABNORMAL HIGH (ref 0.0–0.2)

## 2020-04-17 LAB — COMPREHENSIVE METABOLIC PANEL
ALT: 12 U/L (ref 0–44)
AST: 13 U/L — ABNORMAL LOW (ref 15–41)
Albumin: 2.7 g/dL — ABNORMAL LOW (ref 3.5–5.0)
Alkaline Phosphatase: 98 U/L (ref 38–126)
Anion gap: 12 (ref 5–15)
BUN: 51 mg/dL — ABNORMAL HIGH (ref 6–20)
CO2: 32 mmol/L (ref 22–32)
Calcium: 9.8 mg/dL (ref 8.9–10.3)
Chloride: 87 mmol/L — ABNORMAL LOW (ref 98–111)
Creatinine, Ser: 1.49 mg/dL — ABNORMAL HIGH (ref 0.61–1.24)
GFR, Estimated: 54 mL/min — ABNORMAL LOW (ref >60)
Glucose, Bld: 200 mg/dL — ABNORMAL HIGH (ref 70–99)
Potassium: 4.7 mmol/L (ref 3.5–5.1)
Sodium: 131 mmol/L — ABNORMAL LOW (ref 135–145)
Total Bilirubin: 0.3 mg/dL (ref 0.3–1.2)
Total Protein: 7.7 g/dL (ref 6.5–8.1)

## 2020-04-17 LAB — IRON AND TIBC
Iron: 95 ug/dL (ref 45–182)
Saturation Ratios: 29 % (ref 17.9–39.5)
TIBC: 326 ug/dL (ref 250–450)
UIBC: 231 ug/dL

## 2020-04-17 LAB — FOLATE: Folate: 54.2 ng/mL (ref >5.9)

## 2020-04-17 LAB — VITAMIN B12: Vitamin B-12: 850 pg/mL (ref 180–914)

## 2020-04-17 LAB — FERRITIN: Ferritin: 1010 ng/mL — ABNORMAL HIGH (ref 24–336)

## 2020-04-17 MED ORDER — ARFORMOTEROL TARTRATE 15 MCG/2ML IN NEBU
15.0000 ug | INHALATION_SOLUTION | Freq: Two times a day (BID) | RESPIRATORY_TRACT | Status: DC
  Administered 2020-04-17 – 2020-04-20 (×6): 15 ug via RESPIRATORY_TRACT
  Filled 2020-04-17 (×6): qty 2

## 2020-04-17 MED ORDER — DIGOXIN 0.25 MG/ML IJ SOLN
0.2500 mg | Freq: Four times a day (QID) | INTRAMUSCULAR | Status: AC
  Administered 2020-04-17 (×3): 0.25 mg via INTRAVENOUS
  Filled 2020-04-17 (×3): qty 2

## 2020-04-17 MED ORDER — DIGOXIN 125 MCG PO TABS
0.1250 mg | ORAL_TABLET | Freq: Every day | ORAL | Status: DC
  Administered 2020-04-18 – 2020-04-20 (×3): 0.125 mg via ORAL
  Filled 2020-04-17 (×3): qty 1

## 2020-04-17 MED ORDER — SODIUM CHLORIDE 0.9 % IV SOLN
2.0000 g | Freq: Two times a day (BID) | INTRAVENOUS | Status: DC
  Administered 2020-04-17 – 2020-04-20 (×7): 2 g via INTRAVENOUS
  Filled 2020-04-17 (×6): qty 2

## 2020-04-17 NOTE — Progress Notes (Signed)
PROGRESS NOTE  [name] DUK:[id number] DOB: 08/17/1960 DOA: 04/13/2020 PCP: [name], MD  Brief History: 59 y.o.malepast medical history significant for coronary artery disease status post CABG, ischemic cardiomyopathy with an EF of 20% status post AICD placement, chronic hypoxic respiratory failure secondary to COPD on 4 L of oxygen, stage Ia non-small cell cancer diagnosed in February 2015 status post right lower lobe wedge resection as well as SBRT with a recurrent disease to the right lower lobe in June 2019 which has been in observation since then. Stage T1 adenocarcinoma of the prostate with a Gleason score of 4 diagnosed in November 2019 with a confirmed PET scan and biopsy of the right lower mass consistent with adenocarcinoma currently on chemotherapy, chemotherapy-induced anemia status post transfusion. Patient presented secondary to dyspnea.Patient was recently hospitalized from 03/18/2020 to 04/10/2020 during which she had acute on chronic respiratory failure secondary to pneumonia as well as acute on chronic systolic and diastolic CHF. The patient was diuresed with intravenous furosemide and treated with antibiotics. He was stabilized and subsequently discharged to skilled nursing facility with hospice care. He was seen by palliative medicine at that time and felt that he was a poor candidate to resume any chemotherapy. Subsequently, the patient's AICD was deactivated on 04/04/2020. The patient was discharged to [facility] with palliative services. Unfortunately, the patient represented back to the emergency department on 04/13/2020 secondary to worsening shortness of breath with increasing oxygen requirement. Chest x-ray showed progressive coarsening of his interstitial opacities with stable left-sided volume loss and consolidation in the left apex. Initially, the patient voiced that he did not want any antibiotics or aggressive therapy. However after further  discussions with palliative medicine as well as the medicine team, the patient decided that he wanted to continue therapy at this time.  Assessment/Plan: Acute on chronic respiratory failure with hypoxia -Patient was discharged to SNF on 5 L. -Currently on 7 L nasal cannula>>6L -Secondary to pulmonary edema, COPD exacerbation, pneumonia, and possibly radiation pneumonitis and lymphangitic spread of his lung cancer. -11/18--CTA chest--no PE; LUL consolidatative changes with a large cavitary lesion with intracavitary debris  Or fungal ball, RLL subpleural mass extending into hilum with a new spiculated mass in RLL since prior CT, C7 lytic lesion  Lobar pneumonia -Conitnue cefepime -MRSA screen--neg -d/c vanc  Acute on chronic combined systolic and diastolic CHF -37/62/8315 echo EF<20%, moderate to severe dilated LV, severe diffuse HK -Restarted furosemide 80 mg IV twice daily -Continue metoprolol succinate for now -Entresto and carvedilol--could not tolerate previously due to hypotension -cardiology input appreciated--> long term prognosis is very poor and there is no cardiac intervention that will meaningfully improve that. Can continue IV lasix;   When converted to oral diuretic would change to torsemide 40mg  bid  COPD exacerbation -continue Pulmicort -continue duo nebs -continue IV Solu-Medrol -add brovana  SVT -having intermittent SVT -BP will not likely tolerate titration of metoprolol -add digoxin  AKI -hold IV lasix today -repeat BMP -possible start torsemide in am  Adenocarcinoma of the right lung Progressive disease. Not a candidate for chemotherapy secondary to severe deconditioning and heart failure (03/16/20 note). Palliative care consulted -Patient is followed by Dr. [name] and had been receiving systemic chemotherapy every 3 weeks  Sepsis --present on admission -due to pneumonia --initiallypatient didnot want abx or intervention --now wants work up  and abx --PCT 16.26  Prostate adenocarcinoma -Diagnosed in November 2019. Patient did have he is currently  being followed by Dr. [name] seed implants.  Hyponatremia --secondaryfluid overload, possibly low solute intake --anticipate some improvement with diuresis  Anemia of chronic disease --no bleeding reported.  --Y07 371 --folic 06.2 --iron saturation 29/ferritin 1010  Goals of care -Palliative medicine is following -Advance care planning, including the explanation and discussion of advance directives was carried out with the patient and family. Code status including explanations of "Full Code" and "DNR" and alternatives were discussed in detail. Discussion of end-of-life issues including but not limited palliative care, hospice care and the concept of hospice, other end-of-life care options, power of attorney for health care decisions, living wills, and physician orders for life-sustaining treatment were also discussed with the patient and family. Total face to face time 16 minutes. -Overall prognosis remains poor -Patient states that he wants continued medical treatment including antibiotics and diuresis for now    Status is: Inpatient  Remains inpatient appropriate because:IV treatments appropriate due to intensity of illness or inability to take PO   Dispo: The patient is from:Home Anticipated d/c is IR:[facility] Anticipated d/c date is: 2 days Patient currently is not medically stable to d/c.        Family Communication:mother updated 11/19  Consultants:Palliative medicine  Code Status: DNR  DVT Prophylaxis: [place] Heparin    Procedures: As Listed in Progress Note Above  Antibiotics: Vanco 11/17>>11/19 Cefepime 11/17>>    Subjective: Patient denies fevers, chills, headache, chest pain, dyspnea, nausea, vomiting, diarrhea, abdominal pain, dysuria, hematuria, hematochezia, and  melena. Pain is better controlled with dilaudid  Objective: Vitals:   04/17/20 0733 04/17/20 0738 04/17/20 1409 04/17/20 1458  BP:    114/78  Pulse:    (!) 114  Resp:    19  Temp:    97.8 F (36.6 C)  TempSrc:      SpO2: 96% 96% 91% 91%  Weight:      Height:        Intake/Output Summary (Last 24 hours) at 04/17/2020 1654 Last data filed at 04/17/2020 1459 Gross per 24 hour  Intake 550 ml  Output 850 ml  Net -300 ml   Weight change:  Exam:   General:  Pt is alert, follows commands appropriately, not in acute distress  HEENT: No icterus, No thrush, No neck mass, [place]/AT  Cardiovascular: RRR, S1/S2, no rubs, no gallops  Respiratory: bibasilar crackles.  Bibasilar wheeze  Abdomen: Soft/+BS, non tender, non distended, no guarding  Extremities: 1+LE edema, No lymphangitis, No petechiae, No rashes, no synovitis   Data Reviewed: I have personally reviewed following labs and imaging studies Basic Metabolic Panel: Recent Labs  Lab 04/13/20 2148 04/16/20 0450 04/17/20 0505  NA 130* 131* 131*  K 4.1 4.4 4.7  CL 85* 86* 87*  CO2 36* 34* 32  GLUCOSE 131* 184* 200*  BUN 30* 41* 51*  CREATININE 0.97 1.08 1.49*  CALCIUM 8.8* 9.3 9.8   Liver Function Tests: Recent Labs  Lab 04/13/20 2148 04/17/20 0505  AST 11* 13*  ALT 12 12  ALKPHOS 69 98  BILITOT 0.4 0.3  PROT 7.3 7.7  ALBUMIN 2.6* 2.7*   No results for input(s): LIPASE, AMYLASE in the last 168 hours. No results for input(s): AMMONIA in the last 168 hours. Coagulation Profile: No results for input(s): INR, PROTIME in the last 168 hours. CBC: Recent Labs  Lab 04/13/20 2148 04/16/20 0450 04/17/20 0505  WBC 10.8* 10.5 13.9*  NEUTROABS 8.1*  --   --   HGB 7.4* 7.1* 7.1*  HCT 23.4*  23.6* 23.4*  MCV 93.2 95.2 96.3  PLT 361 440* 448*   Cardiac Enzymes: No results for input(s): CKTOTAL, CKMB, CKMBINDEX, TROPONINI in the last 168 hours. BNP: Invalid input(s): POCBNP CBG: No results for input(s): GLUCAP  in the last 168 hours. HbA1C: No results for input(s): HGBA1C in the last 72 hours. Urine analysis:    Component Value Date/Time   COLORURINE YELLOW 04/14/2020 [place] 04/14/2020 0450   LABSPEC 1.013 04/14/2020 0450   PHURINE 5.0 04/14/2020 0450   GLUCOSEU NEGATIVE 04/14/2020 0450   HGBUR NEGATIVE 04/14/2020 0450   BILIRUBINUR NEGATIVE 04/14/2020 0450   KETONESUR NEGATIVE 04/14/2020 0450   PROTEINUR NEGATIVE 04/14/2020 0450   UROBILINOGEN 0.2 08/06/2013 1436   NITRITE NEGATIVE 04/14/2020 0450   LEUKOCYTESUR NEGATIVE 04/14/2020 0450   Sepsis Labs: @LABRCNTIP (procalcitonin:4,lacticidven:4) ) Recent Results (from the past 240 hour(s))  SARS Coronavirus 2 by RT PCR (hospital order, performed in [facility] hospital lab) Nasopharyngeal Nasopharyngeal Swab     Status: None   Collection Time: 04/09/20 10:08 AM   Specimen: Nasopharyngeal Swab  Result Value Ref Range Status   SARS Coronavirus 2 NEGATIVE NEGATIVE Final    Comment: (NOTE) SARS-CoV-2 target nucleic acids are NOT DETECTED.  The SARS-CoV-2 RNA is generally detectable in upper and lower respiratory specimens during the acute phase of infection. The lowest concentration of SARS-CoV-2 viral copies this assay can detect is 250 copies / mL. A negative result does not preclude SARS-CoV-2 infection and should not be used as the sole basis for treatment or other patient management decisions.  A negative result may occur with improper specimen collection / handling, submission of specimen other than nasopharyngeal swab, presence of viral mutation(s) within the areas targeted by this assay, and inadequate number of viral copies (<250 copies / mL). A negative result must be combined with clinical observations, patient history, and epidemiological information.  Fact Sheet for Patients:   StrictlyIdeas.no  Fact Sheet for Healthcare  Providers: BankingDealers.co.za  This test is not yet approved or  cleared by the [place] FDA and has been authorized for detection and/or diagnosis of SARS-CoV-2 by FDA under an Emergency Use Authorization (EUA).  This EUA will remain in effect (meaning this test can be used) for the duration of the COVID-19 declaration under Section 564(b)(1) of the Act, 21 U.S.C. section 360bbb-3(b)(1), unless the authorization is terminated or revoked sooner.  Performed at [facility], [place] [address], [place], [place] [address]   Resp Panel by RT PCR (RSV, Flu A&B, Covid) - Nasopharyngeal Swab     Status: None   Collection Time: 04/13/20  9:48 PM   Specimen: Nasopharyngeal Swab  Result Value Ref Range Status   SARS Coronavirus 2 by RT PCR NEGATIVE NEGATIVE Final    Comment: (NOTE) SARS-CoV-2 target nucleic acids are NOT DETECTED.  The SARS-CoV-2 RNA is generally detectable in upper respiratoy specimens during the acute phase of infection. The lowest concentration of SARS-CoV-2 viral copies this assay can detect is 131 copies/mL. A negative result does not preclude SARS-Cov-2 infection and should not be used as the sole basis for treatment or other patient management decisions. A negative result may occur with  improper specimen collection/handling, submission of specimen other than nasopharyngeal swab, presence of viral mutation(s) within the areas targeted by this assay, and inadequate number of viral copies (<131 copies/mL). A negative result must be combined with clinical observations, patient history, and epidemiological information. The expected result is Negative.  Fact Sheet  for Patients:  PinkCheek.be  Fact Sheet for Healthcare Providers:  GravelBags.it  This test is no t yet approved or cleared by the [place] FDA and  has been authorized for detection and/or diagnosis of SARS-CoV-2  by FDA under an Emergency Use Authorization (EUA). This EUA will remain  in effect (meaning this test can be used) for the duration of the COVID-19 declaration under Section 564(b)(1) of the Act, 21 U.S.C. section 360bbb-3(b)(1), unless the authorization is terminated or revoked sooner.     Influenza A by PCR NEGATIVE NEGATIVE Final   Influenza B by PCR NEGATIVE NEGATIVE Final    Comment: (NOTE) The Xpert Xpress SARS-CoV-2/FLU/RSV assay is intended as an aid in  the diagnosis of influenza from Nasopharyngeal swab specimens and  should not be used as a sole basis for treatment. Nasal washings and  aspirates are unacceptable for Xpert Xpress SARS-CoV-2/FLU/RSV  testing.  Fact Sheet for Patients: PinkCheek.be  Fact Sheet for Healthcare Providers: GravelBags.it  This test is not yet approved or cleared by the [place] FDA and  has been authorized for detection and/or diagnosis of SARS-CoV-2 by  FDA under an Emergency Use Authorization (EUA). This EUA will remain  in effect (meaning this test can be used) for the duration of the  Covid-19 declaration under Section 564(b)(1) of the Act, 21  U.S.C. section 360bbb-3(b)(1), unless the authorization is  terminated or revoked.    Respiratory Syncytial Virus by PCR NEGATIVE NEGATIVE Final    Comment: (NOTE) Fact Sheet for Patients: PinkCheek.be  Fact Sheet for Healthcare Providers: GravelBags.it  This test is not yet approved or cleared by the [place] FDA and  has been authorized for detection and/or diagnosis of SARS-CoV-2 by  FDA under an Emergency Use Authorization (EUA). This EUA will remain  in effect (meaning this test can be used) for the duration of the  COVID-19 declaration under Section 564(b)(1) of the Act, 21 U.S.C.  section 360bbb-3(b)(1), unless the authorization is terminated or   revoked. Performed at [facility], [address] [place], [place] [address]   MRSA PCR Screening     Status: None   Collection Time: 04/16/20 11:31 AM   Specimen: Nasal Mucosa; Nasopharyngeal  Result Value Ref Range Status   MRSA by PCR NEGATIVE NEGATIVE Final    Comment:        The GeneXpert MRSA Assay (FDA approved for NASAL specimens only), is one component of a comprehensive MRSA colonization surveillance program. It is not intended to diagnose MRSA infection nor to guide or monitor treatment for MRSA infections. Performed at [facility], [address] [place], [place] [address]      Scheduled Meds: . budesonide (PULMICORT) nebulizer solution  0.5 mg Nebulization BID  . Chlorhexidine Gluconate Cloth  6 each Topical Daily  . digoxin  0.25 mg Intravenous Q6H  . [START ON 04/18/2020] digoxin  0.125 mg Oral Daily  . feeding supplement  237 mL Oral BID BM  . folic acid  1 mg Oral Daily  . heparin  5,000 Units Subcutaneous Q8H  . ipratropium-albuterol  3 mL Nebulization Q6H  . methylPREDNISolone (SOLU-MEDROL) injection  60 mg Intravenous Q12H  . metoprolol succinate  25 mg Oral Daily   Continuous Infusions: . ceFEPime (MAXIPIME) IV 2 g (04/17/20 0934)    Procedures/Studies: DG Chest 1 View  Result Date: 04/06/2020 CLINICAL DATA:  59 year old male with shortness of breath. EXAM: CHEST  1 VIEW COMPARISON:  Chest radiograph dated 04/04/2020. FINDINGS: Right-sided Port-A-Cath  with tip at the cavoatrial junction. Diffuse chronic interstitial coarsening as seen on the prior radiograph. No new consolidation. No large pleural effusion or pneumothorax. Thickening of the left upper lobe pleural surface. Left upper lobe cavitary lesion with peripheral air. There is stable cardiomegaly. Left pectoral AICD device. Median sternotomy wires and coronary stent. No acute osseous pathology. IMPRESSION: 1. No new consolidation. Overall no significant interval change in the appearance of the  lungs compared to the prior radiograph. 2. Left upper lobe cavitary lesion with peripheral air. 3. Stable cardiomegaly. Electronically Signed   By: Anner Crete M.D.   On: 04/06/2020 22:45   DG Chest 1 View  Result Date: 03/29/2020 CLINICAL DATA:  Short of breath.  History of lung carcinoma. EXAM: CHEST  1 VIEW COMPARISON:  03/18/2020 and earlier exams. FINDINGS: Stable changes from previous cardiac surgery. Left coronary artery stents. Masslike opacity superimposed over the right hilum is stable. No convincing mediastinal masses. Left hemithorax volume loss with mediastinal shift to the left. There is consolidation in the left lung most apparent in the upper lung, as well as linear/reticular type opacities, stable from the prior study. On the right, patchy airspace opacity is noted in the mid and lower lung superimposed on chronic interstitial thickening. The lung is hyperexpanded. No pneumothorax. Stable left anterior chest wall AICD and right anterior chest wall Port-A-Cath. IMPRESSION: 1. New patchy airspace opacity in the right mid to lower lung superimposed on chronic interstitial thickening. New lung opacities are suspicious for superimposed pneumonia. 2. No other change from the prior exam. Extensive chronic changes as detailed. Electronically Signed   By: Lajean Manes M.D.   On: 03/29/2020 08:39   CT ANGIO CHEST PE W OR WO CONTRAST  Result Date: 04/16/2020 CLINICAL DATA:  59 year old male with history of metastatic lung cancer presenting with shortness of breath. Concern for pulmonary embolism. EXAM: CT ANGIOGRAPHY CHEST WITH CONTRAST TECHNIQUE: Multidetector CT imaging of the chest was performed using the standard protocol during bolus administration of intravenous contrast. Multiplanar CT image reconstructions and MIPs were obtained to evaluate the vascular anatomy. CONTRAST:  55mL OMNIPAQUE IOHEXOL 350 MG/ML SOLN COMPARISON:  Chest CT dated 03/30/2020. FINDINGS: Cardiovascular: Mild to  moderate cardiomegaly. No pericardial effusion. Mild atherosclerotic calcification of the thoracic aorta. No aneurysmal dilatation. No acute pulmonary artery embolus identified. Mediastinum/Nodes: Hilar and mediastinal adenopathy as seen on the prior CT. Right hilar lymph node measures 19 mm short axis. A lymph node anterior to the right mainstem bronchus measures 2 cm in short axis. Clusters of lymph nodes in the prevascular space as seen previously. There are supraclavicular adenopathy. No mediastinal fluid collection. Lungs/Pleura: There is consolidative changes of the left upper lobe with a large cavitary lesion similar to prior CT. Diffuse interstitial coarsening and background of emphysema. Right upper lobe scarring and a 3.9 x 3.9 cm subpleural mass in the medial right lower lobe extending into the right hilum as seen previously. There is 3.6 x 3.7 cm mass with irregular/spiculated margin in the right lower lobe (97/6) which is new since the prior CT. Trace bilateral pleural effusions. There is no pneumothorax. The central airways are patent. Upper Abdomen: No acute abnormality. Musculoskeletal: Lytic destructive metastasis involving C7, seen on the prior CT. No new osseous metastasis. No acute osseous pathology. Median sternotomy wires. There is loss of subcutaneous fat and cachexia. Review of the MIP images confirms the above findings. IMPRESSION: 1. No CT evidence of acute pulmonary artery embolus. 2. Consolidative changes of the  left upper lobe with a large cavitary lesion containing intracavitary debris or fungal ball similar to prior CT. 3. Right lower lobe subpleural mass extending into the right hilum as seen previously. 4. An irregular/spiculated mass in the right lower lobe, new since the prior CT. 5. Lytic destructive metastasis involving C7, seen on the prior CT. No new osseous metastasis. 6. Trace bilateral pleural effusions. 7. Aortic Atherosclerosis (ICD10-I70.0) and Emphysema (ICD10-J43.9).  Electronically Signed   By: Anner Crete M.D.   On: 04/16/2020 17:42   CT ANGIO CHEST PE W OR WO CONTRAST  Result Date: 03/30/2020 CLINICAL DATA:  Pulmonary fibrosis, lung cancer, dyspnea EXAM: CT ANGIOGRAPHY CHEST WITH CONTRAST TECHNIQUE: Multidetector CT imaging of the chest was performed using the standard protocol during bolus administration of intravenous contrast. Multiplanar CT image reconstructions and MIPs were obtained to evaluate the vascular anatomy. CONTRAST:  160mL OMNIPAQUE IOHEXOL 350 MG/ML SOLN COMPARISON:  01/06/2020 FINDINGS: Cardiovascular: There is excellent opacification of the pulmonary arterial tree. There is no intraluminal filling defect identified to suggest acute pulmonary embolism. The central pulmonary arteries are enlarged in keeping with changes of pulmonary arterial hypertension. Coronary artery bypass grafting has been performed with stenting within a left anterior descending saphenous vein graft. There is mild global cardiomegaly with moderate left ventricular dilation and superimposed mild left ventricular hypertrophy. No pericardial effusion. Left subclavian single lead pacemaker is seen with its lead within the right ventricle. Right internal jugular chest port tip is seen within the right atrium. The thoracic aorta demonstrates mild atherosclerotic calcification, but is otherwise unremarkable. Mediastinum/Nodes: Thyroid unremarkable. Pathologic pre-vascular mediastinal adenopathy is again identified exact measurement is difficult due to streak artifact from overlying pacemaker, however, the index lymph node demonstrates progressive enlargement, measuring 2.0 x 2.8 cm at axial image # 47/5. Right hilar adenopathy has progressed with the index lymph node within the right hilum measuring 1.7 x 2.1 cm at axial image # 63/5. thyroid unremarkable. Esophagus unremarkable. Lungs/Pleura: Severe, end-stage cystic lung disease within the left apex with associated left-sided volume  loss is again identified with probable 5 cm aspergillosis a again identified at the left apex. Moderate severe centrilobular paraseptal emphysema. There is interval development of diffuse ground-glass pulmonary infiltrate throughout the residual pulmonary parenchyma and new consolidation at the right lung base subpleurally, likely infectious in the appropriate clinical setting. Alternatively, diffuse infiltrate could relate to pulmonary edema with consolidation at the right lung base representing superimposed infection should or changes of radiation pneumonitis in the appropriate clinical setting. Pleural base mass at the right lung base demonstrates interval increase in size measuring 3.7 x 4.4 cm at axial image # 99/11 Upper Abdomen: Extensive reflux of contrast into the hepatic venous system secondary to at least some degree of right heart failure. No acute abnormality. Musculoskeletal: No acute bone abnormality. Review of the MIP images confirms the above findings. IMPRESSION: Interval development of diffuse ground-glass pulmonary infiltrate and right basilar subpleural consolidation. This may relate to diffuse infection or, more likely, diffuse pulmonary edema with superimposed infectious or post radiation change at the right lung base. 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 Superimposed moderate emphysema. Interval disease progression with enlargement of the neoplastic mass at the right lung base and developing right hilar adenopathy. Progressive pre-vascular adenopathy of unclear etiology. Moderate left ventricular dilation. Reflux of contrast into the hepatic venous vasculature in keeping with at least some degree of right heart failure. No pulmonary embolism. Aortic Atherosclerosis (ICD10-I70.0) and Emphysema (ICD10-J43.9).  Electronically Signed   By: Fidela Salisbury MD   On: 03/30/2020 22:33   DG Chest Port 1 View  Result Date: 04/13/2020 CLINICAL DATA:  Dyspnea,  lung cancer, pneumonia EXAM: PORTABLE CHEST 1 VIEW COMPARISON:  Multiple including 04/06/2020, 04/04/2020, and 03/18/2020 FINDINGS: Left-sided volume loss with multiple cystic lucencies are again identified at the left apex with superimposed consolidation at the left apex, likely corresponding to the aspirate the lung a noted on prior CT examination of 03/30/2020. Progressive coarse interstitial pulmonary infiltrate throughout the right lung, likely infectious in etiology. Tiny right pleural effusion now present. No pneumothorax. Medial right basilar spiculated mass is better visualized than on prior examination, likely corresponding to the primary malignancy. Right internal jugular chest port tip is unchanged within the superior cavoatrial junction. Moderate cardiomegaly with stenting of a saphenous vein bypass graft is again noted. Left subclavian single lead pacemaker defibrillator is unchanged. Median sternotomy has been performed. IMPRESSION: Progressive coarse interstitial infiltrate throughout the right lung most in keeping with progressive pneumonic infiltrate. 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 Stable spiculated neoplastic mass at the medial right lung base. Unchanged cardiomegaly. Electronically Signed   By: Fidela Salisbury MD   On: 04/13/2020 22:23   DG CHEST PORT 1 VIEW  Result Date: 04/04/2020 CLINICAL DATA:  Dyspnea, follow-up lung opacities, history of right lung cancer EXAM: PORTABLE CHEST 1 VIEW COMPARISON:  03/29/2020 chest radiograph. FINDINGS: Right internal jugular Port-A-Cath terminates over the cavoatrial junction. Stable configuration of single lead left subclavian ICD. Intact median sternotomy wires. Stable cardiomediastinal silhouette with mild cardiomegaly. No pneumothorax. Chronic bilateral costophrenic angle blunting. Chronic dense consolidation, volume loss and distortion in upper left  lung with associated bronchiectasis and chronic mycetoma as seen on prior chest CT. Patchy opacity throughout both lungs, slightly worsened in the upper right lung and improved at the right lung base. Known spiculated right retrocardiac mass. IMPRESSION: 1. Patchy opacity throughout both lungs, slightly worsened in the upper right lung and improved at the right lung base, favor multifocal pneumonia, with a component of pulmonary edema not excluded. 2. Known spiculated right retrocardiac lung mass. 3. Chronic dense consolidation, volume loss and distortion in the upper left lung with associated bronchiectasis and chronic mycetoma as seen on prior chest CT. Electronically Signed   By: Ilona Sorrel M.D.   On: 04/04/2020 15:58    Orson Eva, DO  [facility]  If 7PM-7AM, please contact night-coverage [email or web address] Password [email or web address] 04/17/2020, 4:54 PM   LOS: 3 days

## 2020-04-17 NOTE — Plan of Care (Signed)
  Problem: Education: Goal: Knowledge of General Education information will improve Description: Including pain rating scale, medication(s)/side effects and non-pharmacologic comfort measures Outcome: Not Progressing   Problem: Health Behavior/Discharge Planning: Goal: Ability to manage health-related needs will improve Outcome: Not Progressing   Problem: Clinical Measurements: Goal: Ability to maintain clinical measurements within normal limits will improve Outcome: Not Progressing Goal: Will remain free from infection Outcome: Not Progressing Goal: Diagnostic test results will improve Outcome: Not Progressing Goal: Respiratory complications will improve Outcome: Not Progressing Goal: Cardiovascular complication will be avoided Outcome: Not Progressing   Problem: Activity: Goal: Risk for activity intolerance will decrease Outcome: Not Progressing   Problem: Nutrition: Goal: Adequate nutrition will be maintained Outcome: Not Progressing   Problem: Coping: Goal: Level of anxiety will decrease Outcome: Not Progressing   Problem: Elimination: Goal: Will not experience complications related to bowel motility Outcome: Not Progressing Goal: Will not experience complications related to urinary retention Outcome: Not Progressing   Problem: Pain Managment: Goal: General experience of comfort will improve Outcome: Not Progressing   Problem: Safety: Goal: Ability to remain free from injury will improve Outcome: Not Progressing   Problem: Skin Integrity: Goal: Risk for impaired skin integrity will decrease Outcome: Not Progressing   Problem: Education: Goal: Knowledge of disease or condition will improve Outcome: Not Progressing Goal: Knowledge of the prescribed therapeutic regimen will improve Outcome: Not Progressing Goal: Individualized Educational Video(s) Outcome: Not Progressing   Problem: Activity: Goal: Ability to tolerate increased activity will  improve Outcome: Not Progressing Goal: Will verbalize the importance of balancing activity with adequate rest periods Outcome: Not Progressing   Problem: Respiratory: Goal: Ability to maintain a clear airway will improve Outcome: Not Progressing Goal: Levels of oxygenation will improve Outcome: Not Progressing Goal: Ability to maintain adequate ventilation will improve Outcome: Not Progressing

## 2020-04-17 NOTE — Progress Notes (Signed)
Patient with endstage heart failure along with both lung cancer and prostate cancer, initialyl on home hospice however this admission has asked for medical management. We are following the patient peripherally as very limited cardiac options we have to offer.    From cardiac standpoint bp's have not tolerated CHF medications other than low dose toprol. He is not a candidate for advanced therapies given his advanced cancer. Really only have diuresis to offer to help with symptoms. He is on IV lasix 80mg  bid, net negative just 227mL yesterday. Uptrend in Cr with diuresis, ongoing high O2 requirement, he is also being treated for pneumonia and COPD exacerbation, procalcitonin was 18. CTA yesterday showed no PE, LUL consolidatative changes with a large cavitary lesion with intracavitary debris  Or fungal ball, RLL subpleural mass extending into hilum with a new spiculated mass in RLL since prior CT, CT lytic lesion. I think ongoing breathing issues are more related to lung as opposed to CHF. Hold diuretic today, possibly oral torsemide 40mg  bid starting tomorrow.   Episodes of SVT and sinus tach in setting of systemic illness, he is on oral toprol. Do not think bp's would tolerate higher doses, would load with IV digoxin and then start 0.125mg  daily for maintence which may also help some with HF symptoms.  If refractory depending on further goals of care discussions could start oral amio    Continue palliative discussions, very poor prognosis.   Carlyle Dolly MD

## 2020-04-17 NOTE — TOC Progression Note (Signed)
Transition of Care [facility]) - Progression Note   Patient Details  Name: Cung Masterson MRN: [id number] Date of Birth: 1960-07-27  Transition [facility]) CM/SW [place], LCSW Phone Number: 04/17/2020, 10:45 AM  Clinical Narrative: CSW followed up with Jackelyn Poling from [place] regarding patient being transferred to [facility] sister facility in [place]. Per Jackelyn Poling, that facility does not have a respiratory therapist on staff to accommodate patient needing 7L/min for O2. Debbie reported the patient was discharged to [facility] on 5L, which is what the sister facility also allows, but patient had respiratory distress on 5L leading to his hospital admission.  CSW called [facility] and spoke with [place] in admissions. Lilia Pro requested the referral be re-faxed. Referral faxed again to [facility] [phone]). TOC to follow.  Expected Discharge Plan: [place] (With hospice services) Barriers to Discharge: Continued Medical Work up  Expected Discharge Plan and Services Expected Discharge Plan: [place] (With hospice services) In-house Referral: Clinical Social Work Discharge Planning Services: NA Post Acute Care Choice: Hospice Living arrangements for the past 2 months: [place]              DME Arranged: N/A DME Agency: NA HH Arranged: NA [place] Agency: NA  Readmission Risk Interventions Readmission Risk Prevention Plan 03/28/2020  Transportation Screening Complete  HRI or Home Care Consult Complete  Social Work Consult for [place] Planning/Counseling Complete  Palliative Care Screening Not Applicable  Medication Review Press photographer) Complete  Some recent data might be hidden

## 2020-04-17 NOTE — Progress Notes (Signed)
Physical Therapy Treatment Patient Details Name: [name] MRN: [id number] DOB: Oct 15, 1960 Today's Date: 04/17/2020    History of Present Illness [name]  is a 59 y.o. male, with history of of childhood TB, MI, prostate cancer, lung cancer, COPD, CAD, CHF 2/2 ischemic cardiomyopathy and more presents to the ED with a chief complaint of dyspnea. Patient reports that it started at 6:00pm on 04/13/20. It was gradual in onset and associated with intermittent palpitations. Patient denies chest pain, nausea, diaphoresis, orthopnea, peripheral edema. Patient is not very forthcoming with details and reports that he is "about to die" and wishes that he would just "die and it would be over with." Patient was supposed to have hospice consult at SNF, but for unknown reasons that did not happen. Patient reports that when he started to feel short of breath, they turned his O2 up to 5L. When that did not relieve his symptoms he was sent to the ER.  Patient was recently admitted 10/20 - 11/12. He was supposed to be under hospice care at discharge. At that time he had been admitted for dyspnea x 2 weeks. At that time he had a twenty point weight gain associated with his dyspnea at that time. He was treated for acute on chronic combined systolic and diastolic heart failure, and at that time he required up to 15L HFNC. He was weaned down to 5L [place] per DC summary, but patient says he was actually able to get down to 4 at the SNF.    PT Comments    Patient transfer to sitting EOB with slow, labored movements with HOB elevated without assist. Patient demonstrates good sitting balance while seated EOB. He transfers to standing with HHA and immediately reaches for chair arm rest for support while ambulating several feet to chair with unsteady steps. Patient tolerates sitting in chair and is positioned for comfort with RN present. Patient will benefit from continued physical therapy in hospital and recommended venue  below to increase strength, balance, endurance for safe ADLs and gait.   Follow Up Recommendations  SNF     Equipment Recommendations  Rolling walker with 5" wheels;3in1 (PT)    Recommendations for Other Services       Precautions / Restrictions Precautions Precautions: Fall    Mobility  Bed Mobility Overal bed mobility: Independent                Transfers Overall transfer level: Needs assistance Equipment used: None Transfers: Sit to/from Stand;Stand Pivot Transfers Sit to Stand: Min assist Stand pivot transfers: Min assist       General transfer comment: unsteadiness due to generalized weakness and UE support on furniture for support  Ambulation/Gait Ambulation/Gait assistance: Min assist Gait Distance (Feet): 3 Feet Assistive device: None   Gait velocity: decreased   General Gait Details: unsteady, requires assist to remain standing while reaching for near by chair for support , small, labored steps   Stairs             Wheelchair Mobility    Modified Rankin (Stroke Patients Only)       Balance Overall balance assessment: Mild deficits observed, not formally tested                                          Cognition Arousal/Alertness: Awake/alert Behavior During Therapy: Flat affect Overall Cognitive Status: Within Functional Limits for tasks  assessed                                        Exercises      General Comments General comments (skin integrity, edema, etc.): unsteady with standing      Pertinent Vitals/Pain Faces Pain Scale: Hurts little more Pain Location: legs Pain Descriptors / Indicators: Aching Pain Intervention(s): Limited activity within patient's tolerance;Monitored during session;Repositioned    Home Living                      Prior Function            PT Goals (current goals can now be found in the care plan section) Acute Rehab PT Goals Patient Stated  Goal: Be able to return home with some assistance PT Goal Formulation: With patient Time For Goal Achievement: 04/22/20 Potential to Achieve Goals: Good Progress towards PT goals: Progressing toward goals    Frequency    Min 3X/week      PT Plan Current plan remains appropriate    Co-evaluation              AM-PAC PT "6 Clicks" Mobility   Outcome Measure  Help needed turning from your back to your side while in a flat bed without using bedrails?: None Help needed moving from lying on your back to sitting on the side of a flat bed without using bedrails?: None Help needed moving to and from a bed to a chair (including a wheelchair)?: A Little Help needed standing up from a chair using your arms (e.g., wheelchair or bedside chair)?: A Little Help needed to walk in hospital room?: A Lot Help needed climbing 3-5 steps with a railing? : A Lot 6 Click Score: 18    End of Session Equipment Utilized During Treatment: Oxygen Activity Tolerance: Patient tolerated treatment well;Patient limited by fatigue Patient left: with call bell/phone within reach;with nursing/sitter in room;in chair Nurse Communication: Mobility status PT Visit Diagnosis: Unsteadiness on feet (R26.81);Other abnormalities of gait and mobility (R26.89);Muscle weakness (generalized) (M62.81)     Time: 6286-3817 PT Time Calculation (min) (ACUTE ONLY): 14 min  Charges:  $Therapeutic Activity: 8-22 mins                     3:18 PM, 04/17/20 Mearl Latin PT, DPT Physical Therapist at [facility]

## 2020-04-18 DIAGNOSIS — J441 Chronic obstructive pulmonary disease with (acute) exacerbation: Secondary | ICD-10-CM | POA: Diagnosis not present

## 2020-04-18 DIAGNOSIS — A419 Sepsis, unspecified organism: Secondary | ICD-10-CM | POA: Diagnosis not present

## 2020-04-18 DIAGNOSIS — I5043 Acute on chronic combined systolic (congestive) and diastolic (congestive) heart failure: Secondary | ICD-10-CM | POA: Diagnosis not present

## 2020-04-18 DIAGNOSIS — Z7189 Other specified counseling: Secondary | ICD-10-CM | POA: Diagnosis not present

## 2020-04-18 DIAGNOSIS — C3491 Malignant neoplasm of unspecified part of right bronchus or lung: Secondary | ICD-10-CM | POA: Diagnosis not present

## 2020-04-18 LAB — CBC
HCT: 21.8 % — ABNORMAL LOW (ref 39.0–52.0)
Hemoglobin: 6.7 g/dL — CL (ref 13.0–17.0)
MCH: 29.9 pg (ref 26.0–34.0)
MCHC: 30.7 g/dL (ref 30.0–36.0)
MCV: 97.3 fL (ref 80.0–100.0)
Platelets: 403 10*3/uL — ABNORMAL HIGH (ref 150–400)
RBC: 2.24 MIL/uL — ABNORMAL LOW (ref 4.22–5.81)
RDW: 18.1 % — ABNORMAL HIGH (ref 11.5–15.5)
WBC: 14.3 10*3/uL — ABNORMAL HIGH (ref 4.0–10.5)
nRBC: 3.5 % — ABNORMAL HIGH (ref 0.0–0.2)

## 2020-04-18 LAB — BASIC METABOLIC PANEL
Anion gap: 9 (ref 5–15)
BUN: 60 mg/dL — ABNORMAL HIGH (ref 6–20)
CO2: 33 mmol/L — ABNORMAL HIGH (ref 22–32)
Calcium: 9.5 mg/dL (ref 8.9–10.3)
Chloride: 90 mmol/L — ABNORMAL LOW (ref 98–111)
Creatinine, Ser: 1.68 mg/dL — ABNORMAL HIGH (ref 0.61–1.24)
GFR, Estimated: 47 mL/min — ABNORMAL LOW (ref >60)
Glucose, Bld: 173 mg/dL — ABNORMAL HIGH (ref 70–99)
Potassium: 4.7 mmol/L (ref 3.5–5.1)
Sodium: 132 mmol/L — ABNORMAL LOW (ref 135–145)

## 2020-04-18 LAB — BRAIN NATRIURETIC PEPTIDE: B Natriuretic Peptide: 3823 pg/mL — ABNORMAL HIGH (ref 0.0–100.0)

## 2020-04-18 LAB — PREPARE RBC (CROSSMATCH)

## 2020-04-18 LAB — MAGNESIUM: Magnesium: 1.7 mg/dL (ref 1.7–2.4)

## 2020-04-18 MED ORDER — PHENOL 1.4 % MT LIQD
1.0000 | OROMUCOSAL | Status: DC | PRN
  Administered 2020-04-18: 1 via OROMUCOSAL
  Filled 2020-04-18: qty 177

## 2020-04-18 MED ORDER — FUROSEMIDE 10 MG/ML IJ SOLN
80.0000 mg | Freq: Once | INTRAMUSCULAR | Status: AC
  Administered 2020-04-19: 80 mg via INTRAVENOUS
  Filled 2020-04-18: qty 8

## 2020-04-18 MED ORDER — SODIUM CHLORIDE 0.9% IV SOLUTION: Freq: Once | INTRAVENOUS | Status: DC

## 2020-04-18 NOTE — Plan of Care (Signed)
  Problem: Education: Goal: Knowledge of General Education information will improve Description Including pain rating scale, medication(s)/side effects and non-pharmacologic comfort measures Outcome: Progressing   

## 2020-04-18 NOTE — TOC Progression Note (Signed)
Transition of Care [facility]) - Progression Note    Patient Details  Name: Adelbert Gaspard MRN: [id number] Date of Birth: 12-09-1960  [facility]) CM/SW Contact  Natasha Bence, LCSW Phone Number: 04/18/2020, 2:50 PM  Clinical Narrative:    Family has discussed goals of care with MD. Family considering [facility] hospice in order to have patient be close to family. Patient's residences is currently in [place], [place] to follow.    Expected Discharge Plan: [place] (With hospice services) Barriers to Discharge: Continued Medical Work up  Expected Discharge Plan and Services Expected Discharge Plan: [place] (With hospice services) In-house Referral: Clinical Social Work Discharge Planning Services: NA Post Acute Care Choice: Hospice Living arrangements for the past 2 months: [place]                 DME Arranged: N/A DME Agency: NA       HH Arranged: NA [place] Agency: NA         Social Determinants of Health (SDOH) Interventions    Readmission Risk Interventions Readmission Risk Prevention Plan 03/28/2020  Transportation Screening Complete  HRI or Home Care Consult Complete  Social Work Consult for [place] Planning/Counseling [place] Not Applicable  Medication Review Press photographer) Complete  Some recent data might be hidden

## 2020-04-18 NOTE — Progress Notes (Signed)
Pharmacy Antibiotic Note  [name] is a 59 y.o. male admitted on 04/13/2020 with pneumonia.  Pharmacy has been consulted for Cefepime dosing.  Plan: Cefepime 2000 mg IV every 12 hours. Monitor labs, c/s, and vanco level as indicated.  Height: 5\' 7"  (170.2 cm) Weight: 54 kg (119 lb 0.8 oz) IBW/kg (Calculated) : 66.1  Temp (24hrs), Avg:97.7 F (36.5 C), Min:97.5 F (36.4 C), Max:97.8 F (36.6 C)  Recent Labs  Lab 04/13/20 2148 04/14/20 0050 04/16/20 0450 04/17/20 0505 04/18/20 0637  WBC 10.8*  --  10.5 13.9* 14.3*  CREATININE 0.97  --  1.08 1.49* 1.68*  LATICACIDVEN  --  1.2  --   --   --     Estimated Creatinine Clearance: 37.1 mL/min (A) (by C-G formula based on SCr of 1.68 mg/dL (H)).    No Known Allergies  Antimicrobials this admission: Vanco 11/17 >> 11/18 Cefepime 11/17 >>    Microbiology results: 11/17 BCx: cancelled 11/17 Sputum: cancelled  11/17 MRSA PCR: negative   Thank you for allowing pharmacy to be a part of this patients care.  Donna Christen Genoveva Singleton 04/18/2020 8:35 AM

## 2020-04-18 NOTE — Progress Notes (Addendum)
PROGRESS NOTE  Garrett Ishman EGB:[id number] DOB: 06-01-1960 DOA: 04/13/2020 PCP: [name], MD Brief History: 59 y.o.malepast medical history significant for coronary artery disease status post CABG, ischemic cardiomyopathy with an EF of 20% status post AICD placement, chronic hypoxic respiratory failure secondary to COPD on 4 L of oxygen, stage Ia non-small cell cancer diagnosed in February 2015 status post right lower lobe wedge resection as well as SBRT with a recurrent disease to the right lower lobe in June 2019 which has been in observation since then. Stage T1 adenocarcinoma of the prostate with a Gleason score of 4 diagnosed in November 2019 with a confirmed PET scan and biopsy of the right lower mass consistent with adenocarcinoma currently on chemotherapy, chemotherapy-induced anemia status post transfusion. Patient presented secondary to dyspnea.Patient was recently hospitalized from 03/18/2020 to 04/10/2020 during which she had acute on chronic respiratory failure secondary to pneumonia as well as acute on chronic systolic and diastolic CHF. The patient was diuresed with intravenous furosemide and treated with antibiotics. He was stabilized and subsequently discharged to skilled nursing facility with hospice care. He was seen by palliative medicine at that time and felt that he was a poor candidate to resume any chemotherapy. Subsequently, the patient's AICD was deactivated on 04/04/2020. The patient was discharged to [facility] up with palliative services. Unfortunately, the patient represented back to the emergency department on 04/13/2020 secondary to worsening shortness of breath with increasing oxygen requirement. Chest x-ray showed progressive coarsening of his interstitial opacities with stable left-sided volume loss and consolidation in the left apex. Initially, the patient voiced that he did not want any antibiotics or aggressive therapy. However after further  discussions with palliative medicine as well as the medicine team, the patient decided that he wanted to continue therapy at this time.  Assessment/Plan: Acute on chronic respiratory failure with hypoxia -Patient was discharged to SNF on 5 L. -Currently on 7 L nasal cannula>>6L -Secondary to pulmonary edema, COPD exacerbation, pneumonia, and possibly radiation pneumonitis and lymphangitic spread of his lung cancer. -11/18--CTA chest--no PE; LUL consolidatative changes with a large cavitary lesion with intracavitary debris Or fungal ball, RLL subpleural mass extending into hilum with a new spiculated mass in RLL since prior CT, C7 lytic lesion -unable to really wean down oxygen  Lobar pneumonia -Conitnuecefepime -MRSA screen--neg -d/c vanc  Acute on chronic combined systolic and diastolic CHF -37/02/6268 echo EF<20%, moderate to severe dilated LV, severe diffuse HK -Restartedfurosemide 80 mg IV twice daily -Continue metoprolol succinate for now -Entresto and carvedilol--could not tolerate previously due to hypotension -cardiology input appreciated-->long term prognosis is very poor and there is no cardiac intervention that will meaningfully improve that. Can continue IV lasix;  When converted to oral diuretic would change to torsemide 40mg  bid -plan to start torsemide 40 mg bid 04/19/20 if renal function stable  COPD exacerbation -continuePulmicort -continueduo nebs -continueIV Solu-Medrol -add brovana  AKI -due to diuresis, possibly recent contrast -baseline creatinine 0.7-1.0 -serum creatinine peaking 1.68 -start torsemide 40 mg bid on 04/19/20 if creatinine stablizes  SVT -having intermittent SVT -BP will not likely tolerate titration of metoprolol -added digoxin  Adenocarcinoma of the right lung Progressive disease. Not a candidate for chemotherapy secondary to severe deconditioning and heart failure(03/16/20 note). Palliative care consulted -Patient is  followed by Dr. [name] and [name] receiving systemic chemotherapy every 3 weeks  Sepsis --present on admission -due to pneumonia --initiallypatient didnot want abx or intervention --now wants  work upand abx --PCT 16.26  Prostate adenocarcinoma -Diagnosed in November 2019. Patient did have he is currently being followed by Dr. [name] seed implants.  Hyponatremia --secondaryfluid overload, possibly low solute intake --anticipate some improvement with diuresis  Acute on chronic Anemia of chronic disease --no bleeding reported. --Y86 578 --folic 46.9 --iron saturation 29/ferritin 1010 -04/18/20--transfuse 2 units PRBC with lasix IV x 1 in between 2 units  Goals of care -Palliative medicine is following -Patient states that he wants continued medical treatment including antibiotics and diuresis for now -04/18/20--GOC discussion with mother and sister in room-->discussed current difficulty placing pt in SNF in [place] in part due to high oxygen demand.  Discussed continuing current care in hopes of improvement for d/c to SNF versus transitioning to full comfort and trying to pursue residential hospice in [facility]    Status is: Inpatient  Remains inpatient appropriate because:IV treatments appropriate due to intensity of illness or inability to take PO   Dispo: The patient is from:Home Anticipated d/c is GE:[facility] Anticipated d/c date is: 2 days Patient currently is not medically stable to d/c.        Family Communication:mother and sister updated @ bedside 11/20  Consultants:Palliative medicine  Code Status: DNR  DVT Prophylaxis: [place] Heparin    Procedures: As Listed in Progress Note Above  Antibiotics: Vanco 11/17>>11/19 Cefepime 11/17>>     Total time spent 45 minutes.  Greater than 50% spent face to face counseling and coordinating  care.    Subjective: Patient denies fevers, chills, headache, chest pain,  nausea, vomiting, diarrhea, abdominal pain, dysuria, hematuria.  Still has sob with minimal exertion   Objective: Vitals:   04/18/20 0810 04/18/20 0818 04/18/20 1000 04/18/20 1352  BP:    (!) 99/57  Pulse:   88 84  Resp:    20  Temp:    (!) 97 F (36.1 C)  TempSrc:      SpO2: 98% 100%  94%  Weight:      Height:        Intake/Output Summary (Last 24 hours) at 04/18/2020 1450 Last data filed at 04/18/2020 0953 Gross per 24 hour  Intake 480 ml  Output 800 ml  Net -320 ml   Weight change:  Exam:   General:  Pt is alert, follows commands appropriately, not in acute distress  HEENT: No icterus, No thrush, No neck mass, [place]/AT  Cardiovascular: RRR, S1/S2, no rubs, no gallops  Respiratory: bilateral rales.  Bilateral wheeze  Abdomen: Soft/+BS, non tender, non distended, no guarding  Extremities: 1+LE edema, No lymphangitis, No petechiae, No rashes, no synovitis   Data Reviewed: I have personally reviewed following labs and imaging studies Basic Metabolic Panel: Recent Labs  Lab 04/13/20 2148 04/16/20 0450 04/17/20 0505 04/18/20 0637  NA 130* 131* 131* 132*  K 4.1 4.4 4.7 4.7  CL 85* 86* 87* 90*  CO2 36* 34* 32 33*  GLUCOSE 131* 184* 200* 173*  BUN 30* 41* 51* 60*  CREATININE 0.97 1.08 1.49* 1.68*  CALCIUM 8.8* 9.3 9.8 9.5  MG  --   --   --  1.7   Liver Function Tests: Recent Labs  Lab 04/13/20 2148 04/17/20 0505  AST 11* 13*  ALT 12 12  ALKPHOS 69 98  BILITOT 0.4 0.3  PROT 7.3 7.7  ALBUMIN 2.6* 2.7*   No results for input(s): LIPASE, AMYLASE in the last 168 hours. No results for input(s): AMMONIA in the last 168 hours. Coagulation Profile: No results  for input(s): INR, PROTIME in the last 168 hours. CBC: Recent Labs  Lab 04/13/20 2148 04/16/20 0450 04/17/20 0505 04/18/20 0637  WBC 10.8* 10.5 13.9* 14.3*  NEUTROABS 8.1*  --   --   --   HGB 7.4* 7.1* 7.1* 6.7*   HCT 23.4* 23.6* 23.4* 21.8*  MCV 93.2 95.2 96.3 97.3  PLT 361 440* 448* 403*   Cardiac Enzymes: No results for input(s): CKTOTAL, CKMB, CKMBINDEX, TROPONINI in the last 168 hours. BNP: Invalid input(s): POCBNP CBG: No results for input(s): GLUCAP in the last 168 hours. HbA1C: No results for input(s): HGBA1C in the last 72 hours. Urine analysis:    Component Value Date/Time   COLORURINE YELLOW 04/14/2020 [place] 04/14/2020 0450   LABSPEC 1.013 04/14/2020 0450   PHURINE 5.0 04/14/2020 0450   GLUCOSEU NEGATIVE 04/14/2020 0450   HGBUR NEGATIVE 04/14/2020 0450   BILIRUBINUR NEGATIVE 04/14/2020 0450   KETONESUR NEGATIVE 04/14/2020 0450   PROTEINUR NEGATIVE 04/14/2020 0450   UROBILINOGEN 0.2 08/06/2013 1436   NITRITE NEGATIVE 04/14/2020 0450   LEUKOCYTESUR NEGATIVE 04/14/2020 0450   Sepsis Labs: @LABRCNTIP (procalcitonin:4,lacticidven:4) ) Recent Results (from the past 240 hour(s))  SARS Coronavirus 2 by RT PCR (hospital order, performed in [facility] hospital lab) Nasopharyngeal Nasopharyngeal Swab     Status: None   Collection Time: 04/09/20 10:08 AM   Specimen: Nasopharyngeal Swab  Result Value Ref Range Status   SARS Coronavirus 2 NEGATIVE NEGATIVE Final    Comment: (NOTE) SARS-CoV-2 target nucleic acids are NOT DETECTED.  The SARS-CoV-2 RNA is generally detectable in upper and lower respiratory specimens during the acute phase of infection. The lowest concentration of SARS-CoV-2 viral copies this assay can detect is 250 copies / mL. A negative result does not preclude SARS-CoV-2 infection and should not be used as the sole basis for treatment or other patient management decisions.  A negative result may occur with improper specimen collection / handling, submission of specimen other than nasopharyngeal swab, presence of viral mutation(s) within the areas targeted by this assay, and inadequate number of viral copies (<250 copies / mL). A negative  result must be combined with clinical observations, patient history, and epidemiological information.  Fact Sheet for Patients:   StrictlyIdeas.no  Fact Sheet for Healthcare Providers: BankingDealers.co.za  This test is not yet approved or  cleared by the [place] FDA and has been authorized for detection and/or diagnosis of SARS-CoV-2 by FDA under an Emergency Use Authorization (EUA).  This EUA will remain in effect (meaning this test can be used) for the duration of the COVID-19 declaration under Section 564(b)(1) of the Act, 21 U.S.C. section 360bbb-3(b)(1), unless the authorization is terminated or revoked sooner.  Performed at [facility], [place] [address] [place], [place] [address]   Resp Panel by RT PCR (RSV, Flu A&B, Covid) - Nasopharyngeal Swab     Status: None   Collection Time: 04/13/20  9:48 PM   Specimen: Nasopharyngeal Swab  Result Value Ref Range Status   SARS Coronavirus 2 by RT PCR NEGATIVE NEGATIVE Final    Comment: (NOTE) SARS-CoV-2 target nucleic acids are NOT DETECTED.  The SARS-CoV-2 RNA is generally detectable in upper respiratoy specimens during the acute phase of infection. The lowest concentration of SARS-CoV-2 viral copies this assay can detect is 131 copies/mL. A negative result does not preclude SARS-Cov-2 infection and should not be used as the sole basis for treatment or other patient management decisions. A negative result may occur with  improper specimen collection/handling, submission of specimen other than nasopharyngeal swab, presence of viral mutation(s) within the areas targeted by this assay, and inadequate number of viral copies (<131 copies/mL). A negative result must be combined with clinical observations, patient history, and epidemiological information. The expected result is Negative.  Fact Sheet for Patients:  PinkCheek.be  Fact Sheet for  Healthcare Providers:  GravelBags.it  This test is no t yet approved or cleared by the [place] FDA and  has been authorized for detection and/or diagnosis of SARS-CoV-2 by FDA under an Emergency Use Authorization (EUA). This EUA will remain  in effect (meaning this test can be used) for the duration of the COVID-19 declaration under Section 564(b)(1) of the Act, 21 U.S.C. section 360bbb-3(b)(1), unless the authorization is terminated or revoked sooner.     Influenza A by PCR NEGATIVE NEGATIVE Final   Influenza B by PCR NEGATIVE NEGATIVE Final    Comment: (NOTE) The Xpert Xpress SARS-CoV-2/FLU/RSV assay is intended as an aid in  the diagnosis of influenza from Nasopharyngeal swab specimens and  should not be used as a sole basis for treatment. Nasal washings and  aspirates are unacceptable for Xpert Xpress SARS-CoV-2/FLU/RSV  testing.  Fact Sheet for Patients: PinkCheek.be  Fact Sheet for Healthcare Providers: GravelBags.it  This test is not yet approved or cleared by the [place] FDA and  has been authorized for detection and/or diagnosis of SARS-CoV-2 by  FDA under an Emergency Use Authorization (EUA). This EUA will remain  in effect (meaning this test can be used) for the duration of the  Covid-19 declaration under Section 564(b)(1) of the Act, 21  U.S.C. section 360bbb-3(b)(1), unless the authorization is  terminated or revoked.    Respiratory Syncytial Virus by PCR NEGATIVE NEGATIVE Final    Comment: (NOTE) Fact Sheet for Patients: PinkCheek.be  Fact Sheet for Healthcare Providers: GravelBags.it  This test is not yet approved or cleared by the [place] FDA and  has been authorized for detection and/or diagnosis of SARS-CoV-2 by  FDA under an Emergency Use Authorization (EUA). This EUA will remain  in effect  (meaning this test can be used) for the duration of the  COVID-19 declaration under Section 564(b)(1) of the Act, 21 U.S.C.  section 360bbb-3(b)(1), unless the authorization is terminated or  revoked. Performed at [facility], [address], [place], [place] [address]   MRSA PCR Screening     Status: None   Collection Time: 04/16/20 11:31 AM   Specimen: Nasal Mucosa; Nasopharyngeal  Result Value Ref Range Status   MRSA by PCR NEGATIVE NEGATIVE Final    Comment:        The GeneXpert MRSA Assay (FDA approved for NASAL specimens only), is one component of a comprehensive MRSA colonization surveillance program. It is not intended to diagnose MRSA infection nor to guide or monitor treatment for MRSA infections. Performed at [facility], [address] [place], [place] [address]      Scheduled Meds: . sodium chloride   Intravenous Once  . arformoterol  15 mcg Nebulization BID  . budesonide (PULMICORT) nebulizer solution  0.5 mg Nebulization BID  . Chlorhexidine Gluconate Cloth  6 each Topical Daily  . digoxin  0.125 mg Oral Daily  . feeding supplement  237 mL Oral BID BM  . folic acid  1 mg Oral Daily  . furosemide  80 mg Intravenous Once  . heparin  5,000 Units Subcutaneous Q8H  . ipratropium-albuterol  3 mL Nebulization Q6H  .  methylPREDNISolone (SOLU-MEDROL) injection  60 mg Intravenous Q12H  . metoprolol succinate  25 mg Oral Daily   Continuous Infusions: . ceFEPime (MAXIPIME) IV 2 g (04/18/20 0958)    Procedures/Studies: DG Chest 1 View  Result Date: 04/06/2020 CLINICAL DATA:  59 year old male with shortness of breath. EXAM: CHEST  1 VIEW COMPARISON:  Chest radiograph dated 04/04/2020. FINDINGS: Right-sided Port-A-Cath with tip at the cavoatrial junction. Diffuse chronic interstitial coarsening as seen on the prior radiograph. No new consolidation. No large pleural effusion or pneumothorax. Thickening of the left upper lobe pleural surface. Left upper lobe cavitary  lesion with peripheral air. There is stable cardiomegaly. Left pectoral AICD device. Median sternotomy wires and coronary stent. No acute osseous pathology. IMPRESSION: 1. No new consolidation. Overall no significant interval change in the appearance of the lungs compared to the prior radiograph. 2. Left upper lobe cavitary lesion with peripheral air. 3. Stable cardiomegaly. Electronically Signed   By: Anner Crete M.D.   On: 04/06/2020 22:45   DG Chest 1 View  Result Date: 03/29/2020 CLINICAL DATA:  Short of breath.  History of lung carcinoma. EXAM: CHEST  1 VIEW COMPARISON:  03/18/2020 and earlier exams. FINDINGS: Stable changes from previous cardiac surgery. Left coronary artery stents. Masslike opacity superimposed over the right hilum is stable. No convincing mediastinal masses. Left hemithorax volume loss with mediastinal shift to the left. There is consolidation in the left lung most apparent in the upper lung, as well as linear/reticular type opacities, stable from the prior study. On the right, patchy airspace opacity is noted in the mid and lower lung superimposed on chronic interstitial thickening. The lung is hyperexpanded. No pneumothorax. Stable left anterior chest wall AICD and right anterior chest wall Port-A-Cath. IMPRESSION: 1. New patchy airspace opacity in the right mid to lower lung superimposed on chronic interstitial thickening. New lung opacities are suspicious for superimposed pneumonia. 2. No other change from the prior exam. Extensive chronic changes as detailed. Electronically Signed   By: Lajean Manes M.D.   On: 03/29/2020 08:39   CT ANGIO CHEST PE W OR WO CONTRAST  Result Date: 04/16/2020 CLINICAL DATA:  59 year old male with history of metastatic lung cancer presenting with shortness of breath. Concern for pulmonary embolism. EXAM: CT ANGIOGRAPHY CHEST WITH CONTRAST TECHNIQUE: Multidetector CT imaging of the chest was performed using the standard protocol during bolus  administration of intravenous contrast. Multiplanar CT image reconstructions and MIPs were obtained to evaluate the vascular anatomy. CONTRAST:  23mL OMNIPAQUE IOHEXOL 350 MG/ML SOLN COMPARISON:  Chest CT dated 03/30/2020. FINDINGS: Cardiovascular: Mild to moderate cardiomegaly. No pericardial effusion. Mild atherosclerotic calcification of the thoracic aorta. No aneurysmal dilatation. No acute pulmonary artery embolus identified. Mediastinum/Nodes: Hilar and mediastinal adenopathy as seen on the prior CT. Right hilar lymph node measures 19 mm short axis. A lymph node anterior to the right mainstem bronchus measures 2 cm in short axis. Clusters of lymph nodes in the prevascular space as seen previously. There are supraclavicular adenopathy. No mediastinal fluid collection. Lungs/Pleura: There is consolidative changes of the left upper lobe with a large cavitary lesion similar to prior CT. Diffuse interstitial coarsening and background of emphysema. Right upper lobe scarring and a 3.9 x 3.9 cm subpleural mass in the medial right lower lobe extending into the right hilum as seen previously. There is 3.6 x 3.7 cm mass with irregular/spiculated margin in the right lower lobe (97/6) which is new since the prior CT. Trace bilateral pleural effusions. There is no pneumothorax.  The central airways are patent. Upper Abdomen: No acute abnormality. Musculoskeletal: Lytic destructive metastasis involving C7, seen on the prior CT. No new osseous metastasis. No acute osseous pathology. Median sternotomy wires. There is loss of subcutaneous fat and cachexia. Review of the MIP images confirms the above findings. IMPRESSION: 1. No CT evidence of acute pulmonary artery embolus. 2. Consolidative changes of the left upper lobe with a large cavitary lesion containing intracavitary debris or fungal ball similar to prior CT. 3. Right lower lobe subpleural mass extending into the right hilum as seen previously. 4. An irregular/spiculated  mass in the right lower lobe, new since the prior CT. 5. Lytic destructive metastasis involving C7, seen on the prior CT. No new osseous metastasis. 6. Trace bilateral pleural effusions. 7. Aortic Atherosclerosis (ICD10-I70.0) and Emphysema (ICD10-J43.9). Electronically Signed   By: Anner Crete M.D.   On: 04/16/2020 17:42   CT ANGIO CHEST PE W OR WO CONTRAST  Result Date: 03/30/2020 CLINICAL DATA:  Pulmonary fibrosis, lung cancer, dyspnea EXAM: CT ANGIOGRAPHY CHEST WITH CONTRAST TECHNIQUE: Multidetector CT imaging of the chest was performed using the standard protocol during bolus administration of intravenous contrast. Multiplanar CT image reconstructions and MIPs were obtained to evaluate the vascular anatomy. CONTRAST:  175mL OMNIPAQUE IOHEXOL 350 MG/ML SOLN COMPARISON:  01/06/2020 FINDINGS: Cardiovascular: There is excellent opacification of the pulmonary arterial tree. There is no intraluminal filling defect identified to suggest acute pulmonary embolism. The central pulmonary arteries are enlarged in keeping with changes of pulmonary arterial hypertension. Coronary artery bypass grafting has been performed with stenting within a left anterior descending saphenous vein graft. There is mild global cardiomegaly with moderate left ventricular dilation and superimposed mild left ventricular hypertrophy. No pericardial effusion. Left subclavian single lead pacemaker is seen with its lead within the right ventricle. Right internal jugular chest port tip is seen within the right atrium. The thoracic aorta demonstrates mild atherosclerotic calcification, but is otherwise unremarkable. Mediastinum/Nodes: Thyroid unremarkable. Pathologic pre-vascular mediastinal adenopathy is again identified exact measurement is difficult due to streak artifact from overlying pacemaker, however, the index lymph node demonstrates progressive enlargement, measuring 2.0 x 2.8 cm at axial image # 47/5. Right hilar adenopathy has  progressed with the index lymph node within the right hilum measuring 1.7 x 2.1 cm at axial image # 63/5. thyroid unremarkable. Esophagus unremarkable. Lungs/Pleura: Severe, end-stage cystic lung disease within the left apex with associated left-sided volume loss is again identified with probable 5 cm aspergillosis a again identified at the left apex. Moderate severe centrilobular paraseptal emphysema. There is interval development of diffuse ground-glass pulmonary infiltrate throughout the residual pulmonary parenchyma and new consolidation at the right lung base subpleurally, likely infectious in the appropriate clinical setting. Alternatively, diffuse infiltrate could relate to pulmonary edema with consolidation at the right lung base representing superimposed infection should or changes of radiation pneumonitis in the appropriate clinical setting. Pleural base mass at the right lung base demonstrates interval increase in size measuring 3.7 x 4.4 cm at axial image # 99/11 Upper Abdomen: Extensive reflux of contrast into the hepatic venous system secondary to at least some degree of right heart failure. No acute abnormality. Musculoskeletal: No acute bone abnormality. Review of the MIP images confirms the above findings. IMPRESSION: Interval development of diffuse ground-glass pulmonary infiltrate and right basilar subpleural consolidation. This may relate to diffuse infection or, more likely, diffuse pulmonary edema with superimposed infectious or post radiation change at the right lung base. 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 Superimposed moderate emphysema. Interval disease progression with enlargement of the neoplastic mass at the right lung base and developing right hilar adenopathy. Progressive pre-vascular adenopathy of unclear etiology. Moderate left ventricular dilation. Reflux of contrast into the hepatic venous vasculature in keeping with at least some degree of  right heart failure. No pulmonary embolism. Aortic Atherosclerosis (ICD10-I70.0) and Emphysema (ICD10-J43.9). Electronically Signed   By: Fidela Salisbury MD   On: 03/30/2020 22:33   DG Chest Port 1 View  Result Date: 04/13/2020 CLINICAL DATA:  Dyspnea, lung cancer, pneumonia EXAM: PORTABLE CHEST 1 VIEW COMPARISON:  Multiple including 04/06/2020, 04/04/2020, and 03/18/2020 FINDINGS: Left-sided volume loss with multiple cystic lucencies are again identified at the left apex with superimposed consolidation at the left apex, likely corresponding to the aspirate the lung a noted on prior CT examination of 03/30/2020. Progressive coarse interstitial pulmonary infiltrate throughout the right lung, likely infectious in etiology. Tiny right pleural effusion now present. No pneumothorax. Medial right basilar spiculated mass is better visualized than on prior examination, likely corresponding to the primary malignancy. Right internal jugular chest port tip is unchanged within the superior cavoatrial junction. Moderate cardiomegaly with stenting of a saphenous vein bypass graft is again noted. Left subclavian single lead pacemaker defibrillator is unchanged. Median sternotomy has been performed. IMPRESSION: Progressive coarse interstitial infiltrate throughout the right lung most in keeping with progressive pneumonic infiltrate. 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 Stable spiculated neoplastic mass at the medial right lung base. Unchanged cardiomegaly. Electronically Signed   By: Fidela Salisbury MD   On: 04/13/2020 22:23   DG CHEST PORT 1 VIEW  Result Date: 04/04/2020 CLINICAL DATA:  Dyspnea, follow-up lung opacities, history of right lung cancer EXAM: PORTABLE CHEST 1 VIEW COMPARISON:  03/29/2020 chest radiograph. FINDINGS: Right internal jugular Port-A-Cath terminates over the cavoatrial junction. Stable configuration of  single lead left subclavian ICD. Intact median sternotomy wires. Stable cardiomediastinal silhouette with mild cardiomegaly. No pneumothorax. Chronic bilateral costophrenic angle blunting. Chronic dense consolidation, volume loss and distortion in upper left lung with associated bronchiectasis and chronic mycetoma as seen on prior chest CT. Patchy opacity throughout both lungs, slightly worsened in the upper right lung and improved at the right lung base. Known spiculated right retrocardiac mass. IMPRESSION: 1. Patchy opacity throughout both lungs, slightly worsened in the upper right lung and improved at the right lung base, favor multifocal pneumonia, with a component of pulmonary edema not excluded. 2. Known spiculated right retrocardiac lung mass. 3. Chronic dense consolidation, volume loss and distortion in the upper left lung with associated bronchiectasis and chronic mycetoma as seen on prior chest CT. Electronically Signed   By: Ilona Sorrel M.D.   On: 04/04/2020 15:58    Orson Eva, DO  [facility]  If 7PM-7AM, please contact night-coverage [email or web address] Password [email or web address] 04/18/2020, 2:50 PM   LOS: 4 days

## 2020-04-19 DIAGNOSIS — J441 Chronic obstructive pulmonary disease with (acute) exacerbation: Secondary | ICD-10-CM | POA: Diagnosis not present

## 2020-04-19 DIAGNOSIS — C3491 Malignant neoplasm of unspecified part of right bronchus or lung: Secondary | ICD-10-CM | POA: Diagnosis not present

## 2020-04-19 DIAGNOSIS — R7989 Other specified abnormal findings of blood chemistry: Secondary | ICD-10-CM | POA: Diagnosis not present

## 2020-04-19 DIAGNOSIS — J9621 Acute and chronic respiratory failure with hypoxia: Secondary | ICD-10-CM | POA: Diagnosis not present

## 2020-04-19 LAB — BASIC METABOLIC PANEL
Anion gap: 11 (ref 5–15)
BUN: 48 mg/dL — ABNORMAL HIGH (ref 6–20)
CO2: 31 mmol/L (ref 22–32)
Calcium: 9.4 mg/dL (ref 8.9–10.3)
Chloride: 92 mmol/L — ABNORMAL LOW (ref 98–111)
Creatinine, Ser: 1.62 mg/dL — ABNORMAL HIGH (ref 0.61–1.24)
GFR, Estimated: 49 mL/min — ABNORMAL LOW (ref >60)
Glucose, Bld: 289 mg/dL — ABNORMAL HIGH (ref 70–99)
Potassium: 4 mmol/L (ref 3.5–5.1)
Sodium: 134 mmol/L — ABNORMAL LOW (ref 135–145)

## 2020-04-19 LAB — CBC
HCT: 30.3 % — ABNORMAL LOW (ref 39.0–52.0)
Hemoglobin: 9.3 g/dL — ABNORMAL LOW (ref 13.0–17.0)
MCH: 29.2 pg (ref 26.0–34.0)
MCHC: 30.7 g/dL (ref 30.0–36.0)
MCV: 95 fL (ref 80.0–100.0)
Platelets: 363 10*3/uL (ref 150–400)
RBC: 3.19 MIL/uL — ABNORMAL LOW (ref 4.22–5.81)
RDW: 19.5 % — ABNORMAL HIGH (ref 11.5–15.5)
WBC: 12 10*3/uL — ABNORMAL HIGH (ref 4.0–10.5)
nRBC: 3.2 % — ABNORMAL HIGH (ref 0.0–0.2)

## 2020-04-19 LAB — MAGNESIUM: Magnesium: 1.7 mg/dL (ref 1.7–2.4)

## 2020-04-19 MED ORDER — TORSEMIDE 20 MG PO TABS
40.0000 mg | ORAL_TABLET | Freq: Two times a day (BID) | ORAL | Status: DC
  Administered 2020-04-19 – 2020-04-20 (×3): 40 mg via ORAL
  Filled 2020-04-19 (×3): qty 2

## 2020-04-19 NOTE — Progress Notes (Signed)
PROGRESS NOTE  [name] HQI:[id number] DOB: May 10, 1961 DOA: 04/13/2020 PCP: [name], MD Brief History: 59 y.o.malepast medical history significant for coronary artery disease status post CABG, ischemic cardiomyopathy with an EF of 20% status post AICD placement, chronic hypoxic respiratory failure secondary to COPD on 4 L of oxygen, stage Ia non-small cell cancer diagnosed in February 2015 status post right lower lobe wedge resection as well as SBRT with a recurrent disease to the right lower lobe in June 2019 which has been in observation since then. Stage T1 adenocarcinoma of the prostate with a Gleason score of 4 diagnosed in November 2019 with a confirmed PET scan and biopsy of the right lower mass consistent with adenocarcinoma currently on chemotherapy, chemotherapy-induced anemia status post transfusion. Patient presented secondary to dyspnea.Patient was recently hospitalized from 03/18/2020 to 04/10/2020 during which she had acute on chronic respiratory failure secondary to pneumonia as well as acute on chronic systolic and diastolic CHF. The patient was diuresed with intravenous furosemide and treated with antibiotics. He was stabilized and subsequently discharged to skilled nursing facility with hospice care. He was seen by palliative medicine at that time and felt that he was a poor candidate to resume any chemotherapy. Subsequently, the patient's AICD was deactivated on 04/04/2020. The patient was discharged to [facility] up with palliative services. Unfortunately, the patient represented back to the emergency department on 04/13/2020 secondary to worsening shortness of breath with increasing oxygen requirement. Chest x-ray showed progressive coarsening of his interstitial opacities with stable left-sided volume loss and consolidation in the left apex. Initially, the patient voiced that he did not want any antibiotics or aggressive therapy. However after further  discussions with palliative medicine as well as the medicine team, the patient decided that he wanted to continue therapy at this time.  Assessment/Plan: Acute on chronic respiratory failure with hypoxia -Patient was discharged to SNF on 5 L. -Currently on 7 L nasal cannula>>6L -Secondary to pulmonary edema, COPD exacerbation, pneumonia, and possibly radiation pneumonitis and lymphangitic spread of his lung cancer. -11/18--CTA chest--no PE; LUL consolidatative changes with a large cavitary lesion with intracavitary debris Or fungal ball, RLL subpleural mass extending into hilum with a new spiculated mass in RLL since prior CT, C7 lytic lesion -unable to really wean down oxygen  Lobar pneumonia -Conitnuecefepime -MRSA screen--neg -d/c vanc  Acute on chronic combined systolic and diastolic CHF -13/24/4010 echo EF<20%, moderate to severe dilated LV, severe diffuse HK -Restartedfurosemide 80 mg IV twice daily -Continue metoprolol succinate for now -Entresto and carvedilol--could not tolerate previously due to hypotension -cardiology input appreciated-->long term prognosis is very poor and there is no cardiac intervention that will meaningfully improve that. Can continue IV lasix;  When converted to oral diuretic would change to torsemide 40mg  bid -plan to start torsemide 40 mg bid 04/19/20  COPD exacerbation -continuePulmicort -continueduo nebs -continueIV Solu-Medrol -add brovana   AKI -due to diuresis, possibly recent contrast -baseline creatinine 0.7-1.0 -serum creatinine peaking 1.68 -torsemide 40 mg bid  SVT -having intermittent SVT -BP will not likely tolerate titration of metoprolol -digoxin was started, careful with renal dysfunction  Adenocarcinoma of the right lung Progressive disease. Not a candidate for chemotherapy secondary to severe deconditioning and heart failure(03/16/20 note). Palliative care consulted -Patient is followed by Dr. [name] and  [name] receiving systemic chemotherapy every 3 weeks  Sepsis --present on admission -due to pneumonia --initiallypatient didnot want abx or intervention --now wants work upand abx --PCT  16.26  Prostate adenocarcinoma -Diagnosed in November 2019. Patient did have he is currently being followed by Dr. [name] seed implants.  Hyponatremia --secondaryfluid overload, possibly low solute intake --some improvement with diuresis  Acute on chronic Anemia of chronic disease --no bleeding reported. --O97 353 --folic 29.9 --iron saturation 29/ferritin 1010 -04/18/20--transfused 2 units PRBC with lasix IV x 1 in between 2 units  -Hg up to 9.   Goals of care -Palliative medicine is following -Patient states that he wants continued medical treatment including antibiotics and diuresis for now -04/18/20--GOC discussion with mother and sister in room-->discussed current difficulty placing pt in SNF in [place] in part due to high oxygen demand.  Discussed continuing current care in hopes of improvement for d/c to SNF versus transitioning to full comfort and trying to pursue residential hospice in [place].  I reached out to family 11/21 and not able to get anyone.  Attempt again 11/22.   Status is: Inpatient  Remains inpatient appropriate because:IV treatments appropriate due to intensity of illness or inability to take PO   Dispo: The patient is from:Home Anticipated d/c is [facility] Anticipated d/c date is: 1-2 days Patient currently is not medically stable to d/c.   Family Communication:no family present, multiple trips to room to see family made.    Consultants:Palliative medicine  Code Status: DNR  DVT Prophylaxis: [place] Heparin    Procedures: As Listed in Progress Note Above  Antibiotics: Vanco 11/17>>11/19 Cefepime 11/17>>  Total time spent 36 minutes.  Greater than 50% spent face to  face counseling and coordinating care.  Subjective: Patient somnolent but verbalizes no specific complaint.   Objective: Vitals:   04/19/20 0737 04/19/20 0756 04/19/20 0802 04/19/20 0809  BP: 116/76     Pulse: 81     Resp: 16     Temp: 98.6 F (37 C)     TempSrc:      SpO2: 97% 95% 98% 98%  Weight:      Height:        Intake/Output Summary (Last 24 hours) at 04/19/2020 1352 Last data filed at 04/19/2020 1018 Gross per 24 hour  Intake 2310 ml  Output 2000 ml  Net 310 ml   Weight change:  Exam:   General:  Pt chronically ill appearing and emaciated, follows commands appropriately, not in acute distress  HEENT: neck supple, no JVD, no masses or lymphadenopathy  Cardiovascular: normal S1/S2 sounds, no rubs, no gallops  Respiratory: poor air movement, diffuse crackles and exp wheezes heard.  Abdomen: ND/NT, no mass palpated.   Extremities: no C/C/E.   Neuro: nonfocal exam.  Data Reviewed: I have personally reviewed following labs and imaging studies Basic Metabolic Panel: Recent Labs  Lab 04/13/20 2148 04/16/20 0450 04/17/20 0505 04/18/20 0637 04/19/20 0928  NA 130* 131* 131* 132* 134*  K 4.1 4.4 4.7 4.7 4.0  CL 85* 86* 87* 90* 92*  CO2 36* 34* 32 33* 31  GLUCOSE 131* 184* 200* 173* 289*  BUN 30* 41* 51* 60* 48*  CREATININE 0.97 1.08 1.49* 1.68* 1.62*  CALCIUM 8.8* 9.3 9.8 9.5 9.4  MG  --   --   --  1.7 1.7   Liver Function Tests: Recent Labs  Lab 04/13/20 2148 04/17/20 0505  AST 11* 13*  ALT 12 12  ALKPHOS 69 98  BILITOT 0.4 0.3  PROT 7.3 7.7  ALBUMIN 2.6* 2.7*   No results for input(s): LIPASE, AMYLASE in the last 168 hours. No results for input(s): AMMONIA  in the last 168 hours. Coagulation Profile: No results for input(s): INR, PROTIME in the last 168 hours. CBC: Recent Labs  Lab 04/13/20 2148 04/16/20 0450 04/17/20 0505 04/18/20 0637 04/19/20 0928  WBC 10.8* 10.5 13.9* 14.3* 12.0*  NEUTROABS 8.1*  --   --   --   --   HGB 7.4*  7.1* 7.1* 6.7* 9.3*  HCT 23.4* 23.6* 23.4* 21.8* 30.3*  MCV 93.2 95.2 96.3 97.3 95.0  PLT 361 440* 448* 403* 363   Cardiac Enzymes: No results for input(s): CKTOTAL, CKMB, CKMBINDEX, TROPONINI in the last 168 hours. BNP: Invalid input(s): POCBNP CBG: No results for input(s): GLUCAP in the last 168 hours. HbA1C: No results for input(s): HGBA1C in the last 72 hours. Urine analysis:    Component Value Date/Time   COLORURINE YELLOW 04/14/2020 [place] 04/14/2020 0450   LABSPEC 1.013 04/14/2020 0450   PHURINE 5.0 04/14/2020 0450   GLUCOSEU NEGATIVE 04/14/2020 0450   HGBUR NEGATIVE 04/14/2020 0450   BILIRUBINUR NEGATIVE 04/14/2020 0450   KETONESUR NEGATIVE 04/14/2020 0450   PROTEINUR NEGATIVE 04/14/2020 0450   UROBILINOGEN 0.2 08/06/2013 1436   NITRITE NEGATIVE 04/14/2020 0450   LEUKOCYTESUR NEGATIVE 04/14/2020 0450   Recent Results (from the past 240 hour(s))  Resp Panel by RT PCR (RSV, Flu A&B, Covid) - Nasopharyngeal Swab     Status: None   Collection Time: 04/13/20  9:48 PM   Specimen: Nasopharyngeal Swab  Result Value Ref Range Status   SARS Coronavirus 2 by RT PCR NEGATIVE NEGATIVE Final    Comment: (NOTE) SARS-CoV-2 target nucleic acids are NOT DETECTED.  The SARS-CoV-2 RNA is generally detectable in upper respiratoy specimens during the acute phase of infection. The lowest concentration of SARS-CoV-2 viral copies this assay can detect is 131 copies/mL. A negative result does not preclude SARS-Cov-2 infection and should not be used as the sole basis for treatment or other patient management decisions. A negative result may occur with  improper specimen collection/handling, submission of specimen other than nasopharyngeal swab, presence of viral mutation(s) within the areas targeted by this assay, and inadequate number of viral copies (<131 copies/mL). A negative result must be combined with clinical observations, patient history, and epidemiological  information. The expected result is Negative.  Fact Sheet for Patients:  PinkCheek.be  Fact Sheet for Healthcare Providers:  GravelBags.it  This test is no t yet approved or cleared by the [place] FDA and  has been authorized for detection and/or diagnosis of SARS-CoV-2 by FDA under an Emergency Use Authorization (EUA). This EUA will remain  in effect (meaning this test can be used) for the duration of the COVID-19 declaration under Section 564(b)(1) of the Act, 21 U.S.C. section 360bbb-3(b)(1), unless the authorization is terminated or revoked sooner.     Influenza A by PCR NEGATIVE NEGATIVE Final   Influenza B by PCR NEGATIVE NEGATIVE Final    Comment: (NOTE) The Xpert Xpress SARS-CoV-2/FLU/RSV assay is intended as an aid in  the diagnosis of influenza from Nasopharyngeal swab specimens and  should not be used as a sole basis for treatment. Nasal washings and  aspirates are unacceptable for Xpert Xpress SARS-CoV-2/FLU/RSV  testing.  Fact Sheet for Patients: PinkCheek.be  Fact Sheet for Healthcare Providers: GravelBags.it  This test is not yet approved or cleared by the [place] FDA and  has been authorized for detection and/or diagnosis of SARS-CoV-2 by  FDA under an Emergency Use Authorization (EUA). This EUA will remain  in effect (meaning  this test can be used) for the duration of the  Covid-19 declaration under Section 564(b)(1) of the Act, 21  U.S.C. section 360bbb-3(b)(1), unless the authorization is  terminated or revoked.    Respiratory Syncytial Virus by PCR NEGATIVE NEGATIVE Final    Comment: (NOTE) Fact Sheet for Patients: PinkCheek.be  Fact Sheet for Healthcare Providers: GravelBags.it  This test is not yet approved or cleared by the [place] FDA and  has been  authorized for detection and/or diagnosis of SARS-CoV-2 by  FDA under an Emergency Use Authorization (EUA). This EUA will remain  in effect (meaning this test can be used) for the duration of the  COVID-19 declaration under Section 564(b)(1) of the Act, 21 U.S.C.  section 360bbb-3(b)(1), unless the authorization is terminated or  revoked. Performed at [facility], [address] [place], [place] [address]   MRSA PCR Screening     Status: None   Collection Time: 04/16/20 11:31 AM   Specimen: Nasal Mucosa; Nasopharyngeal  Result Value Ref Range Status   MRSA by PCR NEGATIVE NEGATIVE Final    Comment:        The GeneXpert MRSA Assay (FDA approved for NASAL specimens only), is one component of a comprehensive MRSA colonization surveillance program. It is not intended to diagnose MRSA infection nor to guide or monitor treatment for MRSA infections. Performed at [facility], [address] [place], [place] [address]      Scheduled Meds: . sodium chloride   Intravenous Once  . arformoterol  15 mcg Nebulization BID  . budesonide (PULMICORT) nebulizer solution  0.5 mg Nebulization BID  . Chlorhexidine Gluconate Cloth  6 each Topical Daily  . digoxin  0.125 mg Oral Daily  . feeding supplement  237 mL Oral BID BM  . folic acid  1 mg Oral Daily  . heparin  5,000 Units Subcutaneous Q8H  . ipratropium-albuterol  3 mL Nebulization Q6H  . methylPREDNISolone (SOLU-MEDROL) injection  60 mg Intravenous Q12H  . metoprolol succinate  25 mg Oral Daily   Continuous Infusions: . ceFEPime (MAXIPIME) IV 2 g (04/19/20 0846)    Procedures/Studies: DG Chest 1 View  Result Date: 04/06/2020 CLINICAL DATA:  59 year old male with shortness of breath. EXAM: CHEST  1 VIEW COMPARISON:  Chest radiograph dated 04/04/2020. FINDINGS: Right-sided Port-A-Cath with tip at the cavoatrial junction. Diffuse chronic interstitial coarsening as seen on the prior radiograph. No new consolidation. No large pleural  effusion or pneumothorax. Thickening of the left upper lobe pleural surface. Left upper lobe cavitary lesion with peripheral air. There is stable cardiomegaly. Left pectoral AICD device. Median sternotomy wires and coronary stent. No acute osseous pathology. IMPRESSION: 1. No new consolidation. Overall no significant interval change in the appearance of the lungs compared to the prior radiograph. 2. Left upper lobe cavitary lesion with peripheral air. 3. Stable cardiomegaly. Electronically Signed   By: Anner Crete M.D.   On: 04/06/2020 22:45   DG Chest 1 View  Result Date: 03/29/2020 CLINICAL DATA:  Short of breath.  History of lung carcinoma. EXAM: CHEST  1 VIEW COMPARISON:  03/18/2020 and earlier exams. FINDINGS: Stable changes from previous cardiac surgery. Left coronary artery stents. Masslike opacity superimposed over the right hilum is stable. No convincing mediastinal masses. Left hemithorax volume loss with mediastinal shift to the left. There is consolidation in the left lung most apparent in the upper lung, as well as linear/reticular type opacities, stable from the prior study. On the right, patchy airspace opacity is noted  in the mid and lower lung superimposed on chronic interstitial thickening. The lung is hyperexpanded. No pneumothorax. Stable left anterior chest wall AICD and right anterior chest wall Port-A-Cath. IMPRESSION: 1. New patchy airspace opacity in the right mid to lower lung superimposed on chronic interstitial thickening. New lung opacities are suspicious for superimposed pneumonia. 2. No other change from the prior exam. Extensive chronic changes as detailed. Electronically Signed   By: Lajean Manes M.D.   On: 03/29/2020 08:39   CT ANGIO CHEST PE W OR WO CONTRAST  Result Date: 04/16/2020 CLINICAL DATA:  59 year old male with history of metastatic lung cancer presenting with shortness of breath. Concern for pulmonary embolism. EXAM: CT ANGIOGRAPHY CHEST WITH CONTRAST  TECHNIQUE: Multidetector CT imaging of the chest was performed using the standard protocol during bolus administration of intravenous contrast. Multiplanar CT image reconstructions and MIPs were obtained to evaluate the vascular anatomy. CONTRAST:  20mL OMNIPAQUE IOHEXOL 350 MG/ML SOLN COMPARISON:  Chest CT dated 03/30/2020. FINDINGS: Cardiovascular: Mild to moderate cardiomegaly. No pericardial effusion. Mild atherosclerotic calcification of the thoracic aorta. No aneurysmal dilatation. No acute pulmonary artery embolus identified. Mediastinum/Nodes: Hilar and mediastinal adenopathy as seen on the prior CT. Right hilar lymph node measures 19 mm short axis. A lymph node anterior to the right mainstem bronchus measures 2 cm in short axis. Clusters of lymph nodes in the prevascular space as seen previously. There are supraclavicular adenopathy. No mediastinal fluid collection. Lungs/Pleura: There is consolidative changes of the left upper lobe with a large cavitary lesion similar to prior CT. Diffuse interstitial coarsening and background of emphysema. Right upper lobe scarring and a 3.9 x 3.9 cm subpleural mass in the medial right lower lobe extending into the right hilum as seen previously. There is 3.6 x 3.7 cm mass with irregular/spiculated margin in the right lower lobe (97/6) which is new since the prior CT. Trace bilateral pleural effusions. There is no pneumothorax. The central airways are patent. Upper Abdomen: No acute abnormality. Musculoskeletal: Lytic destructive metastasis involving C7, seen on the prior CT. No new osseous metastasis. No acute osseous pathology. Median sternotomy wires. There is loss of subcutaneous fat and cachexia. Review of the MIP images confirms the above findings. IMPRESSION: 1. No CT evidence of acute pulmonary artery embolus. 2. Consolidative changes of the left upper lobe with a large cavitary lesion containing intracavitary debris or fungal ball similar to prior CT. 3. Right  lower lobe subpleural mass extending into the right hilum as seen previously. 4. An irregular/spiculated mass in the right lower lobe, new since the prior CT. 5. Lytic destructive metastasis involving C7, seen on the prior CT. No new osseous metastasis. 6. Trace bilateral pleural effusions. 7. Aortic Atherosclerosis (ICD10-I70.0) and Emphysema (ICD10-J43.9). Electronically Signed   By: Anner Crete M.D.   On: 04/16/2020 17:42   CT ANGIO CHEST PE W OR WO CONTRAST  Result Date: 03/30/2020 CLINICAL DATA:  Pulmonary fibrosis, lung cancer, dyspnea EXAM: CT ANGIOGRAPHY CHEST WITH CONTRAST TECHNIQUE: Multidetector CT imaging of the chest was performed using the standard protocol during bolus administration of intravenous contrast. Multiplanar CT image reconstructions and MIPs were obtained to evaluate the vascular anatomy. CONTRAST:  129mL OMNIPAQUE IOHEXOL 350 MG/ML SOLN COMPARISON:  01/06/2020 FINDINGS: Cardiovascular: There is excellent opacification of the pulmonary arterial tree. There is no intraluminal filling defect identified to suggest acute pulmonary embolism. The central pulmonary arteries are enlarged in keeping with changes of pulmonary arterial hypertension. Coronary artery bypass grafting has been performed with stenting  within a left anterior descending saphenous vein graft. There is mild global cardiomegaly with moderate left ventricular dilation and superimposed mild left ventricular hypertrophy. No pericardial effusion. Left subclavian single lead pacemaker is seen with its lead within the right ventricle. Right internal jugular chest port tip is seen within the right atrium. The thoracic aorta demonstrates mild atherosclerotic calcification, but is otherwise unremarkable. Mediastinum/Nodes: Thyroid unremarkable. Pathologic pre-vascular mediastinal adenopathy is again identified exact measurement is difficult due to streak artifact from overlying pacemaker, however, the index lymph node  demonstrates progressive enlargement, measuring 2.0 x 2.8 cm at axial image # 47/5. Right hilar adenopathy has progressed with the index lymph node within the right hilum measuring 1.7 x 2.1 cm at axial image # 63/5. thyroid unremarkable. Esophagus unremarkable. Lungs/Pleura: Severe, end-stage cystic lung disease within the left apex with associated left-sided volume loss is again identified with probable 5 cm aspergillosis a again identified at the left apex. Moderate severe centrilobular paraseptal emphysema. There is interval development of diffuse ground-glass pulmonary infiltrate throughout the residual pulmonary parenchyma and new consolidation at the right lung base subpleurally, likely infectious in the appropriate clinical setting. Alternatively, diffuse infiltrate could relate to pulmonary edema with consolidation at the right lung base representing superimposed infection should or changes of radiation pneumonitis in the appropriate clinical setting. Pleural base mass at the right lung base demonstrates interval increase in size measuring 3.7 x 4.4 cm at axial image # 99/11 Upper Abdomen: Extensive reflux of contrast into the hepatic venous system secondary to at least some degree of right heart failure. No acute abnormality. Musculoskeletal: No acute bone abnormality. Review of the MIP images confirms the above findings. IMPRESSION: Interval development of diffuse ground-glass pulmonary infiltrate and right basilar subpleural consolidation. This may relate to diffuse infection or, more likely, diffuse pulmonary edema with superimposed infectious or post radiation change at the right lung base. 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 Superimposed moderate emphysema. Interval disease progression with enlargement of the neoplastic mass at the right lung base and developing right hilar adenopathy. Progressive pre-vascular adenopathy of unclear etiology. Moderate left  ventricular dilation. Reflux of contrast into the hepatic venous vasculature in keeping with at least some degree of right heart failure. No pulmonary embolism. Aortic Atherosclerosis (ICD10-I70.0) and Emphysema (ICD10-J43.9). Electronically Signed   By: Fidela Salisbury MD   On: 03/30/2020 22:33   DG Chest Port 1 View  Result Date: 04/13/2020 CLINICAL DATA:  Dyspnea, lung cancer, pneumonia EXAM: PORTABLE CHEST 1 VIEW COMPARISON:  Multiple including 04/06/2020, 04/04/2020, and 03/18/2020 FINDINGS: Left-sided volume loss with multiple cystic lucencies are again identified at the left apex with superimposed consolidation at the left apex, likely corresponding to the aspirate the lung a noted on prior CT examination of 03/30/2020. Progressive coarse interstitial pulmonary infiltrate throughout the right lung, likely infectious in etiology. Tiny right pleural effusion now present. No pneumothorax. Medial right basilar spiculated mass is better visualized than on prior examination, likely corresponding to the primary malignancy. Right internal jugular chest port tip is unchanged within the superior cavoatrial junction. Moderate cardiomegaly with stenting of a saphenous vein bypass graft is again noted. Left subclavian single lead pacemaker defibrillator is unchanged. Median sternotomy has been performed. IMPRESSION: Progressive coarse interstitial infiltrate throughout the right lung most in keeping with progressive pneumonic infiltrate. 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 Stable spiculated neoplastic mass at the medial right lung  base. Unchanged cardiomegaly. Electronically Signed   By: Fidela Salisbury MD   On: 04/13/2020 22:23   DG CHEST PORT 1 VIEW  Result Date: 04/04/2020 CLINICAL DATA:  Dyspnea, follow-up lung opacities, history of right lung cancer EXAM: PORTABLE CHEST 1 VIEW COMPARISON:  03/29/2020 chest  radiograph. FINDINGS: Right internal jugular Port-A-Cath terminates over the cavoatrial junction. Stable configuration of single lead left subclavian ICD. Intact median sternotomy wires. Stable cardiomediastinal silhouette with mild cardiomegaly. No pneumothorax. Chronic bilateral costophrenic angle blunting. Chronic dense consolidation, volume loss and distortion in upper left lung with associated bronchiectasis and chronic mycetoma as seen on prior chest CT. Patchy opacity throughout both lungs, slightly worsened in the upper right lung and improved at the right lung base. Known spiculated right retrocardiac mass. IMPRESSION: 1. Patchy opacity throughout both lungs, slightly worsened in the upper right lung and improved at the right lung base, favor multifocal pneumonia, with a component of pulmonary edema not excluded. 2. Known spiculated right retrocardiac lung mass. 3. Chronic dense consolidation, volume loss and distortion in the upper left lung with associated bronchiectasis and chronic mycetoma as seen on prior chest CT. Electronically Signed   By: Ilona Sorrel M.D.   On: 04/04/2020 15:58    Kaedon Fanelli Wynetta Emery, MD  How to contact the [facility] Attending or Consulting provider [place] or covering provider during after hours [place], for this patient?  1. Check the care team in [facility] and look for a) attending/consulting TRH provider listed and b) the [facility] team listed 2. Log into [email or web address] and use [place]'s universal password to access. If you do not have the password, please contact the hospital operator. 3. Locate the [facility] provider you are looking for under [facility] and page to a number that you can be directly reached. 4. If you still have difficulty reaching the provider, please page the [facility] (Director on Call) for the Hospitalists listed on amion for assistance.   If 7PM-7AM, please contact night-coverage [email or web address] Password [email or web address] 04/19/2020, 1:52 PM   LOS: 5 days

## 2020-04-20 DIAGNOSIS — Z7189 Other specified counseling: Secondary | ICD-10-CM | POA: Diagnosis not present

## 2020-04-20 DIAGNOSIS — J9621 Acute and chronic respiratory failure with hypoxia: Secondary | ICD-10-CM | POA: Diagnosis not present

## 2020-04-20 DIAGNOSIS — R7989 Other specified abnormal findings of blood chemistry: Secondary | ICD-10-CM | POA: Diagnosis not present

## 2020-04-20 DIAGNOSIS — Z515 Encounter for palliative care: Secondary | ICD-10-CM | POA: Diagnosis not present

## 2020-04-20 DIAGNOSIS — C3491 Malignant neoplasm of unspecified part of right bronchus or lung: Secondary | ICD-10-CM | POA: Diagnosis not present

## 2020-04-20 LAB — BASIC METABOLIC PANEL
Anion gap: 9 (ref 5–15)
BUN: 45 mg/dL — ABNORMAL HIGH (ref 6–20)
CO2: 36 mmol/L — ABNORMAL HIGH (ref 22–32)
Calcium: 9.6 mg/dL (ref 8.9–10.3)
Chloride: 92 mmol/L — ABNORMAL LOW (ref 98–111)
Creatinine, Ser: 1.15 mg/dL (ref 0.61–1.24)
GFR, Estimated: >60 mL/min (ref >60)
Glucose, Bld: 118 mg/dL — ABNORMAL HIGH (ref 70–99)
Potassium: 4.1 mmol/L (ref 3.5–5.1)
Sodium: 137 mmol/L (ref 135–145)

## 2020-04-20 LAB — TYPE AND SCREEN
ABO/RH(D): O POS
Antibody Screen: NEGATIVE
Unit division: 0
Unit division: 0

## 2020-04-20 LAB — BPAM RBC
Blood Product Expiration Date: 202112272359
Blood Product Expiration Date: 202112272359
ISSUE DATE / TIME: 202111202145
ISSUE DATE / TIME: 202111210356
Unit Type and Rh: 5100
Unit Type and Rh: 5100

## 2020-04-20 LAB — SARS CORONAVIRUS 2 BY RT PCR (HOSPITAL ORDER, PERFORMED IN ~~LOC~~ HOSPITAL LAB): SARS Coronavirus 2: NEGATIVE

## 2020-04-20 LAB — MAGNESIUM: Magnesium: 1.5 mg/dL — ABNORMAL LOW (ref 1.7–2.4)

## 2020-04-20 NOTE — Progress Notes (Signed)
Physical Therapy Treatment Patient Details Name: [name] MRN: [id number] DOB: Oct 25, 1960 Today's Date: 04/20/2020    History of Present Illness [name]  is a 59 y.o. male, with history of of childhood TB, MI, prostate cancer, lung cancer, COPD, CAD, CHF 2/2 ischemic cardiomyopathy and more presents to the ED with a chief complaint of dyspnea. Patient reports that it started at 6:00pm on 04/13/20. It was gradual in onset and associated with intermittent palpitations. Patient denies chest pain, nausea, diaphoresis, orthopnea, peripheral edema. Patient is not very forthcoming with details and reports that he is "about to die" and wishes that he would just "die and it would be over with." Patient was supposed to have hospice consult at SNF, but for unknown reasons that did not happen. Patient reports that when he started to feel short of breath, they turned his O2 up to 5L. When that did not relieve his symptoms he was sent to the ER.  Patient was recently admitted 10/20 - 11/12. He was supposed to be under hospice care at discharge. At that time he had been admitted for dyspnea x 2 weeks. At that time he had a twenty point weight gain associated with his dyspnea at that time. He was treated for acute on chronic combined systolic and diastolic heart failure, and at that time he required up to 15L HFNC. He was weaned down to 5L  per DC summary, but patient says he was actually able to get down to 4 at the SNF.    PT Comments    Patient requires repeated verbal/tactile cueing to follow directions, demonstrates slow labored movement for functional mobility and limited to a few steps at bedside due to generalized weakness and fatigue.  Patient able to transfer to [facility] to have a bowel movement and tolerated sitting up in chair after therapy - RN/NT notified.  Patient will benefit from continued physical therapy in hospital and recommended venue below to increase strength, balance, endurance for safe  ADLs and gait.    Follow Up Recommendations  SNF     Equipment Recommendations  Rolling walker with 5" wheels;3in1 (PT)    Recommendations for Other Services       Precautions / Restrictions Precautions Precautions: Fall Restrictions Weight Bearing Restrictions: No    Mobility  Bed Mobility Overal bed mobility: Independent;Modified Independent             General bed mobility comments: increased time  Transfers Overall transfer level: Needs assistance Equipment used: Rolling walker (2 wheeled);1 person hand held assist Transfers: Sit to/from [facility] Sit to Stand: Min assist Stand pivot transfers: Min assist;Mod assist       General transfer comment: slow labored movement, requires repeated verbal/tactile cueing to complete tasks  Ambulation/Gait Ambulation/Gait assistance: Mod assist;Min assist Gait Distance (Feet): 5 Feet Assistive device: Rolling walker (2 wheeled) Gait Pattern/deviations: Decreased step length - right;Decreased step length - left;Decreased stride length Gait velocity: decreased   General Gait Details: limited to 5-6 slow labored steps at bedside due to generalized weakness and fatigue   Stairs             Wheelchair Mobility    Modified Rankin (Stroke Patients Only)       Balance Overall balance assessment: Needs assistance Sitting-balance support: Feet supported;No upper extremity supported Sitting balance-Leahy Scale: Good Sitting balance - Comments: seated at EOB   Standing balance support: During functional activity;No upper extremity supported Standing balance-Leahy Scale: Poor Standing balance comment: fair/poor, fair using  RW                            Cognition Arousal/Alertness: Awake/alert Behavior During Therapy: Flat affect Overall Cognitive Status: Within Functional Limits for tasks assessed                                        Exercises      General  Comments        Pertinent Vitals/Pain Pain Assessment: No/denies pain    Home Living                      Prior Function            PT Goals (current goals can now be found in the care plan section) Acute Rehab PT Goals Patient Stated Goal: Be able to return home with some assistance PT Goal Formulation: With patient Time For Goal Achievement: 04/22/20 Potential to Achieve Goals: Good Progress towards PT goals: Progressing toward goals    Frequency    Min 3X/week      PT Plan Current plan remains appropriate    Co-evaluation              AM-PAC PT "6 Clicks" Mobility   Outcome Measure  Help needed turning from your back to your side while in a flat bed without using bedrails?: None Help needed moving from lying on your back to sitting on the side of a flat bed without using bedrails?: None Help needed moving to and from a bed to a chair (including a wheelchair)?: A Little Help needed standing up from a chair using your arms (e.g., wheelchair or bedside chair)?: A Lot Help needed to walk in hospital room?: A Lot Help needed climbing 3-5 steps with a railing? : A Lot 6 Click Score: 17    End of Session Equipment Utilized During Treatment: Oxygen Activity Tolerance: Patient tolerated treatment well;Patient limited by fatigue Patient left: in chair;with call bell/phone within reach;with chair alarm set Nurse Communication: Mobility status PT Visit Diagnosis: Unsteadiness on feet (R26.81);Other abnormalities of gait and mobility (R26.89);Muscle weakness (generalized) (M62.81)     Time: 1505-6979 PT Time Calculation (min) (ACUTE ONLY): 31 min  Charges:  $Therapeutic Exercise: 8-22 mins $Therapeutic Activity: 8-22 mins                     12:26 PM, 04/20/20 Lonell Grandchild, MPT Physical Therapist with [facility] [phone] office [phone] mobile phone

## 2020-04-20 NOTE — Progress Notes (Signed)
Palliative: [name] is resting quietly in bed.  He appears acutely/chronically ill and very frail.  He will make an somewhat keep eye contact, is oriented.  He is able to make his needs known.  There is no family at bedside at this time.  We talked about what is next for [name].  I share that I had read in his notes that over the weekend he was considering residential hospice with [facility].  He nods affirmatively.  I share that if he is ready, I will make a referral to [facility] residential hospice.  I share my concern that the longer he waits, the more unstable he may become.  I share that if he is too unstable to transfer, he may have to stay here in [facility] far away from his family.  [name] agrees to referral to [facility] residential hospice in order to be close to his family as he is nearing end-of-life.  Conference with attending, bedside nursing staff, transition of care team related to patient condition, needs, goals of care, disposition to residential hospice.  DNR/goldenrod form is on chart.  Plan: Requesting comfort and dignity at end-of-life, residential hospice with [facility] so that he may be closer to family. Prognosis: 2 weeks or less is anticipated due to advancing cancer, frailty, respiratory status.  50 minutes Quinn Axe, NP Palliative Medicine Team Team Phone # [phone] Greater than 50% of this time was spent counseling and coordinating care related to the above assessment and plan.

## 2020-04-20 NOTE — Discharge Summary (Addendum)
Physician Discharge Summary  [name] XFG:[id number] DOB: [birth year] DOA: 04/13/2020  PCP: [name], MD  Admit date: 04/13/2020 Discharge date: 04/20/2020  Disposition: [facility]  Recommendations for Outpatient Follow-up:  1. SYMPTOM MANAGEMENT PER HOSPICE PROTOCOLS   Discharge Condition: HOSPICE    CODE STATUS: DNR    Brief Hospitalization Summary: Please see all hospital notes, images, labs for full details of the hospitalization. 59 y.o.malepast medical history significant for coronary artery disease status post CABG, ischemic cardiomyopathy with an EF of 20% status post AICD placement, chronic hypoxic respiratory failure secondary to COPD on 4 L of oxygen, stage Ia non-small cell cancer diagnosed in February 2015 status post right lower lobe wedge resection as well as SBRT with a recurrent disease to the right lower lobe in June 2019 which has been in observation since then. Stage T1 adenocarcinoma of the prostate with a Gleason score of 4 diagnosed in November 2019 with a confirmed PET scan and biopsy of the right lower mass consistent with adenocarcinoma currently on chemotherapy, chemotherapy-induced anemia status post transfusion. Patient presented secondary to dyspnea.Patient was recently hospitalized from 03/18/2020 to 04/10/2020 during which she had acute on chronic respiratory failure secondary to pneumonia as well as acute on chronic systolic and diastolic CHF. The patient was diuresed with intravenous furosemide and treated with antibiotics. He was stabilized and subsequently discharged to skilled nursing facility with hospice care. He was seen by palliative medicine at that time and felt that he was a poor candidate to resume any chemotherapy. Subsequently, the patient's AICD was deactivated on 04/04/2020. The patient was discharged to [facility] up with palliative services. Unfortunately, the patient represented back to the emergency department on  04/13/2020 secondary to worsening shortness of breath with increasing oxygen requirement. Chest x-ray showed progressive coarsening of his interstitial opacities with stable left-sided volume loss and consolidation in the left apex. Initially, the patient voiced that he did not want any antibiotics or aggressive therapy. However after further discussions with palliative medicine as well as the medicine team, the patient decided that he wanted to continue therapy at this time.  Assessment/Plan: Acute on chronic respiratory failure with hypoxia -Patient was discharged to SNF on 5 L. -Currently on 7 L nasal cannula>>6L -Secondary to pulmonary edema, COPD exacerbation, pneumonia, and possibly radiation pneumonitis and lymphangitic spread of his lung cancer. -11/18--CTA chest--no PE;LUL consolidatative changes with a large cavitary lesion with intracavitary debris Or fungal ball, RLL subpleural mass extending into hilum with a new spiculated mass in RLL since prior CT, C7lytic lesion -unable to really wean down oxygen  Lobar pneumonia -treated with cefepime prior to transition to  -MRSA screen--neg -d/c vanc  Acute on chronic combined systolic and diastolic CHF -96/[id number]/9381 echo EF<20%, moderate to severe dilated LV, severe diffuse HK -he has been transitioned to full comfort care   COPD exacerbation -full comfort care   AKI resolved  SVT  Adenocarcinoma of the right lung Progressive disease. Not a candidate for chemotherapy secondary to severe deconditioning and heart failure(03/16/20 note). Palliative care consultedand full comfort care measures requested.   Sepsis Resolved   Prostate adenocarcinoma -Diagnosed in November 2019. Patient did have seed implants.  Hyponatremia --secondaryfluid overload, possibly low solute intake --some improvement with diuresis  Acute on chronic Anemia of chronic disease  Goals of care -Palliative medicine   Discharge  Diagnoses:  Principal Problem:   Acute on chronic respiratory failure (HCC) Active Problems:   Lung cancer (HCC)   COPD exacerbation ([place])  Goals of care, counseling/discussion   Adenocarcinoma of right lung, stage 4 (HCC)   Acute on chronic combined systolic (congestive) and diastolic (congestive) heart failure (HCC)   End of life care   Protein calorie malnutrition (HCC)   SIRS (systemic inflammatory response syndrome) (HCC)   Elevated brain natriuretic peptide (BNP) level   Encounter for hospice care discussion   Sepsis due to undetermined organism [facility])   Discharge Instructions:  Allergies as of 04/20/2020   No Known Allergies     Medication List    STOP taking these medications   acetaminophen 325 MG tablet Commonly known as: TYLENOL   ALPRAZolam 1 MG tablet Commonly known as: XANAX   diclofenac Sodium 1 % Gel Commonly known as: VOLTAREN   Fluticasone-Salmeterol 250-50 MCG/DOSE Aepb Commonly known as: Advair Diskus   folic acid 1 MG tablet Commonly known as: FOLVITE   furosemide 40 MG tablet Commonly known as: LASIX   ipratropium 0.02 % nebulizer solution Commonly known as: ATROVENT   levalbuterol 0.63 MG/3ML nebulizer solution Commonly known as: XOPENEX   lidocaine 5 % Commonly known as: LIDODERM   lidocaine-prilocaine cream Commonly known as: EMLA   metoprolol succinate 25 MG 24 hr tablet Commonly known as: TOPROL-XL   morphine CONCENTRATE 10 MG/0.5ML Soln concentrated solution   ondansetron 8 MG tablet Commonly known as: ZOFRAN   potassium chloride SA 20 MEQ tablet Commonly known as: KLOR-CON   prochlorperazine 10 MG tablet Commonly known as: COMPAZINE   Spiriva HandiHaler 18 MCG inhalation capsule Generic drug: tiotropium       No Known Allergies Allergies as of 04/20/2020   No Known Allergies     Medication List    STOP taking these medications   acetaminophen 325 MG tablet Commonly known as: TYLENOL   ALPRAZolam 1 MG  tablet Commonly known as: XANAX   diclofenac Sodium 1 % Gel Commonly known as: VOLTAREN   Fluticasone-Salmeterol 250-50 MCG/DOSE Aepb Commonly known as: Advair Diskus   folic acid 1 MG tablet Commonly known as: FOLVITE   furosemide 40 MG tablet Commonly known as: LASIX   ipratropium 0.02 % nebulizer solution Commonly known as: ATROVENT   levalbuterol 0.63 MG/3ML nebulizer solution Commonly known as: XOPENEX   lidocaine 5 % Commonly known as: LIDODERM   lidocaine-prilocaine cream Commonly known as: EMLA   metoprolol succinate 25 MG 24 hr tablet Commonly known as: TOPROL-XL   morphine CONCENTRATE 10 MG/0.5ML Soln concentrated solution   ondansetron 8 MG tablet Commonly known as: ZOFRAN   potassium chloride SA 20 MEQ tablet Commonly known as: KLOR-CON   prochlorperazine 10 MG tablet Commonly known as: COMPAZINE   Spiriva HandiHaler 18 MCG inhalation capsule Generic drug: tiotropium       Procedures/Studies: DG Chest 1 View  Result Date: 04/06/2020 CLINICAL DATA:  59 year old male with shortness of breath. EXAM: CHEST  1 VIEW COMPARISON:  Chest radiograph dated 04/04/2020. FINDINGS: Right-sided Port-A-Cath with tip at the cavoatrial junction. Diffuse chronic interstitial coarsening as seen on the prior radiograph. No new consolidation. No large pleural effusion or pneumothorax. Thickening of the left upper lobe pleural surface. Left upper lobe cavitary lesion with peripheral air. There is stable cardiomegaly. Left pectoral AICD device. Median sternotomy wires and coronary stent. No acute osseous pathology. IMPRESSION: 1. No new consolidation. Overall no significant interval change in the appearance of the lungs compared to the prior radiograph. 2. Left upper lobe cavitary lesion with peripheral air. 3. Stable cardiomegaly. Electronically Signed   By: Milas Hock  Radparvar M.D.   On: 04/06/2020 22:45   DG Chest 1 View  Result Date: 03/29/2020 CLINICAL DATA:  Short of  breath.  History of lung carcinoma. EXAM: CHEST  1 VIEW COMPARISON:  03/18/2020 and earlier exams. FINDINGS: Stable changes from previous cardiac surgery. Left coronary artery stents. Masslike opacity superimposed over the right hilum is stable. No convincing mediastinal masses. Left hemithorax volume loss with mediastinal shift to the left. There is consolidation in the left lung most apparent in the upper lung, as well as linear/reticular type opacities, stable from the prior study. On the right, patchy airspace opacity is noted in the mid and lower lung superimposed on chronic interstitial thickening. The lung is hyperexpanded. No pneumothorax. Stable left anterior chest wall AICD and right anterior chest wall Port-A-Cath. IMPRESSION: 1. New patchy airspace opacity in the right mid to lower lung superimposed on chronic interstitial thickening. New lung opacities are suspicious for superimposed pneumonia. 2. No other change from the prior exam. Extensive chronic changes as detailed. Electronically Signed   By: Lajean Manes M.D.   On: 03/29/2020 08:39   CT ANGIO CHEST PE W OR WO CONTRAST  Result Date: 04/16/2020 CLINICAL DATA:  59 year old male with history of metastatic lung cancer presenting with shortness of breath. Concern for pulmonary embolism. EXAM: CT ANGIOGRAPHY CHEST WITH CONTRAST TECHNIQUE: Multidetector CT imaging of the chest was performed using the standard protocol during bolus administration of intravenous contrast. Multiplanar CT image reconstructions and MIPs were obtained to evaluate the vascular anatomy. CONTRAST:  67mL OMNIPAQUE IOHEXOL 350 MG/ML SOLN COMPARISON:  Chest CT dated 03/30/2020. FINDINGS: Cardiovascular: Mild to moderate cardiomegaly. No pericardial effusion. Mild atherosclerotic calcification of the thoracic aorta. No aneurysmal dilatation. No acute pulmonary artery embolus identified. Mediastinum/Nodes: Hilar and mediastinal adenopathy as seen on the prior CT. Right hilar  lymph node measures 19 mm short axis. A lymph node anterior to the right mainstem bronchus measures 2 cm in short axis. Clusters of lymph nodes in the prevascular space as seen previously. There are supraclavicular adenopathy. No mediastinal fluid collection. Lungs/Pleura: There is consolidative changes of the left upper lobe with a large cavitary lesion similar to prior CT. Diffuse interstitial coarsening and background of emphysema. Right upper lobe scarring and a 3.9 x 3.9 cm subpleural mass in the medial right lower lobe extending into the right hilum as seen previously. There is 3.6 x 3.7 cm mass with irregular/spiculated margin in the right lower lobe (97/6) which is new since the prior CT. Trace bilateral pleural effusions. There is no pneumothorax. The central airways are patent. Upper Abdomen: No acute abnormality. Musculoskeletal: Lytic destructive metastasis involving C7, seen on the prior CT. No new osseous metastasis. No acute osseous pathology. Median sternotomy wires. There is loss of subcutaneous fat and cachexia. Review of the MIP images confirms the above findings. IMPRESSION: 1. No CT evidence of acute pulmonary artery embolus. 2. Consolidative changes of the left upper lobe with a large cavitary lesion containing intracavitary debris or fungal ball similar to prior CT. 3. Right lower lobe subpleural mass extending into the right hilum as seen previously. 4. An irregular/spiculated mass in the right lower lobe, new since the prior CT. 5. Lytic destructive metastasis involving C7, seen on the prior CT. No new osseous metastasis. 6. Trace bilateral pleural effusions. 7. Aortic Atherosclerosis (ICD10-I70.0) and Emphysema (ICD10-J43.9). Electronically Signed   By: Anner Crete M.D.   On: 04/16/2020 17:42   CT ANGIO CHEST PE W OR WO CONTRAST  Result  Date: 03/30/2020 CLINICAL DATA:  Pulmonary fibrosis, lung cancer, dyspnea EXAM: CT ANGIOGRAPHY CHEST WITH CONTRAST TECHNIQUE: Multidetector CT  imaging of the chest was performed using the standard protocol during bolus administration of intravenous contrast. Multiplanar CT image reconstructions and MIPs were obtained to evaluate the vascular anatomy. CONTRAST:  131mL OMNIPAQUE IOHEXOL 350 MG/ML SOLN COMPARISON:  01/06/2020 FINDINGS: Cardiovascular: There is excellent opacification of the pulmonary arterial tree. There is no intraluminal filling defect identified to suggest acute pulmonary embolism. The central pulmonary arteries are enlarged in keeping with changes of pulmonary arterial hypertension. Coronary artery bypass grafting has been performed with stenting within a left anterior descending saphenous vein graft. There is mild global cardiomegaly with moderate left ventricular dilation and superimposed mild left ventricular hypertrophy. No pericardial effusion. Left subclavian single lead pacemaker is seen with its lead within the right ventricle. Right internal jugular chest port tip is seen within the right atrium. The thoracic aorta demonstrates mild atherosclerotic calcification, but is otherwise unremarkable. Mediastinum/Nodes: Thyroid unremarkable. Pathologic pre-vascular mediastinal adenopathy is again identified exact measurement is difficult due to streak artifact from overlying pacemaker, however, the index lymph node demonstrates progressive enlargement, measuring 2.0 x 2.8 cm at axial image # 47/5. Right hilar adenopathy has progressed with the index lymph node within the right hilum measuring 1.7 x 2.1 cm at axial image # 63/5. thyroid unremarkable. Esophagus unremarkable. Lungs/Pleura: Severe, end-stage cystic lung disease within the left apex with associated left-sided volume loss is again identified with probable 5 cm aspergillosis a again identified at the left apex. Moderate severe centrilobular paraseptal emphysema. There is interval development of diffuse ground-glass pulmonary infiltrate throughout the residual pulmonary  parenchyma and new consolidation at the right lung base subpleurally, likely infectious in the appropriate clinical setting. Alternatively, diffuse infiltrate could relate to pulmonary edema with consolidation at the right lung base representing superimposed infection should or changes of radiation pneumonitis in the appropriate clinical setting. Pleural base mass at the right lung base demonstrates interval increase in size measuring 3.7 x 4.4 cm at axial image # 99/11 Upper Abdomen: Extensive reflux of contrast into the hepatic venous system secondary to at least some degree of right heart failure. No acute abnormality. Musculoskeletal: No acute bone abnormality. Review of the MIP images confirms the above findings. IMPRESSION: Interval development of diffuse ground-glass pulmonary infiltrate and right basilar subpleural consolidation. This may relate to diffuse infection or, more likely, diffuse pulmonary edema with superimposed infectious or post radiation change at the right lung base. 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 Superimposed moderate emphysema. Interval disease progression with enlargement of the neoplastic mass at the right lung base and developing right hilar adenopathy. Progressive pre-vascular adenopathy of unclear etiology. Moderate left ventricular dilation. Reflux of contrast into the hepatic venous vasculature in keeping with at least some degree of right heart failure. No pulmonary embolism. Aortic Atherosclerosis (ICD10-I70.0) and Emphysema (ICD10-J43.9). Electronically Signed   By: Fidela Salisbury MD   On: 03/30/2020 22:33   DG Chest Port 1 View  Result Date: 04/13/2020 CLINICAL DATA:  Dyspnea, lung cancer, pneumonia EXAM: PORTABLE CHEST 1 VIEW COMPARISON:  Multiple including 04/06/2020, 04/04/2020, and 03/18/2020 FINDINGS: Left-sided volume loss with multiple cystic lucencies are again identified at the left apex with superimposed consolidation  at the left apex, likely corresponding to the aspirate the lung a noted on prior CT examination of 03/30/2020. Progressive coarse interstitial pulmonary infiltrate throughout the right lung, likely infectious in etiology. Tiny right pleural  effusion now present. No pneumothorax. Medial right basilar spiculated mass is better visualized than on prior examination, likely corresponding to the primary malignancy. Right internal jugular chest port tip is unchanged within the superior cavoatrial junction. Moderate cardiomegaly with stenting of a saphenous vein bypass graft is again noted. Left subclavian single lead pacemaker defibrillator is unchanged. Median sternotomy has been performed. IMPRESSION: Progressive coarse interstitial infiltrate throughout the right lung most in keeping with progressive pneumonic infiltrate. 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 Stable spiculated neoplastic mass at the medial right lung base. Unchanged cardiomegaly. Electronically Signed   By: Fidela Salisbury MD   On: 04/13/2020 22:23   DG CHEST PORT 1 VIEW  Result Date: 04/04/2020 CLINICAL DATA:  Dyspnea, follow-up lung opacities, history of right lung cancer EXAM: PORTABLE CHEST 1 VIEW COMPARISON:  03/29/2020 chest radiograph. FINDINGS: Right internal jugular Port-A-Cath terminates over the cavoatrial junction. Stable configuration of single lead left subclavian ICD. Intact median sternotomy wires. Stable cardiomediastinal silhouette with mild cardiomegaly. No pneumothorax. Chronic bilateral costophrenic angle blunting. Chronic dense consolidation, volume loss and distortion in upper left lung with associated bronchiectasis and chronic mycetoma as seen on prior chest CT. Patchy opacity throughout both lungs, slightly worsened in the upper right lung and improved at the right lung base. Known spiculated right retrocardiac mass. IMPRESSION: 1.  Patchy opacity throughout both lungs, slightly worsened in the upper right lung and improved at the right lung base, favor multifocal pneumonia, with a component of pulmonary edema not excluded. 2. Known spiculated right retrocardiac lung mass. 3. Chronic dense consolidation, volume loss and distortion in the upper left lung with associated bronchiectasis and chronic mycetoma as seen on prior chest CT. Electronically Signed   By: Ilona Sorrel M.D.   On: 04/04/2020 15:58      Subjective: Pt says he feels ready to go to hospice now.    Discharge Exam: Vitals:   04/20/20 0809 04/20/20 1352  BP:    Pulse:    Resp:    Temp:    SpO2: 100% 92%   Vitals:   04/20/20 0753 04/20/20 0801 04/20/20 0809 04/20/20 1352  BP:      Pulse:      Resp:      Temp:      TempSrc:      SpO2: 99% 100% 100% 92%  Weight:      Height:        General:  Pt chronically ill appearing and emaciated, follows commands appropriately, not in acute distress  HEENT: neck supple, no JVD, no masses or lymphadenopathy  Cardiovascular: normal S1/S2 sounds, no rubs, no gallops  Respiratory: poor air movement, diffuse crackles and exp wheezes heard.  Abdomen: ND/NT, no mass palpated.   Extremities: no C/C/E.   Neuro: nonfocal exam.   The results of significant diagnostics from this hospitalization (including imaging, microbiology, ancillary and laboratory) are listed below for reference.     Microbiology: Recent Results (from the past 240 hour(s))  Resp Panel by RT PCR (RSV, Flu A&B, Covid) - Nasopharyngeal Swab     Status: None   Collection Time: 04/13/20  9:48 PM   Specimen: Nasopharyngeal Swab  Result Value Ref Range Status   SARS Coronavirus 2 by RT PCR NEGATIVE NEGATIVE Final    Comment: (NOTE) SARS-CoV-2 target nucleic acids are NOT DETECTED.  The SARS-CoV-2 RNA is generally detectable in upper respiratoy specimens during the acute phase of  infection. The lowest concentration of SARS-CoV-2 viral  copies this assay can detect is 131 copies/mL. A negative result does not preclude SARS-Cov-2 infection and should not be used as the sole basis for treatment or other patient management decisions. A negative result may occur with  improper specimen collection/handling, submission of specimen other than nasopharyngeal swab, presence of viral mutation(s) within the areas targeted by this assay, and inadequate number of viral copies (<131 copies/mL). A negative result must be combined with clinical observations, patient history, and epidemiological information. The expected result is Negative.  Fact Sheet for Patients:  PinkCheek.be  Fact Sheet for Healthcare Providers:  GravelBags.it  This test is no t yet approved or cleared by the [place] FDA and  has been authorized for detection and/or diagnosis of SARS-CoV-2 by FDA under an Emergency Use Authorization (EUA). This EUA will remain  in effect (meaning this test can be used) for the duration of the COVID-19 declaration under Section 564(b)(1) of the Act, 21 U.S.C. section 360bbb-3(b)(1), unless the authorization is terminated or revoked sooner.     Influenza A by PCR NEGATIVE NEGATIVE Final   Influenza B by PCR NEGATIVE NEGATIVE Final    Comment: (NOTE) The Xpert Xpress SARS-CoV-2/FLU/RSV assay is intended as an aid in  the diagnosis of influenza from Nasopharyngeal swab specimens and  should not be used as a sole basis for treatment. Nasal washings and  aspirates are unacceptable for Xpert Xpress SARS-CoV-2/FLU/RSV  testing.  Fact Sheet for Patients: PinkCheek.be  Fact Sheet for Healthcare Providers: GravelBags.it  This test is not yet approved or cleared by the [place] FDA and  has been authorized for detection and/or diagnosis of SARS-CoV-2 by  FDA under an Emergency Use Authorization (EUA). This  EUA will remain  in effect (meaning this test can be used) for the duration of the  Covid-19 declaration under Section 564(b)(1) of the Act, 21  U.S.C. section 360bbb-3(b)(1), unless the authorization is  terminated or revoked.    Respiratory Syncytial Virus by PCR NEGATIVE NEGATIVE Final    Comment: (NOTE) Fact Sheet for Patients: PinkCheek.be  Fact Sheet for Healthcare Providers: GravelBags.it  This test is not yet approved or cleared by the [place] FDA and  has been authorized for detection and/or diagnosis of SARS-CoV-2 by  FDA under an Emergency Use Authorization (EUA). This EUA will remain  in effect (meaning this test can be used) for the duration of the  COVID-19 declaration under Section 564(b)(1) of the Act, 21 U.S.C.  section 360bbb-3(b)(1), unless the authorization is terminated or  revoked. Performed at [facility], [address], [place], [place] [address]   MRSA PCR Screening     Status: None   Collection Time: 04/16/20 11:31 AM   Specimen: Nasal Mucosa; Nasopharyngeal  Result Value Ref Range Status   MRSA by PCR NEGATIVE NEGATIVE Final    Comment:        The GeneXpert MRSA Assay (FDA approved for NASAL specimens only), is one component of a comprehensive MRSA colonization surveillance program. It is not intended to diagnose MRSA infection nor to guide or monitor treatment for MRSA infections. Performed at [facility], [address] [place], [place] [address]      Labs: BNP (last 3 results) Recent Labs    04/01/20 0615 04/13/20 2148 04/18/20 0637  BNP 4,414.0* 2,052.0* 2,376.2*   Basic Metabolic Panel: Recent Labs  Lab 04/16/20 0450 04/17/20 0505 04/18/20 0637 04/19/20 0928 04/20/20 0630  NA 131* 131* 132*  134* 137  K 4.4 4.7 4.7 4.0 4.1  CL 86* 87* 90* 92* 92*  CO2 34* 32 33* 31 36*  GLUCOSE 184* 200* 173* 289* 118*  BUN 41* 51* 60* 48* 45*  CREATININE 1.08 1.49* 1.68*  1.62* 1.15  CALCIUM 9.3 9.8 9.5 9.4 9.6  MG  --   --  1.7 1.7 1.5*   Liver Function Tests: Recent Labs  Lab 04/13/20 2148 04/17/20 0505  AST 11* 13*  ALT 12 12  ALKPHOS 69 98  BILITOT 0.4 0.3  PROT 7.3 7.7  ALBUMIN 2.6* 2.7*   No results for input(s): LIPASE, AMYLASE in the last 168 hours. No results for input(s): AMMONIA in the last 168 hours. CBC: Recent Labs  Lab 04/13/20 2148 04/16/20 0450 04/17/20 0505 04/18/20 0637 04/19/20 0928  WBC 10.8* 10.5 13.9* 14.3* 12.0*  NEUTROABS 8.1*  --   --   --   --   HGB 7.4* 7.1* 7.1* 6.7* 9.3*  HCT 23.4* 23.6* 23.4* 21.8* 30.3*  MCV 93.2 95.2 96.3 97.3 95.0  PLT 361 440* 448* 403* 363   Cardiac Enzymes: No results for input(s): CKTOTAL, CKMB, CKMBINDEX, TROPONINI in the last 168 hours. BNP: Invalid input(s): POCBNP CBG: No results for input(s): GLUCAP in the last 168 hours. D-Dimer No results for input(s): DDIMER in the last 72 hours. Hgb A1c No results for input(s): HGBA1C in the last 72 hours. Lipid Profile No results for input(s): CHOL, HDL, LDLCALC, TRIG, CHOLHDL, LDLDIRECT in the last 72 hours. Thyroid function studies No results for input(s): TSH, T4TOTAL, T3FREE, THYROIDAB in the last 72 hours.  Invalid input(s): FREET3 Anemia work up No results for input(s): VITAMINB12, FOLATE, FERRITIN, TIBC, IRON, RETICCTPCT in the last 72 hours. Urinalysis    Component Value Date/Time   COLORURINE YELLOW 04/14/2020 0450   APPEARANCEUR CLEAR 04/14/2020 0450   LABSPEC 1.013 04/14/2020 0450   PHURINE 5.0 04/14/2020 0450   GLUCOSEU NEGATIVE 04/14/2020 0450   HGBUR NEGATIVE 04/14/2020 0450   BILIRUBINUR NEGATIVE 04/14/2020 0450   KETONESUR NEGATIVE 04/14/2020 0450   PROTEINUR NEGATIVE 04/14/2020 0450   UROBILINOGEN 0.2 08/06/2013 1436   NITRITE NEGATIVE 04/14/2020 0450   LEUKOCYTESUR NEGATIVE 04/14/2020 0450   Sepsis Labs Invalid input(s): PROCALCITONIN,  WBC,  LACTICIDVEN Microbiology Recent Results (from the past 240  hour(s))  Resp Panel by RT PCR (RSV, Flu A&B, Covid) - Nasopharyngeal Swab     Status: None   Collection Time: 04/13/20  9:48 PM   Specimen: Nasopharyngeal Swab  Result Value Ref Range Status   SARS Coronavirus 2 by RT PCR NEGATIVE NEGATIVE Final    Comment: (NOTE) SARS-CoV-2 target nucleic acids are NOT DETECTED.  The SARS-CoV-2 RNA is generally detectable in upper respiratoy specimens during the acute phase of infection. The lowest concentration of SARS-CoV-2 viral copies this assay can detect is 131 copies/mL. A negative result does not preclude SARS-Cov-2 infection and should not be used as the sole basis for treatment or other patient management decisions. A negative result may occur with  improper specimen collection/handling, submission of specimen other than nasopharyngeal swab, presence of viral mutation(s) within the areas targeted by this assay, and inadequate number of viral copies (<131 copies/mL). A negative result must be combined with clinical observations, patient history, and epidemiological information. The expected result is Negative.  Fact Sheet for Patients:  PinkCheek.be  Fact Sheet for Healthcare Providers:  GravelBags.it  This test is no t yet approved or cleared by the [place] and  has been authorized for detection and/or diagnosis of SARS-CoV-2 by FDA under an Emergency Use Authorization (EUA). This EUA will remain  in effect (meaning this test can be used) for the duration of the COVID-19 declaration under Section 564(b)(1) of the Act, 21 U.S.C. section 360bbb-3(b)(1), unless the authorization is terminated or revoked sooner.     Influenza A by PCR NEGATIVE NEGATIVE Final   Influenza B by PCR NEGATIVE NEGATIVE Final    Comment: (NOTE) The Xpert Xpress SARS-CoV-2/FLU/RSV assay is intended as an aid in  the diagnosis of influenza from Nasopharyngeal swab specimens and  should not be  used as a sole basis for treatment. Nasal washings and  aspirates are unacceptable for Xpert Xpress SARS-CoV-2/FLU/RSV  testing.  Fact Sheet for Patients: PinkCheek.be  Fact Sheet for Healthcare Providers: GravelBags.it  This test is not yet approved or cleared by the [place] FDA and  has been authorized for detection and/or diagnosis of SARS-CoV-2 by  FDA under an Emergency Use Authorization (EUA). This EUA will remain  in effect (meaning this test can be used) for the duration of the  Covid-19 declaration under Section 564(b)(1) of the Act, 21  U.S.C. section 360bbb-3(b)(1), unless the authorization is  terminated or revoked.    Respiratory Syncytial Virus by PCR NEGATIVE NEGATIVE Final    Comment: (NOTE) Fact Sheet for Patients: PinkCheek.be  Fact Sheet for Healthcare Providers: GravelBags.it  This test is not yet approved or cleared by the [place] FDA and  has been authorized for detection and/or diagnosis of SARS-CoV-2 by  FDA under an Emergency Use Authorization (EUA). This EUA will remain  in effect (meaning this test can be used) for the duration of the  COVID-19 declaration under Section 564(b)(1) of the Act, 21 U.S.C.  section 360bbb-3(b)(1), unless the authorization is terminated or  revoked. Performed at [facility], [address], [place], [place] [address]   MRSA PCR Screening     Status: None   Collection Time: 04/16/20 11:31 AM   Specimen: Nasal Mucosa; Nasopharyngeal  Result Value Ref Range Status   MRSA by PCR NEGATIVE NEGATIVE Final    Comment:        The GeneXpert MRSA Assay (FDA approved for NASAL specimens only), is one component of a comprehensive MRSA colonization surveillance program. It is not intended to diagnose MRSA infection nor to guide or monitor treatment for MRSA infections. Performed at [facility],  [address], [place], [place] [address]    Time coordinating discharge: 33 mins  SIGNED:  Irwin Brakeman, MD  [facility] 04/20/2020, 2:38 PM How to contact the [facility] Attending or Consulting provider [place] or covering provider during after hours [place], for this patient?  1. Check the care team in [facility] and look for a) attending/consulting TRH provider listed and b) the [facility] team listed 2. Log into [email or web address] and use [place]'s universal password to access. If you do not have the password, please contact the hospital operator. 3. Locate the [facility] provider you are looking for under [facility] and page to a number that you can be directly reached. 4. If you still have difficulty reaching the provider, please page the [facility] (Director on Call) for the Hospitalists listed on amion for assistance.

## 2020-04-20 NOTE — Progress Notes (Signed)
Port deaccessed with no complications. Vaseline dressing applied.

## 2020-04-20 NOTE — TOC Transition Note (Signed)
Transition of Care [facility]) - CM/SW Discharge Note   Patient Details  Name: [name] MRN: [id number] Date of Birth: January 15, 1961  Transition of [facility]) CM/SW Contact:  Salome Arnt, [place] Phone Number: 04/20/2020, 3:37 PM   Clinical Narrative:  After discussion with palliative today, pt agreeable to comfort measures and requests transfer to [facility] residential hospice [facility]). Referral made and bed available today. Pt will transport via [facility] which LCSW will arrange. COVID test pending- hospice aware. RN given number to call report. Pt's mother updated and states that pt's daughter will be signing paperwork at hospice. D/C summary faxed.      Final next level of care: [place] Barriers to Discharge: Barriers Resolved   Patient Goals and CMS Choice Patient states their goals for this hospitalization and ongoing recovery are:: Discharge with hospice CMS Medicare.gov Compare Post Acute Care list provided to:: Patient Choice offered to / list presented to : Patient  Discharge Placement                Patient to be transferred to facility by: [facility] Name of family member notified: [place] Patient and family notified of of transfer: 04/20/20  Discharge Plan and Services In-house Referral: Clinical Social Work Discharge Planning Services: NA Post Acute Care Choice: Hospice          DME Arranged: N/A DME Agency: NA       HH Arranged: NA HH Agency: NA        Social Determinants of Health ([place]) Interventions     Readmission Risk Interventions Readmission Risk Prevention Plan 03/28/2020  Transportation Screening Complete  HRI or [place] Complete  Social Work Consult for [place] Planning/Counseling Complete  Palliative Care Screening Not Applicable  Medication Review Press photographer) Complete  Some recent data might be hidden

## 2020-04-20 NOTE — Progress Notes (Signed)
Report called to [place]ery [place] at [facility]

## 2020-04-22 ENCOUNTER — Encounter: Payer: Self-pay | Admitting: *Deleted

## 2020-04-22 NOTE — Progress Notes (Signed)
I followed up on Mr. Whites's schedule.  EMR notes state he was released and admitted to [facility]. I called them and he was admitted there on 11/22.  I updated med oncology team.

## 2020-05-11 ENCOUNTER — Ambulatory Visit: Payer: Medicare HMO | Admitting: Cardiology

## 2020-05-28 ENCOUNTER — Telehealth (HOSPITAL_COMMUNITY): Payer: Self-pay | Admitting: Licensed Clinical Social Worker

## 2020-05-28 NOTE — Telephone Encounter (Signed)
CSW had reminder in to help pt with transportation to [facility] next week.  After completing chart review saw that pt was DC'd last month to [facility] in [place] so [place] called their hospice house to check on pt status.  Pt has passed away so CSW sent message to [facility] CSW to inform staff.  Jorge Ny, LCSW Clinical Social Worker [facility] Desk#: [phone] Cell#: [phone]

## 2020-05-30 DEATH — deceased

## 2020-06-02 ENCOUNTER — Ambulatory Visit: Payer: Medicare HMO | Admitting: General Practice
